# Patient Record
Sex: Female | Born: 1940
Health system: Southern US, Community
[De-identification: ages and names within clinical notes are randomized; demographics above are authoritative.]

## PROBLEM LIST (undated history)

## (undated) DIAGNOSIS — F32A Depression, unspecified: Secondary | ICD-10-CM

## (undated) DIAGNOSIS — G8929 Other chronic pain: Secondary | ICD-10-CM

## (undated) DIAGNOSIS — J449 Chronic obstructive pulmonary disease, unspecified: Secondary | ICD-10-CM

## (undated) DIAGNOSIS — I2510 Atherosclerotic heart disease of native coronary artery without angina pectoris: Secondary | ICD-10-CM

## (undated) DIAGNOSIS — M1990 Unspecified osteoarthritis, unspecified site: Secondary | ICD-10-CM

## (undated) DIAGNOSIS — Z973 Presence of spectacles and contact lenses: Secondary | ICD-10-CM

## (undated) DIAGNOSIS — Z974 Presence of external hearing-aid: Secondary | ICD-10-CM

## (undated) DIAGNOSIS — K219 Gastro-esophageal reflux disease without esophagitis: Secondary | ICD-10-CM

## (undated) DIAGNOSIS — U071 COVID-19: Secondary | ICD-10-CM

## (undated) DIAGNOSIS — C8590 Non-Hodgkin lymphoma, unspecified, unspecified site: Secondary | ICD-10-CM

## (undated) DIAGNOSIS — I4891 Unspecified atrial fibrillation: Secondary | ICD-10-CM

## (undated) DIAGNOSIS — Z972 Presence of dental prosthetic device (complete) (partial): Secondary | ICD-10-CM

## (undated) DIAGNOSIS — R591 Generalized enlarged lymph nodes: Secondary | ICD-10-CM

## (undated) DIAGNOSIS — I219 Acute myocardial infarction, unspecified: Secondary | ICD-10-CM

## (undated) DIAGNOSIS — I10 Essential (primary) hypertension: Secondary | ICD-10-CM

## (undated) DIAGNOSIS — E7800 Pure hypercholesterolemia, unspecified: Secondary | ICD-10-CM

## (undated) DIAGNOSIS — H409 Unspecified glaucoma: Secondary | ICD-10-CM

## (undated) DIAGNOSIS — M549 Dorsalgia, unspecified: Secondary | ICD-10-CM

## (undated) DIAGNOSIS — D649 Anemia, unspecified: Secondary | ICD-10-CM

## (undated) DIAGNOSIS — F419 Anxiety disorder, unspecified: Secondary | ICD-10-CM

## (undated) DIAGNOSIS — R7303 Prediabetes: Secondary | ICD-10-CM

## (undated) HISTORY — PX: CORONARY STENT PLACEMENT: SHX1402

## (undated) HISTORY — DX: COVID-19: U07.1

## (undated) HISTORY — PX: CATARACT EXTRACTION W/ INTRAOCULAR LENS IMPLANT: SHX1309

## (undated) HISTORY — PX: CARDIAC CATHETERIZATION: SHX172

## (undated) HISTORY — PX: COLONOSCOPY W/ BIOPSIES AND POLYPECTOMY: SHX1376

## (undated) HISTORY — PX: CHOLECYSTECTOMY: SHX55

## (undated) HISTORY — PX: SPINE SURGERY: SHX786

## (undated) NOTE — *Deleted (*Deleted)
HEMATOLOGY/ONCOLOGY CLINIC NOTE  Date of Service: 01/01/2019   Patient Care Team: Loren Racer, MD as PCP - General Physical medicine and rehabilitation: Merri Ray DO  CHIEF COMPLAINTS/PURPOSE OF CONSULTATION:  F/u for follicular lymphoma  INTERVAL HISTORY: Samantha [name] is here for management and evaluation of her follicular lymphoma. The patient's last visit with [place] was on 07/24/2019. The pt reports that she is doing well overall.  The pt reports ***  Of note since the patient's last visit, pt has had *** completed on *** with results revealing ***.  Lab results today (01/22/20) of CBC w/diff and CMP is as follows: all values are WNL except for ***. 01/25/2020 LDH at ***  On review of systems, pt reports *** and denies ***and any other symptoms.   A&P: -Discussed pt labwork today, 01/22/20; *** -***  MEDICAL HISTORY:  Past Medical History:  Diagnosis Date  . A-fib (HCC)   . Arthritis   . Chronic back pain   . COPD (chronic obstructive pulmonary disease) (HCC)   . Coronary artery disease   . Glaucoma   . Hearing aid worn   . High cholesterol   . Hypertension   . Lymphadenopathy 01/25/2016  . Myocardial infarction (HCC)    2000  . Non Hodgkin's lymphoma (HCC)   . Wears dentures    " Top plate"  . Wears glasses   #1 bilateral cataracts #2 COPD  #3 active smoker half a pack per day for about 60 years #4 degenerative arthritis - has had chronic back pain issues with multiple epidural steroid injections from 2014 [5 times] #5 glaucoma #6 degenerative arthritis #7 history of TIA #8 dyslipidemia #9 hypertension #10 coronary artery disease with inferior wall MI in the past, status post PCI to RCA in 2001 #11 hypothyroidism #12 lumbar radiculopathy/neuropathy. #13 paroxysmal atrial fibrillation on anticoagulation with Eliquis. #14 type 2 diabetes #16 obesity  SURGICAL HISTORY:  #1 cardiac catheterization #2 cholecystectomy #3 colonoscopy #4  wisdom teeth extraction.  SOCIAL HISTORY: Currently single. Notes that she lost her female partner 9 months ago. Active smoker half pack per day since her teens  FAMILY HISTORY: Patient reports no history of cancers or blood disorders in the family that she is aware of.  ALLERGIES:  is allergic to hydrocodone and oxycodone.  MEDICATIONS:  Current Outpatient Medications  Medication Sig Dispense Refill  . ALPRAZolam (XANAX) 0.25 MG tablet Take 0.25 mg by mouth daily as needed for anxiety.     Marland Kitchen amiodarone (PACERONE) 200 MG tablet Take 100 mg by mouth every morning.    Marland Kitchen amLODipine (NORVASC) 5 MG tablet Take 2.5 mg by mouth daily.    Marland Kitchen atorvastatin (LIPITOR) 40 MG tablet Take 40 mg by mouth every morning.     Marland Kitchen ELIQUIS 5 MG TABS tablet Take 5 mg by mouth 2 (two) times daily.    . fluticasone (FLONASE) 50 MCG/ACT nasal spray Place 2 sprays into both nostrils daily. 16 g 3  . gabapentin (NEURONTIN) 300 MG capsule Take 300 mg by mouth 2 (two) times daily as needed (neuropathy).     . metoprolol succinate (TOPROL-XL) 50 MG 24 hr tablet Take 50 mg by mouth daily.    . nitroGLYCERIN (NITROSTAT) 0.4 MG SL tablet Place 0.4 mg under the tongue every 5 (five) minutes x 3 doses as needed for chest pain.     . pantoprazole (PROTONIX) 40 MG tablet TAKE 1 TABLET BY MOUTH EVERY DAY 90 tablet 0  .  sertraline (ZOLOFT) 50 MG tablet TAKE 1 TABLET BY MOUTH EVERYDAY AT BEDTIME 90 tablet 3  . timolol (TIMOPTIC) 0.5 % ophthalmic solution Place 1 drop into both eyes 2 (two) times daily. Instill 1 drop in each eye in the morning and one drop in each eye at night    . traMADol (ULTRAM) 50 MG tablet Take 1-2 tablets (50-100 mg total) by mouth every 6 (six) hours as needed. 30 tablet 1   No current facility-administered medications for this visit.    REVIEW OF SYSTEMS:   A 10+ POINT REVIEW OF SYSTEMS WAS OBTAINED including neurology, dermatology, psychiatry, cardiac, respiratory, lymph, extremities, GI, GU,  Musculoskeletal, constitutional, breasts, reproductive, HEENT.  All pertinent positives are noted in the HPI.  All others are negative.   PHYSICAL EXAMINATION:  ECOG FS:2 - Symptomatic, <50% confined to bed  There were no vitals filed for this visit. Wt Readings from Last 3 Encounters:  07/24/19 216 lb 9.6 oz (98.2 kg)  01/01/19 211 lb 9.6 oz (96 kg)  09/01/18 213 lb 8 oz (96.8 kg)   There is no height or weight on file to calculate BMI.    *** GENERAL:alert, in no acute distress and comfortable SKIN: no acute rashes, no significant lesions EYES: conjunctiva are pink and non-injected, sclera anicteric OROPHARYNX: MMM, no exudates, no oropharyngeal erythema or ulceration NECK: supple, no JVD LYMPH:  no palpable lymphadenopathy in the cervical, axillary or inguinal regions LUNGS: clear to auscultation b/l with normal respiratory effort HEART: regular rate & rhythm ABDOMEN:  normoactive bowel sounds , non tender, not distended. No palpable hepatosplenomegaly.  Extremity: no pedal edema PSYCH: alert & oriented x 3 with fluent speech NEURO: no focal motor/sensory deficits  LABORATORY DATA:  I have reviewed the data as listed  . CBC Latest Ref Rng & Units 07/24/2019 01/01/2019 09/01/2018  WBC 4.0 - 10.5 K/uL 7.6 7.1 8.1  Hemoglobin 12.0 - 15.0 g/dL 45.4 09.8 11.9  Hematocrit 36 - 46 % 41.5 40.8 41.3  Platelets 150 - 400 K/uL 225 217 205   . CBC    Component Value Date/Time   WBC 7.6 07/24/2019 0933   RBC 4.32 07/24/2019 0933   HGB 13.0 07/24/2019 0933   HGB 13.9 02/18/2018 1116   HGB 12.7 03/04/2017 1349   HCT 41.5 07/24/2019 0933   HCT 39.8 03/04/2017 1349   PLT 225 07/24/2019 0933   PLT 196 02/18/2018 1116   PLT 180 03/04/2017 1349   MCV 96.1 07/24/2019 0933   MCV 99.0 03/04/2017 1349   MCH 30.1 07/24/2019 0933   MCHC 31.3 07/24/2019 0933   RDW 13.7 07/24/2019 0933   RDW 12.9 03/04/2017 1349   LYMPHSABS 1.6 07/24/2019 0933   LYMPHSABS 1.9 03/04/2017 1349   MONOABS  0.7 07/24/2019 0933   MONOABS 0.6 03/04/2017 1349   EOSABS 0.3 07/24/2019 0933   EOSABS 0.2 03/04/2017 1349   BASOSABS 0.1 07/24/2019 0933   BASOSABS 0.0 03/04/2017 1349    . CMP Latest Ref Rng & Units 07/24/2019 01/01/2019 09/01/2018  Glucose 70 - 99 mg/dL 147(W) 295(A) 88  BUN 8 - 23 mg/dL 9 10 9   Creatinine 0.44 - 1.00 mg/dL 2.13 0.86 5.78  Sodium 135 - 145 mmol/L 144 144 142  Potassium 3.5 - 5.1 mmol/L 4.3 3.9 4.5  Chloride 98 - 111 mmol/L 104 104 106  CO2 22 - 32 mmol/L 28 29 27   Calcium 8.9 - 10.3 mg/dL 8.9 9.1 4.6(N)  Total Protein 6.5 - 8.1 g/dL 6.7  6.7 6.6  Total Bilirubin 0.3 - 1.2 mg/dL 0.5 0.4 0.4  Alkaline Phos 38 - 126 U/L 69 80 73  AST 15 - 41 U/L 20 19 19   ALT 0 - 44 U/L 12 11 18    . Lab Results  Component Value Date   LDH 172 07/24/2019    .          RADIOGRAPHIC STUDIES: I have personally reviewed the radiological images as listed and agreed with the findings in the report. No results found.  PET/CT 12/22/2015  IMPRESSION: 1. Hypermetabolic confluent lower retroperitoneal / bilateral common iliac, right external iliac and right inguinal lymphadenopathy, consistent with lymphoma. 2. Solitary focus of osseous hypermetabolism in the left upper sacrum consistent with osseous involvement by lymphoma. 3. No metabolically active lymphoma in the neck or chest. Spleen is normal in size and metabolism. 4. Additional findings include aortic atherosclerosis, three-vessel coronary atherosclerosis and mild sigmoid diverticulosis.   Electronically Signed   By: Delbert Phenix M.D.   On: 12/22/2015 17:21   ASSESSMENT & PLAN:   48 y.o. Caucasian female with multiple chronic medical comorbidities as noted above with   #1 Stage IVB Low grade Follicular Lymphoma with extensive retroperitoneal lymphadenopathy and right inguinal lymphadenopathy as well as osseous involvement of the sacrum. LDH level within normal limits.  S/p 5 cycles of BR (Bendamustine  70mg /m2)  PET CT scan after 5 cycles of Bendamustine and Rituxan done on 09/06/2016 shows no residual metabolic active disease and no evidence of disease progression.  CT C/A/P on 05/20/17 reveals no radiographic evidence of disease progression.   02/19/18 CT C/A/P revealed Stable mild retroperitoneal adenopathy, compatible with treated lymphoma. No new or progressive adenopathy. No new sites of disease. Normal size spleen. 2. Three-vessel coronary atherosclerosis. 3. Aortic Atherosclerosis.  #2 Rt inguinal LN biopsy site infection and seroma -now resolved  #3 New persistent hoarseness/change in voice - being followed by ENT in [place]   PLAN: *** -No lab or clinical evidence of follicular lymphoma progression at this time. *** -F/u with PCP for heart burns/ acid suppressants- consider Gastroenterologist for endoscopy      FOLLOW UP: ***  The total time spent in the appt was *** minutes and more than 50% was on counseling and direct patient cares.  All of the patient's questions were answered with apparent satisfaction. The patient knows to call the clinic with any problems, questions or concerns.   Wyvonnia Lora MD MS AAHIVMS [facility] Hematology/Oncology Physician [facility]  (Office):       [phone] (Work cell):  [phone] (Fax):           [phone]  I, Carollee Herter, am acting as a scribe for Dr. Wyvonnia Lora.   {Add Production assistant, radio Statement}

---

## 2000-01-21 ENCOUNTER — Inpatient Hospital Stay (HOSPITAL_COMMUNITY): Admission: EM | Admit: 2000-01-21 | Discharge: 2000-01-24 | Payer: Self-pay | Admitting: Emergency Medicine

## 2000-01-21 ENCOUNTER — Encounter: Payer: Self-pay | Admitting: Cardiology

## 2000-01-21 ENCOUNTER — Encounter: Payer: Self-pay | Admitting: Cardiovascular Disease

## 2000-06-17 ENCOUNTER — Ambulatory Visit (HOSPITAL_COMMUNITY): Admission: RE | Admit: 2000-06-17 | Discharge: 2000-06-17 | Payer: Self-pay | Admitting: Cardiology

## 2000-06-17 ENCOUNTER — Encounter: Payer: Self-pay | Admitting: Cardiology

## 2000-06-20 ENCOUNTER — Ambulatory Visit (HOSPITAL_COMMUNITY): Admission: RE | Admit: 2000-06-20 | Discharge: 2000-06-20 | Payer: Self-pay | Admitting: Cardiology

## 2000-06-20 ENCOUNTER — Encounter: Payer: Self-pay | Admitting: Cardiovascular Disease

## 2000-11-25 ENCOUNTER — Encounter: Payer: Self-pay | Admitting: Family

## 2000-11-25 LAB — HM COLONOSCOPY

## 2001-01-08 ENCOUNTER — Ambulatory Visit (HOSPITAL_COMMUNITY): Admission: RE | Admit: 2001-01-08 | Discharge: 2001-01-08 | Payer: Self-pay | Admitting: Cardiology

## 2001-01-08 ENCOUNTER — Encounter: Payer: Self-pay | Admitting: Cardiology

## 2001-01-14 ENCOUNTER — Ambulatory Visit (HOSPITAL_COMMUNITY): Admission: RE | Admit: 2001-01-14 | Discharge: 2001-01-14 | Payer: Self-pay | Admitting: Cardiology

## 2001-01-14 ENCOUNTER — Encounter: Payer: Self-pay | Admitting: Cardiovascular Disease

## 2001-01-14 ENCOUNTER — Encounter: Payer: Self-pay | Admitting: Cardiology

## 2002-02-02 ENCOUNTER — Encounter: Payer: Self-pay | Admitting: Cardiology

## 2002-02-02 ENCOUNTER — Ambulatory Visit (HOSPITAL_COMMUNITY): Admission: RE | Admit: 2002-02-02 | Discharge: 2002-02-02 | Payer: Self-pay | Admitting: Cardiology

## 2002-06-17 ENCOUNTER — Ambulatory Visit (HOSPITAL_COMMUNITY): Admission: RE | Admit: 2002-06-17 | Discharge: 2002-06-17 | Payer: Self-pay | Admitting: Cardiology

## 2002-06-17 ENCOUNTER — Encounter: Payer: Self-pay | Admitting: Cardiology

## 2002-07-08 ENCOUNTER — Emergency Department (HOSPITAL_COMMUNITY): Admission: EM | Admit: 2002-07-08 | Discharge: 2002-07-08 | Payer: Self-pay | Admitting: Emergency Medicine

## 2004-10-12 ENCOUNTER — Observation Stay: Payer: Self-pay | Admitting: Internal Medicine

## 2004-10-12 ENCOUNTER — Other Ambulatory Visit: Payer: Self-pay

## 2004-11-29 ENCOUNTER — Ambulatory Visit: Payer: Self-pay | Admitting: Physician Assistant

## 2004-12-13 ENCOUNTER — Ambulatory Visit: Payer: Self-pay | Admitting: Internal Medicine

## 2004-12-21 ENCOUNTER — Ambulatory Visit: Payer: Self-pay | Admitting: Anesthesiology

## 2005-01-18 ENCOUNTER — Ambulatory Visit: Payer: Self-pay | Admitting: Anesthesiology

## 2005-02-15 ENCOUNTER — Ambulatory Visit: Payer: Self-pay | Admitting: Anesthesiology

## 2005-03-26 ENCOUNTER — Encounter: Payer: Self-pay | Admitting: Cardiovascular Disease

## 2005-03-26 ENCOUNTER — Ambulatory Visit (HOSPITAL_COMMUNITY): Admission: RE | Admit: 2005-03-26 | Discharge: 2005-03-26 | Payer: Self-pay | Admitting: Cardiology

## 2005-03-28 ENCOUNTER — Ambulatory Visit: Payer: Self-pay | Admitting: Anesthesiology

## 2005-05-15 ENCOUNTER — Ambulatory Visit: Payer: Self-pay | Admitting: Anesthesiology

## 2005-05-22 ENCOUNTER — Ambulatory Visit: Payer: Self-pay | Admitting: Anesthesiology

## 2005-06-20 ENCOUNTER — Ambulatory Visit: Payer: Self-pay | Admitting: Anesthesiology

## 2005-08-21 ENCOUNTER — Ambulatory Visit: Payer: Self-pay | Admitting: Anesthesiology

## 2005-11-07 ENCOUNTER — Ambulatory Visit: Payer: Self-pay | Admitting: Anesthesiology

## 2005-11-20 ENCOUNTER — Ambulatory Visit: Payer: Self-pay | Admitting: Anesthesiology

## 2005-12-11 ENCOUNTER — Ambulatory Visit: Payer: Self-pay | Admitting: Anesthesiology

## 2005-12-14 ENCOUNTER — Ambulatory Visit: Payer: Self-pay | Admitting: Internal Medicine

## 2006-04-18 ENCOUNTER — Ambulatory Visit: Payer: Self-pay | Admitting: Anesthesiology

## 2006-05-15 ENCOUNTER — Ambulatory Visit: Payer: Self-pay | Admitting: Anesthesiology

## 2006-10-01 ENCOUNTER — Ambulatory Visit: Payer: Self-pay | Admitting: Anesthesiology

## 2006-12-10 ENCOUNTER — Ambulatory Visit: Payer: Self-pay | Admitting: Anesthesiology

## 2006-12-17 ENCOUNTER — Ambulatory Visit: Payer: Self-pay | Admitting: Internal Medicine

## 2007-02-10 ENCOUNTER — Ambulatory Visit: Payer: Self-pay | Admitting: Anesthesiology

## 2007-05-06 ENCOUNTER — Ambulatory Visit: Payer: Self-pay | Admitting: Anesthesiology

## 2007-09-01 ENCOUNTER — Ambulatory Visit: Payer: Self-pay | Admitting: Anesthesiology

## 2007-11-28 ENCOUNTER — Ambulatory Visit: Payer: Self-pay | Admitting: Anesthesiology

## 2007-12-18 ENCOUNTER — Ambulatory Visit: Payer: Self-pay | Admitting: Internal Medicine

## 2008-01-22 ENCOUNTER — Ambulatory Visit: Payer: Self-pay | Admitting: Anesthesiology

## 2008-02-18 ENCOUNTER — Ambulatory Visit: Payer: Self-pay | Admitting: Anesthesiology

## 2008-03-29 ENCOUNTER — Ambulatory Visit: Payer: Self-pay | Admitting: Anesthesiology

## 2008-04-05 ENCOUNTER — Ambulatory Visit: Payer: Self-pay | Admitting: Pain Medicine

## 2008-04-28 ENCOUNTER — Ambulatory Visit: Payer: Self-pay | Admitting: Pain Medicine

## 2008-06-01 ENCOUNTER — Ambulatory Visit: Payer: Self-pay | Admitting: Physician Assistant

## 2008-07-08 ENCOUNTER — Ambulatory Visit: Payer: Self-pay | Admitting: Anesthesiology

## 2008-07-27 ENCOUNTER — Inpatient Hospital Stay (HOSPITAL_BASED_OUTPATIENT_CLINIC_OR_DEPARTMENT_OTHER): Admission: RE | Admit: 2008-07-27 | Discharge: 2008-07-27 | Payer: Self-pay | Admitting: Cardiology

## 2008-08-30 ENCOUNTER — Ambulatory Visit: Payer: Self-pay | Admitting: Anesthesiology

## 2008-11-03 ENCOUNTER — Ambulatory Visit: Payer: Self-pay | Admitting: Anesthesiology

## 2008-12-30 ENCOUNTER — Ambulatory Visit: Payer: Self-pay | Admitting: Anesthesiology

## 2009-01-24 ENCOUNTER — Ambulatory Visit: Payer: Self-pay | Admitting: Internal Medicine

## 2009-04-06 ENCOUNTER — Ambulatory Visit: Payer: Self-pay | Admitting: Anesthesiology

## 2009-06-17 ENCOUNTER — Ambulatory Visit: Payer: Self-pay | Admitting: Anesthesiology

## 2009-08-02 ENCOUNTER — Ambulatory Visit: Payer: Self-pay | Admitting: Unknown Physician Specialty

## 2010-01-25 ENCOUNTER — Ambulatory Visit: Payer: Self-pay | Admitting: Internal Medicine

## 2010-08-01 NOTE — Cardiovascular Report (Signed)
[name], RED                 ACCOUNT NO.:  [id number]   MEDICAL RECORD NO.:  [id number]          PATIENT TYPE:  OIB   LOCATION:  1965                         FACILITY:  [facility]   PHYSICIAN:  Mohan N. Sharyn Lull, M.D. DATE OF BIRTH:  10-12-1940   DATE OF PROCEDURE:  07/27/2008  DATE OF DISCHARGE:  07/27/2008                            CARDIAC CATHETERIZATION   PROCEDURE:  Left cardiac catheterization with selective left and right  coronary angiography, left ventriculography via right groin using  Judkins technique.   INDICATIONS FOR THE PROCEDURE:  [name] is a 70 year old white female  with past medical history significant for coronary artery disease,  history of inferoposterior wall MI, status post PTCA and stenting to RCA  in 2001, hypertension, hypercholesteremia, history of recurrent AFib,  history of tobacco abuse, morbid obesity, degenerative joint disease,  history of lumbar radiculopathy, complaints of retrosternal chest  heaviness off and on associated with shortness of breath and feeling  weak and tired and fatigued.  Denies any nausea, vomiting, or  diaphoresis.  Denies palpitation, lightheadedness, or syncope.  Denies  PND, orthopnea, or leg swelling.  Denies rest or nocturnal angina.  Denies chest pain at present.   PAST MEDICAL HISTORY:  As above.   PAST SURGICAL HISTORY:  None.   SOCIAL HISTORY:  She is single, retired, lives in [place].  Smoked 1  pack per day in the past.  No history of alcohol abuse.   ALLERGIES:  No known drug allergies.   MEDICATIONS:  At home, she is on:  1. Enteric-coated aspirin 81 mg p.o. daily.  2. Amiodarone 200 mg p.o. daily.  3. Toprol-XL 50 mg p.o. daily.  4. Lipitor 40 mg p.o. daily.  5. Nitrostat 0.4 mg sublingual p.r.n.   PHYSICAL EXAMINATION:  GENERAL:  She is alert, awake, and oriented x3,  in no acute distress.  VITAL SIGNS:  Blood pressure is 140/70, pulse was 56 and regular.  HEENT:  Conjunctivae pink.  NECK:   Supple.  No JVD.  No bruit.  LUNGS:  Clear to auscultation without rhonchi or rales.  CARDIOVASCULAR:  S1 and S2 were normal.  There were soft systolic and  diastolic murmurs.  There was no S3 gallop.  ABDOMEN:  Soft.  Bowel sounds are present.  Nontender.  EXTREMITIES:  There is no clubbing, cyanosis, or edema.   IMPRESSION:  1. New-onset angina, rule out progression of coronary artery disease.  2. Coronary artery disease.  3. History of inferoposterior wall myocardial infarction in the past      status post percutaneous coronary intervention to right coronary      artery.  4. Hypertension.  5. Hypercholesteremia.  6. Morbid obesity.  7. History of tobacco abuse.  8. History of recurrent atrial fibrillation in the past.   Discussed with the patient regarding noninvasive stress testing versus  left cath, its risks and benefits, i.e., death, MI, stroke, need for  emergency coronary artery bypass graft, local vascular complications,  etc., and consented for the catheterization.   PROCEDURE:  After obtaining informed consent, the patient was brought to  the  cath lab and was placed on fluoroscopy table.  The right groin was  prepped and draped in the usual fashion.  A 2% Xylocaine was used for  local anesthesia in the right groin.  With the help of thin wall needle,  a 4-French arterial sheath was placed.  The sheath was aspirated and  flushed.  Next, 4-French left Judkins catheter was advanced over the  wire under fluoroscopic guidance up to the ascending aorta.  Wire was  pulled out, the catheter was aspirated, and connected to the manifold.  Catheter was further advanced and engaged into left coronary ostium.  Multiple views of the left system were taken.  Next, catheter was  disengaged and was pulled out over the wire and was replaced with 4-  [place] 3-D right diagnostic catheter, which was advanced over the wire  under fluoroscopic guidance to the ascending aorta.  Wire was  pulled  out, the catheter was aspirated, and connected to the manifold.  Catheter was further advanced and engaged into right coronary ostium.  Multiple views of the right system were obtained.  Next, the catheter  was disengaged and was pulled out over the wire, and was replaced with 4-  French pigtail catheter, which was advanced over the wire under  fluoroscopic guidance up to the ascending aorta.  Wire was pulled out,  the catheter was aspirated and connected to the manifold.  Catheter was  further advanced across the aortic valve into the LV.  LV pressures were  recorded, and the LV gram was performed in 30-degree RAO position.  Post  angiographic pressures were recorded from LV and then pullback pressures  were recorded from the aorta.  There was no gradient across the aortic  valve.  Next, a pigtail catheter was pulled out over the wire.  Sheaths  were aspirated and flushed.   FINDINGS:  LV showed mild LVH, good LV systolic function, EF of 55-60%.  Left main was patent.  LAD has 10-15% ostial stenosis, which is more  apparent in LAO cranial view and 15-20% mid stenosis, distally the  vessel is diffusely diseased.  Diagonal 1 and 2 were very small,  diagonal III was small which was patent.  Left circumflex is patent.  OM-  1 and OM-2 were very very small, OM-3 was very small, which was patent,  OM-4 was small which was patent.  RCA has 15-20% mid in-stent  restenosis.  PDA is very very small.  PLV branches are small, which are  patent.  The patient tolerated the procedure well.  There were no  complications.  The patient was transferred to recovery room in stable  condition.      Eduardo Osier. Sharyn Lull, M.D.  Electronically Signed     MNH/MEDQ  D:  07/27/2008  T:  07/28/2008  Job:  [id number]

## 2010-08-04 NOTE — Cardiovascular Report (Signed)
. [facility]  Patient:    Samantha [name], Samantha [name] Visit Number: [id number] MRN: [id number]          Service Type: CAT Location: [facility] 2867 [id number] Attending Physician:  Robynn Pane Dictated by:   Eduardo Osier Sharyn Lull, M.D. Proc. Date: 01/14/01 Admit Date:  01/14/2001 Discharge Date: 01/14/2001                          Cardiac Catheterization  PROCEDURES: 1. Left cardiac catheterization with selective right and left    coronary angiography. 2. Left ventriculography via right groin using Judkins technique.  INDICATIONS FOR PROCEDURE: The patient is a 70 year old white female, with a past medical history significant for atherosclerotic heart disease, status post inferior wall MI in November of 2001, status post emergency PTCA and stenting to RCA, tobacco abuse, morbid obesity, arthritis, history of lumbar radiculopathy, complains of retrosternal chest pressure with minimal exertion, relieved with rest and sublingual nitroglycerin. Denies any nausea, vomiting, diaphoresis. Denies PND, orthopnea, or leg swelling. Denies palpitations, lightheadedness, or syncope. Denies rest or nocturnal angina.  Also complains of vague abdominal pain associated with food and was noted to have gallstones. The patient underwent Persantine Cardiolite on October 23. ECG post Persantine infusion showed a 1 mm ST depression in inferolateral leads, which was downsloping, which reverted to its baseline in the recovery phase. Cardiolite scan showed no evidence of ischemia with EF of 68%. Due to typical recurrent anginal pain and positive Persantine stress test by ECG criteria, the patient was advised for left catheterization, possible PCI.  PAST MEDICAL HISTORY: As above.  PAST SURGICAL HISTORY: None.  SOCIAL HISTORY: She is single, retired, lives in [place]. Smoked one pack-per-day.  No history of alcohol abuse. Recently restarted smoking.  MEDICATIONS: Toprol-XL 25 mg p.o. q.d.,  Altace 2.5 mg p.o. q.d., Enteric-coated aspirin 325 mg p.o. q.d., Zoloft 25 mg p.o. q.d., temazepam 15 mg p.o. h.s., Celebrex 200 mg p.o. q.d., Nitrostat sublingual p.r.n.  ALLERGIES: No known drug allergies.  FAMILY HISTORY: Negative for coronary artery disease.  PHYSICAL EXAMINATION:  VITAL SIGNS:  On examination, she is awake, alert and oriented x3 in no acute distress.  Blood pressure is 120/70, pulse was 70, regular.  HEENT:  Conjunctivae were pink.  NECK:  Supple, no JVD, no bruits.  LUNGS:  Lungs are clear to auscultation without rhonchi or rales.  CARDIAC:  Cardiovascular examination: S1 and S2 are normal. There was no S3 or gallop.  ABDOMEN:  Soft, bowel sounds are present, nontender.  EXTREMITIES:  There is no clubbing, cyanosis or edema.  IMPRESSION: 1. Accelerated anginal positive Persantine stress test by ECG    criteria. 2. Coronary artery disease.    a. Status post inferior posterior wall myocardial infarction.    b. Status post percutaneous transluminal coronary angioplasty and stenting       to right coronary artery in November 2001. 3. Hypertension. 4. Tobacco abuse. 5. Depression. 6. Osteoarthritis. 7. Morbid obesity. 8. Cholelithiasis.  INDICATIONS:  I discussed with patient regarding various options of treatment, i.e., medical versus invasive, its risks and benefits, i.e., death, MI, stroke, need for emergency CABG, risk of restenosis, local vascular complications, etc., and consented for the procedure.  DESCRIPTION OF PROCEDURE: After obtaining the informed consent, the patient was brought to the catheterization lab and was placed on the fluoroscopy table. The right groin was prepped and draped in the usual fashion. Xylocaine 2% was used for local anesthesia in  the right groin. With the help of a thin-walled needle, a 6 French arterial sheath was placed. The sheath was aspirated and flushed. Next, a 6 French left Judkins catheter was  advanced over the wire under fluoroscopic guidance up the ascending aorta.  The wire was pulled out. The catheter was aspirated and connected to the manifold. The catheter was further advanced and engaged into the left coronary ostium. Multiple views of the left system were taken. Next, the catheter was disengaged and was pulled out over the wire and was replaced with a 6 French right Judkins catheter which was advanced over the wire under fluoroscopic guidance up the ascending aorta. The wire was pulled out, the catheter was aspirated and connected to the manifold. The catheter was further advanced and engaged into the right coronary ostium. Multiple views of the right system were taken. Next, the catheter was disengaged and was pulled out over the wire and was replaced with a 6 French pigtail catheter which was advanced over the wire under fluoroscopic guidance up to the ascending aorta. The wire was pulled out, the catheter was aspirated and connected to the manifold.  The catheter was further advanced across the aortic valve into the LV. LV pressures were recorded.  Next, LV-gram was done in 30 degree RAO position.  Post angiographic pressures were recorded from LV and then pullback pressures were recorded from the aorta. There was no gradient across the aortic valve. Next, the pigtail catheter was pulled out over the wire. The sheaths were aspirated and flushed. Arteriotomy was closed with Perclose at the end of the procedure without complications. The patient tolerated the procedure well.  FINDINGS:  LEFT VENTRICULOGRAPHY: 1. Left ventricular showed good systolic function. 2. Ejection fraction of 60-65%.  SELECTIVE ARTERIOGRAPHY: 1. The left main was patent. 2. The LAD was patent. Diagonal #1 and diagonal #2 were small which were    patent. 3. The left circumflex was patent. OM-1 and OM-2 were patent. 4. The RCA has 10-15% in-stent restenosis in the midportion. Ostial RCA  has    also 10-15% stenosis.   The patient tolerated the procedure well. There were no complications. The patient was transferred to the recovery room in stable condition. Dictated by:   Eduardo Osier Sharyn Lull, M.D. Attending Physician:  Robynn Pane DD:  01/14/01 TD:  01/15/01 Job: [id number]

## 2010-08-04 NOTE — Cardiovascular Report (Signed)
[place]. [facility]  Patient:    Samantha [name], Samantha [name]                          MRN: [id number] Proc. Date: 06/20/00 Adm. Date:  [id number] Attending:  Robynn Pane CC:         [facility]   Cardiac Catheterization  PROCEDURE:  Left cardiac catheterization with selective left and right coronary angiographies and venography via right groin using Judkins technique.  INDICATION FOR PROCEDURE:  Ms. Grauberger is a 70 year old white female with past medical history significant for coronary artery disease, status post inferior wall MI, status post primary PTCA and stenting to mid-RCA in November of 2001, tobacco abuse, morbid obesity, arthritis, history of lumbar radiculopathy, complaining of exertional dyspnea and feeling weak and tried with minimal exertion.  Denies chest pain, nausea, vomiting, diaphoresis.  Denies palpitations, light-headedness or syncope.  Patient underwent Persantine Cardiolite on June 17, 2000, had minor ST depression in inferolateral leads, and Cardiolite scan showed reversible perfusion defect of inferior wall involving mid-to-basilar portion suggestive of ischemia with ejection fraction of 65%.  Due to new onset of exertional dyspnea, probably an anginal equivalent, and positive Persantine Cardiolite, patient was advised for left cath and possible PCI.  PROCEDURE:  After obtaining the informed consent, patient was brought to the cath lab and was placed on the fluoroscopy table.  Right groin was prepped and draped in the usual fashion.  Xylocaine 2% was used for local anesthesia in the right groin.  With the help of a thin-walled needle, 6-French arterial sheath was placed.  The sheath was aspirated and flushed.  Next, 6-French left Judkins catheter was advanced over the wire under fluoroscopic guidance up to the ascending aorta.  Wire was pulled out.  The catheter was aspirated and connected to the manifold.  Catheter was further  advanced and engaged into the left coronary ostium.  Multiple views of the left system were taken.  Next, the catheter was disengaged and was pulled out over the wire and was replaced with a 6-French right Judkins catheter, which was advanced over the wire under fluoroscopic guidance and positioned in the aorta.  Wire was pulled out.  The catheter was aspirated and connected to the manifold.  Catheter was further advanced and engaged into right coronary ostium.  Multiple views of the right system were taken.  Next, the catheter was disengaged and was pulled out over the wire and was replaced with a 6-French pigtail catheter, which was advanced over the wire under fluoroscopic guidance up to the ascending aorta.  Wire was pulled out.  The catheter was aspirated and connected to the manifold. Catheter was further advanced across the aortic valve into the LV.  LV pressures were recorded.  Next, left ventriculography was done in 30 degree RAO position.  Post-angiographic pressures were recorded from LV and then pullback pressures were recorded from the aorta.  There was no gradient across the aortic valve.  Next, the pigtail catheter was pulled out.  The sheaths were aspirated and flushed.  FINDINGS:  The patient has good LV systolic function.  Ejection fraction is 50-55%.  Left main was patent.  LAD was patent proximally and in mid-portion and then diffusely tapers down distally.  Left circumflex was patent.  OM-1 was patent.  RCA has 30-40% mid in-stent restenosis with TIMI grade 3 distal flow.  Patient tolerated procedure well.  There were no complications. Patient was  transferred to recovery room in stable condition.  Patient will be discharged home this afternoon if hemodynamically and volume stable. DD:  06/20/00 TD:  06/20/00 Job: [id number]

## 2010-08-04 NOTE — Cardiovascular Report (Signed)
[place]. [facility]  Patient:    Samantha [name], Samantha [name]                          MRN: [id number] Proc. Date: 01/21/00 Adm. Date:  [id number] Disc. Date: [id number] Attending:  Robynn Pane CC:         Cardiac Catheterization Laboratory   Cardiac Catheterization  PROCEDURE: 1. Emergency left cardiac catheterization with selective left and right    coronary angiography, left ventriculography via right groin. 2. Insertion of temporary transvenous pacemaker via right femoral approach. 3. Successful percutaneous transluminal coronary angioplasty to mid    right coronary artery using a 3.0 x 15 mm long CrossSail balloon. 4. Successful deployment of a 3.0 x 18 mm long Bx Velocity stent in mid    right coronary artery.  INDICATION FOR PROCEDURE:  [name] is a 70 year old white female with a past medical history significant for question of a lumbar radiculopathy, arthritis, tobacco abuse, morbid obesity.  She came to the ER via EMS complaining of retrosternal chest pressure radiating to the left arm and left arm numbness associated with diaphoresis, nausea which started while watching TV.  Her friend called EMS.  ECGs done ______ showed a acute inferior posterior wall injury pattern.  The patient received four baby aspirin by EMS, dropped her blood pressure in the 80s and heart rate in 30s requiring atropine and ______ challenge.  The patient complained of mild chest discomfort and left upper arm pain.  Denies prior history of angina.  Denies PND, orthopnea, leg swelling.  Denies palpitations, lightheadedness, or syncope.  PAST MEDICAL HISTORY:  As above.  PAST SURGICAL HISTORY:  None.  MEDICATIONS:  Ultram 50 mg p.o. b.i.d.  She is on prednisone and temazepam.  SOCIAL HISTORY:  She is single, retired, lives in [place].  Smokes one pack-per-day.  No history of alcohol abuse.  ECG done in the ER shows sinus bradycardia with first-degree A-V block,  ST elevation in lead II, III, aVF, ST depression in V1, V6 and ST depression in leads I and aVF, suggestive of acute inferior posterior wall injury pattern. Discussed with patient and her friend regarding emergency catheterization, possible PTCA and stenting, its risks and benefits, i.e., death, MI, stroke stroke, need for emergency CABG, local vascular complications, renal failure, risks of re-stenosis etc., and she consented for the procedure.  DESCRIPTION OF PROCEDURE:  After obtaining the informed consent, the patient was brought to the catheterization lab and was placed on the fluoroscopy table.  The right groin was prepped and draped in the usual fashion. Xylocaine 2% was used for local anesthesia in the right groin.  With the help of a thin-walled needle a 7 French arterial sheath and 6 French venous sheaths were placed.  Both of the sheaths were aspirated and flushed.  Next, a 6 French left Judkins catheter was advanced over the wire under fluoroscopic guidance up to the ascending aorta.  The wire was pulled out.  The catheter was aspirated and connected to the manifold.  The catheter was further advanced and engaged into the left coronary ostium.  Multiple views of the left system were taken.  Next, the catheter was disengaged and was pulled out over the wire and was replaced with 6 French right Judkins catheter which was advanced over the wire under fluoroscopic guidance up to the ascending aorta. The wire was pulled out.  The catheter was aspirated and connected to the manifold.  The catheter was further advanced and engaged into the right coronary ostium.  Multiple views of the right system were taken.  Next, the catheter was disengaged and was pulled out over the wire and was replaced with a 6 French pigtail catheter which was advanced over the wire under fluoroscopic guidance up to the ascending aorta.  The wire was pulled out. The catheter was aspirated and connected the  manifold.  The catheter was further advanced across the aortic valve into the LV.  LV pressures were recorded.  Next, left ventriculography was done in 30 degree RAO position. Post angiographic pressures were recorded from LV and pullback pressures were recorded from the aorta.  There was no gradient across the aortic valve. Next, the pigtail catheter was pulled out, sheaths were aspirated and flushed.   FINDINGS:  LEFT VENTRICULOGRAM:  The patient has inferobasal wall hypokinesia.  EF of 50-55%.  The left main was patent.  The LAD was patent.  Diagonal #1 and 2 was small which were patent.  Left circumflex was patent.  OM-1 and OM-2 were patent.  Dara Lords has 99% mid stenosis with haziness and with TIMI grade 3 distal flow.  Next, via right femoral venous approach ______ a transvenous pacemaker was inserted without any complications.  INTERVENTIONAL PROCEDURE:  Successful PTCA to mid RCA was done using a 3.0 x 15 mm long CrossSail balloon for predilatation and then 3.0 x 18 mm long Bx Velocity stent was deployed at 10 atmospheres which was fully expanded going up to 15 atmospheres.  The lesion was dilated from 99% to 0% ______ with excellent TIMI grade 3 distal flow without evidence of dissection of distal embolization.  The patient received weight-based heparin, ReoPro and 300 mg of Plavix during the procedure.  The patient also received 5 mg x 2 of IV Lopressor after the procedure.  A transvenous pacemaker was removed after the procedure.  The patient tolerated the procedure well.  There were no complications.  The patient was transferred to CCU in stable condition. DD:  01/24/00 TD:  01/25/00 Job: [id number]

## 2010-08-04 NOTE — Discharge Summary (Signed)
[place]. [facility]  Patient:    Samantha [name], Samantha [name]                          MRN: [id number] Adm. Date:  [id number] Disc. Date: [id number] Attending:  Rinaldo Cloud N                           Discharge Summary  ADMISSION DIAGNOSES: 1. Acute anteroposterior wall myocardial infarction. 2. Tobacco abuse. 3. Morbid obesity. 4. Arthritis. 5. Questionable history of lumbar radiculopathy.  FINAL DIAGNOSES: 1. Status post inferior wall myocardial infarction. 2. Status post emergency percutaneous transluminal coronary angioplasty and    stenting to right coronary artery. 3. Tobacco abuse. 4. Morbid obesity. 5. Arthritis. 6. Questionable lumbar radiculopathy.  DISCHARGE HOME MEDICATIONS: 1. Toprol XL 50 mg half of a tablet daily. 2. Altace 2.5 mg one capsule daily. 3. Enteric-coated aspirin 325 mg one tablet daily. 4. Plavix 75 mg one tablet daily with food for one month. 5. Protonix 40 mg one tablet daily for one month. 6. Nitrostat 0.4 mg sublingual, use as directed.  ACTIVITY:  Avoid heavy lifting, pushing, or pulling.  DIET:  Low salt, low cholesterol.  SPECIAL INSTRUCTIONS:  Post angioplasty and stent instructions have been given.  FOLLOW-UP:  Follow up with me in one week.  The patient will be scheduled for cardiac rehabilitation phase 2 as an outpatient.  BRIEF HISTORY:  Samantha [name] is a 70 year old white female with a past medical history significant for arthritis, questionable lumbar radiculopathy, tobacco abuse, and morbid obesity.  She came to the ER via EMS, complaining of retrosternal chest pressure radiating to the left arm and left arm numbness associated with diaphoresis and nausea while watching TV.  She called EMS.  An EKG done in the field showed an acute inferior wall injury pattern.  The patient received four baby aspirin and morphine sulfate in the field.  She dropped her blood pressure to the 80s and heart rate to the 30s,  requiring atropine and fluid challenge.  The patient complains of mild chest discomfort and left upper arm pain.  The patient denies a history of angina in the past. Denies PND, orthopnea, and leg swelling.  Denies palpitations, lightheadedness, or syncope.  No history of cough, fever, or chills.  PAST MEDICAL HISTORY:  As above.  PAST SURGICAL HISTORY:  None.  MEDICATIONS:  She was on Ultram 50 mg p.o. b.i.d., prednisone, and temazepam.  SOCIAL HISTORY:  She is single and retired.  [place]  She smokes one pack per day.  No history of alcohol abuse.  FAMILY HISTORY:  Negative for coronary artery disease.  ALLERGIES:  No known drug allergies.  REVIEW OF SYSTEMS:  Noncontributory.  PHYSICAL EXAMINATION:  She was alert, awake, and oriented x 2 and in no acute distress.  VITAL SIGNS:  The blood pressure after fluid challenge was 104/54 and the pulse was 52.  Sinus bradycardia on the monitor.  HEENT:  The conjunctivae were pink.  NECK:  Supple.  No JVD.  No bruit.  LUNGS:  Clear to auscultation without rhonchi or rales.  CARDIOVASCULAR:  S1 and S2 were normal.  There was a soft S4 gallop at the apex.  There was no S3 or murmurs.  ABDOMEN:  Soft.  Bowel sounds were present.  Obese and nontender.  EXTREMITIES:  There was no clubbing, cyanosis, or edema.  LABORATORY  DATA:  The EKG showed sinus bradycardia with first degree AV block, ST elevation in lead 2, 3, and aVF, ST depression in V1-V6, and ST depression in 1 and aVL, suggestive of acute inferoposterior wall injury pattern.  The chest x-ray showed minimal left basilar atelectasis.  The cholesterol was 159, triglycerides 134, HDL 31, and LDL 101.  Her first CK was 59 with an MB of 0.4 and troponin 0.01.  The second CPK was 396, MB 57.8, and relative index 14.6.  The third CPK was 134, MB 6.0, and relative index 4.5.  The second troponin was 8.76 and the third troponin was 3.40.   Her sodium was 143, potassium 3.6, chloride 104, bicarbonate 28, glucose 136, BUN 13, and creatinine 0.9.  Repeat electrolytes on January 23, 2000, showed sodium 141, potassium 3.9, chloride 107, bicarbonate 28, glucose 88, BUN 11, and creatinine 0.6.  The hemoglobin was 11.9, hematocrit 35.7, and white count 18.1.  On January 23, 2000, the hemoglobin remained stable at 11.8, hematocrit 34.6, and white count 18.6 with no shift to the left.  BRIEF HOSPITAL COURSE:  The patient was taken to the catheterization lab and had emergency left catheterization, PTCA, and stenting to RCA as per catheterization report.  The patient tolerated the procedure well.  There were no complications.  The patient was transferred to the CCU in stable condition. The patient did not have any episodes of chest pain during the hospital stay. Her sheaths were pulled out on the same day.  The patient has been ambulating in the hallway without any problems.  Phase 1 cardiac rehabilitation was called.  The patient tolerated ambulation without any problems.  The patients groin is stable.  There is no evidence of hematoma or bruit.  Her renal function is stable.  DISPOSITION:  The patient is eager to go home and will be discharged home today on the above medications and will be followed up in my office in one week.  We will schedule her for phase 2 cardiac rehabilitation as an outpatient. DD:  01/24/00 TD:  01/25/00 Job: [id number]

## 2010-11-27 ENCOUNTER — Ambulatory Visit: Payer: Self-pay | Admitting: Otolaryngology

## 2011-01-22 ENCOUNTER — Ambulatory Visit: Payer: Self-pay | Admitting: Unknown Physician Specialty

## 2011-01-24 LAB — PATHOLOGY REPORT

## 2011-01-29 ENCOUNTER — Ambulatory Visit: Payer: Self-pay | Admitting: Internal Medicine

## 2011-04-03 ENCOUNTER — Other Ambulatory Visit: Payer: Self-pay | Admitting: Cardiology

## 2012-01-30 ENCOUNTER — Ambulatory Visit: Payer: Self-pay

## 2013-01-30 ENCOUNTER — Ambulatory Visit: Payer: Self-pay

## 2013-07-20 ENCOUNTER — Other Ambulatory Visit (HOSPITAL_COMMUNITY): Payer: Self-pay | Admitting: Cardiology

## 2013-07-20 DIAGNOSIS — R079 Chest pain, unspecified: Secondary | ICD-10-CM

## 2013-08-05 ENCOUNTER — Encounter (HOSPITAL_COMMUNITY)
Admission: RE | Admit: 2013-08-05 | Discharge: 2013-08-05 | Disposition: A | Discharge status: Home / Self Care | Payer: Medicare Other | Source: Ambulatory Visit | Attending: Cardiology | Admitting: Cardiology

## 2013-08-05 ENCOUNTER — Encounter: Payer: Self-pay | Admitting: Cardiovascular Disease

## 2013-08-05 ENCOUNTER — Ambulatory Visit (HOSPITAL_COMMUNITY)
Admission: RE | Admit: 2013-08-05 | Discharge: 2013-08-05 | Disposition: A | Discharge status: Home / Self Care | Payer: Medicare Other | Source: Ambulatory Visit | Attending: Cardiology | Admitting: Cardiology

## 2013-08-05 ENCOUNTER — Other Ambulatory Visit: Payer: Self-pay

## 2013-08-05 DIAGNOSIS — I2510 Atherosclerotic heart disease of native coronary artery without angina pectoris: Secondary | ICD-10-CM | POA: Insufficient documentation

## 2013-08-05 DIAGNOSIS — R079 Chest pain, unspecified: Secondary | ICD-10-CM

## 2013-08-05 DIAGNOSIS — Z9861 Coronary angioplasty status: Secondary | ICD-10-CM | POA: Insufficient documentation

## 2013-08-05 MED ORDER — REGADENOSON 0.4 MG/5ML IV SOLN
0.4000 mg | Freq: Once | INTRAVENOUS | Status: AC
  Administered 2013-08-05: 0.4 mg via INTRAVENOUS

## 2013-08-05 MED ORDER — TECHNETIUM TC 99M SESTAMIBI GENERIC - CARDIOLITE
30.0000 | Freq: Once | INTRAVENOUS | Status: AC | PRN
  Administered 2013-08-05: 30 via INTRAVENOUS

## 2013-08-05 MED ORDER — TECHNETIUM TC 99M SESTAMIBI GENERIC - CARDIOLITE
10.0000 | Freq: Once | INTRAVENOUS | Status: AC | PRN
  Administered 2013-08-05: 10 via INTRAVENOUS

## 2013-08-05 MED ORDER — REGADENOSON 0.4 MG/5ML IV SOLN
INTRAVENOUS | Status: AC
  Administered 2013-08-05: 0.4 mg via INTRAVENOUS
  Filled 2013-08-05: qty 5

## 2015-05-31 DIAGNOSIS — M5136 Other intervertebral disc degeneration, lumbar region: Secondary | ICD-10-CM | POA: Insufficient documentation

## 2015-09-19 ENCOUNTER — Emergency Department (HOSPITAL_COMMUNITY): Payer: Medicare Other

## 2015-09-19 ENCOUNTER — Emergency Department (HOSPITAL_COMMUNITY)
Admission: EM | Admit: 2015-09-19 | Discharge: 2015-09-19 | Disposition: A | Discharge status: Home / Self Care | Payer: Medicare Other | Attending: Emergency Medicine | Admitting: Emergency Medicine

## 2015-09-19 ENCOUNTER — Encounter (HOSPITAL_COMMUNITY): Payer: Self-pay

## 2015-09-19 DIAGNOSIS — I2510 Atherosclerotic heart disease of native coronary artery without angina pectoris: Secondary | ICD-10-CM | POA: Insufficient documentation

## 2015-09-19 DIAGNOSIS — Z79899 Other long term (current) drug therapy: Secondary | ICD-10-CM | POA: Insufficient documentation

## 2015-09-19 DIAGNOSIS — Z7901 Long term (current) use of anticoagulants: Secondary | ICD-10-CM | POA: Diagnosis not present

## 2015-09-19 DIAGNOSIS — R0781 Pleurodynia: Secondary | ICD-10-CM | POA: Insufficient documentation

## 2015-09-19 DIAGNOSIS — I10 Essential (primary) hypertension: Secondary | ICD-10-CM | POA: Diagnosis not present

## 2015-09-19 DIAGNOSIS — R1012 Left upper quadrant pain: Secondary | ICD-10-CM | POA: Diagnosis present

## 2015-09-19 HISTORY — DX: Pure hypercholesterolemia, unspecified: E78.00

## 2015-09-19 HISTORY — DX: Atherosclerotic heart disease of native coronary artery without angina pectoris: I25.10

## 2015-09-19 HISTORY — DX: Essential (primary) hypertension: I10

## 2015-09-19 LAB — CBC
HCT: 42.5 % (ref 36.0–46.0)
HEMOGLOBIN: 13.5 g/dL (ref 12.0–15.0)
MCH: 31.1 pg (ref 26.0–34.0)
MCHC: 31.8 g/dL (ref 30.0–36.0)
MCV: 97.9 fL (ref 78.0–100.0)
PLATELETS: 149 10*3/uL — AB (ref 150–400)
RBC: 4.34 MIL/uL (ref 3.87–5.11)
RDW: 13.3 % (ref 11.5–15.5)
WBC: 6.9 10*3/uL (ref 4.0–10.5)

## 2015-09-19 LAB — COMPREHENSIVE METABOLIC PANEL
ALT: 20 U/L (ref 14–54)
ANION GAP: 8 (ref 5–15)
AST: 28 U/L (ref 15–41)
Albumin: 3.5 g/dL (ref 3.5–5.0)
Alkaline Phosphatase: 52 U/L (ref 38–126)
BUN: 8 mg/dL (ref 6–20)
CHLORIDE: 106 mmol/L (ref 101–111)
CO2: 25 mmol/L (ref 22–32)
CREATININE: 0.91 mg/dL (ref 0.44–1.00)
Calcium: 8.7 mg/dL — ABNORMAL LOW (ref 8.9–10.3)
Glucose, Bld: 170 mg/dL — ABNORMAL HIGH (ref 65–99)
Potassium: 3.7 mmol/L (ref 3.5–5.1)
SODIUM: 139 mmol/L (ref 135–145)
Total Bilirubin: 0.5 mg/dL (ref 0.3–1.2)
Total Protein: 6.2 g/dL — ABNORMAL LOW (ref 6.5–8.1)

## 2015-09-19 LAB — LIPASE, BLOOD: LIPASE: 32 U/L (ref 11–51)

## 2015-09-19 MED ORDER — ALBUTEROL SULFATE HFA 108 (90 BASE) MCG/ACT IN AERS
2.0000 | INHALATION_SPRAY | Freq: Once | RESPIRATORY_TRACT | Status: AC
  Administered 2015-09-19: 2 via RESPIRATORY_TRACT
  Filled 2015-09-19: qty 6.7

## 2015-09-19 MED ORDER — IBUPROFEN 800 MG PO TABS: 800.0000 mg | ORAL_TABLET | Freq: Three times a day (TID) | ORAL | Status: DC

## 2015-09-19 MED ORDER — ALBUTEROL SULFATE HFA 108 (90 BASE) MCG/ACT IN AERS: 2.0000 | INHALATION_SPRAY | RESPIRATORY_TRACT | Status: DC | PRN

## 2015-09-19 MED ORDER — DEXAMETHASONE 10 MG/ML FOR PEDIATRIC ORAL USE
16.0000 mg | Freq: Once | INTRAMUSCULAR | Status: AC
  Administered 2015-09-19: 16 mg via ORAL
  Filled 2015-09-19: qty 1.6

## 2015-09-19 NOTE — ED Notes (Signed)
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 No nausea, no vomiting, no diarrhea

## 2015-09-19 NOTE — ED Provider Notes (Signed)
CSN: [id number]     Arrival date & time 09/19/15  1346 History   First MD Initiated Contact with Patient 09/19/15 1529     Chief Complaint  Patient presents with  . LUQ pain      (Consider location/radiation/quality/duration/timing/severity/associated sxs/prior Treatment) HPI Comments: 75 year old female with history of coronary artery disease hypertension and hyperlipidemia. Likely also undiagnosed COPD is here at the request of her primary doctor for evaluation of left upper quadrant pain patient states that this started 3 weeks ago ago she was lifting something heavy and since then she's had a discomfort in her left lower rib cage. She states is not really super painful and is just uncomfortable and odd sensation. It's worse and she takes deep breath and worse with palpation. She specifically states she does not have any chest pain, nausea, vomiting, diaphoresis or dyspnea.   Past Medical History  Diagnosis Date  . High cholesterol   . Hypertension   . Coronary artery disease    History reviewed. No pertinent past surgical history. No family history on file. Social History  Substance Use Topics  . Smoking status: Never Smoker   . Smokeless tobacco: None  . Alcohol Use: None   OB History    No data available     Review of Systems  Respiratory: Positive for cough and wheezing.   Cardiovascular: Positive for chest pain (left lower costal margin uncomfortable, not pain, just feels her rib moving).  All other systems reviewed and are negative.     Allergies  Hydrocodone  Home Medications   Prior to Admission medications   Medication Sig Start Date End Date Taking? Authorizing Provider  ALPRAZolam (XANAX) 0.25 MG tablet Take 0.25 mg by mouth daily as needed.   Yes Historical Provider, MD  amiodarone (PACERONE) 200 MG tablet Take 100 mg by mouth every morning. 08/24/15  Yes Historical Provider, MD  atorvastatin (LIPITOR) 40 MG tablet Take 40 mg by mouth every evening. 08/24/15   Yes Historical Provider, MD  ELIQUIS 5 MG TABS tablet Take 5 mg by mouth 2 (two) times daily. 08/24/15  Yes Historical Provider, MD  gabapentin (NEURONTIN) 300 MG capsule Take 300 mg by mouth 2 (two) times daily. 08/24/15  Yes Historical Provider, MD  losartan (COZAAR) 50 MG tablet Take 50 mg by mouth every morning. 04/28/14  Yes Historical Provider, MD  Melatonin 5 MG TABS Take 5 mg by mouth at bedtime.   Yes Historical Provider, MD  meloxicam (MOBIC) 7.5 MG tablet Take 7.5 mg by mouth at bedtime. 01/20/14  Yes Historical Provider, MD  metoprolol succinate (TOPROL-XL) 50 MG 24 hr tablet Take 50 mg by mouth daily. 08/24/15  Yes Historical Provider, MD  nitroGLYCERIN (NITROSTAT) 0.4 MG SL tablet Place 0.4 mg under the tongue every 5 (five) minutes x 3 doses as needed.   Yes Historical Provider, MD  albuterol (PROVENTIL HFA;VENTOLIN HFA) 108 (90 Base) MCG/ACT inhaler Inhale 2 puffs into the lungs every 4 (four) hours as needed for wheezing or shortness of breath. 09/19/15   Merrily Pew, MD  ibuprofen (ADVIL,MOTRIN) 800 MG tablet Take 1 tablet (800 mg total) by mouth 3 (three) times daily. 09/19/15   Corene Cornea Tashima Scarpulla, MD   BP 139/59 mmHg  Pulse 60  Temp(Src) 98.3 F (36.8 C)  Resp 20  Ht 5\' 10"  (1.778 m)  Wt 212 lb (96.163 kg)  BMI 30.42 kg/m2  SpO2 93% Physical Exam  Constitutional: She appears well-developed and well-nourished.  HENT:  Head: Normocephalic and  atraumatic.  Neck: Normal range of motion.  Cardiovascular: Normal rate and regular rhythm.   Pulmonary/Chest: Effort normal. No stridor. Tachypnea noted. No respiratory distress. She has wheezes. She has no rales. She exhibits tenderness (left lower costal margin with palpable movement of ribs. ).  Slight hypoxia  Abdominal: She exhibits no distension.  Neurological: She is alert.  Nursing note and vitals reviewed.   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 Lungs clear. No pleural effusion or pneumothorax. Mild degenerative disc disease changes mid thoracic spine. IMPRESSION: No acute abnormalities. Electronically Signed   By: Lavonia Dana M.D.   On: 09/19/2015 14:54   Ct Chest Wo Contrast  09/19/2015  CLINICAL DATA:  Left upper quadrant pain for 3 weeks. Developed pain after lifting heavy object at work. Evaluate for left costochondral dislocation. EXAM: CT CHEST WITHOUT CONTRAST TECHNIQUE: Multidetector CT imaging of the chest was performed following the standard protocol without IV contrast. COMPARISON:  Chest x-ray 09/19/2015. FINDINGS: Cardiovascular: Aortic and coronary artery atherosclerotic calcifications. No evidence of aortic aneurysm. Heart is normal size. Mediastinum/Nodes: No mediastinal, hilar, or axillary adenopathy. Lungs/Pleura: Scarring posteriorly in the lung bases. No confluent airspace opacities or pleural effusions. No suspicious pulmonary nodules. Upper Abdomen: Prior cholecystectomy. No acute findings in the upper abdomen. Musculoskeletal: No evidence of costochondral dislocation. No evidence of rib fracture. Degenerative disc disease in the mid thoracic spine. IMPRESSION: No acute cardiopulmonary disease. Electronically Signed   By: Rolm Baptise M.D.   On: 09/19/2015 16:29   I have personally reviewed and evaluated these images and lab  results as part of my medical decision-making.   EKG Interpretation None      MDM   Final diagnoses:  Rib pain on left side   Suspect costochondral dislocation v rib fracture. Will ct to further evaluate and ensure no PTX or fracture.  Also with bronchitis, will tx w/ decadron and albuterol.   CT ok. Symptoms improved. Plan for dc with rest and NSAIDs. PCP follow up.  New Prescriptions: Discharge Medication List as of 09/19/2015  4:50 PM    START taking these medications   Details  albuterol (PROVENTIL HFA;VENTOLIN HFA) 108 (90 Base) MCG/ACT inhaler Inhale 2 puffs into the lungs every 4 (four) hours as needed for wheezing or shortness of breath., Starting 09/19/2015, Until Discontinued, Print    ibuprofen (ADVIL,MOTRIN) 800 MG tablet Take 1 tablet (800 mg total) by mouth 3 (three) times daily., Starting 09/19/2015, Until Discontinued, Print         I have personally and contemperaneously reviewed labs and imaging and used in my decision making as above.   A medical screening exam was performed and I feel the patient has had an appropriate workup for their chief complaint at this time  and likelihood of emergent condition existing is low and thus workup can continue on an outpatient basis.. Their vital signs are stable. They have been counseled on decision, discharge, follow up and which symptoms necessitate immediate return to the emergency department.  They verbally stated understanding and agreement with plan and discharged in stable condition.      Merrily Pew, MD 09/20/15 [phone]

## 2015-10-21 ENCOUNTER — Other Ambulatory Visit: Payer: Self-pay | Admitting: Physical Medicine and Rehabilitation

## 2015-10-21 DIAGNOSIS — M5136 Other intervertebral disc degeneration, lumbar region: Secondary | ICD-10-CM

## 2015-10-21 DIAGNOSIS — M5416 Radiculopathy, lumbar region: Secondary | ICD-10-CM

## 2015-10-21 DIAGNOSIS — M48062 Spinal stenosis, lumbar region with neurogenic claudication: Secondary | ICD-10-CM

## 2015-10-31 ENCOUNTER — Ambulatory Visit
Admission: RE | Admit: 2015-10-31 | Discharge: 2015-10-31 | Disposition: A | Discharge status: Home / Self Care | Payer: Medicare Other | Source: Ambulatory Visit | Attending: Physical Medicine and Rehabilitation | Admitting: Physical Medicine and Rehabilitation

## 2015-10-31 DIAGNOSIS — M4806 Spinal stenosis, lumbar region: Secondary | ICD-10-CM | POA: Diagnosis not present

## 2015-10-31 DIAGNOSIS — R590 Localized enlarged lymph nodes: Secondary | ICD-10-CM | POA: Insufficient documentation

## 2015-10-31 DIAGNOSIS — M5416 Radiculopathy, lumbar region: Secondary | ICD-10-CM | POA: Diagnosis not present

## 2015-10-31 DIAGNOSIS — M5127 Other intervertebral disc displacement, lumbosacral region: Secondary | ICD-10-CM | POA: Insufficient documentation

## 2015-10-31 DIAGNOSIS — M5126 Other intervertebral disc displacement, lumbar region: Secondary | ICD-10-CM | POA: Insufficient documentation

## 2015-10-31 DIAGNOSIS — M4696 Unspecified inflammatory spondylopathy, lumbar region: Secondary | ICD-10-CM | POA: Insufficient documentation

## 2015-10-31 DIAGNOSIS — M48062 Spinal stenosis, lumbar region with neurogenic claudication: Secondary | ICD-10-CM

## 2015-10-31 DIAGNOSIS — M5136 Other intervertebral disc degeneration, lumbar region: Secondary | ICD-10-CM | POA: Insufficient documentation

## 2015-11-08 ENCOUNTER — Other Ambulatory Visit: Payer: Self-pay | Admitting: Physical Medicine and Rehabilitation

## 2015-11-08 DIAGNOSIS — R590 Localized enlarged lymph nodes: Secondary | ICD-10-CM

## 2015-11-17 ENCOUNTER — Ambulatory Visit
Admission: RE | Admit: 2015-11-17 | Discharge: 2015-11-17 | Disposition: A | Discharge status: Home / Self Care | Payer: Medicare Other | Source: Ambulatory Visit | Attending: Physical Medicine and Rehabilitation | Admitting: Physical Medicine and Rehabilitation

## 2015-11-17 DIAGNOSIS — I700 Atherosclerosis of aorta: Secondary | ICD-10-CM | POA: Diagnosis not present

## 2015-11-17 DIAGNOSIS — C8590 Non-Hodgkin lymphoma, unspecified, unspecified site: Secondary | ICD-10-CM | POA: Diagnosis present

## 2015-11-17 DIAGNOSIS — K5730 Diverticulosis of large intestine without perforation or abscess without bleeding: Secondary | ICD-10-CM | POA: Diagnosis not present

## 2015-11-17 DIAGNOSIS — Z9049 Acquired absence of other specified parts of digestive tract: Secondary | ICD-10-CM | POA: Diagnosis not present

## 2015-11-17 DIAGNOSIS — R590 Localized enlarged lymph nodes: Secondary | ICD-10-CM | POA: Insufficient documentation

## 2015-11-17 LAB — POCT I-STAT CREATININE: Creatinine, Ser: 0.9 mg/dL (ref 0.44–1.00)

## 2015-11-17 MED ORDER — IOPAMIDOL (ISOVUE-300) INJECTION 61%
100.0000 mL | Freq: Once | INTRAVENOUS | Status: AC | PRN
  Administered 2015-11-17: 100 mL via INTRAVENOUS

## 2015-11-23 ENCOUNTER — Other Ambulatory Visit: Payer: Self-pay | Admitting: Cardiology

## 2015-11-23 DIAGNOSIS — R591 Generalized enlarged lymph nodes: Secondary | ICD-10-CM

## 2015-11-23 DIAGNOSIS — R599 Enlarged lymph nodes, unspecified: Secondary | ICD-10-CM

## 2015-11-29 ENCOUNTER — Other Ambulatory Visit: Payer: Self-pay | Admitting: Radiology

## 2015-11-29 ENCOUNTER — Other Ambulatory Visit: Payer: Self-pay | Admitting: General Surgery

## 2015-11-30 ENCOUNTER — Ambulatory Visit (HOSPITAL_COMMUNITY)
Admission: RE | Admit: 2015-11-30 | Discharge: 2015-11-30 | Disposition: A | Discharge status: Home / Self Care | Payer: Medicare Other | Source: Ambulatory Visit | Attending: Cardiology | Admitting: Cardiology

## 2015-11-30 ENCOUNTER — Encounter (HOSPITAL_COMMUNITY): Payer: Self-pay

## 2015-11-30 DIAGNOSIS — R599 Enlarged lymph nodes, unspecified: Secondary | ICD-10-CM

## 2015-11-30 DIAGNOSIS — R591 Generalized enlarged lymph nodes: Secondary | ICD-10-CM | POA: Insufficient documentation

## 2015-11-30 LAB — CBC
HEMATOCRIT: 43.1 % (ref 36.0–46.0)
Hemoglobin: 13.8 g/dL (ref 12.0–15.0)
MCH: 31.4 pg (ref 26.0–34.0)
MCHC: 32 g/dL (ref 30.0–36.0)
MCV: 98.2 fL (ref 78.0–100.0)
Platelets: 172 10*3/uL (ref 150–400)
RBC: 4.39 MIL/uL (ref 3.87–5.11)
RDW: 13.5 % (ref 11.5–15.5)
WBC: 9.4 10*3/uL (ref 4.0–10.5)

## 2015-11-30 LAB — APTT: APTT: 24 s (ref 24–36)

## 2015-11-30 LAB — PROTIME-INR
INR: 0.99
Prothrombin Time: 13.1 seconds (ref 11.4–15.2)

## 2015-11-30 MED ORDER — SODIUM CHLORIDE 0.9 % IV SOLN
INTRAVENOUS | Status: DC
  Administered 2015-11-30: 12:00:00 via INTRAVENOUS

## 2015-11-30 MED ORDER — LIDOCAINE HCL 1 % IJ SOLN
INTRAMUSCULAR | Status: AC
  Filled 2015-11-30: qty 20

## 2015-11-30 NOTE — H&P (Signed)
Chief Complaint: right inguinal lymphadenopathy  Referring Physician:Dr. Charolette Forward  Supervising Physician: Aletta Edouard  Patient Status:  Out-pt  HPI: Samantha [name] is an 75 y.o. female who underwent and MRI of her back in mid-August secondary to low back pain and aching in her legs.  She was incidentally found to have RTP and right iliac chain lymphadenopathy.  A CT of the abd/pel was then recommended.  This confirmed the LAD seen on MRI.  A request has been made for biopsy and the patient presents today for this procedure.  She does take eliquis.  She took her last dose on Sunday Morning.  Past Medical History:  Past Medical History:  Diagnosis Date  . Coronary artery disease   . High cholesterol   . Hypertension     Past Surgical History: History reviewed. No pertinent surgical history.  Family History: History reviewed. No pertinent family history.  Social History:  reports that she has never smoked. She does not have any smokeless tobacco history on file. Her alcohol and drug histories are not on file.  Allergies:  Allergies  Allergen Reactions  . Hydrocodone Rash    Itching, rash    Medications: Medications reviewed in Epic  Please HPI for pertinent positives, otherwise complete 10 system ROS negative, except she has had about a 20# weight loss that has been unintentional.  Mallampati Score: MD Evaluation Airway: WNL Heart: WNL Abdomen: WNL Chest/ Lungs: WNL ASA  Classification: 2 Mallampati/Airway Score: Two  Physical Exam: BP (!) 172/56   Pulse 60   Temp 97.6 F (36.4 C) (Oral)   Resp 18   Ht 5\' 10"  (1.778 m)   Wt 213 lb (96.6 kg)   SpO2 98%   BMI 30.56 kg/m  Body mass index is 30.56 kg/m. General: pleasant, WD, WN white female who is laying in bed in NAD HEENT: head is normocephalic, atraumatic.  Sclera are noninjected.  PERRL.  Ears and nose without any masses or lesions.  Mouth is pink and moist Heart: regular, rate, and rhythm.   Normal s1,s2. No obvious murmurs, gallops, or rubs noted.  Palpable radial and pedal pulses bilaterally Lungs: CTAB, no wheezes, rhonchi, or rales noted.  Respiratory effort nonlabored Abd: soft, NT, ND, +BS, no masses, hernias, or organomegaly MS: all 4 extremities are symmetrical with no cyanosis, clubbing, or edema. Psych: A&Ox3 with an appropriate affect.   Labs: Results for orders placed or performed during the hospital encounter of 11/30/15 (from the past 48 hour(s))  APTT upon arrival     Status: None   Collection Time: 11/30/15 11:35 AM  Result Value Ref Range   aPTT 24 24 - 36 seconds  CBC upon arrival     Status: None   Collection Time: 11/30/15 11:35 AM  Result Value Ref Range   WBC 9.4 4.0 - 10.5 K/uL   RBC 4.39 3.87 - 5.11 MIL/uL   Hemoglobin 13.8 12.0 - 15.0 g/dL   HCT 43.1 36.0 - 46.0 %   MCV 98.2 78.0 - 100.0 fL   MCH 31.4 26.0 - 34.0 pg   MCHC 32.0 30.0 - 36.0 g/dL   RDW 13.5 11.5 - 15.5 %   Platelets 172 150 - 400 K/uL  Protime-INR upon arrival     Status: None   Collection Time: 11/30/15 11:35 AM  Result Value Ref Range   Prothrombin Time 13.1 11.4 - 15.2 seconds   INR 0.99     Imaging: No results found.  Assessment/Plan  1. Right inguinal lymphadenopathy -we will proceed today with a biopsy of the most accessible LN which is in her right inguinal region.   -she has held her eliquis for a little over 48hrs. -labs and vitals have been reviewed -Risks and Benefits discussed with the patient including, but not limited to bleeding, infection, damage to adjacent structures or low yield requiring additional tests. All of the patient's questions were answered, patient is agreeable to proceed. Consent signed and in chart.  Thank you for this interesting consult.  I greatly enjoyed meeting MADY BREAUX and look forward to participating in their care.  A copy of this report was sent to the requesting provider on this date.  Electronically Signed: Henreitta Cea 11/30/2015, 12:43 PM   I spent a total of  30 Minutes   in face to face in clinical consultation, greater than 50% of which was counseling/coordinating care for right inguinal lymphadenopathy

## 2015-11-30 NOTE — Procedures (Signed)
Interventional Radiology Procedure Note  Procedure:  US guided core biopsy of right inguinal lymph node  Complications: None  Estimated Blood Loss: < 10 mL  Findings:  16 G core biopsy of right inguinal LN x 4.  Theda Payer T. Kathlene Cote, M.D Pager:  [phone]

## 2015-12-15 ENCOUNTER — Ambulatory Visit (HOSPITAL_BASED_OUTPATIENT_CLINIC_OR_DEPARTMENT_OTHER): Payer: Medicare Other

## 2015-12-15 ENCOUNTER — Telehealth: Payer: Self-pay | Admitting: Hematology

## 2015-12-15 ENCOUNTER — Encounter: Payer: Self-pay | Admitting: Hematology

## 2015-12-15 ENCOUNTER — Ambulatory Visit (HOSPITAL_BASED_OUTPATIENT_CLINIC_OR_DEPARTMENT_OTHER): Payer: Medicare Other | Admitting: Hematology

## 2015-12-15 VITALS — BP 139/51 | HR 58 | Temp 98.6°F | Resp 20 | Ht 70.0 in | Wt 214.2 lb

## 2015-12-15 DIAGNOSIS — I2510 Atherosclerotic heart disease of native coronary artery without angina pectoris: Secondary | ICD-10-CM

## 2015-12-15 DIAGNOSIS — R591 Generalized enlarged lymph nodes: Secondary | ICD-10-CM | POA: Diagnosis not present

## 2015-12-15 DIAGNOSIS — E119 Type 2 diabetes mellitus without complications: Secondary | ICD-10-CM

## 2015-12-15 DIAGNOSIS — E669 Obesity, unspecified: Secondary | ICD-10-CM

## 2015-12-15 DIAGNOSIS — I480 Paroxysmal atrial fibrillation: Secondary | ICD-10-CM

## 2015-12-15 DIAGNOSIS — J449 Chronic obstructive pulmonary disease, unspecified: Secondary | ICD-10-CM

## 2015-12-15 DIAGNOSIS — I252 Old myocardial infarction: Secondary | ICD-10-CM

## 2015-12-15 DIAGNOSIS — C8580 Other specified types of non-Hodgkin lymphoma, unspecified site: Secondary | ICD-10-CM

## 2015-12-15 DIAGNOSIS — I10 Essential (primary) hypertension: Secondary | ICD-10-CM | POA: Diagnosis not present

## 2015-12-15 DIAGNOSIS — R6883 Chills (without fever): Secondary | ICD-10-CM

## 2015-12-15 DIAGNOSIS — E039 Hypothyroidism, unspecified: Secondary | ICD-10-CM

## 2015-12-15 LAB — COMPREHENSIVE METABOLIC PANEL
ALBUMIN: 3.6 g/dL (ref 3.5–5.0)
ALK PHOS: 66 U/L (ref 40–150)
ALT: 22 U/L (ref 0–55)
ANION GAP: 9 meq/L (ref 3–11)
AST: 21 U/L (ref 5–34)
BILIRUBIN TOTAL: 0.49 mg/dL (ref 0.20–1.20)
BUN: 12.7 mg/dL (ref 7.0–26.0)
CO2: 27 mEq/L (ref 22–29)
Calcium: 9.3 mg/dL (ref 8.4–10.4)
Chloride: 106 mEq/L (ref 98–109)
Creatinine: 0.8 mg/dL (ref 0.6–1.1)
EGFR: 74 mL/min/{1.73_m2} — AB (ref >90)
Glucose: 87 mg/dl (ref 70–140)
Potassium: 4.6 mEq/L (ref 3.5–5.1)
Sodium: 142 mEq/L (ref 136–145)
TOTAL PROTEIN: 7.1 g/dL (ref 6.4–8.3)

## 2015-12-15 LAB — CBC & DIFF AND RETIC
BASO%: 0.4 % (ref 0.0–2.0)
Basophils Absolute: 0 10*3/uL (ref 0.0–0.1)
EOS%: 2.7 % (ref 0.0–7.0)
Eosinophils Absolute: 0.3 10*3/uL (ref 0.0–0.5)
HCT: 41.6 % (ref 34.8–46.6)
HEMOGLOBIN: 13.5 g/dL (ref 11.6–15.9)
IMMATURE RETIC FRACT: 5.1 % (ref 1.60–10.00)
LYMPH%: 37.3 % (ref 14.0–49.7)
MCH: 31.4 pg (ref 25.1–34.0)
MCHC: 32.5 g/dL (ref 31.5–36.0)
MCV: 96.7 fL (ref 79.5–101.0)
MONO#: 0.8 10*3/uL (ref 0.1–0.9)
MONO%: 7.9 % (ref 0.0–14.0)
NEUT#: 4.9 10*3/uL (ref 1.5–6.5)
NEUT%: 51.7 % (ref 38.4–76.8)
PLATELETS: 188 10*3/uL (ref 145–400)
RBC: 4.3 10*6/uL (ref 3.70–5.45)
RDW: 13.7 % (ref 11.2–14.5)
Retic %: 1.46 % (ref 0.70–2.10)
Retic Ct Abs: 62.78 10*3/uL (ref 33.70–90.70)
WBC: 9.5 10*3/uL (ref 3.9–10.3)
lymph#: 3.6 10*3/uL — ABNORMAL HIGH (ref 0.9–3.3)

## 2015-12-15 LAB — LACTATE DEHYDROGENASE: LDH: 212 U/L (ref 125–245)

## 2015-12-15 NOTE — Telephone Encounter (Signed)
Avs report and appointment schedule given to patient per 12/15/15 los.  ° °

## 2015-12-15 NOTE — Progress Notes (Signed)
HEMATOLOGY/ONCOLOGY CONSULTATION NOTE  Date of Service: 12/15/2015  Patient Care Team: Charolette Forward, MD as PCP - General (Cardiology) Physical medicine and rehabilitation: Sharlet Salina DO  CHIEF COMPLAINTS/PURPOSE OF CONSULTATION:  Newly diagnosed lymphoma  HISTORY OF PRESENTING ILLNESS:   Samantha [name] is a wonderful 75 y.o. female who has been referred to [place] by Dr .Charolette Forward, MD for evaluation and management of newly diagnosed lymphoma.   Patient has multiple medical comorbidities including history of hypertension, diabetes, dyslipidemia, coronary disease status post PCI in 2001, paroxysmal atrial fibrillation on anticoagulation and chronic low back pain due to degenerative degenerative disc disease with lumbar radiculopathy.  Patient notes increasing lower back pain for which she had an MRI of the lumbar spine without contrast on 10/31/2015 which showed retroperitoneal lymphadenopathy in addition to her degenerative disease. A CT of the abdomen and pelvis was done with contrast on 11/17/2015 and showed retroperitoneal lymphadenopathy with multiple nodes directly adjacent to the aorta and inferior vena cava. The adenopathy extends along the right common iliac chain as well as noted a right inguinal lymph node 12.5 mm in short axis.  Patient previously had a CT of the chest without contrast on 09/19/2015 to evaluate left lower chest wall pain which showed no evidence of lymphadenopathy or other acute cardiopulmonary disease.  Patient notes some chills at night and night sweats. Hasn't lost about 20 pounds of weight in the last 6 months down from 234 pounds to about 215 pounds. No overt fevers. Some increase in fatigue. Has been drinking ensure.  Patient subsequently had an ultrasound-guided biopsy of a right inguinal lymph node that shows atypical lymphoid proliferation and clonal B-cell population with kappa light chain excess. Overall picture is suggestive of follicular  lymphoma but was not definitive. The pathologist recommended excisional lymph node biopsy for definitive diagnosis.  Is accompanied by her friend and another family member for this clinic visit.  He had several questions which were answered in details.   MEDICAL HISTORY:  Past Medical History:  Diagnosis Date  . Coronary artery disease   . High cholesterol   . Hypertension   #1 bilateral cataracts #2 COPD  #3 active smoker half a pack per day for about 60 years #4 degenerative arthritis - has had chronic back pain issues with multiple epidural steroid injections from 2014 [5 times] #5 glaucoma #6 degenerative arthritis #7 history of TIA #8 dyslipidemia #9 hypertension #10 coronary artery disease with inferior wall MI in the past, status post PCI to RCA in 2001 #11 hypothyroidism #12 lumbar radiculopathy/neuropathy. #13 paroxysmal atrial fibrillation on anticoagulation with Eliquis. #14 type 2 diabetes #16 obesity  SURGICAL HISTORY:  #1 cardiac catheterization #2 cholecystectomy #3 colonoscopy #4 wisdom teeth extraction.  SOCIAL HISTORY: Currently single. Notes that she lost her female partner 9 months ago. Active smoker half pack per day since her teens  FAMILY HISTORY: Patient reports no history of cancers or blood disorders in the family that she is aware of.  ALLERGIES:  is allergic to hydrocodone.  MEDICATIONS:  Current Outpatient Prescriptions  Medication Sig Dispense Refill  . ALPRAZolam (XANAX) 0.25 MG tablet Take 0.25 mg by mouth daily as needed for anxiety.     Marland Kitchen amiodarone (PACERONE) 200 MG tablet Take 100 mg by mouth every morning.    Marland Kitchen atorvastatin (LIPITOR) 40 MG tablet Take 40 mg by mouth every morning.     Marland Kitchen ELIQUIS 5 MG TABS tablet Take 5 mg by mouth 2 (two) times  daily.    . gabapentin (NEURONTIN) 300 MG capsule Take 300 mg by mouth 2 (two) times daily.    Marland Kitchen ibuprofen (ADVIL,MOTRIN) 800 MG tablet Take 1 tablet (800 mg total) by mouth 3 (three) times  daily. (Patient taking differently: Take 800 mg by mouth daily as needed for moderate pain. ) 21 tablet 0  . losartan (COZAAR) 50 MG tablet Take 50 mg by mouth every morning.    . Melatonin 5 MG TABS Take 5 mg by mouth at bedtime.    . meloxicam (MOBIC) 7.5 MG tablet Take 7.5 mg by mouth at bedtime.    . metoprolol succinate (TOPROL-XL) 50 MG 24 hr tablet Take 50 mg by mouth daily.    . nitroGLYCERIN (NITROSTAT) 0.4 MG SL tablet Place 0.4 mg under the tongue every 5 (five) minutes x 3 doses as needed for chest pain.      No current facility-administered medications for this visit.     REVIEW OF SYSTEMS:    10 Point review of Systems was done is negative except as noted above.  PHYSICAL EXAMINATION: ECOG PERFORMANCE STATUS: 1 - Symptomatic but completely ambulatory  . Vitals:   12/15/15 1057  BP: (!) 139/51  Pulse: (!) 58  Resp: 20  Temp: 98.6 F (37 C)   Filed Weights   12/15/15 1057  Weight: 214 lb 3.2 oz (97.2 kg)   .Body mass index is 30.73 kg/m.  GENERAL:alert, NAD but anxious due to her new diagnosis. SKIN: skin color, texture, turgor are normal, no rashes or significant lesions EYES: normal, conjunctiva are pink and non-injected, sclera clear OROPHARYNX:no exudate, no erythema and lips, buccal mucosa, and tongue normal  NECK: supple, no JVD, thyroid normal size, non-tender, without nodularity LYMPH:  no palpable lymphadenopathy in the cervical, axillary. Noted to have bilateral palpable inguinal lymph nodes R>L LUNGS: clear to auscultation with normal respiratory effort HEART: regular rate & rhythm,  no murmurs and trace lower extremity edema ABDOMEN: abdomen soft, non-tender, normoactive bowel sounds, no palpable hepatosplenomegaly  Musculoskeletal: no cyanosis of digits and no clubbing  PSYCH: alert & oriented x 3 with fluent speech NEURO: no focal motor/sensory deficits  LABORATORY DATA:  I have reviewed the data as listed  . CBC Latest Ref Rng & Units  12/15/2015 11/30/2015 09/19/2015  WBC 3.9 - 10.3 10e3/uL 9.5 9.4 6.9  Hemoglobin 11.6 - 15.9 g/dL 13.5 13.8 13.5  Hematocrit 34.8 - 46.6 % 41.6 43.1 42.5  Platelets 145 - 400 10e3/uL 188 172 149(L)    . CMP Latest Ref Rng & Units 12/15/2015 11/17/2015 09/19/2015  Glucose 70 - 140 mg/dl 87 - 170(H)  BUN 7.0 - 26.0 mg/dL 12.7 - 8  Creatinine 0.6 - 1.1 mg/dL 0.8 0.90 0.91  Sodium 136 - 145 mEq/L 142 - 139  Potassium 3.5 - 5.1 mEq/L 4.6 - 3.7  Chloride 101 - 111 mmol/L - - 106  CO2 22 - 29 mEq/L 27 - 25  Calcium 8.4 - 10.4 mg/dL 9.3 - 8.7(L)  Total Protein 6.4 - 8.3 g/dL 7.1 - 6.2(L)  Total Bilirubin 0.20 - 1.20 mg/dL 0.49 - 0.5  Alkaline Phos 40 - 150 U/L 66 - 52  AST 5 - 34 U/L 21 - 28  ALT 0 - 55 U/L 22 - 20   . Lab Results  Component Value Date   LDH 212 12/15/2015          RADIOGRAPHIC STUDIES: I have personally reviewed the radiological images as listed and agreed with  the findings in the report. Ct Abdomen Pelvis W Contrast  Result Date: 11/17/2015 CLINICAL DATA:  Lumbar spine MRI demonstrated retroperitoneal lymphadenopathy. This is for further evaluation. Patient is asymptomatic. EXAM: CT ABDOMEN AND PELVIS WITH CONTRAST TECHNIQUE: Multidetector CT imaging of the abdomen and pelvis was performed using the standard protocol following bolus administration of intravenous contrast. CONTRAST:  141mL ISOVUE-300 IOPAMIDOL (ISOVUE-300) INJECTION 61% COMPARISON:  Lumbar MRI, 10/31/2015 FINDINGS: Lung bases: Minor subsegmental atelectasis and/or reticular scarring. Otherwise clear. Heart normal in size. Hepatobiliary: Normal liver. Gallbladder surgically absent. No bile duct dilation. Spleen, pancreas, adrenal glands:  Normal. Kidneys, ureters, bladder:  Normal. Uterus and adnexa:  Unremarkable. Lymph nodes: As noted on the recent MRI, there is retroperitoneal adenopathy, with multiple nodes directly adjacent to the aorta and inferior vena cava. Reference measurement of a left periaortic  node at the level of the inferior margin of the left kidney is 17 mm in short axis. Adenopathy extends along the right common iliac chain there is a right inguinal lymph node measures 12.5 mm in short axis. No other adenopathy. Ascites:  None. Vascular: Atherosclerotic calcifications noted along the aorta and iliac arteries. No aneurysm. Gastrointestinal: Stomach and small bowel are unremarkable. There are several diverticula of the sigmoid colon. No diverticulitis. Colon otherwise unremarkable. Normal appendix visualized. Musculoskeletal: Degenerative changes of the lumbar spine most evident at L4-L5. Mild arthropathic changes of the hips. No osteoblastic or osteolytic lesions. IMPRESSION: 1. Retroperitoneal adenopathy of unclear etiology. This may reflect lymphoma. Tissue sampling is warranted. 2. No acute findings within the chest, abdomen or pelvis. 3. Status post cholecystectomy. Several sigmoid colon diverticula without diverticulitis. 4. Aortic atherosclerosis. Electronically Signed   By: Lajean Manes M.D.   On: 11/17/2015 13:57   US Biopsy  Result Date: 11/30/2015 CLINICAL DATA:  Retroperitoneal lymphadenopathy in the abdomen and pelvis. CT also demonstrates an abnormal appearing lymph node in the medial right inguinal region. The patient presents for core biopsy of the right inguinal node. EXAM: ULTRASOUND GUIDED CORE BIOPSY OF RIGHT INGUINAL LYMPH NODE MEDICATIONS: None PROCEDURE: The procedure, risks, benefits, and alternatives were explained to the patient. Questions regarding the procedure were encouraged and answered. The patient understands and consents to the procedure. A time out was performed prior to initiating the procedure. The right groin was prepped with chlorhexidine in a sterile fashion, and a sterile drape was applied covering the operative field. A sterile gown and sterile gloves were used for the procedure. Local anesthesia was provided with 1% Lidocaine. Ultrasound was used to  localize an enlarged right inguinal lymph node. Under direct ultrasound guidance, 4 separate 16 gauge core biopsy samples were obtained. The samples were submitted in saline. COMPLICATIONS: None. FINDINGS: Elongated lymph node in the medial right inguinal region corresponds to the CT abnormality and measures approximately 2.8 cm in greatest length. This lymph node demonstrates abnormal morphology by ultrasound. Solid tissue was obtained. IMPRESSION: Ultrasound-guided core biopsy performed of an enlarged right inguinal lymph node. Electronically Signed   By: Aletta Edouard M.D.   On: 11/30/2015 14:03   MRI Lspine  10/31/2015 :   IMPRESSION: 1. At L4-5 there is a moderate broad-based disc bulge. Mild bilateral facet arthropathy with ligamentum flavum infolding. Severe spinal stenosis. Severe bilateral foraminal stenosis. 2. At L3-4 there is a broad-based disc bulge eccentric towards the right. Mild bilateral facet arthropathy. Bilateral lateral recess stenosis and mild spinal stenosis. Mild right foraminal stenosis. 3. At L2-3 there is a mild broad-based disc bulge flattening the  ventral thecal sac. Bilateral lateral recess stenosis. Bilateral mild facet arthropathy. Mild spinal stenosis. 4. At L5-S1 there is a left paracentral disc protrusion abutting the left intraspinal S1 nerve root. 5. Retroperitoneal lymphadenopathy of uncertain etiology. Differential considerations include an inflammatory or infectious etiology versus neoplastic etiology such as lymphoma. Recommend further evaluation with a CT of the abdomen/pelvis with intravenous contrast.   Electronically Signed   By: Kathreen Devoid   On: 10/31/2015 14:54   ASSESSMENT & PLAN:   75 year old Caucasian female with multiple chronic medical comorbidities as noted above with   #11 Newly noted extensive retroperitoneal lymphadenopathy and right inguinal lymphadenopathy. Biopsy is suggestive of follicular lymphoma though not  definitive as per the pathologist. LDH level within normal limits. CBC shows normal blood counts. Patient has some mild Type B symptoms with chills but no fevers and about a 20 pound unintentional weight loss in the last 6 months. PLan -I spent a significant period of time with the patient and her friend and relatives going over all the imaging and pathology findings and the likely diagnosis. -Had a series of questions and we talked about what a diagnosis of follicular lymphoma might mean once confirmed. -We discussed that the needle biopsy was quite suggestive of lymphoma that we would recommend getting a surgical excisional biopsy of the right inguinal lymph node to confirm the diagnosis. -She was given a referral to general surgery for an excisional biopsy of a right inguinal lymph node. -She would need to hold her request for 2 days prior to her excisional lymph node biopsy. -We will also set her up for a PET/CT scan to stage her lymphoma. -Final treatment decision would be based on the definitive diagnosis, staging and the patient's FILIPI score and her personal treatment preference.  #2 COPD  #3 active smoker half a pack per day for about 60 years #4 degenerative arthritis - has had chronic back pain issues with multiple epidural steroid injections from 2014 [5 times] #5 glaucoma #6 degenerative arthritis #7 history of TIA #8 dyslipidemia #9 hypertension #10 coronary artery disease with inferior wall MI in the past, status post PCI to RCA in 2001 #11 hypothyroidism #12 lumbar radiculopathy/neuropathy. #13 paroxysmal atrial fibrillation on anticoagulation with Eliquis. #14 type 2 diabetes #16 obesity Plan -Continue follow-up with primary care physician for continued management of all other medical comorbidities.   Return to clinic with Dr. Irene Limbo in 2-3 weeks with PET/CT scan and after excisional lymph node biopsy to determine specific treatment options.  All of the patients , her  friends and family members questions were answered to their apparent satisfaction. The patient knows to call the clinic with any problems, questions or concerns.  I spent 60 minutes counseling the patient face to face. The total time spent in the appointment was 70 minutes and more than 50% was on counseling and direct patient cares.    Sullivan Lone MD [place] AAHIVMS [facility] Hematology/Oncology Physician [facility]  (Office):       [phone] (Work cell):  [phone] (Fax):           [phone]  12/15/2015 11:47 AM

## 2015-12-22 ENCOUNTER — Encounter (HOSPITAL_COMMUNITY)
Admission: RE | Admit: 2015-12-22 | Discharge: 2015-12-22 | Disposition: A | Discharge status: Home / Self Care | Payer: Medicare Other | Source: Ambulatory Visit | Attending: Hematology | Admitting: Hematology

## 2015-12-22 DIAGNOSIS — C8580 Other specified types of non-Hodgkin lymphoma, unspecified site: Secondary | ICD-10-CM | POA: Insufficient documentation

## 2015-12-22 LAB — GLUCOSE, CAPILLARY: Glucose-Capillary: 149 mg/dL — ABNORMAL HIGH (ref 65–99)

## 2015-12-22 MED ORDER — FLUDEOXYGLUCOSE F - 18 (FDG) INJECTION: 10.7000 | Freq: Once | INTRAVENOUS | Status: DC | PRN

## 2015-12-26 ENCOUNTER — Telehealth: Payer: Self-pay | Admitting: *Deleted

## 2015-12-26 NOTE — Telephone Encounter (Signed)
"  I need to know the results of the PET scan I had last Thursday.  I'm going out of town this weekend."  advised provider will go over results on 01-05-2016 scheduled F/U.

## 2016-01-05 ENCOUNTER — Encounter: Payer: Self-pay | Admitting: Hematology

## 2016-01-05 ENCOUNTER — Ambulatory Visit (HOSPITAL_BASED_OUTPATIENT_CLINIC_OR_DEPARTMENT_OTHER): Payer: Medicare Other | Admitting: Hematology

## 2016-01-05 ENCOUNTER — Telehealth: Payer: Self-pay | Admitting: Hematology

## 2016-01-05 VITALS — BP 152/63 | HR 59 | Temp 98.1°F | Resp 18 | Ht 70.0 in | Wt 216.9 lb

## 2016-01-05 DIAGNOSIS — J449 Chronic obstructive pulmonary disease, unspecified: Secondary | ICD-10-CM | POA: Diagnosis not present

## 2016-01-05 DIAGNOSIS — C8598 Non-Hodgkin lymphoma, unspecified, lymph nodes of multiple sites: Secondary | ICD-10-CM

## 2016-01-05 DIAGNOSIS — E119 Type 2 diabetes mellitus without complications: Secondary | ICD-10-CM

## 2016-01-05 DIAGNOSIS — C8298 Follicular lymphoma, unspecified, lymph nodes of multiple sites: Secondary | ICD-10-CM

## 2016-01-05 DIAGNOSIS — E039 Hypothyroidism, unspecified: Secondary | ICD-10-CM

## 2016-01-05 DIAGNOSIS — Z7901 Long term (current) use of anticoagulants: Secondary | ICD-10-CM

## 2016-01-05 DIAGNOSIS — I10 Essential (primary) hypertension: Secondary | ICD-10-CM

## 2016-01-05 DIAGNOSIS — I2510 Atherosclerotic heart disease of native coronary artery without angina pectoris: Secondary | ICD-10-CM

## 2016-01-05 DIAGNOSIS — I480 Paroxysmal atrial fibrillation: Secondary | ICD-10-CM

## 2016-01-05 DIAGNOSIS — E669 Obesity, unspecified: Secondary | ICD-10-CM

## 2016-01-05 NOTE — Telephone Encounter (Signed)
S/W Santiago Glad at [facility] regarding an appointment. Patient is a new patient for their facilty and was scheduled as a Consultation per Santiago Glad. Appointment scheduled for 01/16/16 at 10:15a.m with Dr. Fanny Skates. Patient instructed to arrive at 9:45a.m. Patient was given address and appointment information.  [address]. Tel [phone]. Follow up appointment with Dr. Irene Limbo scheduled for 01/30/16. AVS report and appointment schedule given to patient, per 01/05/16 los.

## 2016-01-16 ENCOUNTER — Other Ambulatory Visit: Payer: Self-pay | Admitting: General Surgery

## 2016-01-23 ENCOUNTER — Encounter (HOSPITAL_COMMUNITY): Payer: Self-pay

## 2016-01-23 ENCOUNTER — Encounter (HOSPITAL_COMMUNITY)
Admission: RE | Admit: 2016-01-23 | Discharge: 2016-01-23 | Disposition: A | Discharge status: Home / Self Care | Payer: Medicare Other | Source: Ambulatory Visit | Attending: General Surgery | Admitting: General Surgery

## 2016-01-23 DIAGNOSIS — Z7901 Long term (current) use of anticoagulants: Secondary | ICD-10-CM | POA: Diagnosis not present

## 2016-01-23 DIAGNOSIS — E785 Hyperlipidemia, unspecified: Secondary | ICD-10-CM | POA: Diagnosis not present

## 2016-01-23 DIAGNOSIS — G8929 Other chronic pain: Secondary | ICD-10-CM | POA: Diagnosis not present

## 2016-01-23 DIAGNOSIS — I10 Essential (primary) hypertension: Secondary | ICD-10-CM | POA: Diagnosis not present

## 2016-01-23 DIAGNOSIS — Z9842 Cataract extraction status, left eye: Secondary | ICD-10-CM | POA: Diagnosis not present

## 2016-01-23 DIAGNOSIS — I480 Paroxysmal atrial fibrillation: Secondary | ICD-10-CM | POA: Diagnosis not present

## 2016-01-23 DIAGNOSIS — J449 Chronic obstructive pulmonary disease, unspecified: Secondary | ICD-10-CM | POA: Diagnosis not present

## 2016-01-23 DIAGNOSIS — M549 Dorsalgia, unspecified: Secondary | ICD-10-CM | POA: Diagnosis not present

## 2016-01-23 DIAGNOSIS — Z8249 Family history of ischemic heart disease and other diseases of the circulatory system: Secondary | ICD-10-CM | POA: Diagnosis not present

## 2016-01-23 DIAGNOSIS — Z79899 Other long term (current) drug therapy: Secondary | ICD-10-CM | POA: Diagnosis not present

## 2016-01-23 DIAGNOSIS — Z9841 Cataract extraction status, right eye: Secondary | ICD-10-CM | POA: Diagnosis not present

## 2016-01-23 DIAGNOSIS — Z823 Family history of stroke: Secondary | ICD-10-CM | POA: Diagnosis not present

## 2016-01-23 DIAGNOSIS — Z955 Presence of coronary angioplasty implant and graft: Secondary | ICD-10-CM | POA: Diagnosis not present

## 2016-01-23 DIAGNOSIS — Z885 Allergy status to narcotic agent status: Secondary | ICD-10-CM | POA: Diagnosis not present

## 2016-01-23 DIAGNOSIS — C8590 Non-Hodgkin lymphoma, unspecified, unspecified site: Secondary | ICD-10-CM | POA: Diagnosis present

## 2016-01-23 DIAGNOSIS — Z8261 Family history of arthritis: Secondary | ICD-10-CM | POA: Diagnosis not present

## 2016-01-23 DIAGNOSIS — C8295 Follicular lymphoma, unspecified, lymph nodes of inguinal region and lower limb: Secondary | ICD-10-CM | POA: Diagnosis not present

## 2016-01-23 DIAGNOSIS — I252 Old myocardial infarction: Secondary | ICD-10-CM | POA: Diagnosis not present

## 2016-01-23 HISTORY — DX: Presence of dental prosthetic device (complete) (partial): Z97.2

## 2016-01-23 HISTORY — DX: Acute myocardial infarction, unspecified: I21.9

## 2016-01-23 HISTORY — DX: Unspecified glaucoma: H40.9

## 2016-01-23 HISTORY — DX: Other chronic pain: G89.29

## 2016-01-23 HISTORY — DX: Presence of spectacles and contact lenses: Z97.3

## 2016-01-23 HISTORY — DX: Non-Hodgkin lymphoma, unspecified, unspecified site: C85.90

## 2016-01-23 HISTORY — DX: Presence of external hearing-aid: Z97.4

## 2016-01-23 HISTORY — DX: Chronic obstructive pulmonary disease, unspecified: J44.9

## 2016-01-23 HISTORY — DX: Other chronic pain: M54.9

## 2016-01-23 HISTORY — DX: Unspecified atrial fibrillation: I48.91

## 2016-01-23 HISTORY — DX: Unspecified osteoarthritis, unspecified site: M19.90

## 2016-01-23 LAB — CBC WITH DIFFERENTIAL/PLATELET
BASOS PCT: 1 %
Basophils Absolute: 0.1 10*3/uL (ref 0.0–0.1)
Eosinophils Absolute: 0.2 10*3/uL (ref 0.0–0.7)
Eosinophils Relative: 3 %
HEMATOCRIT: 41.6 % (ref 36.0–46.0)
HEMOGLOBIN: 13.2 g/dL (ref 12.0–15.0)
LYMPHS ABS: 3.5 10*3/uL (ref 0.7–4.0)
LYMPHS PCT: 36 %
MCH: 31 pg (ref 26.0–34.0)
MCHC: 31.7 g/dL (ref 30.0–36.0)
MCV: 97.7 fL (ref 78.0–100.0)
MONO ABS: 0.8 10*3/uL (ref 0.1–1.0)
MONOS PCT: 8 %
NEUTROS ABS: 5.2 10*3/uL (ref 1.7–7.7)
NEUTROS PCT: 52 %
Platelets: 175 10*3/uL (ref 150–400)
RBC: 4.26 MIL/uL (ref 3.87–5.11)
RDW: 13.6 % (ref 11.5–15.5)
WBC: 9.8 10*3/uL (ref 4.0–10.5)

## 2016-01-23 LAB — PROTIME-INR
INR: 1.07
PROTHROMBIN TIME: 13.9 s (ref 11.4–15.2)

## 2016-01-23 LAB — COMPREHENSIVE METABOLIC PANEL
ALBUMIN: 3.8 g/dL (ref 3.5–5.0)
ALK PHOS: 51 U/L (ref 38–126)
ALT: 17 U/L (ref 14–54)
ANION GAP: 8 (ref 5–15)
AST: 22 U/L (ref 15–41)
BILIRUBIN TOTAL: 0.5 mg/dL (ref 0.3–1.2)
BUN: 11 mg/dL (ref 6–20)
CALCIUM: 9 mg/dL (ref 8.9–10.3)
CO2: 28 mmol/L (ref 22–32)
Chloride: 107 mmol/L (ref 101–111)
Creatinine, Ser: 0.9 mg/dL (ref 0.44–1.00)
GFR calc Af Amer: >60 mL/min (ref >60)
GLUCOSE: 89 mg/dL (ref 65–99)
POTASSIUM: 4.2 mmol/L (ref 3.5–5.1)
Sodium: 143 mmol/L (ref 135–145)
TOTAL PROTEIN: 6.2 g/dL — AB (ref 6.5–8.1)

## 2016-01-23 LAB — APTT: aPTT: 32 seconds (ref 24–36)

## 2016-01-23 NOTE — Progress Notes (Signed)
Pt stated that her last dose of Eliquis was Saturday night as instructed.

## 2016-01-23 NOTE — Progress Notes (Signed)
Pt denies SOB and chest pain but is under the care of Dr. Terrence Dupont, Cardiology. Pt denies having any recent labs. Pt denies having an EKG within the last year. Pt chart forwarded to anesthesia to review cardiac history ( CAD with stent).

## 2016-01-23 NOTE — Pre-Procedure Instructions (Signed)
    Samantha [name]  01/23/2016      [id number] - [place] [place] - [address] [place] [address] Phone: [phone] Fax: [phone]    Your procedure is scheduled on Wednesday, January 25, 2016  Report to [facility] Admitting at 1:15 P.M.  Call this number if you have problems the morning of surgery:  [phone]   Remember:  Do not eat food or drink liquids after midnight Tuesday, Nov. 7, 2017  Take these medicines the morning of surgery with A SIP OF WATER : amiodarone (PACERONE), atorvastatin (LIPITOR), gabapentin (NEURONTIN), metoprolol succinate (TOPROL-XL), if needed: ALPRAZolam [name]) for anxiety, ARTIFICIAL TEARS for dry eye, Nitroglycerin for chest pain  Stop taking Aspirin, vitamins, fish oil and herbal medications ( Melatonin). Do not take any NSAIDs ie: Ibuprofen, Advil, Naproxen, BC and Goody Powder or any medication containing Aspirin such as meloxicam (MOBIC)  Do not wear jewelry, make-up or nail polish.  Do not wear lotions, powders, or perfumes, or deoderant.  Do not shave 48 hours prior to surgery.    Do not bring valuables to the hospital.  [facility] is not responsible for any belongings or valuables.  Contacts, dentures or bridgework may not be worn into surgery.  Leave your suitcase in the car.  After surgery it may be brought to your room.  For patients admitted to the hospital, discharge time will be determined by your treatment team.  Patients discharged the day of surgery will not be allowed to drive home.   Special instructions: Shower the night before surgery and the morning of surgery with CHG.  Please read over the following fact sheets that you were given. Pain Booklet, Coughing and Deep Breathing and Surgical Site Infection Prevention

## 2016-01-24 NOTE — H&P (Signed)
Samantha [name]. Samantha [name] Location: [facility] Surgery Patient #: [id number] DOB: 1941-02-01 Single / Language: Cleophus Molt / Race: White Female       History of Present Illness  The patient is a 75 year old female who presents with lymphadenopathy. This is a pleasant 75 year old Caucasian female from [place]. Here with her son to discuss right inguinal lymph node biopsy. She is referred by Dr. Irene Limbo at the cancer center and he is evaluating her for possible lymphoma. Dr. Reita Chard 1 he is her cardiologist.  She says she lost her partner one year ago. She had a 20 pound weight loss over the last 6 months. She says her appetite was decreased with this has stopped and she is not losing any more weight. She's had night sweats for 20 years. She has significant degenerative disc disease her back with some radiculopathy. Recent lumbar MRI showed retroperitoneal adenopathy. She had a CT scan of the abdomen and pelvis which showed retroperitoneal lymphadenopathy with multiple nodes adjacent to the aorta and inferior vena cava extending down the common iliacs, right common iliac chain. Also noted was a right inguinal lymph node 12.5 cm in short axis. CT scan of her chest in July showed no adenopathy. PET scan shows hypermetabolic lower retroperitoneal, bilateral common iliac, right external iliac, and right inguinal adenopathy. Solitary focus of osseous hypermetabolism left upper sacrum consistent with lymphoma. No activity in neck or chest. Spleen normal. Ultrasound-guided biopsy by radiology is suspicious but not diagnostic for lymphoma. Non-Hodgkin's lymphoma is suspected. She is referred for excision of the right inguinal lymph node. I have reviewed all of her imaging studies and she seems to have a solitary node in the right inguinal area that we should be able to localize with ultrasound in the operating room. I checked with the breast center and they do not do radioactive seed  localization.  Colitides include myocardial infarction, coronary stent placement, paroxysmal atrial fibrillation. He takes eloquent. Hyperlipidemia. Hypertension. COPD. Chronic low back pain from DDD. Next signs social history reveals she lost her partner 1 year ago. She does smoke.  She is willing to have the biopsy. She will be scheduled for excision deep right inguinal lymph node with ultrasound guidance. She wants me to do this at home because for heart disease. We will try to expedite this. I discussed the indications, details, techniques, and numerous risk of the surgery. I discussed the risk of bleeding, infection, nerve damage with chronic pain or numbness, injury to adjacent vascular structures, seroma or hematoma formation. She understands these issues well. This time all of her questions were answered. She agrees with this plan.   Other Problems  Arthritis Back Pain Chronic Obstructive Lung Disease  Past Surgical History  Cataract Surgery Bilateral. Sentinel Lymph Node Biopsy  Diagnostic Studies History  Colonoscopy 1-5 years ago Mammogram 1-3 years ago Pap Smear 1-5 years ago  Allergies  Hydrocodone-Acetaminophen *ANALGESICS - OPIOID* Oxycodone-Acetaminophen *ANALGESICS - OPIOID*  Medication History  Xanax (0.25MG  Tablet, Oral) Active. Amiodarone HCl (200MG  Tablet, Oral) Active. Atorvastatin Calcium (40MG  Tablet, Oral) Active. Eliquis (5MG  Tablet, Oral) Active. Gabapentin (300MG  Capsule, Oral) Active. Advil (200MG  Capsule, 800 mg Oral) Active. Losartan Potassium (50MG  Tablet, Oral) Active. Melatonin (5MG  Capsule, Oral) Active. Metoprolol Tartrate (50MG  Tablet, Oral) Active. Nitrostat (0.4MG  Tab Sublingual, Sublingual) Active. Medications Reconciled  Social History  Alcohol use Moderate alcohol use. Caffeine use Carbonated beverages, Coffee, Tea. Illicit drug use Recently quit drug use. Tobacco use Current every day  smoker.  Family History  Arthritis  Mother. Cerebrovascular Accident Mother. Heart Disease Father.  Pregnancy / Birth History  Age at menarche 29 years. Age of menopause <46 Gravida 0 Para 0    Review of Systems  General Present- Appetite Loss, Chills, Night Sweats and Weight Loss. Not Present- Fatigue, Fever and Weight Gain. Skin Not Present- Change in Wart/Mole, Dryness, Hives, Jaundice, New Lesions, Non-Healing Wounds, Rash and Ulcer. HEENT Present- Ringing in the Ears, Seasonal Allergies, Sinus Pain and Wears glasses/contact lenses. Not Present- Earache, Hearing Loss, Hoarseness, Nose Bleed, Oral Ulcers, Sore Throat, Visual Disturbances and Yellow Eyes. Respiratory Present- Snoring and Wheezing. Not Present- Bloody sputum, Chronic Cough and Difficulty Breathing. Cardiovascular Not Present- Chest Pain, Difficulty Breathing Lying Down, Leg Cramps, Palpitations, Rapid Heart Rate, Shortness of Breath and Swelling of Extremities. Gastrointestinal Present- Constipation, Excessive gas and Gets full quickly at meals. Not Present- Abdominal Pain, Bloating, Bloody Stool, Change in Bowel Habits, Chronic diarrhea, Difficulty Swallowing, Hemorrhoids, Indigestion, Nausea, Rectal Pain and Vomiting. Female Genitourinary Not Present- Frequency, Nocturia, Painful Urination, Pelvic Pain and Urgency. Musculoskeletal Present- Back Pain, Joint Pain, Joint Stiffness, Muscle Pain and Muscle Weakness. Not Present- Swelling of Extremities. Neurological Present- Numbness and Weakness. Not Present- Decreased Memory, Fainting, Headaches, Seizures, Tingling, Tremor and Trouble walking. Psychiatric Not Present- Anxiety, Bipolar, Change in Sleep Pattern, Depression, Fearful and Frequent crying. Endocrine Present- Hot flashes. Not Present- Cold Intolerance, Excessive Hunger, Hair Changes, Heat Intolerance and New Diabetes. Hematology Present- Blood Thinners and Easy Bruising. Not Present- Excessive bleeding,  Gland problems, HIV and Persistent Infections.  Vitals Weight: 215 lb Height: 70in Body Surface Area: 2.15 m Body Mass Index: 30.85 kg/m  Pulse: 77 (Regular)  BP: 136/88 (Sitting, Left Arm, Standard)     Physical Exam  General Mental Status-Alert. General Appearance-Consistent with stated age. Hydration-Well hydrated. Voice-Normal. Note: Odor of tobacco.   Head and Neck Head-normocephalic, atraumatic with no lesions or palpable masses. Trachea-midline. Thyroid Gland Characteristics - normal size and consistency.  Eye Eyeball - Bilateral-Extraocular movements intact. Sclera/Conjunctiva - Bilateral-No scleral icterus.  Chest and Lung Exam Chest and lung exam reveals -quiet, even and easy respiratory effort with no use of accessory muscles and on auscultation, normal breath sounds, no adventitious sounds and normal vocal resonance. Inspection Chest Wall - Normal. Back - normal.  Cardiovascular Cardiovascular examination reveals -normal heart sounds, regular rate and rhythm with no murmurs and normal pedal pulses bilaterally.  Abdomen Inspection Inspection of the abdomen reveals - No Hernias. Skin - Scar - no surgical scars. Palpation/Percussion Palpation and Percussion of the abdomen reveal - Soft, Non Tender, No Rebound tenderness, No Rigidity (guarding) and No hepatosplenomegaly. Auscultation Auscultation of the abdomen reveals - Bowel sounds normal.  Neurologic Neurologic evaluation reveals -alert and oriented x 3 with no impairment of recent or remote memory. Mental Status-Normal.  Musculoskeletal Normal Exam - Left-Upper Extremity Strength Normal and Lower Extremity Strength Normal. Normal Exam - Right-Upper Extremity Strength Normal and Lower Extremity Strength Normal.  Lymphatic Note: I did not feel any abnormal adenopathy in the neck or in the axilla. In the right groin there may be a small palpable lymph node. Not  as discrete. No skin change or tenderness. Also might palpate a small node in the left inguinal area. Neither of these are dramatic.     Assessment & Plan NON-HODGKIN'S LYMPHOMA IN ADULT (C85.90)  Your recent evaluation which included CT scans, PET scans, and a right inguinal lymph node biopsy shows diffuse adenopathy, or enlargement of the lymph nodes. Dr. Irene Limbo is suspicious  that you have non-Hodgkin's lymphoma The biopsy also suggest you may have lymphoma, but is not diagnostic, and Dr. Irene Limbo cannot make decisions yet  you will be scheduled for right inguinal lymph node biopsy with ultrasound guidance at [facility] in the near future. We will try to expedite this I discussed the indications, techniques, and numerous risk of the surgery with you and your son  We will ask Dr. Terrence Dupont for clearance You will need to stop her Eliquis 3 days preop  Please read the printed information about lymphoma that I gave you  BMI 30.0-30.9,ADULT (Z68.30) TOBACCO USER (Z72.0) HISTORY OF MYOCARDIAL INFARCTION (I25.2) Impression: History coronary stent placement COPD, MODERATE (J44.9) PAROXYSMAL ATRIAL FIBRILLATION (I48.0) Impression: Takes Eliquis DEGENERATIVE DISC DISEASE, LUMBAR (M51.36) Impression: Some radiculopathy. Recent MRI. WEIGHT LOSS, UNINTENTIONAL (R63.4) Impression: 20 pounds over 6 months    Hendrix Console M. Dalbert Batman, M.D., [facility] General and Minimally invasive Surgery Breast and Colorectal Surgery Office:   [phone] Pager:   [phone]

## 2016-01-24 NOTE — Progress Notes (Signed)
Anesthesia chart review: Patient is a 75 year old female scheduled for excision deep right inguinal lymph node biopsy with ultrasound on 01/25/2016 by Dr. Dalbert Batman.  History includes smoking, hypertension, MI/CAD s/p RCA strent '01, atrial fibrillation/PAF, hypercholesterolemia, non-Hodgkin's lymphoma (newly diagnosed), glaucoma, chronic back pain, COPD, cholecystectomy. Wears hearing and has upper dentures.  - Cardiologist is Dr. Terrence Dupont, last visit 12/21/15. No new testing and medication changes recommended.  - Oncologist is Dr. Sullivan Lone.  Meds include Eliquis (last dose 01/21/16), Xanax, amiodarone, Lipitor, Lumigan ophthalmic, Neurontin, losartan, melatonin, Toprol-XL, nitroglycerin.   01/23/16 EKG: SB at 57 bpm.  05/12/15 Echo (Dr. Terrence Dupont): Summary: Left ventricular systolic function is normal with normal ejection fraction of 55-60%. The left ventricle shows normal contractility in size. Mild aortic regurgitation. Trace tricuspid regurgitation. No intracardiac shunting. No intracardiac masses or thrombi seen. No pericardial effusion seen.  08/05/13 Nuclear stress test: IMPRESSION: 1. Small relatively matched area of attenuation involving the inferior wall of the left ventricle without associated regional wall motion abnormality. No definite scintigraphic evidence of prior infarction or pharmacologically induced ischemia. 2. Normal wall motion. Ejection fraction - 54%. (Previous ejection fraction - 68%).  07/27/08 Cardiac cath: FINDINGS:   LV showed mild LVH, good LV systolic function, EF of 0000000. Left main was patent.  LAD has 10-15% ostial stenosis, which is more apparent in LAO cranial view and 15-20% mid stenosis, distally the vessel is diffusely diseased.  Diagonal 1 and 2 were very small, diagonal III was small which was patent.  Left circumflex is patent.  OM-1 and OM-2 were very very small, OM-3 was very small, which was patent, OM-4 was small which was patent.  RCA has 15-20% mid  in-stent restenosis.  PDA is very very small.  PLV branches are small, which are patent.   Preoperative labs noted.   She has had recent cardiology evaluation. If no acute changes or new symptoms then I would anticipate that she can proceed as planned.  George Hugh [facility] Short Stay Center/Anesthesiology Phone [phone] 01/24/2016 1:08 PM

## 2016-01-25 ENCOUNTER — Ambulatory Visit (HOSPITAL_COMMUNITY): Payer: Medicare Other | Admitting: Vascular Surgery

## 2016-01-25 ENCOUNTER — Encounter (HOSPITAL_COMMUNITY)
Admission: RE | Disposition: A | Discharge status: Home / Self Care | Payer: Self-pay | Source: Ambulatory Visit | Attending: General Surgery

## 2016-01-25 ENCOUNTER — Encounter (HOSPITAL_COMMUNITY): Payer: Self-pay | Admitting: Surgery

## 2016-01-25 ENCOUNTER — Ambulatory Visit (HOSPITAL_COMMUNITY)
Admission: RE | Admit: 2016-01-25 | Discharge: 2016-01-25 | Disposition: A | Discharge status: Home / Self Care | Payer: Medicare Other | Source: Ambulatory Visit | Attending: General Surgery | Admitting: General Surgery

## 2016-01-25 ENCOUNTER — Ambulatory Visit (HOSPITAL_COMMUNITY): Payer: Medicare Other | Admitting: Anesthesiology

## 2016-01-25 DIAGNOSIS — Z823 Family history of stroke: Secondary | ICD-10-CM | POA: Insufficient documentation

## 2016-01-25 DIAGNOSIS — Z8249 Family history of ischemic heart disease and other diseases of the circulatory system: Secondary | ICD-10-CM | POA: Insufficient documentation

## 2016-01-25 DIAGNOSIS — C8295 Follicular lymphoma, unspecified, lymph nodes of inguinal region and lower limb: Secondary | ICD-10-CM | POA: Diagnosis not present

## 2016-01-25 DIAGNOSIS — J449 Chronic obstructive pulmonary disease, unspecified: Secondary | ICD-10-CM | POA: Insufficient documentation

## 2016-01-25 DIAGNOSIS — G8929 Other chronic pain: Secondary | ICD-10-CM | POA: Insufficient documentation

## 2016-01-25 DIAGNOSIS — I480 Paroxysmal atrial fibrillation: Secondary | ICD-10-CM | POA: Diagnosis not present

## 2016-01-25 DIAGNOSIS — R591 Generalized enlarged lymph nodes: Secondary | ICD-10-CM | POA: Diagnosis present

## 2016-01-25 DIAGNOSIS — I252 Old myocardial infarction: Secondary | ICD-10-CM | POA: Diagnosis not present

## 2016-01-25 DIAGNOSIS — Z7901 Long term (current) use of anticoagulants: Secondary | ICD-10-CM | POA: Insufficient documentation

## 2016-01-25 DIAGNOSIS — M549 Dorsalgia, unspecified: Secondary | ICD-10-CM | POA: Insufficient documentation

## 2016-01-25 DIAGNOSIS — E785 Hyperlipidemia, unspecified: Secondary | ICD-10-CM | POA: Insufficient documentation

## 2016-01-25 DIAGNOSIS — Z885 Allergy status to narcotic agent status: Secondary | ICD-10-CM | POA: Insufficient documentation

## 2016-01-25 DIAGNOSIS — Z9842 Cataract extraction status, left eye: Secondary | ICD-10-CM | POA: Insufficient documentation

## 2016-01-25 DIAGNOSIS — Z955 Presence of coronary angioplasty implant and graft: Secondary | ICD-10-CM | POA: Diagnosis not present

## 2016-01-25 DIAGNOSIS — Z79899 Other long term (current) drug therapy: Secondary | ICD-10-CM | POA: Insufficient documentation

## 2016-01-25 DIAGNOSIS — I10 Essential (primary) hypertension: Secondary | ICD-10-CM | POA: Insufficient documentation

## 2016-01-25 DIAGNOSIS — Z8261 Family history of arthritis: Secondary | ICD-10-CM | POA: Insufficient documentation

## 2016-01-25 DIAGNOSIS — Z9841 Cataract extraction status, right eye: Secondary | ICD-10-CM | POA: Insufficient documentation

## 2016-01-25 HISTORY — DX: Generalized enlarged lymph nodes: R59.1

## 2016-01-25 HISTORY — PX: INGUINAL LYMPH NODE BIOPSY: SHX5865

## 2016-01-25 SURGERY — BIOPSY, LYMPH NODE, INGUINAL, OPEN
Anesthesia: General | Site: Groin | Laterality: Right

## 2016-01-25 MED ORDER — CELECOXIB 200 MG PO CAPS
400.0000 mg | ORAL_CAPSULE | ORAL | Status: AC
  Administered 2016-01-25: 400 mg via ORAL

## 2016-01-25 MED ORDER — HYDROMORPHONE HCL 1 MG/ML IJ SOLN: 0.2500 mg | INTRAMUSCULAR | Status: DC | PRN

## 2016-01-25 MED ORDER — PROPOFOL 10 MG/ML IV BOLUS
INTRAVENOUS | Status: AC
  Filled 2016-01-25: qty 20

## 2016-01-25 MED ORDER — ONDANSETRON HCL 4 MG/2ML IJ SOLN
INTRAMUSCULAR | Status: DC | PRN
Start: 2016-01-25 — End: 2016-01-25
  Administered 2016-01-25: 4 mg via INTRAVENOUS

## 2016-01-25 MED ORDER — LACTATED RINGERS IV SOLN
INTRAVENOUS | Status: DC
  Administered 2016-01-25 (×2): via INTRAVENOUS

## 2016-01-25 MED ORDER — GLYCOPYRROLATE 0.2 MG/ML IJ SOLN
INTRAMUSCULAR | Status: DC | PRN
  Administered 2016-01-25: 0.2 mg via INTRAVENOUS

## 2016-01-25 MED ORDER — PROPOFOL 10 MG/ML IV BOLUS
INTRAVENOUS | Status: DC | PRN
  Administered 2016-01-25: 100 mg via INTRAVENOUS

## 2016-01-25 MED ORDER — CELECOXIB 200 MG PO CAPS
ORAL_CAPSULE | ORAL | Status: AC
  Administered 2016-01-25: 400 mg via ORAL
  Filled 2016-01-25: qty 2

## 2016-01-25 MED ORDER — TRAMADOL HCL 50 MG PO TABS: 50.0000 mg | ORAL_TABLET | Freq: Four times a day (QID) | ORAL | 1 refills | Status: DC | PRN

## 2016-01-25 MED ORDER — ACETAMINOPHEN 500 MG PO TABS
1000.0000 mg | ORAL_TABLET | ORAL | Status: AC
  Administered 2016-01-25: 1000 mg via ORAL
  Filled 2016-01-25: qty 2

## 2016-01-25 MED ORDER — BUPIVACAINE HCL (PF) 0.25 % IJ SOLN
INTRAMUSCULAR | Status: DC | PRN
  Administered 2016-01-25: 3 mL

## 2016-01-25 MED ORDER — LIDOCAINE HCL (CARDIAC) 20 MG/ML IV SOLN
INTRAVENOUS | Status: DC | PRN
  Administered 2016-01-25: 60 mg via INTRAVENOUS

## 2016-01-25 MED ORDER — EPHEDRINE 5 MG/ML INJ
INTRAVENOUS | Status: AC
  Filled 2016-01-25: qty 10

## 2016-01-25 MED ORDER — ONDANSETRON HCL 4 MG/2ML IJ SOLN
INTRAMUSCULAR | Status: AC
  Filled 2016-01-25: qty 2

## 2016-01-25 MED ORDER — FENTANYL CITRATE (PF) 100 MCG/2ML IJ SOLN
INTRAMUSCULAR | Status: AC
  Filled 2016-01-25: qty 2

## 2016-01-25 MED ORDER — PHENYLEPHRINE 40 MCG/ML (10ML) SYRINGE FOR IV PUSH (FOR BLOOD PRESSURE SUPPORT)
PREFILLED_SYRINGE | INTRAVENOUS | Status: AC
  Filled 2016-01-25: qty 10

## 2016-01-25 MED ORDER — PROMETHAZINE HCL 25 MG/ML IJ SOLN: 6.2500 mg | INTRAMUSCULAR | Status: DC | PRN

## 2016-01-25 MED ORDER — EPHEDRINE SULFATE 50 MG/ML IJ SOLN
INTRAMUSCULAR | Status: DC | PRN
  Administered 2016-01-25 (×2): 10 mg via INTRAVENOUS

## 2016-01-25 MED ORDER — CEFAZOLIN SODIUM-DEXTROSE 2-4 GM/100ML-% IV SOLN
2.0000 g | INTRAVENOUS | Status: AC
  Administered 2016-01-25: 2 g via INTRAVENOUS

## 2016-01-25 MED ORDER — CEFAZOLIN SODIUM-DEXTROSE 2-4 GM/100ML-% IV SOLN
INTRAVENOUS | Status: AC
  Filled 2016-01-25: qty 100

## 2016-01-25 MED ORDER — FENTANYL CITRATE (PF) 100 MCG/2ML IJ SOLN
INTRAMUSCULAR | Status: DC | PRN
  Administered 2016-01-25 (×2): 50 ug via INTRAVENOUS

## 2016-01-25 MED ORDER — 0.9 % SODIUM CHLORIDE (POUR BTL) OPTIME
TOPICAL | Status: DC | PRN
  Administered 2016-01-25: 1000 mL

## 2016-01-25 MED ORDER — CHLORHEXIDINE GLUCONATE CLOTH 2 % EX PADS: 6.0000 | MEDICATED_PAD | Freq: Once | CUTANEOUS | Status: DC

## 2016-01-25 MED ORDER — GABAPENTIN 300 MG PO CAPS
ORAL_CAPSULE | ORAL | Status: AC
Start: 2016-01-25 — End: 2016-01-25
  Administered 2016-01-25: 300 mg via ORAL
  Filled 2016-01-25: qty 1

## 2016-01-25 MED ORDER — GABAPENTIN 300 MG PO CAPS
300.0000 mg | ORAL_CAPSULE | ORAL | Status: AC
  Administered 2016-01-25: 300 mg via ORAL

## 2016-01-25 MED ORDER — GLYCOPYRROLATE 0.2 MG/ML IV SOSY
PREFILLED_SYRINGE | INTRAVENOUS | Status: AC
  Filled 2016-01-25: qty 3

## 2016-01-25 SURGICAL SUPPLY — 29 items
CHLORAPREP W/TINT 26ML (MISCELLANEOUS) IMPLANT
CONT SPEC 4OZ CLIKSEAL STRL BL (MISCELLANEOUS) ×3 IMPLANT
COVER SURGICAL LIGHT HANDLE (MISCELLANEOUS) ×3 IMPLANT
DECANTER SPIKE VIAL GLASS SM (MISCELLANEOUS) ×6 IMPLANT
DERMABOND ADVANCED (GAUZE/BANDAGES/DRESSINGS) ×2
DERMABOND ADVANCED .7 DNX12 (GAUZE/BANDAGES/DRESSINGS) ×1 IMPLANT
DRAPE LAPAROTOMY 100X72 PEDS (DRAPES) ×3 IMPLANT
ELECT REM PT RETURN 9FT ADLT (ELECTROSURGICAL) ×3
ELECTRODE REM PT RTRN 9FT ADLT (ELECTROSURGICAL) ×1 IMPLANT
GLOVE EUDERMIC 7 POWDERFREE (GLOVE) ×3 IMPLANT
GOWN STRL REUS W/ TWL LRG LVL3 (GOWN DISPOSABLE) ×2 IMPLANT
GOWN STRL REUS W/TWL LRG LVL3 (GOWN DISPOSABLE) ×4
KIT BASIN OR (CUSTOM PROCEDURE TRAY) ×3 IMPLANT
KIT ROOM TURNOVER OR (KITS) ×3 IMPLANT
NEEDLE HYPO 25GX1X1/2 BEV (NEEDLE) ×3 IMPLANT
NS IRRIG 1000ML POUR BTL (IV SOLUTION) ×3 IMPLANT
PACK SURGICAL SETUP 50X90 (CUSTOM PROCEDURE TRAY) ×3 IMPLANT
PAD ARMBOARD 7.5X6 YLW CONV (MISCELLANEOUS) ×3 IMPLANT
PENCIL BUTTON HOLSTER BLD 10FT (ELECTRODE) ×3 IMPLANT
SPONGE LAP 4X18 X RAY DECT (DISPOSABLE) ×6 IMPLANT
SUT MON AB 4-0 PC3 18 (SUTURE) ×6 IMPLANT
SUT SILK 3 0 SH 30 (SUTURE) ×3 IMPLANT
SUT VIC AB 3-0 SH 18 (SUTURE) ×3 IMPLANT
SYR CONTROL 10ML LL (SYRINGE) ×3 IMPLANT
TOWEL OR 17X24 6PK STRL BLUE (TOWEL DISPOSABLE) ×3 IMPLANT
TOWEL OR 17X26 10 PK STRL BLUE (TOWEL DISPOSABLE) ×3 IMPLANT
TUBE CONNECTING 12'X1/4 (SUCTIONS) ×1
TUBE CONNECTING 12X1/4 (SUCTIONS) ×2 IMPLANT
YANKAUER SUCT BULB TIP NO VENT (SUCTIONS) ×3 IMPLANT

## 2016-01-25 NOTE — Anesthesia Preprocedure Evaluation (Signed)
Anesthesia Evaluation  Patient identified by MRN, date of birth, ID band Patient awake    Reviewed: Allergy & Precautions, NPO status , Patient's Chart, lab work & pertinent test results  History of Anesthesia Complications Negative for: history of anesthetic complications  Airway Mallampati: II  TM Distance: >3 FB     Dental no notable dental hx. (+) Dental Advisory Given, Edentulous Upper   Pulmonary COPD, Current Smoker,    Pulmonary exam normal        Cardiovascular hypertension, + CAD, + Past MI and + Cardiac Stents  Normal cardiovascular exam     Neuro/Psych negative neurological ROS  negative psych ROS   GI/Hepatic negative GI ROS, Neg liver ROS,   Endo/Other  negative endocrine ROS  Renal/GU negative Renal ROS     Musculoskeletal   Abdominal   Peds  Hematology   Anesthesia Other Findings   Reproductive/Obstetrics                             Anesthesia Physical Anesthesia Plan  ASA: III  Anesthesia Plan: General   Post-op Pain Management:    Induction: Intravenous  Airway Management Planned: LMA  Additional Equipment:   Intra-op Plan:   Post-operative Plan: Extubation in OR  Informed Consent: I have reviewed the patients History and Physical, chart, labs and discussed the procedure including the risks, benefits and alternatives for the proposed anesthesia with the patient or authorized representative who has indicated his/her understanding and acceptance.   Dental advisory given  Plan Discussed with: CRNA and Anesthesiologist  Anesthesia Plan Comments:         Anesthesia Quick Evaluation

## 2016-01-25 NOTE — Interval H&P Note (Signed)
History and Physical Interval Note:  01/25/2016 2:36 PM  Samantha [name]  has presented today for surgery, with the diagnosis of lymphoma  The various methods of treatment have been discussed with the patient and family. After consideration of risks, benefits and other options for treatment, the patient has consented to  Procedure(s): EXCISION DEEP RIGHT INGUINAL LYMPH NODE BIOPSY WITH ULTRA SOUND (Right) as a surgical intervention .  The patient's history has been reviewed, patient examined, no change in status, stable for surgery.  I have reviewed the patient's chart and labs.  Questions were answered to the patient's satisfaction.     Adin Hector

## 2016-01-25 NOTE — Anesthesia Postprocedure Evaluation (Signed)
Anesthesia Post Note  Patient: MALIKAH REGULA  Procedure(s) Performed: Procedure(s) (LRB): EXCISION DEEP RIGHT INGUINAL LYMPH NODE BIOPSY WITH ULTRA SOUND (Right)  Patient location during evaluation: PACU Anesthesia Type: General Level of consciousness: awake and alert Pain management: pain level controlled Vital Signs Assessment: post-procedure vital signs reviewed and stable Respiratory status: spontaneous breathing, nonlabored ventilation and respiratory function stable Cardiovascular status: blood pressure returned to baseline and stable Postop Assessment: no signs of nausea or vomiting Anesthetic complications: no    Last Vitals:  Vitals:   01/25/16 1645 01/25/16 1707  BP: (!) 133/56   Pulse: 68 62  Resp:  15  Temp:  36.1 C    Last Pain:  Vitals:   01/25/16 1307  TempSrc: Oral                 Darelle Kings,W. EDMOND

## 2016-01-25 NOTE — Transfer of Care (Signed)
Immediate Anesthesia Transfer of Care Note  Patient: Samantha [name]  Procedure(s) Performed: Procedure(s): EXCISION DEEP RIGHT INGUINAL LYMPH NODE BIOPSY WITH ULTRA SOUND (Right)  Patient Location: PACU  Anesthesia Type:General  Level of Consciousness: awake and alert   Airway & Oxygen Therapy: Patient Spontanous Breathing and Patient connected to nasal cannula oxygen  Post-op Assessment: Report given to RN, Post -op Vital signs reviewed and stable and Patient moving all extremities X 4  Post vital signs: Reviewed and stable  Last Vitals:  Vitals:   01/25/16 1307 01/25/16 1643  BP: (!) 152/49   Pulse: (!) 55   Resp: 20   Temp: 36.7 C (P) 36.3 C    Last Pain:  Vitals:   01/25/16 1307  TempSrc: Oral      Patients Stated Pain Goal: 3 ([id number])  Complications: No apparent anesthesia complications

## 2016-01-25 NOTE — Anesthesia Procedure Notes (Signed)
Procedure Name: LMA Insertion Date/Time: 01/25/2016 4:06 PM Performed by: Rejeana Brock L Pre-anesthesia Checklist: Patient identified, Emergency Drugs available, Suction available and Patient being monitored Patient Re-evaluated:Patient Re-evaluated prior to inductionOxygen Delivery Method: Circle System Utilized Preoxygenation: Pre-oxygenation with 100% oxygen Intubation Type: IV induction Ventilation: Mask ventilation without difficulty LMA: LMA inserted LMA Size: 4.0 Number of attempts: 1 Placement Confirmation: positive ETCO2 and breath sounds checked- equal and bilateral Tube secured with: Tape Dental Injury: Teeth and Oropharynx as per pre-operative assessment

## 2016-01-25 NOTE — Op Note (Signed)
Patient Name:           Samantha [name]   Date of Surgery:        01/25/2016  Pre op Diagnosis:      Lymphadenopathy  Post op Diagnosis:    Same  Procedure:                 Excision right inguinal lymph nodes, deep  Surgeon:                     Edsel Petrin. Dalbert Batman, M.D., FACS  Assistant:                      OR staff   Indication for Assistant: N/A  Operative Indications:  . This is a pleasant 75 year old Caucasian female from [place]. Here with her son to discuss right inguinal lymph node biopsy. She is referred by Dr. Irene Limbo at the cancer center and he is evaluating her for possible lymphoma. Dr. Terrence Dupont is her cardiologist.     She had a 20 pound weight loss over the last 6 months. She says her appetite was decreased with this has stopped and she is not losing any more weight. She's had night sweats for 20 years. She has significant degenerative disc disease her back with some radiculopathy. Recent lumbar MRI showed retroperitoneal adenopathy. She had a CT scan of the abdomen and pelvis which showed retroperitoneal lymphadenopathy with multiple nodes adjacent to the aorta and inferior vena cava extending down the common iliacs, right common iliac chain. Also noted was a right inguinal lymph node 12.5 cm in short axis. CT scan of her chest in July showed no adenopathy. PET scan shows hypermetabolic lower retroperitoneal, bilateral common iliac, right external iliac, and right inguinal adenopathy. Solitary focus of osseous hypermetabolism left upper sacrum consistent with lymphoma. No activity in neck or chest. Spleen normal. Ultrasound-guided biopsy by radiology is suspicious but not diagnostic for lymphoma. Non-Hodgkin's lymphoma is suspected. She is referred for excision of the right inguinal lymph node. I have reviewed all of her imaging studies and she seems to have a solitary node in the right inguinal area that we should be able to localize with ultrasound in the operating  room. I checked with the breast center/radiology and they do not do radioactive seed localization in the inguinal location.     Comorbidities include myocardial infarction, coronary stent placement, paroxysmal atrial fibrillation. He takes eliquis. Hyperlipidemia. Hypertension. COPD. Chronic low back pain from DDD. Next signs social history reveals she lost her partner 1 year ago. .      She is willing to have the biopsy. She will be scheduled for excision deep right inguinal lymph node with ultrasound guidance. She wants me to do this at [facility] because for heart disease. We will try to expedite this. I discussed the indications, details, techniques, and numerous risk of the surgery. I discussed the risk of bleeding, infection, nerve damage with chronic pain or numbness, failure to make a diagnosis, injury to adjacent vascular structures, seroma or hematoma formation. She understands these issues well. This time all of her questions were answered. She agrees with this plan.  Operative Findings:       She did not have a single, discrete, pathologic, discolored lymph node which is typical for lymphoma.  Instead she had firm rubbery fatty tissue throughout the infra and inguinal area.  I basically took out all of the lymph node bearing area both medial and  lateral to the greater saphenous vein, all the way down to the deep fascia investing the femoral triangle.  I do not palpate any other abnormality nor did I see anything abnormal on ultrasound.  Procedure in Detail:          Following induction of general LMA anesthesia the lower abdomen and genitalia right groin and upper thigh were prepped and draped in a sterile fashion.  Intravenous antibiotics were given.  Surgical timeout was performed.  0.5% Marcaine was used as local infiltration anesthetic.     Using the ultrasound probe, I scanned the inguinal area both above and below the inguinal ligament and did not find an obvious target.  I made a  transverse incision just below the inguinal ligament extending it both medial and lateral to the palpable femoral artery pulse.  Dissection was carried down through subcutaneous tissue.  I explored thoroughly in this area and then dissected out the lymph node tissue and sent all of it together as a single specimen.  I can find no single discrete pathologic lymph node.  Hemostasis was excellent and achieved electrocautery.  The wound was irrigated with saline.  The deeper tissues were closed with interrupted 2-0 Vicryl sutures and the skin closed with a running subcuticular 4-0 Monocryl and Dermabond.  Patient tolerated the procedure well was taken to PACU in stable condition.  EBL 10-15 mL.  Counts correct.  Complications none.     Edsel Petrin. Dalbert Batman, M.D., FACS General and Minimally Invasive Surgery Breast and Colorectal Surgery  01/25/2016 4:41 PM

## 2016-01-25 NOTE — Discharge Instructions (Signed)
Ice pack on wound for 10 minutes at a time Do this 2 or 3 times an hour.  The clear plastic superglue should wear off in about 3 weeks  Call Dr. Dalbert Batman if there is severe pain or bleeding or swelling You may shower starting tomorrow No tub baths  Do not leave the house for 24 hours After that, take a walk around the block every day  We should be able to call the report to you Monday. Give [place] a call if there are any problems

## 2016-01-26 ENCOUNTER — Encounter (HOSPITAL_COMMUNITY): Payer: Self-pay | Admitting: General Surgery

## 2016-01-30 ENCOUNTER — Telehealth: Payer: Self-pay | Admitting: Hematology

## 2016-01-30 ENCOUNTER — Other Ambulatory Visit (HOSPITAL_BASED_OUTPATIENT_CLINIC_OR_DEPARTMENT_OTHER): Payer: Medicare Other

## 2016-01-30 ENCOUNTER — Telehealth: Payer: Self-pay | Admitting: *Deleted

## 2016-01-30 ENCOUNTER — Encounter: Payer: Self-pay | Admitting: Hematology

## 2016-01-30 ENCOUNTER — Ambulatory Visit (HOSPITAL_BASED_OUTPATIENT_CLINIC_OR_DEPARTMENT_OTHER): Payer: Medicare Other | Admitting: Hematology

## 2016-01-30 VITALS — BP 145/60 | HR 58 | Temp 98.3°F | Resp 18 | Ht 70.0 in | Wt 210.9 lb

## 2016-01-30 DIAGNOSIS — I2510 Atherosclerotic heart disease of native coronary artery without angina pectoris: Secondary | ICD-10-CM

## 2016-01-30 DIAGNOSIS — E440 Moderate protein-calorie malnutrition: Secondary | ICD-10-CM

## 2016-01-30 DIAGNOSIS — E119 Type 2 diabetes mellitus without complications: Secondary | ICD-10-CM

## 2016-01-30 DIAGNOSIS — F17200 Nicotine dependence, unspecified, uncomplicated: Secondary | ICD-10-CM

## 2016-01-30 DIAGNOSIS — C8580 Other specified types of non-Hodgkin lymphoma, unspecified site: Secondary | ICD-10-CM | POA: Diagnosis not present

## 2016-01-30 DIAGNOSIS — J449 Chronic obstructive pulmonary disease, unspecified: Secondary | ICD-10-CM

## 2016-01-30 DIAGNOSIS — C8298 Follicular lymphoma, unspecified, lymph nodes of multiple sites: Secondary | ICD-10-CM

## 2016-01-30 DIAGNOSIS — Z23 Encounter for immunization: Secondary | ICD-10-CM | POA: Diagnosis not present

## 2016-01-30 DIAGNOSIS — E669 Obesity, unspecified: Secondary | ICD-10-CM

## 2016-01-30 DIAGNOSIS — I252 Old myocardial infarction: Secondary | ICD-10-CM

## 2016-01-30 DIAGNOSIS — C8598 Non-Hodgkin lymphoma, unspecified, lymph nodes of multiple sites: Secondary | ICD-10-CM

## 2016-01-30 DIAGNOSIS — I10 Essential (primary) hypertension: Secondary | ICD-10-CM

## 2016-01-30 LAB — COMPREHENSIVE METABOLIC PANEL
ALBUMIN: 3.4 g/dL — AB (ref 3.5–5.0)
ALK PHOS: 67 U/L (ref 40–150)
ALT: 14 U/L (ref 0–55)
ANION GAP: 9 meq/L (ref 3–11)
AST: 19 U/L (ref 5–34)
BUN: 10.6 mg/dL (ref 7.0–26.0)
CALCIUM: 9.4 mg/dL (ref 8.4–10.4)
CO2: 27 mEq/L (ref 22–29)
Chloride: 107 mEq/L (ref 98–109)
Creatinine: 0.8 mg/dL (ref 0.6–1.1)
EGFR: 74 mL/min/{1.73_m2} — ABNORMAL LOW (ref >90)
Glucose: 118 mg/dl (ref 70–140)
POTASSIUM: 4.5 meq/L (ref 3.5–5.1)
Sodium: 143 mEq/L (ref 136–145)
Total Bilirubin: 0.42 mg/dL (ref 0.20–1.20)
Total Protein: 7 g/dL (ref 6.4–8.3)

## 2016-01-30 LAB — CBC & DIFF AND RETIC
BASO%: 0.4 % (ref 0.0–2.0)
BASOS ABS: 0 10*3/uL (ref 0.0–0.1)
EOS ABS: 0.2 10*3/uL (ref 0.0–0.5)
EOS%: 2.4 % (ref 0.0–7.0)
HEMATOCRIT: 41.6 % (ref 34.8–46.6)
HEMOGLOBIN: 13.3 g/dL (ref 11.6–15.9)
Immature Retic Fract: 3.1 % (ref 1.60–10.00)
LYMPH%: 27 % (ref 14.0–49.7)
MCH: 31.3 pg (ref 25.1–34.0)
MCHC: 32 g/dL (ref 31.5–36.0)
MCV: 97.9 fL (ref 79.5–101.0)
MONO#: 0.8 10*3/uL (ref 0.1–0.9)
MONO%: 7.4 % (ref 0.0–14.0)
NEUT#: 6.4 10*3/uL (ref 1.5–6.5)
NEUT%: 62.8 % (ref 38.4–76.8)
PLATELETS: 182 10*3/uL (ref 145–400)
RBC: 4.25 10*6/uL (ref 3.70–5.45)
RDW: 13.4 % (ref 11.2–14.5)
Retic %: 1.18 % (ref 0.70–2.10)
Retic Ct Abs: 50.15 10*3/uL (ref 33.70–90.70)
WBC: 10.2 10*3/uL (ref 3.9–10.3)
lymph#: 2.8 10*3/uL (ref 0.9–3.3)

## 2016-01-30 LAB — LACTATE DEHYDROGENASE: LDH: 200 U/L (ref 125–245)

## 2016-01-30 MED ORDER — DEXAMETHASONE 4 MG PO TABS: 8.0000 mg | ORAL_TABLET | Freq: Every day | ORAL | 1 refills | Status: DC

## 2016-01-30 MED ORDER — PNEUMOCOCCAL 13-VAL CONJ VACC IM SUSP
0.5000 mL | Freq: Once | INTRAMUSCULAR | Status: AC
  Administered 2016-01-30: 0.5 mL via INTRAMUSCULAR
  Filled 2016-01-30: qty 0.5

## 2016-01-30 MED ORDER — ACYCLOVIR 400 MG PO TABS: 400.0000 mg | ORAL_TABLET | Freq: Every day | ORAL | 3 refills | Status: DC

## 2016-01-30 MED ORDER — ONDANSETRON HCL 8 MG PO TABS: 8.0000 mg | ORAL_TABLET | Freq: Two times a day (BID) | ORAL | 1 refills | Status: DC | PRN

## 2016-01-30 NOTE — Progress Notes (Signed)
Inform patient of Pathology report,.  Tell her that the lymph node biopsy was successful.  As expected, it confirms a low-grade lymphoma.  She needs to make an appointment with Dr. Irene Limbo, her oncologist as soon as possible.  He has also been sent a copy of the pathology report.  I need to see her back for a wound check.   hmi

## 2016-01-30 NOTE — Telephone Encounter (Signed)
Per LOS I have scheduled appts and notified the [employer]herfuelr

## 2016-01-30 NOTE — Telephone Encounter (Signed)
Message sent to chemo scheduler to be added per 01/30/16 los. Chemo ed and Nutrition Counseling appointments, also scheduled. Appointments scheduled per 01/30/16 los. AVS report and appointment schedule given to patient per 01/30/16 los.

## 2016-02-01 ENCOUNTER — Other Ambulatory Visit: Payer: Medicare Other

## 2016-02-01 ENCOUNTER — Encounter: Payer: Self-pay | Admitting: *Deleted

## 2016-02-06 ENCOUNTER — Ambulatory Visit: Payer: Medicare Other | Admitting: Nutrition

## 2016-02-06 NOTE — Progress Notes (Signed)
75 year old female diagnosed with non-Hodgkin's lymphoma.  She is a patient of Dr. Irene Limbo.  Past medical history includes hypertension, diabetes, dyslipidemia, CAD, DDD, tobacco, and COPD  Medications include Xanax, Lipitor.  Labs include glucose 118 and albumin 3.4 on November 13.  Height: 5 feet 10 inches. Weight: 210.9 pounds November 13. Usual body weight: 234 pounds one year ago.  Patient weight 212 pounds in July 2017. BMI: 30.26.  Patient has lost approximately 10% of her usual body weight over the past 12 months. She reports her appetite comes and goes but lately it has been pretty good. She typically does not eat breakfast, eats peanut butter crackers at lunch and eats a sandwich for dinner. She has purchased and started drinking ensure.  Nutrition diagnosis:  Food and nutrition related knowledge deficit related to new diagnosis of non-Hodgkin's lymphoma as evidenced by no prior need for nutrition related information.  Intervention: Educated patient to increase meals and snacks.  5-6 times daily. Encouraged healthy diet with adequate protein. Recommended patient try ensure high-protein at breakfast and as a snack if she is unable to eat regular foods. Recommended patient strive for weight maintenance. Answered questions.  Provided fact sheets. Gave patient my contact information and used teach back method.  Monitoring, evaluation, goals: Patient will tolerate healthy plant-based diet with adequate protein for weight maintenance.  Next visit: Monday, December 4, during infusion.  **Disclaimer: This note was dictated with voice recognition software. Similar sounding words can inadvertently be transcribed and this note may contain transcription errors which may not have been corrected upon publication of note.**

## 2016-02-08 ENCOUNTER — Telehealth: Payer: Self-pay

## 2016-02-08 NOTE — Telephone Encounter (Signed)
Pt called asking why Dr Irene Limbo Rx acyclovir. I explained prophylactic antiviral reason.

## 2016-02-14 ENCOUNTER — Other Ambulatory Visit: Payer: Self-pay | Admitting: *Deleted

## 2016-02-17 ENCOUNTER — Other Ambulatory Visit: Payer: Self-pay | Admitting: *Deleted

## 2016-02-17 ENCOUNTER — Telehealth: Payer: Self-pay | Admitting: *Deleted

## 2016-02-17 ENCOUNTER — Telehealth: Payer: Self-pay | Admitting: Hematology

## 2016-02-17 DIAGNOSIS — C8290 Follicular lymphoma, unspecified, unspecified site: Secondary | ICD-10-CM

## 2016-02-17 NOTE — Telephone Encounter (Signed)
Per staff message the patient called and canceled their appt for Monday

## 2016-02-17 NOTE — Telephone Encounter (Signed)
Patient called to have all December appointments canceled. Per the patient she spoke with MD concerning appointments.

## 2016-02-20 ENCOUNTER — Ambulatory Visit: Payer: Medicare Other

## 2016-02-20 ENCOUNTER — Encounter: Payer: Medicare Other | Admitting: Nutrition

## 2016-02-21 ENCOUNTER — Ambulatory Visit: Payer: Medicare Other

## 2016-03-06 ENCOUNTER — Ambulatory Visit: Payer: Medicare Other | Admitting: Hematology

## 2016-03-06 ENCOUNTER — Other Ambulatory Visit: Payer: Medicare Other

## 2016-03-08 ENCOUNTER — Telehealth: Payer: Self-pay | Admitting: *Deleted

## 2016-03-08 NOTE — Telephone Encounter (Signed)
[place] called to ask about Rx for Acyclovir pt got from pharmacy last month.   Pt says she was not instructed on how or when to take it?  Pt has not started on treatment yet due to wound healing.  Pt scheduled to see Dr. Irene Limbo on 1/2 and start treatment on 1/4.   Informed Vaughan Basta ok for pt to wait to start Acyclovir until she sees Dr. Irene Limbo on 1/2.  Have pt bring bottle or write down question to remember to ask on visit.  Typically Acyclovir is given prophylactic while pt on treatment.  Vaughan Basta will instruct pt.Marland Kitchen

## 2016-03-20 ENCOUNTER — Ambulatory Visit (HOSPITAL_BASED_OUTPATIENT_CLINIC_OR_DEPARTMENT_OTHER): Payer: Medicare Other | Admitting: Hematology

## 2016-03-20 ENCOUNTER — Telehealth: Payer: Self-pay | Admitting: Hematology

## 2016-03-20 ENCOUNTER — Other Ambulatory Visit (HOSPITAL_BASED_OUTPATIENT_CLINIC_OR_DEPARTMENT_OTHER): Payer: Medicare Other

## 2016-03-20 ENCOUNTER — Encounter: Payer: Self-pay | Admitting: Hematology

## 2016-03-20 VITALS — BP 198/54 | HR 63 | Temp 98.5°F | Resp 18 | Wt 212.9 lb

## 2016-03-20 DIAGNOSIS — C8298 Follicular lymphoma, unspecified, lymph nodes of multiple sites: Secondary | ICD-10-CM

## 2016-03-20 DIAGNOSIS — C8290 Follicular lymphoma, unspecified, unspecified site: Secondary | ICD-10-CM

## 2016-03-20 LAB — COMPREHENSIVE METABOLIC PANEL
ALT: 18 U/L (ref 0–55)
ANION GAP: 8 meq/L (ref 3–11)
AST: 21 U/L (ref 5–34)
Albumin: 3.6 g/dL (ref 3.5–5.0)
Alkaline Phosphatase: 79 U/L (ref 40–150)
BILIRUBIN TOTAL: 0.27 mg/dL (ref 0.20–1.20)
BUN: 15.9 mg/dL (ref 7.0–26.0)
CHLORIDE: 105 meq/L (ref 98–109)
CO2: 25 meq/L (ref 22–29)
CREATININE: 1.1 mg/dL (ref 0.6–1.1)
Calcium: 9.1 mg/dL (ref 8.4–10.4)
EGFR: 51 mL/min/{1.73_m2} — ABNORMAL LOW (ref >90)
GLUCOSE: 89 mg/dL (ref 70–140)
Potassium: 5.1 mEq/L (ref 3.5–5.1)
Sodium: 138 mEq/L (ref 136–145)
TOTAL PROTEIN: 7.3 g/dL (ref 6.4–8.3)

## 2016-03-20 LAB — CBC & DIFF AND RETIC
BASO%: 0.6 % (ref 0.0–2.0)
BASOS ABS: 0.1 10*3/uL (ref 0.0–0.1)
EOS%: 6.4 % (ref 0.0–7.0)
Eosinophils Absolute: 0.7 10*3/uL — ABNORMAL HIGH (ref 0.0–0.5)
HCT: 39.6 % (ref 34.8–46.6)
HGB: 12.8 g/dL (ref 11.6–15.9)
IMMATURE RETIC FRACT: 5.9 % (ref 1.60–10.00)
LYMPH#: 3.7 10*3/uL — AB (ref 0.9–3.3)
LYMPH%: 32.5 % (ref 14.0–49.7)
MCH: 31.3 pg (ref 25.1–34.0)
MCHC: 32.3 g/dL (ref 31.5–36.0)
MCV: 96.8 fL (ref 79.5–101.0)
MONO#: 0.7 10*3/uL (ref 0.1–0.9)
MONO%: 5.9 % (ref 0.0–14.0)
NEUT#: 6.2 10*3/uL (ref 1.5–6.5)
NEUT%: 54.6 % (ref 38.4–76.8)
PLATELETS: 178 10*3/uL (ref 145–400)
RBC: 4.09 10*6/uL (ref 3.70–5.45)
RDW: 13.6 % (ref 11.2–14.5)
RETIC %: 1.47 % (ref 0.70–2.10)
RETIC CT ABS: 60.12 10*3/uL (ref 33.70–90.70)
WBC: 11.3 10*3/uL — ABNORMAL HIGH (ref 3.9–10.3)

## 2016-03-20 NOTE — Telephone Encounter (Signed)
Gave patient avs report and appointments for January. Per [facility] no infusion scheduled at this time.

## 2016-03-21 LAB — HEPATITIS B SURFACE ANTIGEN: HBsAg Screen: NEGATIVE

## 2016-03-21 NOTE — Progress Notes (Signed)
HEMATOLOGY/ONCOLOGY CLINIC NOTE  Date of Service: .03/20/2016  Patient Care Team: Charolette Forward, MD as PCP - General (Cardiology) Physical medicine and rehabilitation: Sharlet Salina DO  CHIEF COMPLAINTS/PURPOSE OF CONSULTATION:  Newly diagnosed lymphoma  HISTORY OF PRESENTING ILLNESS:   Samantha [name] is a wonderful 76 y.o. female who has been referred to [place] by Dr .Charolette Forward, MD for evaluation and management of newly diagnosed lymphoma.   Patient has multiple medical comorbidities including history of hypertension, diabetes, dyslipidemia, coronary disease status post PCI in 2001, paroxysmal atrial fibrillation on anticoagulation and chronic low back pain due to degenerative degenerative disc disease with lumbar radiculopathy.  Patient notes increasing lower back pain for which she had an MRI of the lumbar spine without contrast on 10/31/2015 which showed retroperitoneal lymphadenopathy in addition to her degenerative disease. A CT of the abdomen and pelvis was done with contrast on 11/17/2015 and showed retroperitoneal lymphadenopathy with multiple nodes directly adjacent to the aorta and inferior vena cava. The adenopathy extends along the right common iliac chain as well as noted a right inguinal lymph node 12.5 mm in short axis.  Patient previously had a CT of the chest without contrast on 09/19/2015 to evaluate left lower chest wall pain which showed no evidence of lymphadenopathy or other acute cardiopulmonary disease.  Patient notes some chills at night and night sweats. Hasn't lost about 20 pounds of weight in the last 6 months down from 234 pounds to about 215 pounds. No overt fevers. Some increase in fatigue. Has been drinking ensure.  Patient subsequently had an ultrasound-guided biopsy of a right inguinal lymph node that shows atypical lymphoid proliferation and clonal B-cell population with kappa light chain excess. Overall picture is suggestive of follicular lymphoma  but was not definitive. The pathologist recommended excisional lymph node biopsy for definitive diagnosis.  Is accompanied by her friend and another family member for this clinic visit.  He had several questions which were answered in details.  INTERVAL HISTORY  Patient is here with her friend for followup of her follicular lymphoma. Her chemotherapy which was scheduled for 02/20/2016 was held due to rt inguinal wound infection. She notes that wound infection is still clearing and she was switched to Bactrim and the wound need drainage. She continues to follow with Dr Dalbert Batman for this. No fevers , chills and the induration appears to be improving with minimal drainage at this time.  MEDICAL HISTORY:  Past Medical History:  Diagnosis Date  . A-fib ([place])   . Arthritis   . Chronic back pain   . COPD (chronic obstructive pulmonary disease) ([place])   . Coronary artery disease   . Glaucoma   . Hearing aid worn   . High cholesterol   . Hypertension   . Lymphadenopathy 01/25/2016  . Myocardial infarction    2000  . Non Hodgkin's lymphoma ([place])   . Wears dentures    " Top plate"  . Wears glasses   #1 bilateral cataracts #2 COPD  #3 active smoker half a pack per day for about 60 years #4 degenerative arthritis - has had chronic back pain issues with multiple epidural steroid injections from 2014 [5 times] #5 glaucoma #6 degenerative arthritis #7 history of TIA #8 dyslipidemia #9 hypertension #10 coronary artery disease with inferior wall MI in the past, status post PCI to RCA in 2001 #11 hypothyroidism #12 lumbar radiculopathy/neuropathy. #13 paroxysmal atrial fibrillation on anticoagulation with Eliquis. #14 type 2 diabetes #16 obesity  SURGICAL  HISTORY:  #1 cardiac catheterization #2 cholecystectomy #3 colonoscopy #4 wisdom teeth extraction.  SOCIAL HISTORY: Currently single. Notes that she lost her female partner 9 months ago. Active smoker half pack per day since her  teens  FAMILY HISTORY: Patient reports no history of cancers or blood disorders in the family that she is aware of.  ALLERGIES:  is allergic to hydrocodone and oxycodone.  MEDICATIONS:  Current Outpatient Prescriptions  Medication Sig Dispense Refill  . acyclovir (ZOVIRAX) 400 MG tablet Take 1 tablet (400 mg total) by mouth daily. 30 tablet 3  . ALPRAZolam (XANAX) 0.25 MG tablet Take 0.25 mg by mouth daily as needed for anxiety.     Marland Kitchen amiodarone (PACERONE) 200 MG tablet Take 100 mg by mouth every morning.    Marland Kitchen atorvastatin (LIPITOR) 40 MG tablet Take 40 mg by mouth every morning.     . Bimatoprost (LUMIGAN OP) Place 1 drop into both eyes at bedtime.    Marland Kitchen dexamethasone (DECADRON) 4 MG tablet Take 2 tablets (8 mg total) by mouth daily. Start the day after bendamustine chemotherapy for 2 days. Take with food. 30 tablet 1  . ELIQUIS 5 MG TABS tablet Take 5 mg by mouth 2 (two) times daily.    Marland Kitchen gabapentin (NEURONTIN) 300 MG capsule Take 300 mg by mouth 2 (two) times daily.    . Hypromellose (ARTIFICIAL TEARS OP) Place 1 drop into both eyes daily as needed. Dry eyes    . losartan (COZAAR) 50 MG tablet Take 50 mg by mouth every morning.    . Melatonin 5 MG TABS Take 5 mg by mouth at bedtime.    . meloxicam (MOBIC) 7.5 MG tablet Take 7.5 mg by mouth at bedtime.    . metoprolol succinate (TOPROL-XL) 50 MG 24 hr tablet Take 50 mg by mouth daily.    . nitroGLYCERIN (NITROSTAT) 0.4 MG SL tablet Place 0.4 mg under the tongue every 5 (five) minutes x 3 doses as needed for chest pain.     Marland Kitchen ondansetron (ZOFRAN) 8 MG tablet Take 1 tablet (8 mg total) by mouth 2 (two) times daily as needed for refractory nausea / vomiting. Start on day 2 after bendamustine chemo 30 tablet 1  . sulfamethoxazole-trimethoprim (BACTRIM DS,SEPTRA DS) 800-160 MG tablet     . traMADol (ULTRAM) 50 MG tablet Take 1-2 tablets (50-100 mg total) by mouth every 6 (six) hours as needed. 30 tablet 1   No current facility-administered  medications for this visit.     REVIEW OF SYSTEMS:    10 Point review of Systems was done is negative except as noted above.  PHYSICAL EXAMINATION: ECOG PERFORMANCE STATUS: 1 - Symptomatic but completely ambulatory  . Vitals:   03/20/16 1333  BP: (!) 198/54  Pulse: 63  Resp: 18  Temp: 98.5 F (36.9 C)   Filed Weights   03/20/16 1333  Weight: 212 lb 14.4 oz (96.6 kg)   .Body mass index is 30.55 kg/m.  GENERAL:alert, NAD but anxious due to her new diagnosis. SKIN: some indurated at the site of the rt inguinal LN biopsy with some serous drainage. EYES: normal, conjunctiva are pink and non-injected, sclera clear OROPHARYNX:no exudate, no erythema and lips, buccal mucosa, and tongue normal  NECK: supple, no JVD, thyroid normal size, non-tender, without nodularity LYMPH:  no palpable lymphadenopathy in the cervical, axillary. Noted to have bilateral palpable inguinal lymph nodes R>L LUNGS: clear to auscultation with normal respiratory effort HEART: regular rate & rhythm,  no murmurs  and trace lower extremity edema ABDOMEN: abdomen soft, non-tender, normoactive bowel sounds, no palpable hepatosplenomegaly  Musculoskeletal: no cyanosis of digits and no clubbing  PSYCH: alert & oriented x 3 with fluent speech NEURO: no focal motor/sensory deficits  LABORATORY DATA:  I have reviewed the data as listed  . CBC Latest Ref Rng & Units 03/20/2016 01/30/2016 01/23/2016  WBC 3.9 - 10.3 10e3/uL 11.3(H) 10.2 9.8  Hemoglobin 11.6 - 15.9 g/dL 12.8 13.3 13.2  Hematocrit 34.8 - 46.6 % 39.6 41.6 41.6  Platelets 145 - 400 10e3/uL 178 182 175    . CMP Latest Ref Rng & Units 03/20/2016 01/30/2016 01/23/2016  Glucose 70 - 140 mg/dl 89 118 89  BUN 7.0 - 26.0 mg/dL 15.9 10.6 11  Creatinine 0.6 - 1.1 mg/dL 1.1 0.8 0.90  Sodium 136 - 145 mEq/L 138 143 143  Potassium 3.5 - 5.1 mEq/L 5.1 4.5 4.2  Chloride 101 - 111 mmol/L - - 107  CO2 22 - 29 mEq/L 25 27 28   Calcium 8.4 - 10.4 mg/dL 9.1 9.4 9.0    Total Protein 6.4 - 8.3 g/dL 7.3 7.0 6.2(L)  Total Bilirubin 0.20 - 1.20 mg/dL 0.27 0.42 0.5  Alkaline Phos 40 - 150 U/L 79 67 51  AST 5 - 34 U/L 21 19 22   ALT 0 - 55 U/L 18 14 17    . Lab Results  Component Value Date   LDH 200 01/30/2016            RADIOGRAPHIC STUDIES: I have personally reviewed the radiological images as listed and agreed with the findings in the report. No results found. MRI Lspine  10/31/2015 :   IMPRESSION: 1. At L4-5 there is a moderate broad-based disc bulge. Mild bilateral facet arthropathy with ligamentum flavum infolding. Severe spinal stenosis. Severe bilateral foraminal stenosis. 2. At L3-4 there is a broad-based disc bulge eccentric towards the right. Mild bilateral facet arthropathy. Bilateral lateral recess stenosis and mild spinal stenosis. Mild right foraminal stenosis. 3. At L2-3 there is a mild broad-based disc bulge flattening the ventral thecal sac. Bilateral lateral recess stenosis. Bilateral mild facet arthropathy. Mild spinal stenosis. 4. At L5-S1 there is a left paracentral disc protrusion abutting the left intraspinal S1 nerve root. 5. Retroperitoneal lymphadenopathy of uncertain etiology. Differential considerations include an inflammatory or infectious etiology versus neoplastic etiology such as lymphoma. Recommend further evaluation with a CT of the abdomen/pelvis with intravenous contrast.   Electronically Signed   By: Kathreen Devoid   On: 10/31/2015 14:54  PET/CT 12/22/2015  IMPRESSION: 1. Hypermetabolic confluent lower retroperitoneal / bilateral common iliac, right external iliac and right inguinal lymphadenopathy, consistent with lymphoma. 2. Solitary focus of osseous hypermetabolism in the left upper sacrum consistent with osseous involvement by lymphoma. 3. No metabolically active lymphoma in the neck or chest. Spleen is normal in size and metabolism. 4. Additional findings include aortic  atherosclerosis, three-vessel coronary atherosclerosis and mild sigmoid diverticulosis.   Electronically Signed   By: Ilona Sorrel M.D.   On: 12/22/2015 17:21   ASSESSMENT & PLAN:   76 year old Caucasian female with multiple chronic medical comorbidities as noted above with   #1 Newly noted Stage IVB Low grade Follicular Lymphoma with extensive retroperitoneal lymphadenopathy and right inguinal lymphadenopathy as well as osseous involvement of the sacrum. LDH level within normal limits.  #2 Rt inguinal LN biopsy site infection -- still resolving. Antibiotics were changed to Bactrim a few weeks back. Being managed by Dr Dalbert Batman PLan - she has resolving  wound discharge. Some residual induration. -she will followup with Dr Dalbert Batman to continue mx of wound infection. -we discussed that since this is a low grade FL it is prudent to allow her wound infection time to completely heal before initiating chemotherapy. -if non healing is due to lymphatic obstruction due to RP LN involvement might need to start her on treatment sooner than later under abx coverage if needed. -counseled on smoking cessation since this an additional factor in poor wound healing. -optimize po intake and fluids. -RTC in 2-3 weeks with labs Cancel chemotherapy appointment for 03/22/2016  All of the patients , her friends and family members questions were answered to their apparent satisfaction. The patient knows to call the clinic with any problems, questions or concerns.  I spent 20 minutes counseling the patient face to face. The total time spent in the appointment was 20 minutes and more than 50% was on counseling and direct patient cares.    Sullivan Lone MD [place] AAHIVMS [facility] Hematology/Oncology Physician [facility]  (Office):       [phone] (Work cell):  [phone] (Fax):           [phone]

## 2016-03-21 NOTE — Progress Notes (Signed)
HEMATOLOGY/ONCOLOGY CLINIC NOTE  Date of Service: .01/30/2016  Patient Care Team: Charolette Forward, MD as PCP - General (Cardiology) Physical medicine and rehabilitation: Sharlet Salina DO  CHIEF COMPLAINTS/PURPOSE OF CONSULTATION:  Newly diagnosed lymphoma  HISTORY OF PRESENTING ILLNESS:   Samantha [name] is a wonderful 76 y.o. female who has been referred to [place] by Dr .Charolette Forward, MD for evaluation and management of newly diagnosed lymphoma.   Patient has multiple medical comorbidities including history of hypertension, diabetes, dyslipidemia, coronary disease status post PCI in 2001, paroxysmal atrial fibrillation on anticoagulation and chronic low back pain due to degenerative degenerative disc disease with lumbar radiculopathy.  Patient notes increasing lower back pain for which she had an MRI of the lumbar spine without contrast on 10/31/2015 which showed retroperitoneal lymphadenopathy in addition to her degenerative disease. A CT of the abdomen and pelvis was done with contrast on 11/17/2015 and showed retroperitoneal lymphadenopathy with multiple nodes directly adjacent to the aorta and inferior vena cava. The adenopathy extends along the right common iliac chain as well as noted a right inguinal lymph node 12.5 mm in short axis.  Patient previously had a CT of the chest without contrast on 09/19/2015 to evaluate left lower chest wall pain which showed no evidence of lymphadenopathy or other acute cardiopulmonary disease.  Patient notes some chills at night and night sweats. Hasn't lost about 20 pounds of weight in the last 6 months down from 234 pounds to about 215 pounds. No overt fevers. Some increase in fatigue. Has been drinking ensure.  Patient subsequently had an ultrasound-guided biopsy of a right inguinal lymph node that shows atypical lymphoid proliferation and clonal B-cell population with kappa light chain excess. Overall picture is suggestive of follicular lymphoma  but was not definitive. The pathologist recommended excisional lymph node biopsy for definitive diagnosis.  Is accompanied by her friend and another family member for this clinic visit.  He had several questions which were answered in details.  INTERVAL HISTORY  Patient is here with her friend to discuss the results of her rt inguinal excisional LN biopsy. Notes some discomfort and swelling at the site of the inguinal LN biopsy and was she was recommended to report this immediately to her surgery to r/o surgical site infection/seroma.  We discussed the LN biopsy results and treatment approaches. Some fatigue. No abdominal pain or other acute new focal symptoms.  MEDICAL HISTORY:  Past Medical History:  Diagnosis Date  . A-fib ([place])   . Arthritis   . Chronic back pain   . COPD (chronic obstructive pulmonary disease) ([place])   . Coronary artery disease   . Glaucoma   . Hearing aid worn   . High cholesterol   . Hypertension   . Lymphadenopathy 01/25/2016  . Myocardial infarction    2000  . Non Hodgkin's lymphoma ([place])   . Wears dentures    " Top plate"  . Wears glasses   #1 bilateral cataracts #2 COPD  #3 active smoker half a pack per day for about 60 years #4 degenerative arthritis - has had chronic back pain issues with multiple epidural steroid injections from 2014 [5 times] #5 glaucoma #6 degenerative arthritis #7 history of TIA #8 dyslipidemia #9 hypertension #10 coronary artery disease with inferior wall MI in the past, status post PCI to RCA in 2001 #11 hypothyroidism #12 lumbar radiculopathy/neuropathy. #13 paroxysmal atrial fibrillation on anticoagulation with Eliquis. #14 type 2 diabetes #16 obesity  SURGICAL HISTORY:  #1 cardiac  catheterization #2 cholecystectomy #3 colonoscopy #4 wisdom teeth extraction.  SOCIAL HISTORY: Currently single. Notes that she lost her female partner 9 months ago. Active smoker half pack per day since her teens  FAMILY  HISTORY: Patient reports no history of cancers or blood disorders in the family that she is aware of.  ALLERGIES:  is allergic to hydrocodone and oxycodone.  MEDICATIONS:  Current Outpatient Prescriptions  Medication Sig Dispense Refill  . acyclovir (ZOVIRAX) 400 MG tablet Take 1 tablet (400 mg total) by mouth daily. 30 tablet 3  . ALPRAZolam (XANAX) 0.25 MG tablet Take 0.25 mg by mouth daily as needed for anxiety.     Marland Kitchen amiodarone (PACERONE) 200 MG tablet Take 100 mg by mouth every morning.    Marland Kitchen atorvastatin (LIPITOR) 40 MG tablet Take 40 mg by mouth every morning.     . Bimatoprost (LUMIGAN OP) Place 1 drop into both eyes at bedtime.    Marland Kitchen dexamethasone (DECADRON) 4 MG tablet Take 2 tablets (8 mg total) by mouth daily. Start the day after bendamustine chemotherapy for 2 days. Take with food. 30 tablet 1  . ELIQUIS 5 MG TABS tablet Take 5 mg by mouth 2 (two) times daily.    Marland Kitchen gabapentin (NEURONTIN) 300 MG capsule Take 300 mg by mouth 2 (two) times daily.    . Hypromellose (ARTIFICIAL TEARS OP) Place 1 drop into both eyes daily as needed. Dry eyes    . losartan (COZAAR) 50 MG tablet Take 50 mg by mouth every morning.    . Melatonin 5 MG TABS Take 5 mg by mouth at bedtime.    . meloxicam (MOBIC) 7.5 MG tablet Take 7.5 mg by mouth at bedtime.    . metoprolol succinate (TOPROL-XL) 50 MG 24 hr tablet Take 50 mg by mouth daily.    . nitroGLYCERIN (NITROSTAT) 0.4 MG SL tablet Place 0.4 mg under the tongue every 5 (five) minutes x 3 doses as needed for chest pain.     Marland Kitchen ondansetron (ZOFRAN) 8 MG tablet Take 1 tablet (8 mg total) by mouth 2 (two) times daily as needed for refractory nausea / vomiting. Start on day 2 after bendamustine chemo 30 tablet 1  . sulfamethoxazole-trimethoprim (BACTRIM DS,SEPTRA DS) 800-160 MG tablet     . traMADol (ULTRAM) 50 MG tablet Take 1-2 tablets (50-100 mg total) by mouth every 6 (six) hours as needed. 30 tablet 1   No current facility-administered medications for  this visit.     REVIEW OF SYSTEMS:    10 Point review of Systems was done is negative except as noted above.  PHYSICAL EXAMINATION: ECOG PERFORMANCE STATUS: 1 - Symptomatic but completely ambulatory  . Vitals:   01/30/16 1031  BP: (!) 145/60  Pulse: (!) 58  Resp: 18  Temp: 98.3 F (36.8 C)   Filed Weights   01/30/16 1031  Weight: 210 lb 14.4 oz (95.7 kg)   .Body mass index is 30.26 kg/m.  GENERAL:alert, NAD but anxious due to her new diagnosis. SKIN: some indurated at the site of the rt inguinal LN biopsy with some serous drainage. EYES: normal, conjunctiva are pink and non-injected, sclera clear OROPHARYNX:no exudate, no erythema and lips, buccal mucosa, and tongue normal  NECK: supple, no JVD, thyroid normal size, non-tender, without nodularity LYMPH:  no palpable lymphadenopathy in the cervical, axillary. Noted to have bilateral palpable inguinal lymph nodes R>L LUNGS: clear to auscultation with normal respiratory effort HEART: regular rate & rhythm,  no murmurs and trace lower  extremity edema ABDOMEN: abdomen soft, non-tender, normoactive bowel sounds, no palpable hepatosplenomegaly  Musculoskeletal: no cyanosis of digits and no clubbing  PSYCH: alert & oriented x 3 with fluent speech NEURO: no focal motor/sensory deficits  LABORATORY DATA:  I have reviewed the data as listed  . CBC Latest Ref Rng & Units 03/20/2016 01/30/2016 01/23/2016  WBC 3.9 - 10.3 10e3/uL 11.3(H) 10.2 9.8  Hemoglobin 11.6 - 15.9 g/dL 12.8 13.3 13.2  Hematocrit 34.8 - 46.6 % 39.6 41.6 41.6  Platelets 145 - 400 10e3/uL 178 182 175    . CMP Latest Ref Rng & Units 03/20/2016 01/30/2016 01/23/2016  Glucose 70 - 140 mg/dl 89 118 89  BUN 7.0 - 26.0 mg/dL 15.9 10.6 11  Creatinine 0.6 - 1.1 mg/dL 1.1 0.8 0.90  Sodium 136 - 145 mEq/L 138 143 143  Potassium 3.5 - 5.1 mEq/L 5.1 4.5 4.2  Chloride 101 - 111 mmol/L - - 107  CO2 22 - 29 mEq/L 25 27 28   Calcium 8.4 - 10.4 mg/dL 9.1 9.4 9.0  Total  Protein 6.4 - 8.3 g/dL 7.3 7.0 6.2(L)  Total Bilirubin 0.20 - 1.20 mg/dL 0.27 0.42 0.5  Alkaline Phos 40 - 150 U/L 79 67 51  AST 5 - 34 U/L 21 19 22   ALT 0 - 55 U/L 18 14 17    . Lab Results  Component Value Date   LDH 200 01/30/2016            RADIOGRAPHIC STUDIES: I have personally reviewed the radiological images as listed and agreed with the findings in the report. No results found. MRI Lspine  10/31/2015 :   IMPRESSION: 1. At L4-5 there is a moderate broad-based disc bulge. Mild bilateral facet arthropathy with ligamentum flavum infolding. Severe spinal stenosis. Severe bilateral foraminal stenosis. 2. At L3-4 there is a broad-based disc bulge eccentric towards the right. Mild bilateral facet arthropathy. Bilateral lateral recess stenosis and mild spinal stenosis. Mild right foraminal stenosis. 3. At L2-3 there is a mild broad-based disc bulge flattening the ventral thecal sac. Bilateral lateral recess stenosis. Bilateral mild facet arthropathy. Mild spinal stenosis. 4. At L5-S1 there is a left paracentral disc protrusion abutting the left intraspinal S1 nerve root. 5. Retroperitoneal lymphadenopathy of uncertain etiology. Differential considerations include an inflammatory or infectious etiology versus neoplastic etiology such as lymphoma. Recommend further evaluation with a CT of the abdomen/pelvis with intravenous contrast.   Electronically Signed   By: Kathreen Devoid   On: 10/31/2015 14:54  PET/CT 12/22/2015  IMPRESSION: 1. Hypermetabolic confluent lower retroperitoneal / bilateral common iliac, right external iliac and right inguinal lymphadenopathy, consistent with lymphoma. 2. Solitary focus of osseous hypermetabolism in the left upper sacrum consistent with osseous involvement by lymphoma. 3. No metabolically active lymphoma in the neck or chest. Spleen is normal in size and metabolism. 4. Additional findings include aortic atherosclerosis,  three-vessel coronary atherosclerosis and mild sigmoid diverticulosis.   Electronically Signed   By: Ilona Sorrel M.D.   On: 12/22/2015 17:21   ASSESSMENT & PLAN:   76 year old Caucasian female with multiple chronic medical comorbidities as noted above with   #12 Newly noted Stage IVB Low grade Follicular Lymphoma with extensive retroperitoneal lymphadenopathy and right inguinal lymphadenopathy as well as osseous involvement of the sacrum. LDH level within normal limits. CBC shows normal blood counts. Patient has some mild Type B symptoms with chills but no fevers and about a 20 pound unintentional weight loss in the last 6 months. Has lost about 6  lbs since her last visit Wt Readings from Last 3 Encounters:     01/30/16 210 lb 14.4 oz (95.7 kg)  01/23/16 216 lb 1.6 oz (98 kg)   PLan -pathology results that confirm diagnosis of low grade follicular lymphoma were discussed in details with the patient and her family. -I discussed that the options were to wait and monitor vs treat the disease now. Given her areas of involvement in the retroperitoneum and around blood vessels and sacral involvement I recommended treatment consideration. Patient would like to start treatment  In early december which is quite reasonable. -her rt inguinal LN site is still healing and she was started on Augmentin by [facility] for wound infection. She will continue to f/u with [facility] to ensure clearance of this infection. --Prevnar vaccine today -Pneumococcal conjugate vaccine in 1 month. --Chemotherapy counseling for bendamustine Rituxan for follicular lymphoma -Referral to [facility] nutritional therapy -Chemotherapy (bendamustine/Rituxan) starting from 02/20/2016. -Return to care with Dr. Irene Limbo about 2 weeks after chemotherapy   #2 COPD  #3 active smoker half a pack per day for about 60 years #4 degenerative arthritis - has had chronic back pain issues with multiple epidural steroid injections from 2014 [5  times] #5 glaucoma #6 degenerative arthritis #7 history of TIA #8 dyslipidemia #9 hypertension #10 coronary artery disease with inferior wall MI in the past, status post PCI to RCA in 2001 #11 hypothyroidism #12 lumbar radiculopathy/neuropathy. #13 paroxysmal atrial fibrillation on anticoagulation with Eliquis. #14 type 2 diabetes #16 obesity Plan -counseled on smoking cessation. -Continue follow-up with primary care physician for continued management of all other medical comorbidities.  All of the patients , her friends and family members questions were answered to their apparent satisfaction. The patient knows to call the clinic with any problems, questions or concerns.  I spent 20 minutes counseling the patient face to face. The total time spent in the appointment was 25 minutes and more than 50% was on counseling and direct patient cares.    Sullivan Lone MD [place] AAHIVMS [facility] Hematology/Oncology Physician [facility]  (Office):       [phone] (Work cell):  [phone] (Fax):           [phone]

## 2016-03-21 NOTE — Progress Notes (Signed)
HEMATOLOGY/ONCOLOGY CLINIC NOTE  Date of Service: .01/05/2016  Patient Care Team: Samantha Forward, MD as PCP - General (Cardiology) Physical medicine and rehabilitation: Samantha Salina DO  CHIEF COMPLAINTS/PURPOSE OF CONSULTATION:  Newly diagnosed lymphoma  HISTORY OF PRESENTING ILLNESS:   Samantha [name] is a wonderful 76 y.o. female who has been referred to [place] by Dr .Samantha Forward, MD for evaluation and management of newly diagnosed lymphoma.   Patient has multiple medical comorbidities including history of hypertension, diabetes, dyslipidemia, coronary disease status post PCI in 2001, paroxysmal atrial fibrillation on anticoagulation and chronic low back pain due to degenerative degenerative disc disease with lumbar radiculopathy.  Patient notes increasing lower back pain for which she had an MRI of the lumbar spine without contrast on 10/31/2015 which showed retroperitoneal lymphadenopathy in addition to her degenerative disease. A CT of the abdomen and pelvis was done with contrast on 11/17/2015 and showed retroperitoneal lymphadenopathy with multiple nodes directly adjacent to the aorta and inferior vena cava. The adenopathy extends along the right common iliac chain as well as noted a right inguinal lymph node 12.5 mm in short axis.  Patient previously had a CT of the chest without contrast on 09/19/2015 to evaluate left lower chest wall pain which showed no evidence of lymphadenopathy or other acute cardiopulmonary disease.  Patient notes some chills at night and night sweats. Hasn't lost about 20 pounds of weight in the last 6 months down from 234 pounds to about 215 pounds. No overt fevers. Some increase in fatigue. Has been drinking ensure.  Patient subsequently had an ultrasound-guided biopsy of a right inguinal lymph node that shows atypical lymphoid proliferation and clonal B-cell population with kappa light chain excess. Overall picture is suggestive of follicular lymphoma  but was not definitive. The pathologist recommended excisional lymph node biopsy for definitive diagnosis.  Is accompanied by her friend and another family member for this clinic visit.  He had several questions which were answered in details.  INTERVAL HISTORY  Patient is here with her friend to discuss the results of her PET/CT . We discussed the PET/CT results in details. She notes that the excisional inguinal LN biopsy has been scheduled for 01/25/2016. Some fatigue. No abdominal pain or other acute new focal symptoms.  MEDICAL HISTORY:  Past Medical History:  Diagnosis Date  . A-fib ([place])   . Arthritis   . Chronic back pain   . COPD (chronic obstructive pulmonary disease) ([place])   . Coronary artery disease   . Glaucoma   . Hearing aid worn   . High cholesterol   . Hypertension   . Lymphadenopathy 01/25/2016  . Myocardial infarction    2000  . Non Hodgkin's lymphoma ([place])   . Wears dentures    " Top plate"  . Wears glasses   #1 bilateral cataracts #2 COPD  #3 active smoker half a pack per day for about 60 years #4 degenerative arthritis - has had chronic back pain issues with multiple epidural steroid injections from 2014 [5 times] #5 glaucoma #6 degenerative arthritis #7 history of TIA #8 dyslipidemia #9 hypertension #10 coronary artery disease with inferior wall MI in the past, status post PCI to RCA in 2001 #11 hypothyroidism #12 lumbar radiculopathy/neuropathy. #13 paroxysmal atrial fibrillation on anticoagulation with Eliquis. #14 type 2 diabetes #16 obesity  SURGICAL HISTORY:  #1 cardiac catheterization #2 cholecystectomy #3 colonoscopy #4 wisdom teeth extraction.  SOCIAL HISTORY: Currently single. Notes that she lost her female partner 9 months  ago. Active smoker half pack per day since her teens  FAMILY HISTORY: Patient reports no history of cancers or blood disorders in the family that she is aware of.  ALLERGIES:  is allergic to hydrocodone and  oxycodone.  MEDICATIONS:  Current Outpatient Prescriptions  Medication Sig Dispense Refill  . acyclovir (ZOVIRAX) 400 MG tablet Take 1 tablet (400 mg total) by mouth daily. 30 tablet 3  . ALPRAZolam (XANAX) 0.25 MG tablet Take 0.25 mg by mouth daily as needed for anxiety.     Marland Kitchen amiodarone (PACERONE) 200 MG tablet Take 100 mg by mouth every morning.    Marland Kitchen atorvastatin (LIPITOR) 40 MG tablet Take 40 mg by mouth every morning.     . Bimatoprost (LUMIGAN OP) Place 1 drop into both eyes at bedtime.    Marland Kitchen dexamethasone (DECADRON) 4 MG tablet Take 2 tablets (8 mg total) by mouth daily. Start the day after bendamustine chemotherapy for 2 days. Take with food. 30 tablet 1  . ELIQUIS 5 MG TABS tablet Take 5 mg by mouth 2 (two) times daily.    Marland Kitchen gabapentin (NEURONTIN) 300 MG capsule Take 300 mg by mouth 2 (two) times daily.    . Hypromellose (ARTIFICIAL TEARS OP) Place 1 drop into both eyes daily as needed. Dry eyes    . losartan (COZAAR) 50 MG tablet Take 50 mg by mouth every morning.    . Melatonin 5 MG TABS Take 5 mg by mouth at bedtime.    . meloxicam (MOBIC) 7.5 MG tablet Take 7.5 mg by mouth at bedtime.    . metoprolol succinate (TOPROL-XL) 50 MG 24 hr tablet Take 50 mg by mouth daily.    . nitroGLYCERIN (NITROSTAT) 0.4 MG SL tablet Place 0.4 mg under the tongue every 5 (five) minutes x 3 doses as needed for chest pain.     Marland Kitchen ondansetron (ZOFRAN) 8 MG tablet Take 1 tablet (8 mg total) by mouth 2 (two) times daily as needed for refractory nausea / vomiting. Start on day 2 after bendamustine chemo 30 tablet 1  . sulfamethoxazole-trimethoprim (BACTRIM DS,SEPTRA DS) 800-160 MG tablet     . traMADol (ULTRAM) 50 MG tablet Take 1-2 tablets (50-100 mg total) by mouth every 6 (six) hours as needed. 30 tablet 1   No current facility-administered medications for this visit.     REVIEW OF SYSTEMS:    10 Point review of Systems was done is negative except as noted above.  PHYSICAL EXAMINATION: ECOG  PERFORMANCE STATUS: 1 - Symptomatic but completely ambulatory  . Vitals:   01/05/16 0938  BP: (!) 152/63  Pulse: (!) 59  Resp: 18  Temp: 98.1 F (36.7 C)   Filed Weights   01/05/16 0938  Weight: 216 lb 14.4 oz (98.4 kg)   .Body mass index is 31.12 kg/m.  GENERAL:alert, NAD but anxious due to her new diagnosis. SKIN: skin color, texture, turgor are normal, no rashes or significant lesions EYES: normal, conjunctiva are pink and non-injected, sclera clear OROPHARYNX:no exudate, no erythema and lips, buccal mucosa, and tongue normal  NECK: supple, no JVD, thyroid normal size, non-tender, without nodularity LYMPH:  no palpable lymphadenopathy in the cervical, axillary. Noted to have bilateral palpable inguinal lymph nodes R>L LUNGS: clear to auscultation with normal respiratory effort HEART: regular rate & rhythm,  no murmurs and trace lower extremity edema ABDOMEN: abdomen soft, non-tender, normoactive bowel sounds, no palpable hepatosplenomegaly  Musculoskeletal: no cyanosis of digits and no clubbing  PSYCH: alert & oriented x  3 with fluent speech NEURO: no focal motor/sensory deficits  LABORATORY DATA:  I have reviewed the data as listed  . CBC Latest Ref Rng & Units 03/20/2016 01/30/2016 01/23/2016  WBC 3.9 - 10.3 10e3/uL 11.3(H) 10.2 9.8  Hemoglobin 11.6 - 15.9 g/dL 12.8 13.3 13.2  Hematocrit 34.8 - 46.6 % 39.6 41.6 41.6  Platelets 145 - 400 10e3/uL 178 182 175    . CMP Latest Ref Rng & Units 03/20/2016 01/30/2016 01/23/2016  Glucose 70 - 140 mg/dl 89 118 89  BUN 7.0 - 26.0 mg/dL 15.9 10.6 11  Creatinine 0.6 - 1.1 mg/dL 1.1 0.8 0.90  Sodium 136 - 145 mEq/L 138 143 143  Potassium 3.5 - 5.1 mEq/L 5.1 4.5 4.2  Chloride 101 - 111 mmol/L - - 107  CO2 22 - 29 mEq/L 25 27 28   Calcium 8.4 - 10.4 mg/dL 9.1 9.4 9.0  Total Protein 6.4 - 8.3 g/dL 7.3 7.0 6.2(L)  Total Bilirubin 0.20 - 1.20 mg/dL 0.27 0.42 0.5  Alkaline Phos 40 - 150 U/L 79 67 51  AST 5 - 34 U/L 21 19 22   ALT 0 -  55 U/L 18 14 17    . Lab Results  Component Value Date   LDH 200 01/30/2016          RADIOGRAPHIC STUDIES: I have personally reviewed the radiological images as listed and agreed with the findings in the report. No results found. MRI Lspine  10/31/2015 :   IMPRESSION: 1. At L4-5 there is a moderate broad-based disc bulge. Mild bilateral facet arthropathy with ligamentum flavum infolding. Severe spinal stenosis. Severe bilateral foraminal stenosis. 2. At L3-4 there is a broad-based disc bulge eccentric towards the right. Mild bilateral facet arthropathy. Bilateral lateral recess stenosis and mild spinal stenosis. Mild right foraminal stenosis. 3. At L2-3 there is a mild broad-based disc bulge flattening the ventral thecal sac. Bilateral lateral recess stenosis. Bilateral mild facet arthropathy. Mild spinal stenosis. 4. At L5-S1 there is a left paracentral disc protrusion abutting the left intraspinal S1 nerve root. 5. Retroperitoneal lymphadenopathy of uncertain etiology. Differential considerations include an inflammatory or infectious etiology versus neoplastic etiology such as lymphoma. Recommend further evaluation with a CT of the abdomen/pelvis with intravenous contrast.   Electronically Signed   By: Kathreen Devoid   On: 10/31/2015 14:54  PET/CT 12/22/2015  IMPRESSION: 1. Hypermetabolic confluent lower retroperitoneal / bilateral common iliac, right external iliac and right inguinal lymphadenopathy, consistent with lymphoma. 2. Solitary focus of osseous hypermetabolism in the left upper sacrum consistent with osseous involvement by lymphoma. 3. No metabolically active lymphoma in the neck or chest. Spleen is normal in size and metabolism. 4. Additional findings include aortic atherosclerosis, three-vessel coronary atherosclerosis and mild sigmoid diverticulosis.   Electronically Signed   By: Samantha Sorrel M.D.   On: 12/22/2015 17:21   ASSESSMENT &  PLAN:   76 year old Caucasian female with multiple chronic medical comorbidities as noted above with   #11 Newly noted extensive retroperitoneal lymphadenopathy and right inguinal lymphadenopathy.  Biopsy is suggestive of follicular lymphoma though not definitive as per the pathologist. LDH level within normal limits. CBC shows normal blood counts. Patient has some mild Type B symptoms with chills but no fevers and about a 20 pound unintentional weight loss in the last 6 months. PLan -PET/CT results discussed in details and imaging was reviewed. -Rt inguinal appears the easiest and safest to biopsy -she has been scheduled for her inguinal LN biopsy on 01/25/2016. -She would need to  hold her Eliquis for 2 days prior to her excisional lymph node biopsy. --Final treatment decision would be based on the definitive diagnosis, staging and the patient's FILIPI score and her personal treatment preference.  #2 COPD  #3 active smoker half a pack per day for about 60 years #4 degenerative arthritis - has had chronic back pain issues with multiple epidural steroid injections from 2014 [5 times] #5 glaucoma #6 degenerative arthritis #7 history of TIA #8 dyslipidemia #9 hypertension #10 coronary artery disease with inferior wall MI in the past, status post PCI to RCA in 2001 #11 hypothyroidism #12 lumbar radiculopathy/neuropathy. #13 paroxysmal atrial fibrillation on anticoagulation with Eliquis. #14 type 2 diabetes #16 obesity Plan -Continue follow-up with primary care physician for continued management of all other medical comorbidities.   urgent referral to [place] surgery for rt inguinal lymph node biopsy for likely lymphoma. -Return to care with Dr. Irene Limbo in 2-3 weeks. One week after LN biopsy.  All of the patients , her friends and family members questions were answered to their apparent satisfaction. The patient knows to call the clinic with any problems, questions or  concerns.  I spent 20 minutes counseling the patient face to face. The total time spent in the appointment was 25 minutes and more than 50% was on counseling and direct patient cares.    Sullivan Lone MD [place] AAHIVMS [facility] Hematology/Oncology Physician [facility]  (Office):       [phone] (Work cell):  [phone] (Fax):           [phone]

## 2016-03-22 ENCOUNTER — Ambulatory Visit: Payer: Medicare Other

## 2016-04-03 ENCOUNTER — Telehealth: Payer: Self-pay | Admitting: Hematology

## 2016-04-03 NOTE — Telephone Encounter (Signed)
Added tx per 1/8 sch msg. Lab/fu moved from 1/22 to 1/23 to accommodate 6hr inf. Spoke with patient re next appointment for 1/22.

## 2016-04-09 ENCOUNTER — Ambulatory Visit (HOSPITAL_BASED_OUTPATIENT_CLINIC_OR_DEPARTMENT_OTHER): Payer: Medicare Other

## 2016-04-09 ENCOUNTER — Telehealth: Payer: Self-pay | Admitting: Hematology

## 2016-04-09 ENCOUNTER — Encounter: Payer: Self-pay | Admitting: Hematology

## 2016-04-09 ENCOUNTER — Other Ambulatory Visit: Payer: Self-pay | Admitting: *Deleted

## 2016-04-09 ENCOUNTER — Ambulatory Visit (HOSPITAL_BASED_OUTPATIENT_CLINIC_OR_DEPARTMENT_OTHER): Payer: Medicare Other | Admitting: Hematology

## 2016-04-09 ENCOUNTER — Other Ambulatory Visit (HOSPITAL_BASED_OUTPATIENT_CLINIC_OR_DEPARTMENT_OTHER): Payer: Medicare Other

## 2016-04-09 VITALS — BP 112/42 | HR 59 | Temp 98.0°F | Resp 17

## 2016-04-09 VITALS — BP 150/54 | HR 59 | Resp 18 | Ht 70.0 in | Wt 216.6 lb

## 2016-04-09 DIAGNOSIS — C8298 Follicular lymphoma, unspecified, lymph nodes of multiple sites: Secondary | ICD-10-CM

## 2016-04-09 DIAGNOSIS — Z5112 Encounter for antineoplastic immunotherapy: Secondary | ICD-10-CM

## 2016-04-09 DIAGNOSIS — Z5111 Encounter for antineoplastic chemotherapy: Secondary | ICD-10-CM | POA: Diagnosis not present

## 2016-04-09 DIAGNOSIS — T8140XD Infection following a procedure, unspecified, subsequent encounter: Secondary | ICD-10-CM

## 2016-04-09 LAB — CBC WITH DIFFERENTIAL/PLATELET
BASO%: 0.3 % (ref 0.0–2.0)
Basophils Absolute: 0 10*3/uL (ref 0.0–0.1)
EOS%: 3.4 % (ref 0.0–7.0)
Eosinophils Absolute: 0.3 10*3/uL (ref 0.0–0.5)
HEMATOCRIT: 41.9 % (ref 34.8–46.6)
HGB: 13.4 g/dL (ref 11.6–15.9)
LYMPH#: 3.5 10*3/uL — AB (ref 0.9–3.3)
LYMPH%: 36.7 % (ref 14.0–49.7)
MCH: 31.2 pg (ref 25.1–34.0)
MCHC: 32 g/dL (ref 31.5–36.0)
MCV: 97.7 fL (ref 79.5–101.0)
MONO#: 0.9 10*3/uL (ref 0.1–0.9)
MONO%: 9.2 % (ref 0.0–14.0)
NEUT%: 50.4 % (ref 38.4–76.8)
NEUTROS ABS: 4.8 10*3/uL (ref 1.5–6.5)
PLATELETS: 177 10*3/uL (ref 145–400)
RBC: 4.29 10*6/uL (ref 3.70–5.45)
RDW: 14.3 % (ref 11.2–14.5)
WBC: 9.5 10*3/uL (ref 3.9–10.3)

## 2016-04-09 LAB — COMPREHENSIVE METABOLIC PANEL
ALT: 18 U/L (ref 0–55)
ANION GAP: 8 meq/L (ref 3–11)
AST: 20 U/L (ref 5–34)
Albumin: 3.6 g/dL (ref 3.5–5.0)
Alkaline Phosphatase: 62 U/L (ref 40–150)
BILIRUBIN TOTAL: 0.38 mg/dL (ref 0.20–1.20)
BUN: 13.1 mg/dL (ref 7.0–26.0)
CALCIUM: 9.2 mg/dL (ref 8.4–10.4)
CHLORIDE: 108 meq/L (ref 98–109)
CO2: 26 meq/L (ref 22–29)
CREATININE: 0.8 mg/dL (ref 0.6–1.1)
EGFR: 76 mL/min/{1.73_m2} — ABNORMAL LOW (ref >90)
Glucose: 76 mg/dl (ref 70–140)
Potassium: 4.2 mEq/L (ref 3.5–5.1)
Sodium: 142 mEq/L (ref 136–145)
TOTAL PROTEIN: 7.2 g/dL (ref 6.4–8.3)

## 2016-04-09 MED ORDER — DIPHENHYDRAMINE HCL 25 MG PO CAPS
50.0000 mg | ORAL_CAPSULE | Freq: Once | ORAL | Status: AC
  Administered 2016-04-09: 50 mg via ORAL

## 2016-04-09 MED ORDER — DEXAMETHASONE 4 MG PO TABS: 8.0000 mg | ORAL_TABLET | Freq: Every day | ORAL | 1 refills | Status: DC

## 2016-04-09 MED ORDER — DEXAMETHASONE SODIUM PHOSPHATE 10 MG/ML IJ SOLN
INTRAMUSCULAR | Status: AC
  Filled 2016-04-09: qty 1

## 2016-04-09 MED ORDER — ACETAMINOPHEN 325 MG PO TABS
ORAL_TABLET | ORAL | Status: AC
  Filled 2016-04-09: qty 2

## 2016-04-09 MED ORDER — PALONOSETRON HCL INJECTION 0.25 MG/5ML
0.2500 mg | Freq: Once | INTRAVENOUS | Status: AC
  Administered 2016-04-09: 0.25 mg via INTRAVENOUS

## 2016-04-09 MED ORDER — ACETAMINOPHEN 325 MG PO TABS
650.0000 mg | ORAL_TABLET | Freq: Once | ORAL | Status: AC
  Administered 2016-04-09: 650 mg via ORAL

## 2016-04-09 MED ORDER — PALONOSETRON HCL INJECTION 0.25 MG/5ML
INTRAVENOUS | Status: AC
  Filled 2016-04-09: qty 5

## 2016-04-09 MED ORDER — DIPHENHYDRAMINE HCL 25 MG PO CAPS
ORAL_CAPSULE | ORAL | Status: AC
  Filled 2016-04-09: qty 2

## 2016-04-09 MED ORDER — SODIUM CHLORIDE 0.9 % IV SOLN
Freq: Once | INTRAVENOUS | Status: AC
  Administered 2016-04-09: 10:00:00 via INTRAVENOUS

## 2016-04-09 MED ORDER — SODIUM CHLORIDE 0.9 % IV SOLN
375.0000 mg/m2 | Freq: Once | INTRAVENOUS | Status: AC
  Administered 2016-04-09: 800 mg via INTRAVENOUS
  Filled 2016-04-09: qty 50

## 2016-04-09 MED ORDER — SODIUM CHLORIDE 0.9 % IV SOLN
70.0000 mg/m2 | Freq: Once | INTRAVENOUS | Status: AC
  Administered 2016-04-09: 150 mg via INTRAVENOUS
  Filled 2016-04-09: qty 6

## 2016-04-09 MED ORDER — DEXAMETHASONE SODIUM PHOSPHATE 10 MG/ML IJ SOLN
10.0000 mg | Freq: Once | INTRAMUSCULAR | Status: AC
  Administered 2016-04-09: 10 mg via INTRAVENOUS

## 2016-04-09 MED ORDER — ONDANSETRON HCL 8 MG PO TABS: 8.0000 mg | ORAL_TABLET | Freq: Two times a day (BID) | ORAL | 1 refills | Status: DC | PRN

## 2016-04-09 NOTE — Telephone Encounter (Signed)
Appointments scheduled per 1/22 LOS. Patient given AVS report and calendars with future scheduled appointments.  °

## 2016-04-09 NOTE — Patient Instructions (Signed)
[facility] Discharge Instructions for Patients Receiving Chemotherapy  Today you received the following chemotherapy agents: Rituxan and Bendeka   To help prevent nausea and vomiting after your treatment, we encourage you to take your nausea medication as directed.   If you develop nausea and vomiting that is not controlled by your nausea medication, call the clinic.   BELOW ARE SYMPTOMS THAT SHOULD BE REPORTED IMMEDIATELY:  *FEVER GREATER THAN 100.5 F  *CHILLS WITH OR WITHOUT FEVER  NAUSEA AND VOMITING THAT IS NOT CONTROLLED WITH YOUR NAUSEA MEDICATION  *UNUSUAL SHORTNESS OF BREATH  *UNUSUAL BRUISING OR BLEEDING  TENDERNESS IN MOUTH AND THROAT WITH OR WITHOUT PRESENCE OF ULCERS  *URINARY PROBLEMS  *BOWEL PROBLEMS  UNUSUAL RASH Items with * indicate a potential emergency and should be followed up as soon as possible.  Feel free to call the clinic you have any questions or concerns. The clinic phone number is [phone].  Please show the [place] at check-in to the Emergency Department and triage nurse.   Rituximab injection What is this medicine? RITUXIMAB (ri TUX i mab) is a monoclonal antibody. It is used to treat non-Hodgkin lymphoma and chronic lymphocytic leukemia. It is also used to treat rheumatoid arthritis (RA). In RA, this medicine slows the inflammatory process and help reduce joint pain and swelling. This medicine is often used with other cancer or arthritis medications. This medicine may be used for other purposes; ask your health care provider or pharmacist if you have questions. COMMON BRAND NAME(S): Rituxan What should I tell my health care provider before I take this medicine? They need to know if you have any of these conditions: -heart disease -infection (especially a virus infection such as hepatitis B, chickenpox, cold sores, or herpes) -immune system problems -irregular heartbeat -kidney disease -lung or breathing  disease, like asthma -recently received or scheduled to receive a vaccine -an unusual or allergic reaction to rituximab, mouse proteins, other medicines, foods, dyes, or preservatives -pregnant or trying to get pregnant -breast-feeding How should I use this medicine? This medicine is for infusion into a vein. It is administered in a hospital or clinic by a specially trained health care professional. A special MedGuide will be given to you by the pharmacist with each prescription and refill. Be sure to read this information carefully each time. Talk to your pediatrician regarding the use of this medicine in children. This medicine is not approved for use in children. Overdosage: If you think you have taken too much of this medicine contact a poison control center or emergency room at once. NOTE: This medicine is only for you. Do not share this medicine with others. What if I miss a dose? It is important not to miss a dose. Call your doctor or health care professional if you are unable to keep an appointment. What may interact with this medicine? -cisplatin -medicines for blood pressure -some other medicines for arthritis -vaccines This list may not describe all possible interactions. Give your health care provider a list of all the medicines, herbs, non-prescription drugs, or dietary supplements you use. Also tell them if you smoke, drink alcohol, or use illegal drugs. Some items may interact with your medicine. What should I watch for while using this medicine? Report any side effects that you notice during your treatment right away, such as changes in your breathing, fever, chills, dizziness or lightheadedness. These effects are more common with the first dose. Visit your prescriber or health care professional for  checks on your progress. You will need to have regular blood work. Report any other side effects. The side effects of this medicine can continue after you finish your treatment.  Continue your course of treatment even though you feel ill unless your doctor tells you to stop. Call your doctor or health care professional for advice if you get a fever, chills or sore throat, or other symptoms of a cold or flu. Do not treat yourself. This drug decreases your body's ability to fight infections. Try to avoid being around people who are sick. This medicine may increase your risk to bruise or bleed. Call your doctor or health care professional if you notice any unusual bleeding. Be careful brushing and flossing your teeth or using a toothpick because you may get an infection or bleed more easily. If you have any dental work done, tell your dentist you are receiving this medicine. Avoid taking products that contain aspirin, acetaminophen, ibuprofen, naproxen, or ketoprofen unless instructed by your doctor. These medicines may hide a fever. Do not become pregnant while taking this medicine. Women should inform their doctor if they wish to become pregnant or think they might be pregnant. There is a potential for serious side effects to an unborn child. Talk to your health care professional or pharmacist for more information. Do not breast-feed an infant while taking this medicine. What side effects may I notice from receiving this medicine? Side effects that you should report to your doctor or health care professional as soon as possible: -allergic reactions like skin rash, itching or hives, swelling of the face, lips, or tongue -low blood counts - this medicine may decrease the number of white blood cells, red blood cells and platelets. You may be at increased risk for infections and bleeding. -signs of infection - fever or chills, cough, sore throat, pain or difficulty passing urine -signs of decreased platelets or bleeding - bruising, pinpoint red spots on the skin, black, tarry stools, blood in the urine -signs of decreased red blood cells - unusually weak or tired, fainting spells,  lightheadedness -breathing problems -confused, not responsive -chest pain -fast, irregular heartbeat -feeling faint or lightheaded, falls -mouth sores -redness, blistering, peeling or loosening of the skin, including inside the mouth -stomach pain -swelling of the ankles, feet, or hands -trouble passing urine or change in the amount of urine Side effects that usually do not require medical attention (report to your doctor or health care professional if they continue or are bothersome): -anxiety -headache -loss of appetite -muscle aches -nausea -night sweats This list may not describe all possible side effects. Call your doctor for medical advice about side effects. You may report side effects to FDA at 1-800-FDA-1088. Where should I keep my medicine? This drug is given in a hospital or clinic and will not be stored at home. NOTE: This sheet is a summary. It may not cover all possible information. If you have questions about this medicine, talk to your doctor, pharmacist, or health care provider.  2017 Elsevier/Gold Standard (2015-09-15 17:23:26)   Bendamustine Injection What is this medicine? BENDAMUSTINE (BEN da MUS teen) is a chemotherapy drug. It is used to treat chronic lymphocytic leukemia and non-Hodgkin lymphoma. COMMON BRAND NAME(S): BENDEKA, Treanda What should I tell my health care provider before I take this medicine? They need to know if you have any of these conditions: -infection (especially a virus infection such as chickenpox, cold sores, or herpes) -kidney disease -liver disease -an unusual or allergic reaction to  bendamustine, mannitol, other medicines, foods, dyes, or preservatives -pregnant or trying to get pregnant -breast-feeding How should I use this medicine? This medicine is for infusion into a vein. It is given by a health care professional in a hospital or clinic setting. Talk to your pediatrician regarding the use of this medicine in children.  Special care may be needed. What if I miss a dose? It is important not to miss your dose. Call your doctor or health care professional if you are unable to keep an appointment. What may interact with this medicine? Do not take this medicine with any of the following medications: -clozapine This medicine may also interact with the following medications: -atazanavir -cimetidine -ciprofloxacin -enoxacin -fluvoxamine -medicines for seizures like carbamazepine and phenobarbital -mexiletine -rifampin -tacrine -thiabendazole -zileuton What should I watch for while using this medicine? This drug may make you feel generally unwell. This is not uncommon, as chemotherapy can affect healthy cells as well as cancer cells. Report any side effects. Continue your course of treatment even though you feel ill unless your doctor tells you to stop. You may need blood work done while you are taking this medicine. Call your doctor or health care professional for advice if you get a fever, chills or sore throat, or other symptoms of a cold or flu. Do not treat yourself. This drug decreases your body's ability to fight infections. Try to avoid being around people who are sick. This medicine may increase your risk to bruise or bleed. Call your doctor or health care professional if you notice any unusual bleeding. Talk to your doctor about your risk of cancer. You may be more at risk for certain types of cancers if you take this medicine. Do not become pregnant while taking this medicine or for 3 months after stopping it. Women should inform their doctor if they wish to become pregnant or think they might be pregnant. Men should not father a child while taking this medicine and for 3 months after stopping it.There is a potential for serious side effects to an unborn child. Talk to your health care professional or pharmacist for more information. Do not breast-feed an infant while taking this medicine. This medicine  may interfere with the ability to have a child. You should talk with your doctor or health care professional if you are concerned about your fertility. What side effects may I notice from receiving this medicine? Side effects that you should report to your doctor or health care professional as soon as possible: -allergic reactions like skin rash, itching or hives, swelling of the face, lips, or tongue -low blood counts - this medicine may decrease the number of white blood cells, red blood cells and platelets. You may be at increased risk for infections and bleeding. -redness, blistering, peeling or loosening of the skin, including inside the mouth -signs of infection - fever or chills, cough, sore throat, pain or difficulty passing urine -signs of decreased platelets or bleeding - bruising, pinpoint red spots on the skin, black, tarry stools, blood in the urine -signs of decreased red blood cells - unusually weak or tired, fainting spells, lightheadedness -signs and symptoms of kidney injury like trouble passing urine or change in the amount of urine -signs and symptoms of liver injury like dark yellow or brown urine; general ill feeling or flu-like symptoms; light-colored stools; loss of appetite; nausea; right upper belly pain; unusually weak or tired; yellowing of the eyes or skin Side effects that usually do not require medical  attention (report to your doctor or health care professional if they continue or are bothersome): -constipation -decreased appetite -diarrhea -headache -mouth sores -nausea/vomiting -tiredness Where should I keep my medicine? This drug is given in a hospital or clinic and will not be stored at home.  2017 Elsevier/Gold Standard (2015-01-06 08:45:41)

## 2016-04-09 NOTE — Progress Notes (Signed)
HEMATOLOGY/ONCOLOGY CLINIC NOTE  Date of Service: .04/09/2016  Patient Care Team: Charolette Forward, MD as PCP - General (Cardiology) Physical medicine and rehabilitation: Sharlet Salina DO  CHIEF COMPLAINTS/PURPOSE OF CONSULTATION:  Newly diagnosed lymphoma  HISTORY OF PRESENTING ILLNESS:   Samantha [name] is a wonderful 76 y.o. female who has been referred to [place] by Dr .Charolette Forward, MD for evaluation and management of newly diagnosed lymphoma.   Patient has multiple medical comorbidities including history of hypertension, diabetes, dyslipidemia, coronary disease status post PCI in 2001, paroxysmal atrial fibrillation on anticoagulation and chronic low back pain due to degenerative degenerative disc disease with lumbar radiculopathy.  Patient notes increasing lower back pain for which she had an MRI of the lumbar spine without contrast on 10/31/2015 which showed retroperitoneal lymphadenopathy in addition to her degenerative disease. A CT of the abdomen and pelvis was done with contrast on 11/17/2015 and showed retroperitoneal lymphadenopathy with multiple nodes directly adjacent to the aorta and inferior vena cava. The adenopathy extends along the right common iliac chain as well as noted a right inguinal lymph node 12.5 mm in short axis.  Patient previously had a CT of the chest without contrast on 09/19/2015 to evaluate left lower chest wall pain which showed no evidence of lymphadenopathy or other acute cardiopulmonary disease.  Patient notes some chills at night and night sweats. Hasn't lost about 20 pounds of weight in the last 6 months down from 234 pounds to about 215 pounds. No overt fevers. Some increase in fatigue. Has been drinking ensure.  Patient subsequently had an ultrasound-guided biopsy of a right inguinal lymph node that shows atypical lymphoid proliferation and clonal B-cell population with kappa light chain excess. Overall picture is suggestive of follicular lymphoma  but was not definitive. The pathologist recommended excisional lymph node biopsy for definitive diagnosis.  Is accompanied by her friend and another family member for this clinic visit.  He had several questions which were answered in details.  INTERVAL HISTORY  Patient is here with her friend for followup of her follicular lymphoma. Her right groin postoperative infected seroma has now resolved and wound has closed with no redness induration or discharge . Patient is keen to get started with her treatment no other acute new symptoms. Minimal night sweats. Eating well.   MEDICAL HISTORY:  Past Medical History:  Diagnosis Date  . A-fib ([place])   . Arthritis   . Chronic back pain   . COPD (chronic obstructive pulmonary disease) ([place])   . Coronary artery disease   . Glaucoma   . Hearing aid worn   . High cholesterol   . Hypertension   . Lymphadenopathy 01/25/2016  . Myocardial infarction    2000  . Non Hodgkin's lymphoma ([place])   . Wears dentures    " Top plate"  . Wears glasses   #1 bilateral cataracts #2 COPD  #3 active smoker half a pack per day for about 60 years #4 degenerative arthritis - has had chronic back pain issues with multiple epidural steroid injections from 2014 [5 times] #5 glaucoma #6 degenerative arthritis #7 history of TIA #8 dyslipidemia #9 hypertension #10 coronary artery disease with inferior wall MI in the past, status post PCI to RCA in 2001 #11 hypothyroidism #12 lumbar radiculopathy/neuropathy. #13 paroxysmal atrial fibrillation on anticoagulation with Eliquis. #14 type 2 diabetes #16 obesity  SURGICAL HISTORY:  #1 cardiac catheterization #2 cholecystectomy #3 colonoscopy #4 wisdom teeth extraction.  SOCIAL HISTORY: Currently single. Notes that  she lost her female partner 9 months ago. Active smoker half pack per day since her teens  FAMILY HISTORY: Patient reports no history of cancers or blood disorders in the family that she is aware  of.  ALLERGIES:  is allergic to hydrocodone and oxycodone.  MEDICATIONS:  Current Outpatient Prescriptions  Medication Sig Dispense Refill  . acyclovir (ZOVIRAX) 400 MG tablet Take 1 tablet (400 mg total) by mouth daily. 30 tablet 3  . ALPRAZolam (XANAX) 0.25 MG tablet Take 0.25 mg by mouth daily as needed for anxiety.     Marland Kitchen amiodarone (PACERONE) 200 MG tablet Take 100 mg by mouth every morning.    Marland Kitchen atorvastatin (LIPITOR) 40 MG tablet Take 40 mg by mouth every morning.     . Bimatoprost (LUMIGAN OP) Place 1 drop into both eyes at bedtime.    Marland Kitchen dexamethasone (DECADRON) 4 MG tablet Take 2 tablets (8 mg total) by mouth daily. Start the day after bendamustine chemotherapy for 2 days. Take with food. 30 tablet 1  . ELIQUIS 5 MG TABS tablet Take 5 mg by mouth 2 (two) times daily.    Marland Kitchen gabapentin (NEURONTIN) 300 MG capsule Take 300 mg by mouth 2 (two) times daily.    . Hypromellose (ARTIFICIAL TEARS OP) Place 1 drop into both eyes daily as needed. Dry eyes    . losartan (COZAAR) 50 MG tablet Take 50 mg by mouth every morning.    . Melatonin 5 MG TABS Take 5 mg by mouth at bedtime.    . meloxicam (MOBIC) 7.5 MG tablet Take 7.5 mg by mouth at bedtime.    . metoprolol succinate (TOPROL-XL) 50 MG 24 hr tablet Take 50 mg by mouth daily.    . nitroGLYCERIN (NITROSTAT) 0.4 MG SL tablet Place 0.4 mg under the tongue every 5 (five) minutes x 3 doses as needed for chest pain.     Marland Kitchen ondansetron (ZOFRAN) 8 MG tablet Take 1 tablet (8 mg total) by mouth 2 (two) times daily as needed for refractory nausea / vomiting. Start on day 2 after bendamustine chemo 30 tablet 1  . sulfamethoxazole-trimethoprim (BACTRIM DS,SEPTRA DS) 800-160 MG tablet     . traMADol (ULTRAM) 50 MG tablet Take 1-2 tablets (50-100 mg total) by mouth every 6 (six) hours as needed. 30 tablet 1   No current facility-administered medications for this visit.     REVIEW OF SYSTEMS:    10 Point review of Systems was done is negative except as  noted above.  PHYSICAL EXAMINATION: ECOG PERFORMANCE STATUS: 1 - Symptomatic but completely ambulatory  . Vitals:   04/09/16 0830  BP: (!) 150/54  Pulse: (!) 59  Resp: 18   Filed Weights   04/09/16 0830  Weight: 216 lb 9.6 oz (98.2 kg)   .Body mass index is 31.08 kg/m.  GENERAL:alert, NAD but anxious due to her new diagnosis. SKIN: some indurated at the site of the rt inguinal LN biopsy with some serous drainage. EYES: normal, conjunctiva are pink and non-injected, sclera clear OROPHARYNX:no exudate, no erythema and lips, buccal mucosa, and tongue normal  NECK: supple, no JVD, thyroid normal size, non-tender, without nodularity LYMPH:  no palpable lymphadenopathy in the cervical, axillary. Noted to have bilateral palpable inguinal lymph nodes R>L LUNGS: clear to auscultation with normal respiratory effort HEART: regular rate & rhythm,  no murmurs and trace lower extremity edema ABDOMEN: abdomen soft, non-tender, normoactive bowel sounds, no palpable hepatosplenomegaly  Musculoskeletal: no cyanosis of digits and no clubbing  PSYCH: alert & oriented x 3 with fluent speech NEURO: no focal motor/sensory deficits  LABORATORY DATA:  I have reviewed the data as listed  . CBC Latest Ref Rng & Units 03/20/2016 01/30/2016 01/23/2016  WBC 3.9 - 10.3 10e3/uL 11.3(H) 10.2 9.8  Hemoglobin 11.6 - 15.9 g/dL 12.8 13.3 13.2  Hematocrit 34.8 - 46.6 % 39.6 41.6 41.6  Platelets 145 - 400 10e3/uL 178 182 175    . CMP Latest Ref Rng & Units 03/20/2016 01/30/2016 01/23/2016  Glucose 70 - 140 mg/dl 89 118 89  BUN 7.0 - 26.0 mg/dL 15.9 10.6 11  Creatinine 0.6 - 1.1 mg/dL 1.1 0.8 0.90  Sodium 136 - 145 mEq/L 138 143 143  Potassium 3.5 - 5.1 mEq/L 5.1 4.5 4.2  Chloride 101 - 111 mmol/L - - 107  CO2 22 - 29 mEq/L 25 27 28   Calcium 8.4 - 10.4 mg/dL 9.1 9.4 9.0  Total Protein 6.4 - 8.3 g/dL 7.3 7.0 6.2(L)  Total Bilirubin 0.20 - 1.20 mg/dL 0.27 0.42 0.5  Alkaline Phos 40 - 150 U/L 79 67 51  AST 5  - 34 U/L 21 19 22   ALT 0 - 55 U/L 18 14 17    . Lab Results  Component Value Date   LDH 200 01/30/2016            RADIOGRAPHIC STUDIES: I have personally reviewed the radiological images as listed and agreed with the findings in the report. No results found. MRI Lspine  10/31/2015 :   IMPRESSION: 1. At L4-5 there is a moderate broad-based disc bulge. Mild bilateral facet arthropathy with ligamentum flavum infolding. Severe spinal stenosis. Severe bilateral foraminal stenosis. 2. At L3-4 there is a broad-based disc bulge eccentric towards the right. Mild bilateral facet arthropathy. Bilateral lateral recess stenosis and mild spinal stenosis. Mild right foraminal stenosis. 3. At L2-3 there is a mild broad-based disc bulge flattening the ventral thecal sac. Bilateral lateral recess stenosis. Bilateral mild facet arthropathy. Mild spinal stenosis. 4. At L5-S1 there is a left paracentral disc protrusion abutting the left intraspinal S1 nerve root. 5. Retroperitoneal lymphadenopathy of uncertain etiology. Differential considerations include an inflammatory or infectious etiology versus neoplastic etiology such as lymphoma. Recommend further evaluation with a CT of the abdomen/pelvis with intravenous contrast.   Electronically Signed   By: Kathreen Devoid   On: 10/31/2015 14:54  PET/CT 12/22/2015  IMPRESSION: 1. Hypermetabolic confluent lower retroperitoneal / bilateral common iliac, right external iliac and right inguinal lymphadenopathy, consistent with lymphoma. 2. Solitary focus of osseous hypermetabolism in the left upper sacrum consistent with osseous involvement by lymphoma. 3. No metabolically active lymphoma in the neck or chest. Spleen is normal in size and metabolism. 4. Additional findings include aortic atherosclerosis, three-vessel coronary atherosclerosis and mild sigmoid diverticulosis.   Electronically Signed   By: Ilona Sorrel M.D.   On:  12/22/2015 17:21   ASSESSMENT & PLAN:   76 year old Caucasian female with multiple chronic medical comorbidities as noted above with   #1 Newly noted Stage IVB Low grade Follicular Lymphoma with extensive retroperitoneal lymphadenopathy and right inguinal lymphadenopathy as well as osseous involvement of the sacrum. LDH level within normal limits.  #2 Rt inguinal LN biopsy site infection -now resolved Plan - patient appropriate to start her first cycle of BR pending labs from today . - Has completed her antibiotics for a right inguinal infected seroma and this has resolved . -We discussed appropriate use of antinausea medications and other supportive medications . -counseled on  smoking cessation since this an additional factor in poor wound healing. -optimize po intake and fluids.  Return to care with Dr. Irene Limbo in 2 weeks for toxicity check with repeat labs Continue chemotherapy Bendamustine/Rituxan as per orders and labs with every chemotherapy q4weeks  All of the patients , her friends and family members questions were answered to their apparent satisfaction. The patient knows to call the clinic with any problems, questions or concerns.  I spent 20 minutes counseling the patient face to face. The total time spent in the appointment was 20 minutes and more than 50% was on counseling and direct patient cares.    Sullivan Lone MD [place] AAHIVMS [facility] Hematology/Oncology Physician [facility]  (Office):       [phone] (Work cell):  [phone] (Fax):           [phone]

## 2016-04-09 NOTE — Progress Notes (Signed)
Pt tolerated infusion well. Verbal and printed discharge instructions given to pt, pt verbalizes understanding. Pt and VS stable at discharge.

## 2016-04-10 ENCOUNTER — Telehealth: Payer: Self-pay | Admitting: *Deleted

## 2016-04-10 ENCOUNTER — Other Ambulatory Visit: Payer: Self-pay | Admitting: *Deleted

## 2016-04-10 ENCOUNTER — Ambulatory Visit (HOSPITAL_BASED_OUTPATIENT_CLINIC_OR_DEPARTMENT_OTHER): Payer: Medicare Other

## 2016-04-10 ENCOUNTER — Other Ambulatory Visit: Payer: Medicare Other

## 2016-04-10 ENCOUNTER — Ambulatory Visit: Payer: Medicare Other | Admitting: Hematology

## 2016-04-10 VITALS — BP 121/71 | HR 56 | Temp 98.7°F | Resp 18

## 2016-04-10 DIAGNOSIS — Z5111 Encounter for antineoplastic chemotherapy: Secondary | ICD-10-CM

## 2016-04-10 DIAGNOSIS — C8298 Follicular lymphoma, unspecified, lymph nodes of multiple sites: Secondary | ICD-10-CM

## 2016-04-10 MED ORDER — DEXAMETHASONE SODIUM PHOSPHATE 10 MG/ML IJ SOLN
INTRAMUSCULAR | Status: AC
  Filled 2016-04-10: qty 1

## 2016-04-10 MED ORDER — PROCHLORPERAZINE MALEATE 10 MG PO TABS: 10.0000 mg | ORAL_TABLET | Freq: Four times a day (QID) | ORAL | 0 refills | Status: DC | PRN

## 2016-04-10 MED ORDER — SODIUM CHLORIDE 0.9 % IV SOLN
Freq: Once | INTRAVENOUS | Status: AC
  Administered 2016-04-10: 14:00:00 via INTRAVENOUS

## 2016-04-10 MED ORDER — SODIUM CHLORIDE 0.9 % IV SOLN
70.0000 mg/m2 | Freq: Once | INTRAVENOUS | Status: AC
  Administered 2016-04-10: 150 mg via INTRAVENOUS
  Filled 2016-04-10: qty 6

## 2016-04-10 MED ORDER — DEXAMETHASONE SODIUM PHOSPHATE 10 MG/ML IJ SOLN
10.0000 mg | Freq: Once | INTRAMUSCULAR | Status: AC
  Administered 2016-04-10: 10 mg via INTRAVENOUS

## 2016-04-10 NOTE — Patient Instructions (Signed)
[place] Discharge Instructions for Patients Receiving Chemotherapy  Today you received the following chemotherapy agentsBENDAMUASTINE To help prevent nausea and vomiting after your treatment, we encourage you to take your nausea medication as prescribed.  If you develop nausea and vomiting that is not controlled by your nausea medication, call the clinic.   BELOW ARE SYMPTOMS THAT SHOULD BE REPORTED IMMEDIATELY:  *FEVER GREATER THAN 100.5 F  *CHILLS WITH OR WITHOUT FEVER  NAUSEA AND VOMITING THAT IS NOT CONTROLLED WITH YOUR NAUSEA MEDICATION  *UNUSUAL SHORTNESS OF BREATH  *UNUSUAL BRUISING OR BLEEDING  TENDERNESS IN MOUTH AND THROAT WITH OR WITHOUT PRESENCE OF ULCERS  *URINARY PROBLEMS  *BOWEL PROBLEMS  UNUSUAL RASH Items with * indicate a potential emergency and should be followed up as soon as possible.  Feel free to call the clinic you have any questions or concerns. The clinic phone number is [phone].  Please show the [place] at check-in to the Emergency Department and triage nurse.

## 2016-04-10 NOTE — Telephone Encounter (Signed)
Per 1/22 LOS and staff message I have scheduled appts and gave calendar

## 2016-04-23 ENCOUNTER — Encounter: Payer: Self-pay | Admitting: Hematology

## 2016-04-23 ENCOUNTER — Other Ambulatory Visit (HOSPITAL_BASED_OUTPATIENT_CLINIC_OR_DEPARTMENT_OTHER): Payer: Medicare Other

## 2016-04-23 ENCOUNTER — Ambulatory Visit (HOSPITAL_BASED_OUTPATIENT_CLINIC_OR_DEPARTMENT_OTHER): Payer: Medicare Other | Admitting: Hematology

## 2016-04-23 VITALS — BP 123/45 | HR 62 | Temp 97.8°F | Resp 16 | Ht 70.0 in | Wt 212.5 lb

## 2016-04-23 DIAGNOSIS — C8298 Follicular lymphoma, unspecified, lymph nodes of multiple sites: Secondary | ICD-10-CM | POA: Diagnosis not present

## 2016-04-23 LAB — CBC & DIFF AND RETIC
BASO%: 0.4 % (ref 0.0–2.0)
Basophils Absolute: 0 10*3/uL (ref 0.0–0.1)
EOS%: 3.5 % (ref 0.0–7.0)
Eosinophils Absolute: 0.3 10*3/uL (ref 0.0–0.5)
HCT: 40 % (ref 34.8–46.6)
HGB: 12.8 g/dL (ref 11.6–15.9)
IMMATURE RETIC FRACT: 3.2 % (ref 1.60–10.00)
LYMPH#: 1.1 10*3/uL (ref 0.9–3.3)
LYMPH%: 11.4 % — ABNORMAL LOW (ref 14.0–49.7)
MCH: 31.5 pg (ref 25.1–34.0)
MCHC: 32 g/dL (ref 31.5–36.0)
MCV: 98.5 fL (ref 79.5–101.0)
MONO#: 0.9 10*3/uL (ref 0.1–0.9)
MONO%: 9.5 % (ref 0.0–14.0)
NEUT#: 7.4 10*3/uL — ABNORMAL HIGH (ref 1.5–6.5)
NEUT%: 75.2 % (ref 38.4–76.8)
PLATELETS: 173 10*3/uL (ref 145–400)
RBC: 4.06 10*6/uL (ref 3.70–5.45)
RDW: 14.5 % (ref 11.2–14.5)
RETIC %: 1.04 % (ref 0.70–2.10)
RETIC CT ABS: 42.22 10*3/uL (ref 33.70–90.70)
WBC: 9.8 10*3/uL (ref 3.9–10.3)

## 2016-04-23 LAB — COMPREHENSIVE METABOLIC PANEL
ALT: 15 U/L (ref 0–55)
ANION GAP: 6 meq/L (ref 3–11)
AST: 14 U/L (ref 5–34)
Albumin: 3.3 g/dL — ABNORMAL LOW (ref 3.5–5.0)
Alkaline Phosphatase: 57 U/L (ref 40–150)
BUN: 13.8 mg/dL (ref 7.0–26.0)
CHLORIDE: 106 meq/L (ref 98–109)
CO2: 29 meq/L (ref 22–29)
CREATININE: 0.9 mg/dL (ref 0.6–1.1)
Calcium: 9.6 mg/dL (ref 8.4–10.4)
EGFR: 65 mL/min/{1.73_m2} — ABNORMAL LOW (ref >90)
Glucose: 101 mg/dl (ref 70–140)
POTASSIUM: 4.5 meq/L (ref 3.5–5.1)
Sodium: 142 mEq/L (ref 136–145)
Total Bilirubin: 0.36 mg/dL (ref 0.20–1.20)
Total Protein: 6.6 g/dL (ref 6.4–8.3)

## 2016-04-23 LAB — TECHNOLOGIST REVIEW

## 2016-05-02 ENCOUNTER — Telehealth: Payer: Self-pay | Admitting: *Deleted

## 2016-05-02 NOTE — Telephone Encounter (Signed)
Per 2/6 LOS and staff message I have scheduled appt. Notified the scheduler

## 2016-05-06 NOTE — Progress Notes (Signed)
HEMATOLOGY/ONCOLOGY CLINIC NOTE  Date of Service: .04/23/2016  Patient Care Team: Charolette Forward, MD as PCP - General (Cardiology) Physical medicine and rehabilitation: Sharlet Salina DO  CHIEF COMPLAINTS/PURPOSE OF CONSULTATION:  Newly diagnosed lymphoma  HISTORY OF PRESENTING ILLNESS:   Samantha [name] is a wonderful 76 y.o. female who has been referred to [place] by Dr .Charolette Forward, MD for evaluation and management of newly diagnosed lymphoma.   Patient has multiple medical comorbidities including history of hypertension, diabetes, dyslipidemia, coronary disease status post PCI in 2001, paroxysmal atrial fibrillation on anticoagulation and chronic low back pain due to degenerative degenerative disc disease with lumbar radiculopathy.  Patient notes increasing lower back pain for which she had an MRI of the lumbar spine without contrast on 10/31/2015 which showed retroperitoneal lymphadenopathy in addition to her degenerative disease. A CT of the abdomen and pelvis was done with contrast on 11/17/2015 and showed retroperitoneal lymphadenopathy with multiple nodes directly adjacent to the aorta and inferior vena cava. The adenopathy extends along the right common iliac chain as well as noted a right inguinal lymph node 12.5 mm in short axis.  Patient previously had a CT of the chest without contrast on 09/19/2015 to evaluate left lower chest wall pain which showed no evidence of lymphadenopathy or other acute cardiopulmonary disease.  Patient notes some chills at night and night sweats. Hasn't lost about 20 pounds of weight in the last 6 months down from 234 pounds to about 215 pounds. No overt fevers. Some increase in fatigue. Has been drinking ensure.  Patient subsequently had an ultrasound-guided biopsy of a right inguinal lymph node that shows atypical lymphoid proliferation and clonal B-cell population with kappa light chain excess. Overall picture is suggestive of follicular lymphoma  but was not definitive. The pathologist recommended excisional lymph node biopsy for definitive diagnosis.  Is accompanied by her friend and another family member for this clinic visit.  He had several questions which were answered in details.  INTERVAL HISTORY  Patient is here with her friend for a toxicity check after her first cycle of BR. Notes no uncontrolled nausea/vomting. Mild grade 1 fatigue. No fevers/chills. Night sweats improving. Rt inguinal seroma resolved. Still smoking and not keen to give this up.   MEDICAL HISTORY:  Past Medical History:  Diagnosis Date  . A-fib ([place])   . Arthritis   . Chronic back pain   . COPD (chronic obstructive pulmonary disease) ([place])   . Coronary artery disease   . Glaucoma   . Hearing aid worn   . High cholesterol   . Hypertension   . Lymphadenopathy 01/25/2016  . Myocardial infarction    2000  . Non Hodgkin's lymphoma ([place])   . Wears dentures    " Top plate"  . Wears glasses   #1 bilateral cataracts #2 COPD  #3 active smoker half a pack per day for about 60 years #4 degenerative arthritis - has had chronic back pain issues with multiple epidural steroid injections from 2014 [5 times] #5 glaucoma #6 degenerative arthritis #7 history of TIA #8 dyslipidemia #9 hypertension #10 coronary artery disease with inferior wall MI in the past, status post PCI to RCA in 2001 #11 hypothyroidism #12 lumbar radiculopathy/neuropathy. #13 paroxysmal atrial fibrillation on anticoagulation with Eliquis. #14 type 2 diabetes #16 obesity  SURGICAL HISTORY:  #1 cardiac catheterization #2 cholecystectomy #3 colonoscopy #4 wisdom teeth extraction.  SOCIAL HISTORY: Currently single. Notes that she lost her female partner 9 months ago. Active  smoker half pack per day since her teens  FAMILY HISTORY: Patient reports no history of cancers or blood disorders in the family that she is aware of.  ALLERGIES:  is allergic to hydrocodone and  oxycodone.  MEDICATIONS:  Current Outpatient Prescriptions  Medication Sig Dispense Refill  . acyclovir (ZOVIRAX) 400 MG tablet Take 1 tablet (400 mg total) by mouth daily. 30 tablet 3  . ALPRAZolam (XANAX) 0.25 MG tablet Take 0.25 mg by mouth daily as needed for anxiety.     Marland Kitchen amiodarone (PACERONE) 200 MG tablet Take 100 mg by mouth every morning.    Marland Kitchen atorvastatin (LIPITOR) 40 MG tablet Take 40 mg by mouth every morning.     . Bimatoprost (LUMIGAN OP) Place 1 drop into both eyes at bedtime.    Marland Kitchen dexamethasone (DECADRON) 4 MG tablet Take 2 tablets (8 mg total) by mouth daily. Start the day after bendamustine chemotherapy for 2 days. Take with food. 30 tablet 1  . ELIQUIS 5 MG TABS tablet Take 5 mg by mouth 2 (two) times daily.    Marland Kitchen gabapentin (NEURONTIN) 300 MG capsule Take 300 mg by mouth 2 (two) times daily.    . Hypromellose (ARTIFICIAL TEARS OP) Place 1 drop into both eyes daily as needed. Dry eyes    . losartan (COZAAR) 50 MG tablet Take 50 mg by mouth every morning.    . Melatonin 5 MG TABS Take 5 mg by mouth at bedtime.    . meloxicam (MOBIC) 7.5 MG tablet Take 7.5 mg by mouth at bedtime.    . metoprolol succinate (TOPROL-XL) 50 MG 24 hr tablet Take 50 mg by mouth daily.    . nitroGLYCERIN (NITROSTAT) 0.4 MG SL tablet Place 0.4 mg under the tongue every 5 (five) minutes x 3 doses as needed for chest pain.     Marland Kitchen ondansetron (ZOFRAN) 8 MG tablet Take 1 tablet (8 mg total) by mouth 2 (two) times daily as needed for refractory nausea / vomiting. Start on day 2 after bendamustine chemo 30 tablet 1  . prochlorperazine (COMPAZINE) 10 MG tablet Take 1 tablet (10 mg total) by mouth every 6 (six) hours as needed for nausea or vomiting. 30 tablet 0  . sulfamethoxazole-trimethoprim (BACTRIM DS,SEPTRA DS) 800-160 MG tablet     . traMADol (ULTRAM) 50 MG tablet Take 1-2 tablets (50-100 mg total) by mouth every 6 (six) hours as needed. 30 tablet 1   No current facility-administered medications for  this visit.     REVIEW OF SYSTEMS:    10 Point review of Systems was done is negative except as noted above.  PHYSICAL EXAMINATION: ECOG PERFORMANCE STATUS: 1 - Symptomatic but completely ambulatory  . Vitals:   04/23/16 0958  BP: (!) 123/45  Pulse: 62  Resp: 16  Temp: 97.8 F (36.6 C)   Filed Weights   04/23/16 0958  Weight: 212 lb 8 oz (96.4 kg)   .Body mass index is 30.49 kg/m.  GENERAL:alert, NAD but anxious due to her new diagnosis. SKIN: some indurated at the site of the rt inguinal LN biopsy with some serous drainage. EYES: normal, conjunctiva are pink and non-injected, sclera clear OROPHARYNX:no exudate, no erythema and lips, buccal mucosa, and tongue normal  NECK: supple, no JVD, thyroid normal size, non-tender, without nodularity LYMPH:  no palpable lymphadenopathy in the cervical, axillary. Noted to have bilateral palpable inguinal lymph nodes R>L LUNGS: clear to auscultation with normal respiratory effort HEART: regular rate & rhythm,  no murmurs and  trace lower extremity edema ABDOMEN: abdomen soft, non-tender, normoactive bowel sounds, no palpable hepatosplenomegaly  Musculoskeletal: no cyanosis of digits and no clubbing  PSYCH: alert & oriented x 3 with fluent speech NEURO: no focal motor/sensory deficits  LABORATORY DATA:  I have reviewed the data as listed  . CBC Latest Ref Rng & Units 04/23/2016 04/09/2016 03/20/2016  WBC 3.9 - 10.3 10e3/uL 9.8 9.5 11.3(H)  Hemoglobin 11.6 - 15.9 g/dL 12.8 13.4 12.8  Hematocrit 34.8 - 46.6 % 40.0 41.9 39.6  Platelets 145 - 400 10e3/uL 173 177 178   . CBC    Component Value Date/Time   WBC 9.8 04/23/2016 0916   WBC 9.8 01/23/2016 1023   RBC 4.06 04/23/2016 0916   RBC 4.26 01/23/2016 1023   HGB 12.8 04/23/2016 0916   HCT 40.0 04/23/2016 0916   PLT 173 04/23/2016 0916   MCV 98.5 04/23/2016 0916   MCH 31.5 04/23/2016 0916   MCH 31.0 01/23/2016 1023   MCHC 32.0 04/23/2016 0916   MCHC 31.7 01/23/2016 1023   RDW  14.5 04/23/2016 0916   LYMPHSABS 1.1 04/23/2016 0916   MONOABS 0.9 04/23/2016 0916   EOSABS 0.3 04/23/2016 0916   BASOSABS 0.0 04/23/2016 0916    . CMP Latest Ref Rng & Units 04/23/2016 04/09/2016 03/20/2016  Glucose 70 - 140 mg/dl 101 76 89  BUN 7.0 - 26.0 mg/dL 13.8 13.1 15.9  Creatinine 0.6 - 1.1 mg/dL 0.9 0.8 1.1  Sodium 136 - 145 mEq/L 142 142 138  Potassium 3.5 - 5.1 mEq/L 4.5 4.2 5.1  Chloride 101 - 111 mmol/L - - -  CO2 22 - 29 mEq/L 29 26 25   Calcium 8.4 - 10.4 mg/dL 9.6 9.2 9.1  Total Protein 6.4 - 8.3 g/dL 6.6 7.2 7.3  Total Bilirubin 0.20 - 1.20 mg/dL 0.36 0.38 0.27  Alkaline Phos 40 - 150 U/L 57 62 79  AST 5 - 34 U/L 14 20 21   ALT 0 - 55 U/L 15 18 18    . Lab Results  Component Value Date   LDH 200 01/30/2016            RADIOGRAPHIC STUDIES: I have personally reviewed the radiological images as listed and agreed with the findings in the report. No results found. MRI Lspine  10/31/2015 :   IMPRESSION: 1. At L4-5 there is a moderate broad-based disc bulge. Mild bilateral facet arthropathy with ligamentum flavum infolding. Severe spinal stenosis. Severe bilateral foraminal stenosis. 2. At L3-4 there is a broad-based disc bulge eccentric towards the right. Mild bilateral facet arthropathy. Bilateral lateral recess stenosis and mild spinal stenosis. Mild right foraminal stenosis. 3. At L2-3 there is a mild broad-based disc bulge flattening the ventral thecal sac. Bilateral lateral recess stenosis. Bilateral mild facet arthropathy. Mild spinal stenosis. 4. At L5-S1 there is a left paracentral disc protrusion abutting the left intraspinal S1 nerve root. 5. Retroperitoneal lymphadenopathy of uncertain etiology. Differential considerations include an inflammatory or infectious etiology versus neoplastic etiology such as lymphoma. Recommend further evaluation with a CT of the abdomen/pelvis with intravenous contrast.   Electronically Signed   By: Kathreen Devoid   On: 10/31/2015 14:54  PET/CT 12/22/2015  IMPRESSION: 1. Hypermetabolic confluent lower retroperitoneal / bilateral common iliac, right external iliac and right inguinal lymphadenopathy, consistent with lymphoma. 2. Solitary focus of osseous hypermetabolism in the left upper sacrum consistent with osseous involvement by lymphoma. 3. No metabolically active lymphoma in the neck or chest. Spleen is normal in size and metabolism.  4. Additional findings include aortic atherosclerosis, three-vessel coronary atherosclerosis and mild sigmoid diverticulosis.   Electronically Signed   By: Ilona Sorrel M.D.   On: 12/22/2015 17:21   ASSESSMENT & PLAN:   76 year old Caucasian female with multiple chronic medical comorbidities as noted above with   #1 Stage IVB Low grade Follicular Lymphoma with extensive retroperitoneal lymphadenopathy and right inguinal lymphadenopathy as well as osseous involvement of the sacrum. LDH level within normal limits.  #2 Rt inguinal LN biopsy site infection and seroma -now resolved Plan -patient received 1st cycle of BR. No prohibitive toxicities and labs stable. -continue current supportive medications -infection prevention strategies. -counseled on smoking cessation  -optimize po intake -continue BR as per schedule -RTC with Dr Irene Limbo on [place] with labs (05/07/2016)   All of the patients , her friends and family members questions were answered to their apparent satisfaction. The patient knows to call the clinic with any problems, questions or concerns.  I spent 20 minutes counseling the patient face to face. The total time spent in the appointment was 20 minutes and more than 50% was on counseling and direct patient cares.    Sullivan Lone MD [place] AAHIVMS [facility] Hematology/Oncology Physician [facility]  (Office):       [phone] (Work cell):  [phone] (Fax):           [phone]

## 2016-05-07 ENCOUNTER — Other Ambulatory Visit (HOSPITAL_BASED_OUTPATIENT_CLINIC_OR_DEPARTMENT_OTHER): Payer: Medicare Other

## 2016-05-07 ENCOUNTER — Ambulatory Visit (HOSPITAL_BASED_OUTPATIENT_CLINIC_OR_DEPARTMENT_OTHER): Payer: Medicare Other

## 2016-05-07 ENCOUNTER — Ambulatory Visit (HOSPITAL_BASED_OUTPATIENT_CLINIC_OR_DEPARTMENT_OTHER): Payer: Medicare Other | Admitting: Hematology

## 2016-05-07 ENCOUNTER — Ambulatory Visit: Payer: Medicare Other

## 2016-05-07 ENCOUNTER — Telehealth: Payer: Self-pay | Admitting: *Deleted

## 2016-05-07 ENCOUNTER — Telehealth: Payer: Self-pay | Admitting: Hematology

## 2016-05-07 ENCOUNTER — Encounter: Payer: Self-pay | Admitting: Hematology

## 2016-05-07 VITALS — BP 128/76 | HR 60 | Temp 97.5°F | Resp 16 | Wt 212.4 lb

## 2016-05-07 VITALS — BP 121/57 | HR 60 | Temp 98.2°F | Resp 16

## 2016-05-07 DIAGNOSIS — Z5112 Encounter for antineoplastic immunotherapy: Secondary | ICD-10-CM

## 2016-05-07 DIAGNOSIS — F17200 Nicotine dependence, unspecified, uncomplicated: Secondary | ICD-10-CM

## 2016-05-07 DIAGNOSIS — Z720 Tobacco use: Secondary | ICD-10-CM

## 2016-05-07 DIAGNOSIS — C8298 Follicular lymphoma, unspecified, lymph nodes of multiple sites: Secondary | ICD-10-CM

## 2016-05-07 DIAGNOSIS — Z5111 Encounter for antineoplastic chemotherapy: Secondary | ICD-10-CM | POA: Diagnosis not present

## 2016-05-07 LAB — CBC & DIFF AND RETIC
BASO%: 0.6 % (ref 0.0–2.0)
Basophils Absolute: 0 10*3/uL (ref 0.0–0.1)
EOS ABS: 0.4 10*3/uL (ref 0.0–0.5)
EOS%: 5.9 % (ref 0.0–7.0)
HCT: 41.7 % (ref 34.8–46.6)
HGB: 13.3 g/dL (ref 11.6–15.9)
IMMATURE RETIC FRACT: 3.1 % (ref 1.60–10.00)
LYMPH#: 0.9 10*3/uL (ref 0.9–3.3)
LYMPH%: 13.8 % — ABNORMAL LOW (ref 14.0–49.7)
MCH: 31.1 pg (ref 25.1–34.0)
MCHC: 31.9 g/dL (ref 31.5–36.0)
MCV: 97.4 fL (ref 79.5–101.0)
MONO#: 0.9 10*3/uL (ref 0.1–0.9)
MONO%: 12.9 % (ref 0.0–14.0)
NEUT%: 66.8 % (ref 38.4–76.8)
NEUTROS ABS: 4.5 10*3/uL (ref 1.5–6.5)
PLATELETS: 170 10*3/uL (ref 145–400)
RBC: 4.28 10*6/uL (ref 3.70–5.45)
RDW: 14.2 % (ref 11.2–14.5)
RETIC CT ABS: 59.06 10*3/uL (ref 33.70–90.70)
Retic %: 1.38 % (ref 0.70–2.10)
WBC: 6.8 10*3/uL (ref 3.9–10.3)

## 2016-05-07 LAB — COMPREHENSIVE METABOLIC PANEL
ALBUMIN: 3.7 g/dL (ref 3.5–5.0)
ALK PHOS: 68 U/L (ref 40–150)
ALT: 17 U/L (ref 0–55)
AST: 17 U/L (ref 5–34)
Anion Gap: 9 mEq/L (ref 3–11)
BUN: 11.5 mg/dL (ref 7.0–26.0)
CO2: 25 mEq/L (ref 22–29)
Calcium: 9.4 mg/dL (ref 8.4–10.4)
Chloride: 107 mEq/L (ref 98–109)
Creatinine: 0.8 mg/dL (ref 0.6–1.1)
EGFR: 78 mL/min/{1.73_m2} — AB (ref >90)
GLUCOSE: 103 mg/dL (ref 70–140)
Potassium: 4 mEq/L (ref 3.5–5.1)
SODIUM: 140 meq/L (ref 136–145)
Total Bilirubin: 0.42 mg/dL (ref 0.20–1.20)
Total Protein: 7.2 g/dL (ref 6.4–8.3)

## 2016-05-07 LAB — LACTATE DEHYDROGENASE: LDH: 224 U/L (ref 125–245)

## 2016-05-07 MED ORDER — ACETAMINOPHEN 325 MG PO TABS
ORAL_TABLET | ORAL | Status: AC
  Filled 2016-05-07: qty 2

## 2016-05-07 MED ORDER — DIPHENHYDRAMINE HCL 25 MG PO CAPS
50.0000 mg | ORAL_CAPSULE | Freq: Once | ORAL | Status: AC
  Administered 2016-05-07: 50 mg via ORAL

## 2016-05-07 MED ORDER — PALONOSETRON HCL INJECTION 0.25 MG/5ML
INTRAVENOUS | Status: AC
  Filled 2016-05-07: qty 5

## 2016-05-07 MED ORDER — SODIUM CHLORIDE 0.9 % IV SOLN
70.0000 mg/m2 | Freq: Once | INTRAVENOUS | Status: AC
  Administered 2016-05-07: 150 mg via INTRAVENOUS
  Filled 2016-05-07: qty 6

## 2016-05-07 MED ORDER — ACETAMINOPHEN 325 MG PO TABS
650.0000 mg | ORAL_TABLET | Freq: Once | ORAL | Status: AC
  Administered 2016-05-07: 650 mg via ORAL

## 2016-05-07 MED ORDER — SODIUM CHLORIDE 0.9 % IV SOLN
375.0000 mg/m2 | Freq: Once | INTRAVENOUS | Status: AC
  Administered 2016-05-07: 800 mg via INTRAVENOUS
  Filled 2016-05-07: qty 50

## 2016-05-07 MED ORDER — DEXAMETHASONE SODIUM PHOSPHATE 10 MG/ML IJ SOLN
INTRAMUSCULAR | Status: AC
  Filled 2016-05-07: qty 1

## 2016-05-07 MED ORDER — PALONOSETRON HCL INJECTION 0.25 MG/5ML
0.2500 mg | Freq: Once | INTRAVENOUS | Status: AC
  Administered 2016-05-07: 0.25 mg via INTRAVENOUS

## 2016-05-07 MED ORDER — SODIUM CHLORIDE 0.9 % IV SOLN
Freq: Once | INTRAVENOUS | Status: AC
  Administered 2016-05-07: 11:00:00 via INTRAVENOUS

## 2016-05-07 MED ORDER — DEXAMETHASONE SODIUM PHOSPHATE 10 MG/ML IJ SOLN
10.0000 mg | Freq: Once | INTRAMUSCULAR | Status: AC
  Administered 2016-05-07: 10 mg via INTRAVENOUS

## 2016-05-07 MED ORDER — DIPHENHYDRAMINE HCL 25 MG PO CAPS
ORAL_CAPSULE | ORAL | Status: AC
  Filled 2016-05-07: qty 2

## 2016-05-07 NOTE — Patient Instructions (Signed)
[place] Discharge Instructions for Patients Receiving Chemotherapy  Today you received the following chemotherapy agents: Rituxan and Bendeka   To help prevent nausea and vomiting after your treatment, we encourage you to take your nausea medication as directed.   If you develop nausea and vomiting that is not controlled by your nausea medication, call the clinic.   BELOW ARE SYMPTOMS THAT SHOULD BE REPORTED IMMEDIATELY:  *FEVER GREATER THAN 100.5 F  *CHILLS WITH OR WITHOUT FEVER  NAUSEA AND VOMITING THAT IS NOT CONTROLLED WITH YOUR NAUSEA MEDICATION  *UNUSUAL SHORTNESS OF BREATH  *UNUSUAL BRUISING OR BLEEDING  TENDERNESS IN MOUTH AND THROAT WITH OR WITHOUT PRESENCE OF ULCERS  *URINARY PROBLEMS  *BOWEL PROBLEMS  UNUSUAL RASH Items with * indicate a potential emergency and should be followed up as soon as possible.  Feel free to call the clinic you have any questions or concerns. The clinic phone number is [phone].  Please show the [place] at check-in to the Emergency Department and triage nurse.   Rituximab injection What is this medicine? RITUXIMAB (ri TUX i mab) is a monoclonal antibody. It is used to treat non-Hodgkin lymphoma and chronic lymphocytic leukemia. It is also used to treat rheumatoid arthritis (RA). In RA, this medicine slows the inflammatory process and help reduce joint pain and swelling. This medicine is often used with other cancer or arthritis medications. This medicine may be used for other purposes; ask your health care provider or pharmacist if you have questions. COMMON BRAND NAME(S): Rituxan What should I tell my health care provider before I take this medicine? They need to know if you have any of these conditions: -heart disease -infection (especially a virus infection such as hepatitis B, chickenpox, cold sores, or herpes) -immune system problems -irregular heartbeat -kidney disease -lung or breathing  disease, like asthma -recently received or scheduled to receive a vaccine -an unusual or allergic reaction to rituximab, mouse proteins, other medicines, foods, dyes, or preservatives -pregnant or trying to get pregnant -breast-feeding How should I use this medicine? This medicine is for infusion into a vein. It is administered in a hospital or clinic by a specially trained health care professional. A special MedGuide will be given to you by the pharmacist with each prescription and refill. Be sure to read this information carefully each time. Talk to your pediatrician regarding the use of this medicine in children. This medicine is not approved for use in children. Overdosage: If you think you have taken too much of this medicine contact a poison control center or emergency room at once. NOTE: This medicine is only for you. Do not share this medicine with others. What if I miss a dose? It is important not to miss a dose. Call your doctor or health care professional if you are unable to keep an appointment. What may interact with this medicine? -cisplatin -medicines for blood pressure -some other medicines for arthritis -vaccines This list may not describe all possible interactions. Give your health care provider a list of all the medicines, herbs, non-prescription drugs, or dietary supplements you use. Also tell them if you smoke, drink alcohol, or use illegal drugs. Some items may interact with your medicine. What should I watch for while using this medicine? Report any side effects that you notice during your treatment right away, such as changes in your breathing, fever, chills, dizziness or lightheadedness. These effects are more common with the first dose. Visit your prescriber or health care professional for  checks on your progress. You will need to have regular blood work. Report any other side effects. The side effects of this medicine can continue after you finish your treatment.  Continue your course of treatment even though you feel ill unless your doctor tells you to stop. Call your doctor or health care professional for advice if you get a fever, chills or sore throat, or other symptoms of a cold or flu. Do not treat yourself. This drug decreases your body's ability to fight infections. Try to avoid being around people who are sick. This medicine may increase your risk to bruise or bleed. Call your doctor or health care professional if you notice any unusual bleeding. Be careful brushing and flossing your teeth or using a toothpick because you may get an infection or bleed more easily. If you have any dental work done, tell your dentist you are receiving this medicine. Avoid taking products that contain aspirin, acetaminophen, ibuprofen, naproxen, or ketoprofen unless instructed by your doctor. These medicines may hide a fever. Do not become pregnant while taking this medicine. Women should inform their doctor if they wish to become pregnant or think they might be pregnant. There is a potential for serious side effects to an unborn child. Talk to your health care professional or pharmacist for more information. Do not breast-feed an infant while taking this medicine. What side effects may I notice from receiving this medicine? Side effects that you should report to your doctor or health care professional as soon as possible: -allergic reactions like skin rash, itching or hives, swelling of the face, lips, or tongue -low blood counts - this medicine may decrease the number of white blood cells, red blood cells and platelets. You may be at increased risk for infections and bleeding. -signs of infection - fever or chills, cough, sore throat, pain or difficulty passing urine -signs of decreased platelets or bleeding - bruising, pinpoint red spots on the skin, black, tarry stools, blood in the urine -signs of decreased red blood cells - unusually weak or tired, fainting spells,  lightheadedness -breathing problems -confused, not responsive -chest pain -fast, irregular heartbeat -feeling faint or lightheaded, falls -mouth sores -redness, blistering, peeling or loosening of the skin, including inside the mouth -stomach pain -swelling of the ankles, feet, or hands -trouble passing urine or change in the amount of urine Side effects that usually do not require medical attention (report to your doctor or health care professional if they continue or are bothersome): -anxiety -headache -loss of appetite -muscle aches -nausea -night sweats This list may not describe all possible side effects. Call your doctor for medical advice about side effects. You may report side effects to FDA at 1-800-FDA-1088. Where should I keep my medicine? This drug is given in a hospital or clinic and will not be stored at home. NOTE: This sheet is a summary. It may not cover all possible information. If you have questions about this medicine, talk to your doctor, pharmacist, or health care provider.  2017 Elsevier/Gold Standard (2015-09-15 17:23:26)   Bendamustine Injection What is this medicine? BENDAMUSTINE (BEN da MUS teen) is a chemotherapy drug. It is used to treat chronic lymphocytic leukemia and non-Hodgkin lymphoma. COMMON BRAND NAME(S): BENDEKA, Treanda What should I tell my health care provider before I take this medicine? They need to know if you have any of these conditions: -infection (especially a virus infection such as chickenpox, cold sores, or herpes) -kidney disease -liver disease -an unusual or allergic reaction to  bendamustine, mannitol, other medicines, foods, dyes, or preservatives -pregnant or trying to get pregnant -breast-feeding How should I use this medicine? This medicine is for infusion into a vein. It is given by a health care professional in a hospital or clinic setting. Talk to your pediatrician regarding the use of this medicine in children.  Special care may be needed. What if I miss a dose? It is important not to miss your dose. Call your doctor or health care professional if you are unable to keep an appointment. What may interact with this medicine? Do not take this medicine with any of the following medications: -clozapine This medicine may also interact with the following medications: -atazanavir -cimetidine -ciprofloxacin -enoxacin -fluvoxamine -medicines for seizures like carbamazepine and phenobarbital -mexiletine -rifampin -tacrine -thiabendazole -zileuton What should I watch for while using this medicine? This drug may make you feel generally unwell. This is not uncommon, as chemotherapy can affect healthy cells as well as cancer cells. Report any side effects. Continue your course of treatment even though you feel ill unless your doctor tells you to stop. You may need blood work done while you are taking this medicine. Call your doctor or health care professional for advice if you get a fever, chills or sore throat, or other symptoms of a cold or flu. Do not treat yourself. This drug decreases your body's ability to fight infections. Try to avoid being around people who are sick. This medicine may increase your risk to bruise or bleed. Call your doctor or health care professional if you notice any unusual bleeding. Talk to your doctor about your risk of cancer. You may be more at risk for certain types of cancers if you take this medicine. Do not become pregnant while taking this medicine or for 3 months after stopping it. Women should inform their doctor if they wish to become pregnant or think they might be pregnant. Men should not father a child while taking this medicine and for 3 months after stopping it.There is a potential for serious side effects to an unborn child. Talk to your health care professional or pharmacist for more information. Do not breast-feed an infant while taking this medicine. This medicine  may interfere with the ability to have a child. You should talk with your doctor or health care professional if you are concerned about your fertility. What side effects may I notice from receiving this medicine? Side effects that you should report to your doctor or health care professional as soon as possible: -allergic reactions like skin rash, itching or hives, swelling of the face, lips, or tongue -low blood counts - this medicine may decrease the number of white blood cells, red blood cells and platelets. You may be at increased risk for infections and bleeding. -redness, blistering, peeling or loosening of the skin, including inside the mouth -signs of infection - fever or chills, cough, sore throat, pain or difficulty passing urine -signs of decreased platelets or bleeding - bruising, pinpoint red spots on the skin, black, tarry stools, blood in the urine -signs of decreased red blood cells - unusually weak or tired, fainting spells, lightheadedness -signs and symptoms of kidney injury like trouble passing urine or change in the amount of urine -signs and symptoms of liver injury like dark yellow or brown urine; general ill feeling or flu-like symptoms; light-colored stools; loss of appetite; nausea; right upper belly pain; unusually weak or tired; yellowing of the eyes or skin Side effects that usually do not require medical  attention (report to your doctor or health care professional if they continue or are bothersome): -constipation -decreased appetite -diarrhea -headache -mouth sores -nausea/vomiting -tiredness Where should I keep my medicine? This drug is given in a hospital or clinic and will not be stored at home.  2017 Elsevier/Gold Standard (2015-01-06 08:45:41)

## 2016-05-07 NOTE — Telephone Encounter (Signed)
Appointments scheduled per 2/19 LOS. Follow up appointment to be scheduled on 3/15 per Irene Limbo (verbal).Patient given AVS report and calendars with future scheduled appointments.

## 2016-05-07 NOTE — Telephone Encounter (Signed)
Per 2/19 LOS and staff message I have scheduled appts. Notified the scheduler 

## 2016-05-07 NOTE — Progress Notes (Signed)
HEMATOLOGY/ONCOLOGY CLINIC NOTE  Date of Service: .05/07/2016  Patient Care Team: Charolette Forward, MD as PCP - General (Cardiology) Physical medicine and rehabilitation: Sharlet Salina DO  CHIEF COMPLAINTS/PURPOSE OF CONSULTATION:  Newly diagnosed lymphoma  HISTORY OF PRESENTING ILLNESS:   Samantha [name] is a wonderful 76 y.o. female who has been referred to [place] by Dr .Charolette Forward, MD for evaluation and management of newly diagnosed lymphoma.   Patient has multiple medical comorbidities including history of hypertension, diabetes, dyslipidemia, coronary disease status post PCI in 2001, paroxysmal atrial fibrillation on anticoagulation and chronic low back pain due to degenerative degenerative disc disease with lumbar radiculopathy.  Patient notes increasing lower back pain for which she had an MRI of the lumbar spine without contrast on 10/31/2015 which showed retroperitoneal lymphadenopathy in addition to her degenerative disease. A CT of the abdomen and pelvis was done with contrast on 11/17/2015 and showed retroperitoneal lymphadenopathy with multiple nodes directly adjacent to the aorta and inferior vena cava. The adenopathy extends along the right common iliac chain as well as noted a right inguinal lymph node 12.5 mm in short axis.  Patient previously had a CT of the chest without contrast on 09/19/2015 to evaluate left lower chest wall pain which showed no evidence of lymphadenopathy or other acute cardiopulmonary disease.  Patient notes some chills at night and night sweats. Hasn't lost about 20 pounds of weight in the last 6 months down from 234 pounds to about 215 pounds. No overt fevers. Some increase in fatigue. Has been drinking ensure.  Patient subsequently had an ultrasound-guided biopsy of a right inguinal lymph node that shows atypical lymphoid proliferation and clonal B-cell population with kappa light chain excess. Overall picture is suggestive of follicular lymphoma  but was not definitive. The pathologist recommended excisional lymph node biopsy for definitive diagnosis.  Is accompanied by her friend and another family member for this clinic visit.  He had several questions which were answered in details.  INTERVAL HISTORY  Patient is here for f/u prior to her cycle 2 of BR. No overt new toxicities. Notes some anxiety controlled. Some arthralgias. No fevers/chills. Rt inguinal seroma/cellulitis -completely resolved. Still smoking heavily despite recommendations. Weight is stable.   MEDICAL HISTORY:  Past Medical History:  Diagnosis Date  . A-fib ([place])   . Arthritis   . Chronic back pain   . COPD (chronic obstructive pulmonary disease) ([place])   . Coronary artery disease   . Glaucoma   . Hearing aid worn   . High cholesterol   . Hypertension   . Lymphadenopathy 01/25/2016  . Myocardial infarction    2000  . Non Hodgkin's lymphoma ([place])   . Wears dentures    " Top plate"  . Wears glasses   #1 bilateral cataracts #2 COPD  #3 active smoker half a pack per day for about 60 years #4 degenerative arthritis - has had chronic back pain issues with multiple epidural steroid injections from 2014 [5 times] #5 glaucoma #6 degenerative arthritis #7 history of TIA #8 dyslipidemia #9 hypertension #10 coronary artery disease with inferior wall MI in the past, status post PCI to RCA in 2001 #11 hypothyroidism #12 lumbar radiculopathy/neuropathy. #13 paroxysmal atrial fibrillation on anticoagulation with Eliquis. #14 type 2 diabetes #16 obesity  SURGICAL HISTORY:  #1 cardiac catheterization #2 cholecystectomy #3 colonoscopy #4 wisdom teeth extraction.  SOCIAL HISTORY: Currently single. Notes that she lost her female partner 9 months ago. Active smoker half pack per day  since her teens  FAMILY HISTORY: Patient reports no history of cancers or blood disorders in the family that she is aware of.  ALLERGIES:  is allergic to hydrocodone and  oxycodone.  MEDICATIONS:  Current Outpatient Prescriptions  Medication Sig Dispense Refill  . acyclovir (ZOVIRAX) 400 MG tablet Take 1 tablet (400 mg total) by mouth daily. 30 tablet 3  . ALPRAZolam (XANAX) 0.25 MG tablet Take 0.25 mg by mouth daily as needed for anxiety.     Marland Kitchen amiodarone (PACERONE) 200 MG tablet Take 100 mg by mouth every morning.    Marland Kitchen atorvastatin (LIPITOR) 40 MG tablet Take 40 mg by mouth every morning.     . Bimatoprost (LUMIGAN OP) Place 1 drop into both eyes at bedtime.    Marland Kitchen dexamethasone (DECADRON) 4 MG tablet Take 2 tablets (8 mg total) by mouth daily. Start the day after bendamustine chemotherapy for 2 days. Take with food. 30 tablet 1  . ELIQUIS 5 MG TABS tablet Take 5 mg by mouth 2 (two) times daily.    Marland Kitchen gabapentin (NEURONTIN) 300 MG capsule Take 300 mg by mouth 2 (two) times daily.    . Hypromellose (ARTIFICIAL TEARS OP) Place 1 drop into both eyes daily as needed. Dry eyes    . losartan (COZAAR) 50 MG tablet Take 50 mg by mouth every morning.    . Melatonin 5 MG TABS Take 5 mg by mouth at bedtime.    . meloxicam (MOBIC) 7.5 MG tablet Take 7.5 mg by mouth at bedtime.    . metoprolol succinate (TOPROL-XL) 50 MG 24 hr tablet Take 50 mg by mouth daily.    . nitroGLYCERIN (NITROSTAT) 0.4 MG SL tablet Place 0.4 mg under the tongue every 5 (five) minutes x 3 doses as needed for chest pain.     Marland Kitchen ondansetron (ZOFRAN) 8 MG tablet Take 1 tablet (8 mg total) by mouth 2 (two) times daily as needed for refractory nausea / vomiting. Start on day 2 after bendamustine chemo 30 tablet 1  . prochlorperazine (COMPAZINE) 10 MG tablet Take 1 tablet (10 mg total) by mouth every 6 (six) hours as needed for nausea or vomiting. 30 tablet 0  . sulfamethoxazole-trimethoprim (BACTRIM DS,SEPTRA DS) 800-160 MG tablet     . traMADol (ULTRAM) 50 MG tablet Take 1-2 tablets (50-100 mg total) by mouth every 6 (six) hours as needed. 30 tablet 1   No current facility-administered medications for  this visit.     REVIEW OF SYSTEMS:    10 Point review of Systems was done is negative except as noted above.  PHYSICAL EXAMINATION: ECOG PERFORMANCE STATUS: 1 - Symptomatic but completely ambulatory  . Vitals:   05/07/16 0926  BP: 128/76  Pulse: 60  Resp: 16  Temp: 97.5 F (36.4 C)   Filed Weights   05/07/16 0926  Weight: 212 lb 6.4 oz (96.3 kg)   .Body mass index is 30.48 kg/m.  GENERAL:alert, NAD but anxious due to her new diagnosis. SKIN: some indurated at the site of the rt inguinal LN biopsy with some serous drainage. EYES: normal, conjunctiva are pink and non-injected, sclera clear OROPHARYNX:no exudate, no erythema and lips, buccal mucosa, and tongue normal  NECK: supple, no JVD, thyroid normal size, non-tender, without nodularity LYMPH:  no palpable lymphadenopathy in the cervical, axillary. Noted to have bilateral palpable inguinal lymph nodes R>L LUNGS: clear to auscultation with normal respiratory effort HEART: regular rate & rhythm,  no murmurs and trace lower extremity edema ABDOMEN: abdomen  soft, non-tender, normoactive bowel sounds, no palpable hepatosplenomegaly  Musculoskeletal: no cyanosis of digits and no clubbing  PSYCH: alert & oriented x 3 with fluent speech NEURO: no focal motor/sensory deficits  LABORATORY DATA:  I have reviewed the data as listed  . CBC Latest Ref Rng & Units 05/07/2016 04/23/2016 04/09/2016  WBC 3.9 - 10.3 10e3/uL 6.8 9.8 9.5  Hemoglobin 11.6 - 15.9 g/dL 13.3 12.8 13.4  Hematocrit 34.8 - 46.6 % 41.7 40.0 41.9  Platelets 145 - 400 10e3/uL 170 173 177   . CBC    Component Value Date/Time   WBC 6.8 05/07/2016 0911   WBC 9.8 01/23/2016 1023   RBC 4.28 05/07/2016 0911   RBC 4.26 01/23/2016 1023   HGB 13.3 05/07/2016 0911   HCT 41.7 05/07/2016 0911   PLT 170 05/07/2016 0911   MCV 97.4 05/07/2016 0911   MCH 31.1 05/07/2016 0911   MCH 31.0 01/23/2016 1023   MCHC 31.9 05/07/2016 0911   MCHC 31.7 01/23/2016 1023   RDW 14.2  05/07/2016 0911   LYMPHSABS 0.9 05/07/2016 0911   MONOABS 0.9 05/07/2016 0911   EOSABS 0.4 05/07/2016 0911   BASOSABS 0.0 05/07/2016 0911    . CMP Latest Ref Rng & Units 05/07/2016 04/23/2016 04/09/2016  Glucose 70 - 140 mg/dl 103 101 76  BUN 7.0 - 26.0 mg/dL 11.5 13.8 13.1  Creatinine 0.6 - 1.1 mg/dL 0.8 0.9 0.8  Sodium 136 - 145 mEq/L 140 142 142  Potassium 3.5 - 5.1 mEq/L 4.0 4.5 4.2  Chloride 101 - 111 mmol/L - - -  CO2 22 - 29 mEq/L 25 29 26   Calcium 8.4 - 10.4 mg/dL 9.4 9.6 9.2  Total Protein 6.4 - 8.3 g/dL 7.2 6.6 7.2  Total Bilirubin 0.20 - 1.20 mg/dL 0.42 0.36 0.38  Alkaline Phos 40 - 150 U/L 68 57 62  AST 5 - 34 U/L 17 14 20   ALT 0 - 55 U/L 17 15 18    . Lab Results  Component Value Date   LDH 200 01/30/2016            RADIOGRAPHIC STUDIES: I have personally reviewed the radiological images as listed and agreed with the findings in the report. No results found. MRI Lspine  10/31/2015 :   IMPRESSION: 1. At L4-5 there is a moderate broad-based disc bulge. Mild bilateral facet arthropathy with ligamentum flavum infolding. Severe spinal stenosis. Severe bilateral foraminal stenosis. 2. At L3-4 there is a broad-based disc bulge eccentric towards the right. Mild bilateral facet arthropathy. Bilateral lateral recess stenosis and mild spinal stenosis. Mild right foraminal stenosis. 3. At L2-3 there is a mild broad-based disc bulge flattening the ventral thecal sac. Bilateral lateral recess stenosis. Bilateral mild facet arthropathy. Mild spinal stenosis. 4. At L5-S1 there is a left paracentral disc protrusion abutting the left intraspinal S1 nerve root. 5. Retroperitoneal lymphadenopathy of uncertain etiology. Differential considerations include an inflammatory or infectious etiology versus neoplastic etiology such as lymphoma. Recommend further evaluation with a CT of the abdomen/pelvis with intravenous contrast.   Electronically Signed   By: Kathreen Devoid   On: 10/31/2015 14:54  PET/CT 12/22/2015  IMPRESSION: 1. Hypermetabolic confluent lower retroperitoneal / bilateral common iliac, right external iliac and right inguinal lymphadenopathy, consistent with lymphoma. 2. Solitary focus of osseous hypermetabolism in the left upper sacrum consistent with osseous involvement by lymphoma. 3. No metabolically active lymphoma in the neck or chest. Spleen is normal in size and metabolism. 4. Additional findings include aortic atherosclerosis,  three-vessel coronary atherosclerosis and mild sigmoid diverticulosis.   Electronically Signed   By: Ilona Sorrel M.D.   On: 12/22/2015 17:21   ASSESSMENT & PLAN:   76 year old Caucasian female with multiple chronic medical comorbidities as noted above with   #1 Stage IVB Low grade Follicular Lymphoma with extensive retroperitoneal lymphadenopathy and right inguinal lymphadenopathy as well as osseous involvement of the sacrum. LDH level within normal limits.  S/p 1st cycle of BR (bendamustine 70mg /m2) #2 Rt inguinal LN biopsy site infection and seroma -now resolved Plan - No prohibitive toxicities and labs stable. -will proceed with C2 of BR (will hold off on dose escalation of Bendamustine based on patietns co-morbidities and overall PS) -continue current supportive medications -infection prevention strategies. -counseled on smoking cessation -patient notes that "nothing will get me to quit smoking" -optimize po intake -continue BR as per schedule -RTC with Dr Irene Limbo on C3D1 with labs in 4 weeks  All of the patients , her friends and family members questions were answered to their apparent satisfaction. The patient knows to call the clinic with any problems, questions or concerns.  I spent 20 minutes counseling the patient face to face. The total time spent in the appointment was 20 minutes and more than 50% was on counseling and direct patient cares.    Sullivan Lone MD [place] AAHIVMS [facility]  [facility] Hematology/Oncology Physician [facility]  (Office):       [phone] (Work cell):  [phone] (Fax):           [phone]

## 2016-05-08 ENCOUNTER — Ambulatory Visit (HOSPITAL_BASED_OUTPATIENT_CLINIC_OR_DEPARTMENT_OTHER): Payer: Medicare Other

## 2016-05-08 VITALS — BP 117/40 | HR 61 | Temp 98.6°F | Resp 16

## 2016-05-08 DIAGNOSIS — Z5111 Encounter for antineoplastic chemotherapy: Secondary | ICD-10-CM | POA: Diagnosis not present

## 2016-05-08 DIAGNOSIS — C8298 Follicular lymphoma, unspecified, lymph nodes of multiple sites: Secondary | ICD-10-CM

## 2016-05-08 MED ORDER — SODIUM CHLORIDE 0.9 % IV SOLN
Freq: Once | INTRAVENOUS | Status: AC
  Administered 2016-05-08: 10:00:00 via INTRAVENOUS

## 2016-05-08 MED ORDER — SODIUM CHLORIDE 0.9 % IV SOLN
70.0000 mg/m2 | Freq: Once | INTRAVENOUS | Status: AC
  Administered 2016-05-08: 150 mg via INTRAVENOUS
  Filled 2016-05-08: qty 20

## 2016-05-08 MED ORDER — DEXAMETHASONE SODIUM PHOSPHATE 10 MG/ML IJ SOLN
INTRAMUSCULAR | Status: AC
  Filled 2016-05-08: qty 1

## 2016-05-08 MED ORDER — DEXAMETHASONE SODIUM PHOSPHATE 10 MG/ML IJ SOLN
10.0000 mg | Freq: Once | INTRAMUSCULAR | Status: AC
  Administered 2016-05-08: 10 mg via INTRAVENOUS

## 2016-05-08 NOTE — Patient Instructions (Addendum)
[facility] Discharge Instructions for Patients Receiving Chemotherapy  Today you received the following chemotherapy agents:  Treanda.  To help prevent nausea and vomiting after your treatment, we encourage you to take your nausea medication as directed.   If you develop nausea and vomiting that is not controlled by your nausea medication, call the clinic.   BELOW ARE SYMPTOMS THAT SHOULD BE REPORTED IMMEDIATELY:  *FEVER GREATER THAN 100.5 F  *CHILLS WITH OR WITHOUT FEVER  NAUSEA AND VOMITING THAT IS NOT CONTROLLED WITH YOUR NAUSEA MEDICATION  *UNUSUAL SHORTNESS OF BREATH  *UNUSUAL BRUISING OR BLEEDING  TENDERNESS IN MOUTH AND THROAT WITH OR WITHOUT PRESENCE OF ULCERS  *URINARY PROBLEMS  *BOWEL PROBLEMS  UNUSUAL RASH Items with * indicate a potential emergency and should be followed up as soon as possible.  Feel free to call the clinic you have any questions or concerns. The clinic phone number is [phone].  Please show the CHEMO ALERT CARD at check-in to the Emergency Department and triage nurse.   

## 2016-05-31 ENCOUNTER — Encounter: Payer: Self-pay | Admitting: Hematology

## 2016-05-31 ENCOUNTER — Ambulatory Visit (HOSPITAL_BASED_OUTPATIENT_CLINIC_OR_DEPARTMENT_OTHER): Payer: Medicare Other | Admitting: Hematology

## 2016-05-31 DIAGNOSIS — C8298 Follicular lymphoma, unspecified, lymph nodes of multiple sites: Secondary | ICD-10-CM | POA: Diagnosis not present

## 2016-05-31 DIAGNOSIS — Z720 Tobacco use: Secondary | ICD-10-CM | POA: Diagnosis not present

## 2016-05-31 DIAGNOSIS — J0100 Acute maxillary sinusitis, unspecified: Secondary | ICD-10-CM

## 2016-05-31 DIAGNOSIS — N939 Abnormal uterine and vaginal bleeding, unspecified: Secondary | ICD-10-CM

## 2016-05-31 MED ORDER — DOXYCYCLINE HYCLATE 100 MG PO TABS: 100.0000 mg | ORAL_TABLET | Freq: Two times a day (BID) | ORAL | 0 refills | Status: DC

## 2016-06-03 NOTE — Progress Notes (Signed)
HEMATOLOGY/ONCOLOGY CLINIC NOTE  Date of Service: .05/31/2016  Patient Care Team: Charolette Forward, MD as PCP - General (Cardiology) Physical medicine and rehabilitation: Sharlet Salina DO  CHIEF COMPLAINTS/PURPOSE OF CONSULTATION:  Follow-up for follicular lymphoma  INTERVAL HISTORY  Patient is here for f/u prior to her cycle 3 of BR. No overt new toxicities. Notes that she had some vaginal bleeding after her previous cycle which resolved rapidly. Notes no perineal ulcers. Recommended GYN evaluation which she currently refuses since her symptoms are resolved. Patient notes that she has had a persistent cold for a couple weeks. She called her primary care physician Dr. Terrence Dupont and was given Augmentin which she has completed for 7 days. Her symptoms suggest primarily postnasal drip. No fevers no chills no colored discharge no sore throat. She was given a prescription for doxycycline to hold onto in case her symptoms worsened after chemotherapy. No change in breathing. She continues to smoke significantly and is not interested in cutting down. She clearly understands that her smoking puts her at significant risk for respiratory infections.  MEDICAL HISTORY:  Past Medical History:  Diagnosis Date  . A-fib ([place])   . Arthritis   . Chronic back pain   . COPD (chronic obstructive pulmonary disease) ([place])   . Coronary artery disease   . Glaucoma   . Hearing aid worn   . High cholesterol   . Hypertension   . Lymphadenopathy 01/25/2016  . Myocardial infarction    2000  . Non Hodgkin's lymphoma ([place])   . Wears dentures    " Top plate"  . Wears glasses   #1 bilateral cataracts #2 COPD  #3 active smoker half a pack per day for about 60 years #4 degenerative arthritis - has had chronic back pain issues with multiple epidural steroid injections from 2014 [5 times] #5 glaucoma #6 degenerative arthritis #7 history of TIA #8 dyslipidemia #9 hypertension #10 coronary artery disease  with inferior wall MI in the past, status post PCI to RCA in 2001 #11 hypothyroidism #12 lumbar radiculopathy/neuropathy. #13 paroxysmal atrial fibrillation on anticoagulation with Eliquis. #14 type 2 diabetes #16 obesity  SURGICAL HISTORY:  #1 cardiac catheterization #2 cholecystectomy #3 colonoscopy #4 wisdom teeth extraction.  SOCIAL HISTORY: Currently single. Notes that she lost her female partner 9 months ago. Active smoker half pack per day since her teens  FAMILY HISTORY: Patient reports no history of cancers or blood disorders in the family that she is aware of.  ALLERGIES:  is allergic to hydrocodone and oxycodone.  MEDICATIONS:  Current Outpatient Prescriptions  Medication Sig Dispense Refill  . acyclovir (ZOVIRAX) 400 MG tablet Take 1 tablet (400 mg total) by mouth daily. 30 tablet 3  . ALPRAZolam (XANAX) 0.25 MG tablet Take 0.25 mg by mouth daily as needed for anxiety.     Marland Kitchen amiodarone (PACERONE) 200 MG tablet Take 100 mg by mouth every morning.    Marland Kitchen atorvastatin (LIPITOR) 40 MG tablet Take 40 mg by mouth every morning.     . Bimatoprost (LUMIGAN OP) Place 1 drop into both eyes at bedtime.    Marland Kitchen dexamethasone (DECADRON) 4 MG tablet Take 2 tablets (8 mg total) by mouth daily. Start the day after bendamustine chemotherapy for 2 days. Take with food. 30 tablet 1  . ELIQUIS 5 MG TABS tablet Take 5 mg by mouth 2 (two) times daily.    Marland Kitchen gabapentin (NEURONTIN) 300 MG capsule Take 300 mg by mouth 2 (two) times daily.    Marland Kitchen  Hypromellose (ARTIFICIAL TEARS OP) Place 1 drop into both eyes daily as needed. Dry eyes    . losartan (COZAAR) 50 MG tablet Take 50 mg by mouth every morning.    . Melatonin 5 MG TABS Take 5 mg by mouth at bedtime.    . meloxicam (MOBIC) 7.5 MG tablet Take 7.5 mg by mouth at bedtime.    . metoprolol succinate (TOPROL-XL) 50 MG 24 hr tablet Take 50 mg by mouth daily.    . nitroGLYCERIN (NITROSTAT) 0.4 MG SL tablet Place 0.4 mg under the tongue every 5  (five) minutes x 3 doses as needed for chest pain.     Marland Kitchen ondansetron (ZOFRAN) 8 MG tablet Take 1 tablet (8 mg total) by mouth 2 (two) times daily as needed for refractory nausea / vomiting. Start on day 2 after bendamustine chemo 30 tablet 1  . prochlorperazine (COMPAZINE) 10 MG tablet Take 1 tablet (10 mg total) by mouth every 6 (six) hours as needed for nausea or vomiting. 30 tablet 0  . sulfamethoxazole-trimethoprim (BACTRIM DS,SEPTRA DS) 800-160 MG tablet     . traMADol (ULTRAM) 50 MG tablet Take 1-2 tablets (50-100 mg total) by mouth every 6 (six) hours as needed. 30 tablet 1  . doxycycline (VIBRA-TABS) 100 MG tablet Take 1 tablet (100 mg total) by mouth 2 (two) times daily. 14 tablet 0   No current facility-administered medications for this visit.     REVIEW OF SYSTEMS:    10 Point review of Systems was done is negative except as noted above.  PHYSICAL EXAMINATION: ECOG PERFORMANCE STATUS: 1 - Symptomatic but completely ambulatory Vital signs recorded in the EMR, reviewed.  GENERAL:alert, NAD but anxious due to her new diagnosis. SKIN: some indurated at the site of the rt inguinal LN biopsy with some serous drainage. EYES: normal, conjunctiva are pink and non-injected, sclera clear OROPHARYNX:no exudate, no erythema and lips, buccal mucosa, and tongue normal  NECK: supple, no JVD, thyroid normal size, non-tender, without nodularity LYMPH:  no palpable lymphadenopathy in the cervical, axillary or inguinal area LUNGS: clear to auscultation with normal respiratory effort HEART: regular rate & rhythm,  no murmurs and trace lower extremity edema ABDOMEN: abdomen soft, non-tender, normoactive bowel sounds, no palpable hepatosplenomegaly  Musculoskeletal: no cyanosis of digits and no clubbing  PSYCH: alert & oriented x 3 with fluent speech NEURO: no focal motor/sensory deficits  LABORATORY DATA:  I have reviewed the data as listed  . CBC Latest Ref Rng & Units 05/07/2016 04/23/2016  04/09/2016  WBC 3.9 - 10.3 10e3/uL 6.8 9.8 9.5  Hemoglobin 11.6 - 15.9 g/dL 13.3 12.8 13.4  Hematocrit 34.8 - 46.6 % 41.7 40.0 41.9  Platelets 145 - 400 10e3/uL 170 173 177   . CBC    Component Value Date/Time   WBC 6.8 05/07/2016 0911   WBC 9.8 01/23/2016 1023   RBC 4.28 05/07/2016 0911   RBC 4.26 01/23/2016 1023   HGB 13.3 05/07/2016 0911   HCT 41.7 05/07/2016 0911   PLT 170 05/07/2016 0911   MCV 97.4 05/07/2016 0911   MCH 31.1 05/07/2016 0911   MCH 31.0 01/23/2016 1023   MCHC 31.9 05/07/2016 0911   MCHC 31.7 01/23/2016 1023   RDW 14.2 05/07/2016 0911   LYMPHSABS 0.9 05/07/2016 0911   MONOABS 0.9 05/07/2016 0911   EOSABS 0.4 05/07/2016 0911   BASOSABS 0.0 05/07/2016 0911    . CMP Latest Ref Rng & Units 05/07/2016 04/23/2016 04/09/2016  Glucose 70 - 140 mg/dl 103  101 76  BUN 7.0 - 26.0 mg/dL 11.5 13.8 13.1  Creatinine 0.6 - 1.1 mg/dL 0.8 0.9 0.8  Sodium 136 - 145 mEq/L 140 142 142  Potassium 3.5 - 5.1 mEq/L 4.0 4.5 4.2  Chloride 101 - 111 mmol/L - - -  CO2 22 - 29 mEq/L 25 29 26   Calcium 8.4 - 10.4 mg/dL 9.4 9.6 9.2  Total Protein 6.4 - 8.3 g/dL 7.2 6.6 7.2  Total Bilirubin 0.20 - 1.20 mg/dL 0.42 0.36 0.38  Alkaline Phos 40 - 150 U/L 68 57 62  AST 5 - 34 U/L 17 14 20   ALT 0 - 55 U/L 17 15 18    . Lab Results  Component Value Date   LDH 224 05/07/2016            RADIOGRAPHIC STUDIES: I have personally reviewed the radiological images as listed and agreed with the findings in the report. No results found.  PET/CT 12/22/2015  IMPRESSION: 1. Hypermetabolic confluent lower retroperitoneal / bilateral common iliac, right external iliac and right inguinal lymphadenopathy, consistent with lymphoma. 2. Solitary focus of osseous hypermetabolism in the left upper sacrum consistent with osseous involvement by lymphoma. 3. No metabolically active lymphoma in the neck or chest. Spleen is normal in size and metabolism. 4. Additional findings include aortic  atherosclerosis, three-vessel coronary atherosclerosis and mild sigmoid diverticulosis.   Electronically Signed   By: Ilona Sorrel M.D.   On: 12/22/2015 17:21   ASSESSMENT & PLAN:   76 year old Caucasian female with multiple chronic medical comorbidities as noted above with   #1 Stage IVB Low grade Follicular Lymphoma with extensive retroperitoneal lymphadenopathy and right inguinal lymphadenopathy as well as osseous involvement of the sacrum. LDH level within normal limits.  S/p 2 cycles of BR (bendamustine 70mg /m2) #2 Rt inguinal LN biopsy site infection and seroma -now resolved Plan - No prohibitive toxicities and labs stable. -will proceed with C3 of BR (will hold off on dose escalation of Bendamustine based on patietns co-morbidities and overall PS) -continue current supportive medications -infection prevention strategies. -counseled on smoking cessation -patient notes that "nothing will get me to quit smoking" -optimize po intake -continue BR as per schedule -We shall plan to repeat a PET/CT scan after her third cycle of chemotherapy prior to the fourth cycle to evaluate response to treatment.  #3 upper respiratory tract infection resolving after treatment with Augmentin as per primary care physician . Currently no febrile symptoms and symptoms are mostly resolved . Plan  -Given a prescription for doxycycline so the patient might have this available if her symptoms worsened after chemotherapy .  #4 postmenopausal vaginal bleeding . Patient reports it occurred only one or 2 times and has resolved . -She declines offer for GYN referral . -Will need more specific evaluation and intervention if any abnormalities noted on PET/CT scan .  PET/CT in 3 weeks RTC with Dr Irene Limbo in 4 weeks with labs with C4D1 of chemotherapy.  All of the patients , her friends and family members questions were answered to their apparent satisfaction. The patient knows to call the clinic with any  problems, questions or concerns.  I spent 20 minutes counseling the patient face to face. The total time spent in the appointment was 25 minutes and more than 50% was on counseling and direct patient cares.    Sullivan Lone MD [place] AAHIVMS [facility] Hematology/Oncology Physician [facility]  (Office):       [phone] (Work cell):  [phone] (Fax):  [phone] 

## 2016-06-04 ENCOUNTER — Ambulatory Visit (HOSPITAL_BASED_OUTPATIENT_CLINIC_OR_DEPARTMENT_OTHER): Payer: Medicare Other

## 2016-06-04 ENCOUNTER — Other Ambulatory Visit (HOSPITAL_BASED_OUTPATIENT_CLINIC_OR_DEPARTMENT_OTHER): Payer: Medicare Other

## 2016-06-04 ENCOUNTER — Other Ambulatory Visit: Payer: Self-pay | Admitting: Internal Medicine

## 2016-06-04 VITALS — BP 143/50 | HR 65 | Temp 97.0°F | Resp 18

## 2016-06-04 DIAGNOSIS — Z5112 Encounter for antineoplastic immunotherapy: Secondary | ICD-10-CM | POA: Diagnosis not present

## 2016-06-04 DIAGNOSIS — Z5111 Encounter for antineoplastic chemotherapy: Secondary | ICD-10-CM | POA: Diagnosis not present

## 2016-06-04 DIAGNOSIS — C8298 Follicular lymphoma, unspecified, lymph nodes of multiple sites: Secondary | ICD-10-CM

## 2016-06-04 LAB — CBC & DIFF AND RETIC
BASO%: 0.9 % (ref 0.0–2.0)
Basophils Absolute: 0.1 10*3/uL (ref 0.0–0.1)
EOS ABS: 0.3 10*3/uL (ref 0.0–0.5)
EOS%: 5.4 % (ref 0.0–7.0)
HEMATOCRIT: 38.5 % (ref 34.8–46.6)
HEMOGLOBIN: 12.6 g/dL (ref 11.6–15.9)
IMMATURE RETIC FRACT: 3.4 % (ref 1.60–10.00)
LYMPH#: 1.5 10*3/uL (ref 0.9–3.3)
LYMPH%: 26.7 % (ref 14.0–49.7)
MCH: 31.5 pg (ref 25.1–34.0)
MCHC: 32.7 g/dL (ref 31.5–36.0)
MCV: 96.3 fL (ref 79.5–101.0)
MONO#: 0.6 10*3/uL (ref 0.1–0.9)
MONO%: 11 % (ref 0.0–14.0)
NEUT#: 3.2 10*3/uL (ref 1.5–6.5)
NEUT%: 56 % (ref 38.4–76.8)
Platelets: 177 10*3/uL (ref 145–400)
RBC: 4 10*6/uL (ref 3.70–5.45)
RDW: 13.6 % (ref 11.2–14.5)
RETIC %: 1.58 % (ref 0.70–2.10)
Retic Ct Abs: 63.2 10*3/uL (ref 33.70–90.70)
WBC: 5.7 10*3/uL (ref 3.9–10.3)

## 2016-06-04 LAB — COMPREHENSIVE METABOLIC PANEL
ALBUMIN: 3.3 g/dL — AB (ref 3.5–5.0)
ALK PHOS: 65 U/L (ref 40–150)
ALT: 15 U/L (ref 0–55)
AST: 18 U/L (ref 5–34)
Anion Gap: 8 mEq/L (ref 3–11)
BILIRUBIN TOTAL: 0.45 mg/dL (ref 0.20–1.20)
BUN: 10.9 mg/dL (ref 7.0–26.0)
CALCIUM: 9 mg/dL (ref 8.4–10.4)
CO2: 27 mEq/L (ref 22–29)
CREATININE: 0.8 mg/dL (ref 0.6–1.1)
Chloride: 107 mEq/L (ref 98–109)
EGFR: 74 mL/min/{1.73_m2} — ABNORMAL LOW (ref >90)
GLUCOSE: 101 mg/dL (ref 70–140)
Potassium: 3.9 mEq/L (ref 3.5–5.1)
SODIUM: 142 meq/L (ref 136–145)
TOTAL PROTEIN: 6.6 g/dL (ref 6.4–8.3)

## 2016-06-04 LAB — LACTATE DEHYDROGENASE: LDH: 248 U/L — AB (ref 125–245)

## 2016-06-04 MED ORDER — SODIUM CHLORIDE 0.9 % IV SOLN
Freq: Once | INTRAVENOUS | Status: AC
  Administered 2016-06-04: 10:00:00 via INTRAVENOUS

## 2016-06-04 MED ORDER — PALONOSETRON HCL INJECTION 0.25 MG/5ML
INTRAVENOUS | Status: AC
  Filled 2016-06-04: qty 5

## 2016-06-04 MED ORDER — SODIUM CHLORIDE 0.9 % IV SOLN
375.0000 mg/m2 | Freq: Once | INTRAVENOUS | Status: AC
  Administered 2016-06-04: 800 mg via INTRAVENOUS
  Filled 2016-06-04: qty 50

## 2016-06-04 MED ORDER — DIPHENHYDRAMINE HCL 25 MG PO CAPS
ORAL_CAPSULE | ORAL | Status: AC
  Filled 2016-06-04: qty 2

## 2016-06-04 MED ORDER — DIPHENHYDRAMINE HCL 25 MG PO CAPS
50.0000 mg | ORAL_CAPSULE | Freq: Once | ORAL | Status: AC
  Administered 2016-06-04: 50 mg via ORAL

## 2016-06-04 MED ORDER — PALONOSETRON HCL INJECTION 0.25 MG/5ML
0.2500 mg | Freq: Once | INTRAVENOUS | Status: AC
  Administered 2016-06-04: 0.25 mg via INTRAVENOUS

## 2016-06-04 MED ORDER — ACETAMINOPHEN 325 MG PO TABS
650.0000 mg | ORAL_TABLET | Freq: Once | ORAL | Status: AC
  Administered 2016-06-04: 650 mg via ORAL

## 2016-06-04 MED ORDER — DEXAMETHASONE SODIUM PHOSPHATE 10 MG/ML IJ SOLN
10.0000 mg | Freq: Once | INTRAMUSCULAR | Status: AC
  Administered 2016-06-04: 10 mg via INTRAVENOUS

## 2016-06-04 MED ORDER — HEPARIN SOD (PORK) LOCK FLUSH 100 UNIT/ML IV SOLN
500.0000 [IU] | Freq: Once | INTRAVENOUS | Status: DC | PRN
  Filled 2016-06-04: qty 5

## 2016-06-04 MED ORDER — DEXAMETHASONE SODIUM PHOSPHATE 10 MG/ML IJ SOLN
INTRAMUSCULAR | Status: AC
  Filled 2016-06-04: qty 1

## 2016-06-04 MED ORDER — SODIUM CHLORIDE 0.9 % IV SOLN
70.0000 mg/m2 | Freq: Once | INTRAVENOUS | Status: AC
  Administered 2016-06-04: 150 mg via INTRAVENOUS
  Filled 2016-06-04: qty 20

## 2016-06-04 MED ORDER — SODIUM CHLORIDE 0.9% FLUSH
10.0000 mL | INTRAVENOUS | Status: DC | PRN
  Filled 2016-06-04: qty 10

## 2016-06-04 MED ORDER — ACETAMINOPHEN 325 MG PO TABS
ORAL_TABLET | ORAL | Status: AC
  Filled 2016-06-04: qty 2

## 2016-06-04 NOTE — Patient Instructions (Signed)
[facility] Discharge Instructions for Patients Receiving Chemotherapy  Today you received the following chemotherapy agents: Rituxan and Bendeka  To help prevent nausea and vomiting after your treatment, we encourage you to take your nausea medication as directed.    If you develop nausea and vomiting that is not controlled by your nausea medication, call the clinic.   BELOW ARE SYMPTOMS THAT SHOULD BE REPORTED IMMEDIATELY:  *FEVER GREATER THAN 100.5 F  *CHILLS WITH OR WITHOUT FEVER  NAUSEA AND VOMITING THAT IS NOT CONTROLLED WITH YOUR NAUSEA MEDICATION  *UNUSUAL SHORTNESS OF BREATH  *UNUSUAL BRUISING OR BLEEDING  TENDERNESS IN MOUTH AND THROAT WITH OR WITHOUT PRESENCE OF ULCERS  *URINARY PROBLEMS  *BOWEL PROBLEMS  UNUSUAL RASH Items with * indicate a potential emergency and should be followed up as soon as possible.  Feel free to call the clinic you have any questions or concerns. The clinic phone number is [phone].  Please show the CHEMO ALERT CARD at check-in to the Emergency Department and triage nurse.   

## 2016-06-05 ENCOUNTER — Ambulatory Visit (HOSPITAL_BASED_OUTPATIENT_CLINIC_OR_DEPARTMENT_OTHER): Payer: Medicare Other

## 2016-06-05 DIAGNOSIS — Z5111 Encounter for antineoplastic chemotherapy: Secondary | ICD-10-CM | POA: Diagnosis not present

## 2016-06-05 DIAGNOSIS — C8298 Follicular lymphoma, unspecified, lymph nodes of multiple sites: Secondary | ICD-10-CM | POA: Diagnosis not present

## 2016-06-05 MED ORDER — DEXAMETHASONE SODIUM PHOSPHATE 10 MG/ML IJ SOLN
10.0000 mg | Freq: Once | INTRAMUSCULAR | Status: AC
  Administered 2016-06-05: 10 mg via INTRAVENOUS

## 2016-06-05 MED ORDER — BENDAMUSTINE HCL (LYOPHILIZED PWD) CHEMO INJECTION 100MG
70.0000 mg/m2 | Freq: Once | INTRAVENOUS | Status: AC
  Administered 2016-06-05: 150 mg via INTRAVENOUS
  Filled 2016-06-05: qty 20

## 2016-06-05 MED ORDER — DEXAMETHASONE SODIUM PHOSPHATE 10 MG/ML IJ SOLN
INTRAMUSCULAR | Status: AC
  Filled 2016-06-05: qty 1

## 2016-06-05 MED ORDER — SODIUM CHLORIDE 0.9 % IV SOLN
Freq: Once | INTRAVENOUS | Status: AC
  Administered 2016-06-05: 10:00:00 via INTRAVENOUS

## 2016-06-05 NOTE — Patient Instructions (Signed)
[place] Discharge Instructions for Patients Receiving Chemotherapy  Today you received the following chemotherapy agents Bendeka/Treanda   To help prevent nausea and vomiting after your treatment, we encourage you to take your nausea medication as directed.    If you develop nausea and vomiting that is not controlled by your nausea medication, call the clinic.   BELOW ARE SYMPTOMS THAT SHOULD BE REPORTED IMMEDIATELY:  *FEVER GREATER THAN 100.5 F  *CHILLS WITH OR WITHOUT FEVER  NAUSEA AND VOMITING THAT IS NOT CONTROLLED WITH YOUR NAUSEA MEDICATION  *UNUSUAL SHORTNESS OF BREATH  *UNUSUAL BRUISING OR BLEEDING  TENDERNESS IN MOUTH AND THROAT WITH OR WITHOUT PRESENCE OF ULCERS  *URINARY PROBLEMS  *BOWEL PROBLEMS  UNUSUAL RASH Items with * indicate a potential emergency and should be followed up as soon as possible.  Feel free to call the clinic you have any questions or concerns. The clinic phone number is [phone].  Please show the [facility] at check-in to the Emergency Department and triage nurse.

## 2016-06-25 ENCOUNTER — Other Ambulatory Visit: Payer: Self-pay | Admitting: *Deleted

## 2016-06-25 DIAGNOSIS — C8298 Follicular lymphoma, unspecified, lymph nodes of multiple sites: Secondary | ICD-10-CM

## 2016-06-25 MED ORDER — ACYCLOVIR 400 MG PO TABS: 400.0000 mg | ORAL_TABLET | Freq: Every day | ORAL | 3 refills | Status: DC

## 2016-06-28 ENCOUNTER — Encounter (HOSPITAL_COMMUNITY)
Admission: RE | Admit: 2016-06-28 | Discharge: 2016-06-28 | Disposition: A | Discharge status: Home / Self Care | Payer: Medicare Other | Source: Ambulatory Visit | Attending: Hematology | Admitting: Hematology

## 2016-06-28 DIAGNOSIS — C8298 Follicular lymphoma, unspecified, lymph nodes of multiple sites: Secondary | ICD-10-CM | POA: Diagnosis present

## 2016-06-28 LAB — GLUCOSE, CAPILLARY: Glucose-Capillary: 102 mg/dL — ABNORMAL HIGH (ref 65–99)

## 2016-06-28 MED ORDER — FLUDEOXYGLUCOSE F - 18 (FDG) INJECTION: 10.5000 | Freq: Once | INTRAVENOUS | Status: DC | PRN

## 2016-06-29 ENCOUNTER — Telehealth: Payer: Self-pay | Admitting: *Deleted

## 2016-06-29 NOTE — Telephone Encounter (Signed)
Per Dr. Irene Limbo, called pt to inform that PET scan showed favorable results with near complete resolution of lymphoma.  No answer, LVM for pt to return call, call back number provided.

## 2016-06-29 NOTE — Telephone Encounter (Signed)
Called patient and informed her of information below. Patient verbalized understanding.

## 2016-07-02 ENCOUNTER — Telehealth: Payer: Self-pay | Admitting: Hematology

## 2016-07-02 ENCOUNTER — Ambulatory Visit (HOSPITAL_BASED_OUTPATIENT_CLINIC_OR_DEPARTMENT_OTHER): Payer: Medicare Other | Admitting: Hematology

## 2016-07-02 ENCOUNTER — Other Ambulatory Visit (HOSPITAL_BASED_OUTPATIENT_CLINIC_OR_DEPARTMENT_OTHER): Payer: Medicare Other

## 2016-07-02 ENCOUNTER — Encounter: Payer: Self-pay | Admitting: Hematology

## 2016-07-02 ENCOUNTER — Ambulatory Visit (HOSPITAL_BASED_OUTPATIENT_CLINIC_OR_DEPARTMENT_OTHER): Payer: Medicare Other

## 2016-07-02 VITALS — BP 169/54 | HR 58 | Temp 96.8°F | Resp 18 | Wt 214.3 lb

## 2016-07-02 VITALS — BP 155/55 | HR 57 | Temp 98.0°F | Resp 16

## 2016-07-02 DIAGNOSIS — Z5112 Encounter for antineoplastic immunotherapy: Secondary | ICD-10-CM | POA: Diagnosis not present

## 2016-07-02 DIAGNOSIS — Z5111 Encounter for antineoplastic chemotherapy: Secondary | ICD-10-CM

## 2016-07-02 DIAGNOSIS — C8298 Follicular lymphoma, unspecified, lymph nodes of multiple sites: Secondary | ICD-10-CM

## 2016-07-02 LAB — CBC & DIFF AND RETIC
BASO%: 0.4 % (ref 0.0–2.0)
Basophils Absolute: 0 10*3/uL (ref 0.0–0.1)
EOS%: 2.4 % (ref 0.0–7.0)
Eosinophils Absolute: 0.2 10*3/uL (ref 0.0–0.5)
HEMATOCRIT: 37.4 % (ref 34.8–46.6)
HEMOGLOBIN: 12.5 g/dL (ref 11.6–15.9)
IMMATURE RETIC FRACT: 5.9 % (ref 1.60–10.00)
LYMPH#: 1.8 10*3/uL (ref 0.9–3.3)
LYMPH%: 28.9 % (ref 14.0–49.7)
MCH: 31.8 pg (ref 25.1–34.0)
MCHC: 33.6 g/dL (ref 31.5–36.0)
MCV: 94.8 fL (ref 79.5–101.0)
MONO#: 0.7 10*3/uL (ref 0.1–0.9)
MONO%: 11.2 % (ref 0.0–14.0)
NEUT#: 3.6 10*3/uL (ref 1.5–6.5)
NEUT%: 57.1 % (ref 38.4–76.8)
Platelets: 149 10*3/uL (ref 145–400)
RBC: 3.94 10*6/uL (ref 3.70–5.45)
RDW: 14 % (ref 11.2–14.5)
RETIC %: 1.76 % (ref 0.70–2.10)
RETIC CT ABS: 69.34 10*3/uL (ref 33.70–90.70)
WBC: 6.2 10*3/uL (ref 3.9–10.3)

## 2016-07-02 LAB — COMPREHENSIVE METABOLIC PANEL
ALBUMIN: 3.5 g/dL (ref 3.5–5.0)
ALK PHOS: 56 U/L (ref 40–150)
ALT: 12 U/L (ref 0–55)
AST: 20 U/L (ref 5–34)
Anion Gap: 10 mEq/L (ref 3–11)
BUN: 6.7 mg/dL — ABNORMAL LOW (ref 7.0–26.0)
CO2: 27 meq/L (ref 22–29)
Calcium: 9.2 mg/dL (ref 8.4–10.4)
Chloride: 105 mEq/L (ref 98–109)
Creatinine: 0.8 mg/dL (ref 0.6–1.1)
EGFR: 77 mL/min/{1.73_m2} — AB (ref >90)
GLUCOSE: 102 mg/dL (ref 70–140)
POTASSIUM: 3.7 meq/L (ref 3.5–5.1)
SODIUM: 142 meq/L (ref 136–145)
Total Bilirubin: 0.53 mg/dL (ref 0.20–1.20)
Total Protein: 6.7 g/dL (ref 6.4–8.3)

## 2016-07-02 MED ORDER — HEPARIN SOD (PORK) LOCK FLUSH 100 UNIT/ML IV SOLN
500.0000 [IU] | Freq: Once | INTRAVENOUS | Status: DC | PRN
  Filled 2016-07-02: qty 5

## 2016-07-02 MED ORDER — DEXAMETHASONE SODIUM PHOSPHATE 10 MG/ML IJ SOLN
INTRAMUSCULAR | Status: AC
  Filled 2016-07-02: qty 1

## 2016-07-02 MED ORDER — DEXAMETHASONE SODIUM PHOSPHATE 10 MG/ML IJ SOLN
10.0000 mg | Freq: Once | INTRAMUSCULAR | Status: AC
  Administered 2016-07-02: 10 mg via INTRAVENOUS

## 2016-07-02 MED ORDER — PALONOSETRON HCL INJECTION 0.25 MG/5ML
0.2500 mg | Freq: Once | INTRAVENOUS | Status: AC
  Administered 2016-07-02: 0.25 mg via INTRAVENOUS

## 2016-07-02 MED ORDER — ACETAMINOPHEN 325 MG PO TABS
ORAL_TABLET | ORAL | Status: AC
  Filled 2016-07-02: qty 2

## 2016-07-02 MED ORDER — SODIUM CHLORIDE 0.9 % IV SOLN
375.0000 mg/m2 | Freq: Once | INTRAVENOUS | Status: AC
  Administered 2016-07-02: 800 mg via INTRAVENOUS
  Filled 2016-07-02: qty 50

## 2016-07-02 MED ORDER — PALONOSETRON HCL INJECTION 0.25 MG/5ML
INTRAVENOUS | Status: AC
  Filled 2016-07-02: qty 5

## 2016-07-02 MED ORDER — DIPHENHYDRAMINE HCL 25 MG PO CAPS
ORAL_CAPSULE | ORAL | Status: AC
  Filled 2016-07-02: qty 2

## 2016-07-02 MED ORDER — SODIUM CHLORIDE 0.9 % IV SOLN
70.0000 mg/m2 | Freq: Once | INTRAVENOUS | Status: AC
  Administered 2016-07-02: 150 mg via INTRAVENOUS
  Filled 2016-07-02: qty 20

## 2016-07-02 MED ORDER — DIPHENHYDRAMINE HCL 25 MG PO CAPS
50.0000 mg | ORAL_CAPSULE | Freq: Once | ORAL | Status: AC
  Administered 2016-07-02: 50 mg via ORAL

## 2016-07-02 MED ORDER — ACETAMINOPHEN 325 MG PO TABS
650.0000 mg | ORAL_TABLET | Freq: Once | ORAL | Status: AC
  Administered 2016-07-02: 650 mg via ORAL

## 2016-07-02 MED ORDER — SODIUM CHLORIDE 0.9% FLUSH
10.0000 mL | INTRAVENOUS | Status: DC | PRN
  Filled 2016-07-02: qty 10

## 2016-07-02 MED ORDER — SODIUM CHLORIDE 0.9 % IV SOLN
Freq: Once | INTRAVENOUS | Status: AC
  Administered 2016-07-02: 11:00:00 via INTRAVENOUS

## 2016-07-02 NOTE — Progress Notes (Signed)
HEMATOLOGY/ONCOLOGY CLINIC NOTE  Date of Service: .07/02/2016  Patient Care Team: Charolette Forward, MD as PCP - General (Cardiology) Physical medicine and rehabilitation: Sharlet Salina DO  CHIEF COMPLAINTS/PURPOSE OF CONSULTATION:  Follow-up for follicular lymphoma  INTERVAL HISTORY  Patient is here for f/u prior to her cycle 4 of BR. No overt new toxicities. PET/CT scan done after 3 cycles of BR shows near complete metabolic response. Labs look stable today and she is okay with proceeding with her next cycle of treatment. She continues to smoke significantly and is not interested in cutting down. She clearly understands that her smoking puts her at significant risk for respiratory infections. No fevers or chills no night sweats no abdominal pain. We discussed completing a total of 5 cycles of treatment at least 2 cycles after CR. We also discussed the role for maintenance Rituxan and she is open to the possibility but wants to think about it.  MEDICAL HISTORY:  Past Medical History:  Diagnosis Date  . A-fib ([place])   . Arthritis   . Chronic back pain   . COPD (chronic obstructive pulmonary disease) ([place])   . Coronary artery disease   . Glaucoma   . Hearing aid worn   . High cholesterol   . Hypertension   . Lymphadenopathy 01/25/2016  . Myocardial infarction ([place])    2000  . Non Hodgkin's lymphoma ([place])   . Wears dentures    " Top plate"  . Wears glasses   #1 bilateral cataracts #2 COPD  #3 active smoker half a pack per day for about 60 years #4 degenerative arthritis - has had chronic back pain issues with multiple epidural steroid injections from 2014 [5 times] #5 glaucoma #6 degenerative arthritis #7 history of TIA #8 dyslipidemia #9 hypertension #10 coronary artery disease with inferior wall MI in the past, status post PCI to RCA in 2001 #11 hypothyroidism #12 lumbar radiculopathy/neuropathy. #13 paroxysmal atrial fibrillation on anticoagulation with  Eliquis. #14 type 2 diabetes #16 obesity  SURGICAL HISTORY:  #1 cardiac catheterization #2 cholecystectomy #3 colonoscopy #4 wisdom teeth extraction.  SOCIAL HISTORY: Currently single. Notes that she lost her female partner 9 months ago. Active smoker half pack per day since her teens  FAMILY HISTORY: Patient reports no history of cancers or blood disorders in the family that she is aware of.  ALLERGIES:  is allergic to hydrocodone and oxycodone.  MEDICATIONS:  Current Outpatient Prescriptions  Medication Sig Dispense Refill  . acyclovir (ZOVIRAX) 400 MG tablet Take 1 tablet (400 mg total) by mouth daily. 30 tablet 3  . ALPRAZolam (XANAX) 0.25 MG tablet Take 0.25 mg by mouth daily as needed for anxiety.     Marland Kitchen amiodarone (PACERONE) 200 MG tablet Take 100 mg by mouth every morning.    Marland Kitchen atorvastatin (LIPITOR) 40 MG tablet Take 40 mg by mouth every morning.     . Bimatoprost (LUMIGAN OP) Place 1 drop into both eyes at bedtime.    Marland Kitchen dexamethasone (DECADRON) 4 MG tablet Take 2 tablets (8 mg total) by mouth daily. Start the day after bendamustine chemotherapy for 2 days. Take with food. 30 tablet 1  . doxycycline (VIBRA-TABS) 100 MG tablet Take 1 tablet (100 mg total) by mouth 2 (two) times daily. 14 tablet 0  . ELIQUIS 5 MG TABS tablet Take 5 mg by mouth 2 (two) times daily.    Marland Kitchen gabapentin (NEURONTIN) 300 MG capsule Take 300 mg by mouth 2 (two) times daily.    Marland Kitchen  Hypromellose (ARTIFICIAL TEARS OP) Place 1 drop into both eyes daily as needed. Dry eyes    . losartan (COZAAR) 50 MG tablet Take 50 mg by mouth every morning.    . Melatonin 5 MG TABS Take 5 mg by mouth at bedtime.    . meloxicam (MOBIC) 7.5 MG tablet Take 7.5 mg by mouth at bedtime.    . metoprolol succinate (TOPROL-XL) 50 MG 24 hr tablet Take 50 mg by mouth daily.    . nitroGLYCERIN (NITROSTAT) 0.4 MG SL tablet Place 0.4 mg under the tongue every 5 (five) minutes x 3 doses as needed for chest pain.     Marland Kitchen ondansetron  (ZOFRAN) 8 MG tablet Take 1 tablet (8 mg total) by mouth 2 (two) times daily as needed for refractory nausea / vomiting. Start on day 2 after bendamustine chemo 30 tablet 1  . prochlorperazine (COMPAZINE) 10 MG tablet Take 1 tablet (10 mg total) by mouth every 6 (six) hours as needed for nausea or vomiting. 30 tablet 0  . sulfamethoxazole-trimethoprim (BACTRIM DS,SEPTRA DS) 800-160 MG tablet     . traMADol (ULTRAM) 50 MG tablet Take 1-2 tablets (50-100 mg total) by mouth every 6 (six) hours as needed. 30 tablet 1   No current facility-administered medications for this visit.    Facility-Administered Medications Ordered in Other Visits  Medication Dose Route Frequency Provider Last Rate Last Dose  . acetaminophen (TYLENOL) tablet 650 mg  650 mg Oral Once Brunetta Genera, MD      . bendamustine [facility]) 150 mg in sodium chloride 0.9 % 50 mL (2.6786 mg/mL) chemo infusion  70 mg/m2 (Treatment Plan Recorded) Intravenous Once Brunetta Genera, MD      . dexamethasone (DECADRON) injection 10 mg  10 mg Intravenous Once Brunetta Genera, MD      . diphenhydrAMINE (BENADRYL) capsule 50 mg  50 mg Oral Once Brunetta Genera, MD      . fludeoxyglucose F - 18 (FDG) injection 19.5 millicurie  09.3 millicurie Intravenous Once PRN Rolm Baptise, MD      . heparin lock flush 100 unit/mL  500 Units Intracatheter Once PRN Brunetta Genera, MD      . palonosetron (ALOXI) injection 0.25 mg  0.25 mg Intravenous Once Brunetta Genera, MD      . riTUXimab (RITUXAN) 800 mg in sodium chloride 0.9 % 250 mL (2.4242 mg/mL) chemo infusion  375 mg/m2 (Treatment Plan Recorded) Intravenous Once Brunetta Genera, MD      . sodium chloride flush (NS) 0.9 % injection 10 mL  10 mL Intracatheter PRN [place], MD        REVIEW OF SYSTEMS:    10 Point review of Systems was done is negative except as noted above.  PHYSICAL EXAMINATION: ECOG PERFORMANCE STATUS: 1 - Symptomatic but completely  ambulatory Vital signs recorded in the EMR, reviewed.  GENERAL:alert, NAD but anxious due to her new diagnosis. SKIN: some indurated at the site of the rt inguinal LN biopsy with some serous drainage. EYES: normal, conjunctiva are pink and non-injected, sclera clear OROPHARYNX:no exudate, no erythema and lips, buccal mucosa, and tongue normal  NECK: supple, no JVD, thyroid normal size, non-tender, without nodularity LYMPH:  no palpable lymphadenopathy in the cervical, axillary or inguinal area LUNGS: clear to auscultation with normal respiratory effort HEART: regular rate & rhythm,  no murmurs and trace lower extremity edema ABDOMEN: abdomen soft, non-tender, normoactive bowel sounds, no palpable hepatosplenomegaly  Musculoskeletal: no cyanosis  of digits and no clubbing  PSYCH: alert & oriented x 3 with fluent speech NEURO: no focal motor/sensory deficits  LABORATORY DATA:  I have reviewed the data as listed  . CBC Latest Ref Rng & Units 07/02/2016 06/04/2016 05/07/2016  WBC 3.9 - 10.3 10e3/uL 6.2 5.7 6.8  Hemoglobin 11.6 - 15.9 g/dL 12.5 12.6 13.3  Hematocrit 34.8 - 46.6 % 37.4 38.5 41.7  Platelets 145 - 400 10e3/uL 149 177 170   . CBC    Component Value Date/Time   WBC 6.2 07/02/2016 0926   WBC 9.8 01/23/2016 1023   RBC 3.94 07/02/2016 0926   RBC 4.26 01/23/2016 1023   HGB 12.5 07/02/2016 0926   HCT 37.4 07/02/2016 0926   PLT 149 07/02/2016 0926   MCV 94.8 07/02/2016 0926   MCH 31.8 07/02/2016 0926   MCH 31.0 01/23/2016 1023   MCHC 33.6 07/02/2016 0926   MCHC 31.7 01/23/2016 1023   RDW 14.0 07/02/2016 0926   LYMPHSABS 1.8 07/02/2016 0926   MONOABS 0.7 07/02/2016 0926   EOSABS 0.2 07/02/2016 0926   BASOSABS 0.0 07/02/2016 0926    . CMP Latest Ref Rng & Units 07/02/2016 06/04/2016 05/07/2016  Glucose 70 - 140 mg/dl 102 101 103  BUN 7.0 - 26.0 mg/dL 6.7(L) 10.9 11.5  Creatinine 0.6 - 1.1 mg/dL 0.8 0.8 0.8  Sodium 136 - 145 mEq/L 142 142 140  Potassium 3.5 - 5.1 mEq/L  3.7 3.9 4.0  Chloride 101 - 111 mmol/L - - -  CO2 22 - 29 mEq/L 27 27 25   Calcium 8.4 - 10.4 mg/dL 9.2 9.0 9.4  Total Protein 6.4 - 8.3 g/dL 6.7 6.6 7.2  Total Bilirubin 0.20 - 1.20 mg/dL 0.53 0.45 0.42  Alkaline Phos 40 - 150 U/L 56 65 68  AST 5 - 34 U/L 20 18 17   ALT 0 - 55 U/L 12 15 17    .          RADIOGRAPHIC STUDIES: I have personally reviewed the radiological images as listed and agreed with the findings in the report. Nm Pet Image Restag (ps) Skull Base To Thigh  Result Date: 06/28/2016 CLINICAL DATA:  Subsequent treatment strategy for non-Hodgkin's stage IV follicular lymphoma. Interval chemotherapy. EXAM: NUCLEAR MEDICINE PET SKULL BASE TO THIGH TECHNIQUE: 10.5 mCi F-18 FDG was injected intravenously. Full-ring PET imaging was performed from the skull base to thigh after the radiotracer. CT data was obtained and used for attenuation correction and anatomic localization. FASTING BLOOD GLUCOSE:  Value: 102 mg/dl COMPARISON:  12/22/2015 PET-CT. FINDINGS: NECK No hypermetabolic lymph nodes in the neck. CHEST Left anterior descending, left circumflex and right coronary atherosclerosis. Atherosclerotic nonaneurysmal thoracic aorta. No pneumothorax. No pleural effusions. No enlarged or hypermetabolic axillary, mediastinal or hilar lymph nodes. No acute consolidative airspace disease, lung masses or significant pulmonary nodules. ABDOMEN/PELVIS Near complete metabolic response in the retroperitoneal and pelvic adenopathy. Representative nodes as follows: Previously described lower aortocaval 1.6 cm node is decreased to 0.9 cm (series 4/ image 144) and demonstrates no residual hypermetabolism. Previously described left lower para- aortic 1.8 cm node with max SUV 7.6 is decreased to 1.5 cm with max SUV 3.3 (series 4/image 137), equivalent to the blood pool activity. Previously described 2.6 cm right common iliac node with max SUV 11.2 is decreased to 2.0 cm with max SUV 3.4 (series 4/image  154), essentially equivalent to the blood pool activity. Previously described mildly enlarged right external iliac and right inguinal lymph nodes are below 1 cm in  short axis and demonstrate no residual hypermetabolism. No newly enlarged or newly hypermetabolic abdominopelvic nodes. No abnormal hypermetabolic activity within the liver, pancreas, adrenal glands, or spleen. Spleen is normal in size and metabolism. Cholecystectomy. Atherosclerotic nonaneurysmal abdominal aorta. Mild sigmoid diverticulosis. Mildly thick walled new 3.3 x 1.7 cm fluid collection in the right inguinal soft tissues (series 4/image 203) without hypermetabolism, most compatible with post biopsy change/seroma. SKELETON No residual hypermetabolism in the left sacrum. No focal hypermetabolic activity to suggest skeletal metastasis. IMPRESSION: 1. Near complete metabolic response. Low level metabolism remains in the retroperitoneal and right common iliac adenopathy, essentially equivalent to the blood pool activity and significantly decreased in the interval. 2. New non-hypermetabolic fluid collection in the right inguinal soft tissues, most compatible with post biopsy change/seroma. 3. Chronic findings include aortic atherosclerosis, three-vessel coronary atherosclerosis and mild sigmoid diverticulosis. Electronically Signed   By: Ilona Sorrel M.D.   On: 06/28/2016 15:56    PET/CT 12/22/2015  IMPRESSION: 1. Hypermetabolic confluent lower retroperitoneal / bilateral common iliac, right external iliac and right inguinal lymphadenopathy, consistent with lymphoma. 2. Solitary focus of osseous hypermetabolism in the left upper sacrum consistent with osseous involvement by lymphoma. 3. No metabolically active lymphoma in the neck or chest. Spleen is normal in size and metabolism. 4. Additional findings include aortic atherosclerosis, three-vessel coronary atherosclerosis and mild sigmoid diverticulosis.   Electronically Signed   By:  Ilona Sorrel M.D.   On: 12/22/2015 17:21   ASSESSMENT & PLAN:   76 year old Caucasian female with multiple chronic medical comorbidities as noted above with   #1 Stage IVB Low grade Follicular Lymphoma with extensive retroperitoneal lymphadenopathy and right inguinal lymphadenopathy as well as osseous involvement of the sacrum. LDH level within normal limits.  S/p 3 cycles of BR (bendamustine 70mg /m2)  PET CT scan after 3 cycles of bendamustine and Rituxan done on 06/28/2016 shows Near complete metabolic response. Low level metabolism remains in the retroperitoneal and right common iliac adenopathy, essentially equivalent to the blood pool activity and significantly decreased in the interval. #2 Rt inguinal LN biopsy site infection and seroma -now resolved Plan - No prohibitive toxicities and labs stable. -will proceed with C4 of BR (will hold off on dose escalation of Bendamustine based on patietns co-morbidities and overall PS) -We discussed and patient agrees to complete a total of 5 cycles of BR - I.e 2 cycles after complete metabolic response. -We discussed the role of maintenance Rituxan after completion of her BR and she is inclined to proceed but wants to think about it before a final decision --continue current supportive medications -infection prevention strategies. -counseled on smoking cessation -patient still notes that "nothing will get me to quit smoking" -optimize po intake -continue BR as per schedule -We shall plan to repeat a PET/CT scan after completion of her treatment for post-treatment baseline.  #3 upper respiratory tract infection resolving after treatment with Augmentin as per primary care physician . Currently no febrile symptoms and symptoms are mostly resolved . -resolved.  #4 postmenopausal vaginal bleeding . Patient reports it occurred only one or 2 times and has resolved . -She declines offer for GYN referral . -f/u with PCP  Will complete 5 cycles of  BR RTC with Dr Irene Limbo in 4 weeks with labs with C5D1 of chemotherapy.  All of the patients , her friends and family members questions were answered to their apparent satisfaction. The patient knows to call the clinic with any problems, questions or concerns.  I spent 20  minutes counseling the patient face to face. The total time spent in the appointment was 25 minutes and more than 50% was on counseling and direct patient cares.    Sullivan Lone MD [place] AAHIVMS [facility] Hematology/Oncology Physician [facility]  (Office):       [phone] (Work cell):  [phone] (Fax):           [phone]

## 2016-07-02 NOTE — Telephone Encounter (Signed)
Appointments scheduled per 07/02/16 los. Patient was given a copy of the AVS report and appointment schedule, per 07/02/16 los.

## 2016-07-02 NOTE — Patient Instructions (Signed)
[facility] Discharge Instructions for Patients Receiving Chemotherapy  Today you received the following chemotherapy agents rituxan/bendeka  To help prevent nausea and vomiting after your treatment, we encourage you to take your nausea medication as directed  If you develop nausea and vomiting that is not controlled by your nausea medication, call the clinic.   BELOW ARE SYMPTOMS THAT SHOULD BE REPORTED IMMEDIATELY:  *FEVER GREATER THAN 100.5 F  *CHILLS WITH OR WITHOUT FEVER  NAUSEA AND VOMITING THAT IS NOT CONTROLLED WITH YOUR NAUSEA MEDICATION  *UNUSUAL SHORTNESS OF BREATH  *UNUSUAL BRUISING OR BLEEDING  TENDERNESS IN MOUTH AND THROAT WITH OR WITHOUT PRESENCE OF ULCERS  *URINARY PROBLEMS  *BOWEL PROBLEMS  UNUSUAL RASH Items with * indicate a potential emergency and should be followed up as soon as possible.  Feel free to call the clinic you have any questions or concerns. The clinic phone number is [phone].  

## 2016-07-03 ENCOUNTER — Ambulatory Visit (HOSPITAL_BASED_OUTPATIENT_CLINIC_OR_DEPARTMENT_OTHER): Payer: Medicare Other

## 2016-07-03 VITALS — BP 148/56 | HR 58 | Temp 98.0°F | Resp 18

## 2016-07-03 DIAGNOSIS — C8298 Follicular lymphoma, unspecified, lymph nodes of multiple sites: Secondary | ICD-10-CM | POA: Diagnosis not present

## 2016-07-03 DIAGNOSIS — Z5111 Encounter for antineoplastic chemotherapy: Secondary | ICD-10-CM

## 2016-07-03 MED ORDER — SODIUM CHLORIDE 0.9% FLUSH
10.0000 mL | INTRAVENOUS | Status: DC | PRN
  Filled 2016-07-03: qty 10

## 2016-07-03 MED ORDER — DEXAMETHASONE SODIUM PHOSPHATE 10 MG/ML IJ SOLN
INTRAMUSCULAR | Status: AC
  Filled 2016-07-03: qty 1

## 2016-07-03 MED ORDER — SODIUM CHLORIDE 0.9 % IV SOLN
70.0000 mg/m2 | Freq: Once | INTRAVENOUS | Status: AC
  Administered 2016-07-03: 150 mg via INTRAVENOUS
  Filled 2016-07-03: qty 20

## 2016-07-03 MED ORDER — DEXAMETHASONE SODIUM PHOSPHATE 10 MG/ML IJ SOLN
10.0000 mg | Freq: Once | INTRAMUSCULAR | Status: AC
  Administered 2016-07-03: 10 mg via INTRAVENOUS

## 2016-07-03 MED ORDER — SODIUM CHLORIDE 0.9 % IV SOLN
Freq: Once | INTRAVENOUS | Status: AC
  Administered 2016-07-03: 14:00:00 via INTRAVENOUS

## 2016-07-03 MED ORDER — HEPARIN SOD (PORK) LOCK FLUSH 100 UNIT/ML IV SOLN
500.0000 [IU] | Freq: Once | INTRAVENOUS | Status: DC | PRN
  Filled 2016-07-03: qty 5

## 2016-07-03 NOTE — Patient Instructions (Signed)
[facility] Discharge Instructions for Patients Receiving Chemotherapy   Today you received the following chemotherapy agents Treanda   To help prevent nausea and vomiting after your treatment, we encourage you to take your nausea medication as directed.    If you develop nausea and vomiting that is not controlled by your nausea medication, call the clinic.   BELOW ARE SYMPTOMS THAT SHOULD BE REPORTED IMMEDIATELY:  *FEVER GREATER THAN 100.5 F  *CHILLS WITH OR WITHOUT FEVER  NAUSEA AND VOMITING THAT IS NOT CONTROLLED WITH YOUR NAUSEA MEDICATION  *UNUSUAL SHORTNESS OF BREATH  *UNUSUAL BRUISING OR BLEEDING  TENDERNESS IN MOUTH AND THROAT WITH OR WITHOUT PRESENCE OF ULCERS  *URINARY PROBLEMS  *BOWEL PROBLEMS  UNUSUAL RASH Items with * indicate a potential emergency and should be followed up as soon as possible.  Feel free to call the clinic you have any questions or concerns. The clinic phone number is [phone].  Please show the [facility] at check-in to the Emergency Department and triage nurse.

## 2016-07-30 ENCOUNTER — Telehealth: Payer: Self-pay | Admitting: Hematology

## 2016-07-30 ENCOUNTER — Other Ambulatory Visit (HOSPITAL_BASED_OUTPATIENT_CLINIC_OR_DEPARTMENT_OTHER): Payer: Medicare Other

## 2016-07-30 ENCOUNTER — Ambulatory Visit (HOSPITAL_BASED_OUTPATIENT_CLINIC_OR_DEPARTMENT_OTHER): Payer: Medicare Other | Admitting: Hematology

## 2016-07-30 ENCOUNTER — Encounter: Payer: Self-pay | Admitting: Hematology

## 2016-07-30 ENCOUNTER — Ambulatory Visit (HOSPITAL_BASED_OUTPATIENT_CLINIC_OR_DEPARTMENT_OTHER): Payer: Medicare Other

## 2016-07-30 VITALS — BP 170/68 | HR 66 | Temp 99.0°F | Resp 18 | Ht 70.0 in | Wt 207.3 lb

## 2016-07-30 VITALS — BP 148/46 | HR 61 | Temp 98.4°F | Resp 16

## 2016-07-30 DIAGNOSIS — C8298 Follicular lymphoma, unspecified, lymph nodes of multiple sites: Secondary | ICD-10-CM

## 2016-07-30 DIAGNOSIS — Z5111 Encounter for antineoplastic chemotherapy: Secondary | ICD-10-CM

## 2016-07-30 DIAGNOSIS — E876 Hypokalemia: Secondary | ICD-10-CM

## 2016-07-30 DIAGNOSIS — Z5112 Encounter for antineoplastic immunotherapy: Secondary | ICD-10-CM

## 2016-07-30 DIAGNOSIS — Z720 Tobacco use: Secondary | ICD-10-CM

## 2016-07-30 LAB — CBC & DIFF AND RETIC
BASO%: 0.5 % (ref 0.0–2.0)
Basophils Absolute: 0 10*3/uL (ref 0.0–0.1)
EOS%: 4.8 % (ref 0.0–7.0)
Eosinophils Absolute: 0.2 10*3/uL (ref 0.0–0.5)
HCT: 35.1 % (ref 34.8–46.6)
HGB: 11.6 g/dL (ref 11.6–15.9)
Immature Retic Fract: 5.8 % (ref 1.60–10.00)
LYMPH%: 27.1 % (ref 14.0–49.7)
MCH: 32 pg (ref 25.1–34.0)
MCHC: 33 g/dL (ref 31.5–36.0)
MCV: 97 fL (ref 79.5–101.0)
MONO#: 0.5 10*3/uL (ref 0.1–0.9)
MONO%: 14.4 % — ABNORMAL HIGH (ref 0.0–14.0)
NEUT%: 53.2 % (ref 38.4–76.8)
NEUTROS ABS: 2 10*3/uL (ref 1.5–6.5)
Platelets: 144 10*3/uL — ABNORMAL LOW (ref 145–400)
RBC: 3.62 10*6/uL — ABNORMAL LOW (ref 3.70–5.45)
RDW: 13.5 % (ref 11.2–14.5)
Retic %: 1.63 % (ref 0.70–2.10)
Retic Ct Abs: 59.01 10*3/uL (ref 33.70–90.70)
WBC: 3.8 10*3/uL — AB (ref 3.9–10.3)
lymph#: 1 10*3/uL (ref 0.9–3.3)

## 2016-07-30 LAB — COMPREHENSIVE METABOLIC PANEL
ALT: 14 U/L (ref 0–55)
AST: 24 U/L (ref 5–34)
Albumin: 3.5 g/dL (ref 3.5–5.0)
Alkaline Phosphatase: 62 U/L (ref 40–150)
Anion Gap: 8 mEq/L (ref 3–11)
BUN: 7.5 mg/dL (ref 7.0–26.0)
CHLORIDE: 105 meq/L (ref 98–109)
CO2: 29 mEq/L (ref 22–29)
CREATININE: 0.7 mg/dL (ref 0.6–1.1)
Calcium: 8.9 mg/dL (ref 8.4–10.4)
EGFR: 81 mL/min/{1.73_m2} — AB (ref >90)
Glucose: 102 mg/dl (ref 70–140)
POTASSIUM: 3.2 meq/L — AB (ref 3.5–5.1)
SODIUM: 143 meq/L (ref 136–145)
Total Bilirubin: 0.49 mg/dL (ref 0.20–1.20)
Total Protein: 6.6 g/dL (ref 6.4–8.3)

## 2016-07-30 LAB — LACTATE DEHYDROGENASE: LDH: 359 U/L — AB (ref 125–245)

## 2016-07-30 MED ORDER — ACETAMINOPHEN 325 MG PO TABS
650.0000 mg | ORAL_TABLET | Freq: Once | ORAL | Status: AC
Start: 2016-07-30 — End: 2016-07-30
  Administered 2016-07-30: 650 mg via ORAL

## 2016-07-30 MED ORDER — SODIUM CHLORIDE 0.9 % IV SOLN
Freq: Once | INTRAVENOUS | Status: AC
  Administered 2016-07-30: 12:00:00 via INTRAVENOUS

## 2016-07-30 MED ORDER — POTASSIUM CHLORIDE CRYS ER 20 MEQ PO TBCR
40.0000 meq | EXTENDED_RELEASE_TABLET | Freq: Once | ORAL | Status: AC
  Administered 2016-07-30: 40 meq via ORAL
  Filled 2016-07-30: qty 2

## 2016-07-30 MED ORDER — DIPHENHYDRAMINE HCL 25 MG PO CAPS
ORAL_CAPSULE | ORAL | Status: AC
  Filled 2016-07-30: qty 2

## 2016-07-30 MED ORDER — POTASSIUM CHLORIDE CRYS ER 20 MEQ PO TBCR: 20.0000 meq | EXTENDED_RELEASE_TABLET | Freq: Every day | ORAL | 0 refills | Status: DC

## 2016-07-30 MED ORDER — ACETAMINOPHEN 325 MG PO TABS
ORAL_TABLET | ORAL | Status: AC
  Filled 2016-07-30: qty 2

## 2016-07-30 MED ORDER — DIPHENHYDRAMINE HCL 25 MG PO CAPS
50.0000 mg | ORAL_CAPSULE | Freq: Once | ORAL | Status: AC
  Administered 2016-07-30: 50 mg via ORAL

## 2016-07-30 MED ORDER — SODIUM CHLORIDE 0.9 % IV SOLN
70.0000 mg/m2 | Freq: Once | INTRAVENOUS | Status: AC
  Administered 2016-07-30: 150 mg via INTRAVENOUS
  Filled 2016-07-30: qty 6

## 2016-07-30 MED ORDER — PALONOSETRON HCL INJECTION 0.25 MG/5ML
INTRAVENOUS | Status: AC
  Filled 2016-07-30: qty 5

## 2016-07-30 MED ORDER — SODIUM CHLORIDE 0.9 % IV SOLN
375.0000 mg/m2 | Freq: Once | INTRAVENOUS | Status: AC
  Administered 2016-07-30: 800 mg via INTRAVENOUS
  Filled 2016-07-30: qty 50

## 2016-07-30 MED ORDER — PALONOSETRON HCL INJECTION 0.25 MG/5ML
0.2500 mg | Freq: Once | INTRAVENOUS | Status: AC
  Administered 2016-07-30: 0.25 mg via INTRAVENOUS

## 2016-07-30 MED ORDER — DEXAMETHASONE SODIUM PHOSPHATE 10 MG/ML IJ SOLN
INTRAMUSCULAR | Status: AC
  Filled 2016-07-30: qty 1

## 2016-07-30 MED ORDER — DEXAMETHASONE SODIUM PHOSPHATE 10 MG/ML IJ SOLN
10.0000 mg | Freq: Once | INTRAMUSCULAR | Status: AC
  Administered 2016-07-30: 10 mg via INTRAVENOUS

## 2016-07-30 NOTE — Patient Instructions (Signed)
[place] Discharge Instructions for Patients Receiving Chemotherapy  Today you received the following chemotherapy agents Rituxan and Bendamustine.  To help prevent nausea and vomiting after your treatment, we encourage you to take your nausea medication as directed.  If you develop nausea and vomiting that is not controlled by your nausea medication, call the clinic.   BELOW ARE SYMPTOMS THAT SHOULD BE REPORTED IMMEDIATELY:  *FEVER GREATER THAN 100.5 F  *CHILLS WITH OR WITHOUT FEVER  NAUSEA AND VOMITING THAT IS NOT CONTROLLED WITH YOUR NAUSEA MEDICATION  *UNUSUAL SHORTNESS OF BREATH  *UNUSUAL BRUISING OR BLEEDING  TENDERNESS IN MOUTH AND THROAT WITH OR WITHOUT PRESENCE OF ULCERS  *URINARY PROBLEMS  *BOWEL PROBLEMS  UNUSUAL RASH Items with * indicate a potential emergency and should be followed up as soon as possible.  Feel free to call the clinic you have any questions or concerns. The clinic phone number is [phone].

## 2016-07-30 NOTE — Progress Notes (Signed)
HEMATOLOGY/ONCOLOGY CLINIC NOTE  Date of Service: .07/30/2016  Patient Care Team: Charolette Forward, MD as PCP - General (Cardiology) Physical medicine and rehabilitation: Sharlet Salina DO  CHIEF COMPLAINTS/PURPOSE OF CONSULTATION:  Follow-up for follicular lymphoma  INTERVAL HISTORY  Patient is here for f/u prior to her cycle 5 of BR. No overt new toxicities. PET/CT scan done after 3 cycles of BR shows near complete metabolic response. Labs look stable today and she is okay with proceeding with her next cycle of treatment. She continues to smoke significantly and notes that she is trying to cut down. No fevers or chills no night sweats no abdominal pain.  MEDICAL HISTORY:  Past Medical History:  Diagnosis Date  . A-fib ([place])   . Arthritis   . Chronic back pain   . COPD (chronic obstructive pulmonary disease) ([place])   . Coronary artery disease   . Glaucoma   . Hearing aid worn   . High cholesterol   . Hypertension   . Lymphadenopathy 01/25/2016  . Myocardial infarction ([place])    2000  . Non Hodgkin's lymphoma ([place])   . Wears dentures    " Top plate"  . Wears glasses   #1 bilateral cataracts #2 COPD  #3 active smoker half a pack per day for about 60 years #4 degenerative arthritis - has had chronic back pain issues with multiple epidural steroid injections from 2014 [5 times] #5 glaucoma #6 degenerative arthritis #7 history of TIA #8 dyslipidemia #9 hypertension #10 coronary artery disease with inferior wall MI in the past, status post PCI to RCA in 2001 #11 hypothyroidism #12 lumbar radiculopathy/neuropathy. #13 paroxysmal atrial fibrillation on anticoagulation with Eliquis. #14 type 2 diabetes #16 obesity  SURGICAL HISTORY:  #1 cardiac catheterization #2 cholecystectomy #3 colonoscopy #4 wisdom teeth extraction.  SOCIAL HISTORY: Currently single. Notes that she lost her female partner 9 months ago. Active smoker half pack per day since her  teens  FAMILY HISTORY: Patient reports no history of cancers or blood disorders in the family that she is aware of.  ALLERGIES:  is allergic to hydrocodone and oxycodone.  MEDICATIONS:  Current Outpatient Prescriptions  Medication Sig Dispense Refill  . acyclovir (ZOVIRAX) 400 MG tablet Take 1 tablet (400 mg total) by mouth daily. 30 tablet 3  . ALPRAZolam (XANAX) 0.25 MG tablet Take 0.25 mg by mouth daily as needed for anxiety.     Marland Kitchen amiodarone (PACERONE) 200 MG tablet Take 100 mg by mouth every morning.    Marland Kitchen atorvastatin (LIPITOR) 40 MG tablet Take 40 mg by mouth every morning.     . Bimatoprost (LUMIGAN OP) Place 1 drop into both eyes at bedtime.    Marland Kitchen dexamethasone (DECADRON) 4 MG tablet Take 2 tablets (8 mg total) by mouth daily. Start the day after bendamustine chemotherapy for 2 days. Take with food. 30 tablet 1  . doxycycline (VIBRA-TABS) 100 MG tablet Take 1 tablet (100 mg total) by mouth 2 (two) times daily. 14 tablet 0  . ELIQUIS 5 MG TABS tablet Take 5 mg by mouth 2 (two) times daily.    Marland Kitchen gabapentin (NEURONTIN) 300 MG capsule Take 300 mg by mouth 2 (two) times daily.    . Hypromellose (ARTIFICIAL TEARS OP) Place 1 drop into both eyes daily as needed. Dry eyes    . losartan (COZAAR) 50 MG tablet Take 50 mg by mouth every morning.    . Melatonin 5 MG TABS Take 5 mg by mouth at bedtime.    Marland Kitchen  meloxicam (MOBIC) 7.5 MG tablet Take 7.5 mg by mouth at bedtime.    . metoprolol succinate (TOPROL-XL) 50 MG 24 hr tablet Take 50 mg by mouth daily.    . nitroGLYCERIN (NITROSTAT) 0.4 MG SL tablet Place 0.4 mg under the tongue every 5 (five) minutes x 3 doses as needed for chest pain.     Marland Kitchen ondansetron (ZOFRAN) 8 MG tablet Take 1 tablet (8 mg total) by mouth 2 (two) times daily as needed for refractory nausea / vomiting. Start on day 2 after bendamustine chemo 30 tablet 1  . prochlorperazine (COMPAZINE) 10 MG tablet Take 1 tablet (10 mg total) by mouth every 6 (six) hours as needed for nausea  or vomiting. 30 tablet 0  . sulfamethoxazole-trimethoprim (BACTRIM DS,SEPTRA DS) 800-160 MG tablet     . traMADol (ULTRAM) 50 MG tablet Take 1-2 tablets (50-100 mg total) by mouth every 6 (six) hours as needed. 30 tablet 1   No current facility-administered medications for this visit.     REVIEW OF SYSTEMS:    10 Point review of Systems was done is negative except as noted above.  PHYSICAL EXAMINATION: ECOG PERFORMANCE STATUS: 1 - Symptomatic but completely ambulatory Vital signs recorded in the EMR, reviewed.  GENERAL:alert, NAD but anxious due to her new diagnosis. SKIN: some indurated at the site of the rt inguinal LN biopsy with some serous drainage. EYES: normal, conjunctiva are pink and non-injected, sclera clear OROPHARYNX:no exudate, no erythema and lips, buccal mucosa, and tongue normal  NECK: supple, no JVD, thyroid normal size, non-tender, without nodularity LYMPH:  no palpable lymphadenopathy in the cervical, axillary or inguinal area LUNGS: clear to auscultation with normal respiratory effort HEART: regular rate & rhythm,  no murmurs and trace lower extremity edema ABDOMEN: abdomen soft, non-tender, normoactive bowel sounds, no palpable hepatosplenomegaly  Musculoskeletal: no cyanosis of digits and no clubbing  PSYCH: alert & oriented x 3 with fluent speech NEURO: no focal motor/sensory deficits  LABORATORY DATA:  I have reviewed the data as listed  . CBC Latest Ref Rng & Units 07/02/2016 06/04/2016 05/07/2016  WBC 3.9 - 10.3 10e3/uL 6.2 5.7 6.8  Hemoglobin 11.6 - 15.9 g/dL 12.5 12.6 13.3  Hematocrit 34.8 - 46.6 % 37.4 38.5 41.7  Platelets 145 - 400 10e3/uL 149 177 170   . CBC    Component Value Date/Time   WBC 6.2 07/02/2016 0926   WBC 9.8 01/23/2016 1023   RBC 3.94 07/02/2016 0926   RBC 4.26 01/23/2016 1023   HGB 12.5 07/02/2016 0926   HCT 37.4 07/02/2016 0926   PLT 149 07/02/2016 0926   MCV 94.8 07/02/2016 0926   MCH 31.8 07/02/2016 0926   MCH 31.0  01/23/2016 1023   MCHC 33.6 07/02/2016 0926   MCHC 31.7 01/23/2016 1023   RDW 14.0 07/02/2016 0926   LYMPHSABS 1.8 07/02/2016 0926   MONOABS 0.7 07/02/2016 0926   EOSABS 0.2 07/02/2016 0926   BASOSABS 0.0 07/02/2016 0926    . CMP Latest Ref Rng & Units 07/02/2016 06/04/2016 05/07/2016  Glucose 70 - 140 mg/dl 102 101 103  BUN 7.0 - 26.0 mg/dL 6.7(L) 10.9 11.5  Creatinine 0.6 - 1.1 mg/dL 0.8 0.8 0.8  Sodium 136 - 145 mEq/L 142 142 140  Potassium 3.5 - 5.1 mEq/L 3.7 3.9 4.0  Chloride 101 - 111 mmol/L - - -  CO2 22 - 29 mEq/L 27 27 25   Calcium 8.4 - 10.4 mg/dL 9.2 9.0 9.4  Total Protein 6.4 - 8.3 g/dL  6.7 6.6 7.2  Total Bilirubin 0.20 - 1.20 mg/dL 0.53 0.45 0.42  Alkaline Phos 40 - 150 U/L 56 65 68  AST 5 - 34 U/L 20 18 17   ALT 0 - 55 U/L 12 15 17    .          RADIOGRAPHIC STUDIES: I have personally reviewed the radiological images as listed and agreed with the findings in the report. No results found.  PET/CT 12/22/2015  IMPRESSION: 1. Hypermetabolic confluent lower retroperitoneal / bilateral common iliac, right external iliac and right inguinal lymphadenopathy, consistent with lymphoma. 2. Solitary focus of osseous hypermetabolism in the left upper sacrum consistent with osseous involvement by lymphoma. 3. No metabolically active lymphoma in the neck or chest. Spleen is normal in size and metabolism. 4. Additional findings include aortic atherosclerosis, three-vessel coronary atherosclerosis and mild sigmoid diverticulosis.   Electronically Signed   By: Ilona Sorrel M.D.   On: 12/22/2015 17:21   ASSESSMENT & PLAN:   76 year old Caucasian female with multiple chronic medical comorbidities as noted above with   #1 Stage IVB Low grade Follicular Lymphoma with extensive retroperitoneal lymphadenopathy and right inguinal lymphadenopathy as well as osseous involvement of the sacrum. LDH level within normal limits.  S/p 4 cycles of BR (Bendamustine  70mg /m2)  PET CT scan after 3 cycles of bendamustine and Rituxan done on 06/28/2016 shows Near complete metabolic response. Low level metabolism remains in the retroperitoneal and right common iliac adenopathy, essentially equivalent to the blood pool activity and significantly decreased in the interval. #2 Rt inguinal LN biopsy site infection and seroma -now resolved Plan - No prohibitive toxicities and labs stable. -will proceed with C5 of BR (will hold off on dose escalation of Bendamustine based on patietns co-morbidities and overall PS) -continue current supportive medications -infection prevention strategies. -counseled on smoking cessation -patient still somewhat resistant to cut down but sounds like she is a little more open to the idea. -Repeat PET/CT scan in 5 weeks . -Return to clinic with Dr. Irene Limbo in 6 weeks with repeat labs . -We shall discuss the pros and cons of maintenance Rituxan upon follow-up   PET/CT in 5 weeks RTC with Dr Irene Limbo in 6 weeks with labs  All of the patients , her friends and family members questions were answered to their apparent satisfaction. The patient knows to call the clinic with any problems, questions or concerns.  I spent 20 minutes counseling the patient face to face. The total time spent in the appointment was 25 minutes and more than 50% was on counseling and direct patient cares.    Sullivan Lone MD [place] AAHIVMS [facility] Hematology/Oncology Physician [facility] [place]  (Office):       [phone] (Work cell):  [phone] (Fax):           [phone]

## 2016-07-30 NOTE — Patient Instructions (Signed)
Thank you for choosing [facility] to provide your oncology and hematology care.  To afford each patient quality time with our providers, please arrive 30 minutes before your scheduled appointment time.  If you arrive late for your appointment, you may be asked to reschedule.  We strive to give you quality time with our providers, and arriving late affects you and other patients whose appointments are after yours.  If you are a no show for multiple scheduled visits, you may be dismissed from the clinic at the providers discretion.   Again, thank you for choosing [facility], our hope is that these requests will decrease the amount of time that you wait before being seen by our physicians.  ______________________________________________________________________ Should you have questions after your visit to the [facility], please contact our office at [phone] between the hours of 8:30 and 4:30 p.m.    Voicemails left after 4:30p.m will not be returned until the following business day.   For prescription refill requests, please have your pharmacy contact us directly.  Please also try to allow 48 hours for prescription requests.   Please contact the scheduling department for questions regarding scheduling.  For scheduling of procedures such as PET scans, CT scans, MRI, Ultrasound, etc please contact central scheduling at [phone].   Resources For Cancer Patients and Caregivers:  American Cancer Society:  800-227-2345  Can help patients locate various types of support and financial assistance Cancer Care: 1-800-813-HOPE (4673) Provides financial assistance, online support groups, medication/co-pay assistance.   [place] DSS:  336-641-3447 Where to apply for food stamps, Medicaid, and utility assistance Medicare Rights Center: 800-333-4114 Helps people with Medicare understand their rights and benefits, navigate the Medicare system, and secure the  quality healthcare they deserve SCAT: 336-333-6589 [place] [employer]'s shared-ride transportation service for eligible riders who have a disability that prevents them from riding the fixed route bus.   For additional information on assistance programs please contact our social worker:   Grier Hock/Abigail Elmore:  [phone] 

## 2016-07-30 NOTE — Telephone Encounter (Signed)
Gave patient AVS and calender per 5/12 los . [facility] to contact patient with PET scan schedule.

## 2016-07-31 ENCOUNTER — Ambulatory Visit (HOSPITAL_BASED_OUTPATIENT_CLINIC_OR_DEPARTMENT_OTHER): Payer: Medicare Other

## 2016-07-31 VITALS — BP 152/56 | HR 63 | Temp 98.4°F | Resp 18

## 2016-07-31 DIAGNOSIS — Z5111 Encounter for antineoplastic chemotherapy: Secondary | ICD-10-CM | POA: Diagnosis not present

## 2016-07-31 DIAGNOSIS — C8298 Follicular lymphoma, unspecified, lymph nodes of multiple sites: Secondary | ICD-10-CM | POA: Diagnosis not present

## 2016-07-31 MED ORDER — BENDAMUSTINE HCL CHEMO INJECTION 100 MG/4ML
70.0000 mg/m2 | Freq: Once | INTRAVENOUS | Status: AC
  Administered 2016-07-31: 150 mg via INTRAVENOUS
  Filled 2016-07-31: qty 6

## 2016-07-31 MED ORDER — DEXAMETHASONE SODIUM PHOSPHATE 10 MG/ML IJ SOLN
INTRAMUSCULAR | Status: AC
  Filled 2016-07-31: qty 1

## 2016-07-31 MED ORDER — SODIUM CHLORIDE 0.9 % IV SOLN
Freq: Once | INTRAVENOUS | Status: AC
  Administered 2016-07-31: 14:00:00 via INTRAVENOUS

## 2016-07-31 MED ORDER — DEXAMETHASONE SODIUM PHOSPHATE 10 MG/ML IJ SOLN
10.0000 mg | Freq: Once | INTRAMUSCULAR | Status: AC
  Administered 2016-07-31: 10 mg via INTRAVENOUS

## 2016-07-31 NOTE — Patient Instructions (Signed)
[facility] Discharge Instructions for Patients Receiving Chemotherapy  Today you received the following chemotherapy agents Bendeka.   To help prevent nausea and vomiting after your treatment, we encourage you to take your nausea medication as directed.    If you develop nausea and vomiting that is not controlled by your nausea medication, call the clinic.   BELOW ARE SYMPTOMS THAT SHOULD BE REPORTED IMMEDIATELY:  *FEVER GREATER THAN 100.5 F  *CHILLS WITH OR WITHOUT FEVER  NAUSEA AND VOMITING THAT IS NOT CONTROLLED WITH YOUR NAUSEA MEDICATION  *UNUSUAL SHORTNESS OF BREATH  *UNUSUAL BRUISING OR BLEEDING  TENDERNESS IN MOUTH AND THROAT WITH OR WITHOUT PRESENCE OF ULCERS  *URINARY PROBLEMS  *BOWEL PROBLEMS  UNUSUAL RASH Items with * indicate a potential emergency and should be followed up as soon as possible.  Feel free to call the clinic you have any questions or concerns. The clinic phone number is [phone].  Please show the CHEMO ALERT CARD at check-in to the Emergency Department and triage nurse.   

## 2016-09-06 ENCOUNTER — Encounter (HOSPITAL_COMMUNITY)
Admission: RE | Admit: 2016-09-06 | Discharge: 2016-09-06 | Disposition: A | Discharge status: Home / Self Care | Payer: Medicare Other | Source: Ambulatory Visit | Attending: Hematology | Admitting: Hematology

## 2016-09-06 DIAGNOSIS — C8298 Follicular lymphoma, unspecified, lymph nodes of multiple sites: Secondary | ICD-10-CM | POA: Diagnosis not present

## 2016-09-06 LAB — GLUCOSE, CAPILLARY: Glucose-Capillary: 115 mg/dL — ABNORMAL HIGH (ref 65–99)

## 2016-09-06 MED ORDER — SODIUM FLUORIDE F-18
Freq: Once | INTRAVENOUS | Status: AC
  Administered 2016-09-06: 0.1 via INTRAVENOUS

## 2016-09-06 MED ORDER — FLUDEOXYGLUCOSE F - 18 (FDG) INJECTION
10.4000 | Freq: Once | INTRAVENOUS | Status: AC | PRN
  Administered 2016-09-06: 10.4 via INTRAVENOUS

## 2016-09-10 ENCOUNTER — Ambulatory Visit (HOSPITAL_BASED_OUTPATIENT_CLINIC_OR_DEPARTMENT_OTHER): Payer: Medicare Other | Admitting: Hematology

## 2016-09-10 ENCOUNTER — Encounter: Payer: Self-pay | Admitting: Hematology

## 2016-09-10 ENCOUNTER — Other Ambulatory Visit (HOSPITAL_BASED_OUTPATIENT_CLINIC_OR_DEPARTMENT_OTHER): Payer: Medicare Other

## 2016-09-10 ENCOUNTER — Telehealth: Payer: Self-pay | Admitting: Hematology

## 2016-09-10 VITALS — BP 155/54 | HR 58 | Temp 98.6°F | Resp 20 | Ht 70.0 in | Wt 205.0 lb

## 2016-09-10 DIAGNOSIS — C8298 Follicular lymphoma, unspecified, lymph nodes of multiple sites: Secondary | ICD-10-CM

## 2016-09-10 DIAGNOSIS — Z8572 Personal history of non-Hodgkin lymphomas: Secondary | ICD-10-CM

## 2016-09-10 LAB — COMPREHENSIVE METABOLIC PANEL
ALT: 12 U/L (ref 0–55)
ANION GAP: 8 meq/L (ref 3–11)
AST: 18 U/L (ref 5–34)
Albumin: 3.5 g/dL (ref 3.5–5.0)
Alkaline Phosphatase: 58 U/L (ref 40–150)
BUN: 12.3 mg/dL (ref 7.0–26.0)
CALCIUM: 9.2 mg/dL (ref 8.4–10.4)
CO2: 27 mEq/L (ref 22–29)
Chloride: 105 mEq/L (ref 98–109)
Creatinine: 0.8 mg/dL (ref 0.6–1.1)
EGFR: 71 mL/min/{1.73_m2} — ABNORMAL LOW (ref >90)
Glucose: 104 mg/dl (ref 70–140)
POTASSIUM: 4.3 meq/L (ref 3.5–5.1)
Sodium: 141 mEq/L (ref 136–145)
Total Bilirubin: 0.45 mg/dL (ref 0.20–1.20)
Total Protein: 6.5 g/dL (ref 6.4–8.3)

## 2016-09-10 LAB — CBC & DIFF AND RETIC
BASO%: 0.5 % (ref 0.0–2.0)
Basophils Absolute: 0 10*3/uL (ref 0.0–0.1)
EOS%: 3.6 % (ref 0.0–7.0)
Eosinophils Absolute: 0.2 10*3/uL (ref 0.0–0.5)
HEMATOCRIT: 38.2 % (ref 34.8–46.6)
HGB: 12.6 g/dL (ref 11.6–15.9)
Immature Retic Fract: 4.5 % (ref 1.60–10.00)
LYMPH#: 1.8 10*3/uL (ref 0.9–3.3)
LYMPH%: 29.2 % (ref 14.0–49.7)
MCH: 32 pg (ref 25.1–34.0)
MCHC: 33 g/dL (ref 31.5–36.0)
MCV: 97 fL (ref 79.5–101.0)
MONO#: 0.7 10*3/uL (ref 0.1–0.9)
MONO%: 11.1 % (ref 0.0–14.0)
NEUT#: 3.4 10*3/uL (ref 1.5–6.5)
NEUT%: 55.6 % (ref 38.4–76.8)
PLATELETS: 140 10*3/uL — AB (ref 145–400)
RBC: 3.94 10*6/uL (ref 3.70–5.45)
RDW: 12.9 % (ref 11.2–14.5)
RETIC %: 0.9 % (ref 0.70–2.10)
Retic Ct Abs: 35.46 10*3/uL (ref 33.70–90.70)
WBC: 6.1 10*3/uL (ref 3.9–10.3)
nRBC: 0 % (ref 0–0)

## 2016-09-10 LAB — LACTATE DEHYDROGENASE: LDH: 203 U/L (ref 125–245)

## 2016-09-10 NOTE — Patient Instructions (Signed)
Thank you for choosing [facility] to provide your oncology and hematology care.  To afford each patient quality time with our providers, please arrive 30 minutes before your scheduled appointment time.  If you arrive late for your appointment, you may be asked to reschedule.  We strive to give you quality time with our providers, and arriving late affects you and other patients whose appointments are after yours.  If you are a no show for multiple scheduled visits, you may be dismissed from the clinic at the providers discretion.   Again, thank you for choosing [facility], our hope is that these requests will decrease the amount of time that you wait before being seen by our physicians.  ______________________________________________________________________ Should you have questions after your visit to the [facility], please contact our office at [phone] between the hours of 8:30 and 4:30 p.m.    Voicemails left after 4:30p.m will not be returned until the following business day.   For prescription refill requests, please have your pharmacy contact us directly.  Please also try to allow 48 hours for prescription requests.   Please contact the scheduling department for questions regarding scheduling.  For scheduling of procedures such as PET scans, CT scans, MRI, Ultrasound, etc please contact central scheduling at [phone].   Resources For Cancer Patients and Caregivers:  American Cancer Society:  800-227-2345  Can help patients locate various types of support and financial assistance Cancer Care: 1-800-813-HOPE (4673) Provides financial assistance, online support groups, medication/co-pay assistance.   [place] DSS:  336-641-3447 Where to apply for food stamps, Medicaid, and utility assistance Medicare Rights Center: 800-333-4114 Helps people with Medicare understand their rights and benefits, navigate the Medicare system, and secure the  quality healthcare they deserve SCAT: 336-333-6589 [place] [employer]'s shared-ride transportation service for eligible riders who have a disability that prevents them from riding the fixed route bus.   For additional information on assistance programs please contact our social worker:   Grier Hock/Abigail Elmore:  [phone] 

## 2016-09-10 NOTE — Telephone Encounter (Signed)
Scheduled appt pe r6/25 los - gave patient AVS and calender per los. Lab and f.u in 3 months.

## 2016-09-17 NOTE — Progress Notes (Signed)
HEMATOLOGY/ONCOLOGY CLINIC NOTE  Date of Service: .09/10/2016  Patient Care Team: Charolette Forward, MD as PCP - General (Cardiology) Physical medicine and rehabilitation: Sharlet Salina DO  CHIEF COMPLAINTS/PURPOSE OF CONSULTATION:  Follow-up for follicular lymphoma  INTERVAL HISTORY  Patient is here for f/u after her planned  5 cycles of BR. No overt new toxicities. PET/CT scan done after 5 cycles of BR on 09/06/2016 shows no evidence of residual metasbolically active disease or new disease. We discussed that she would be at higher risk of FL relapse and discussed pros vs cons of maintenance Rituxan. Given her continued smoking, goals of care and concerns with recurrent respiratory infections the patient made a decision to not pursue maintenance Rituxan and did not wish to do the 6th cycle of BR. Labs look stable today . She continues to smoke significantly and notes that she is trying to cut down. No fevers or chills no night sweats no abdominal pain.  MEDICAL HISTORY:  Past Medical History:  Diagnosis Date  . A-fib ([place])   . Arthritis   . Chronic back pain   . COPD (chronic obstructive pulmonary disease) ([place])   . Coronary artery disease   . Glaucoma   . Hearing aid worn   . High cholesterol   . Hypertension   . Lymphadenopathy 01/25/2016  . Myocardial infarction ([place])    2000  . Non Hodgkin's lymphoma ([place])   . Wears dentures    " Top plate"  . Wears glasses   #1 bilateral cataracts #2 COPD  #3 active smoker half a pack per day for about 60 years #4 degenerative arthritis - has had chronic back pain issues with multiple epidural steroid injections from 2014 [5 times] #5 glaucoma #6 degenerative arthritis #7 history of TIA #8 dyslipidemia #9 hypertension #10 coronary artery disease with inferior wall MI in the past, status post PCI to RCA in 2001 #11 hypothyroidism #12 lumbar radiculopathy/neuropathy. #13 paroxysmal atrial fibrillation on anticoagulation  with Eliquis. #14 type 2 diabetes #16 obesity  SURGICAL HISTORY:  #1 cardiac catheterization #2 cholecystectomy #3 colonoscopy #4 wisdom teeth extraction.  SOCIAL HISTORY: Currently single. Notes that she lost her female partner 9 months ago. Active smoker half pack per day since her teens  FAMILY HISTORY: Patient reports no history of cancers or blood disorders in the family that she is aware of.  ALLERGIES:  is allergic to hydrocodone and oxycodone.  MEDICATIONS:  Current Outpatient Prescriptions  Medication Sig Dispense Refill  . ALPRAZolam (XANAX) 0.25 MG tablet Take 0.25 mg by mouth daily as needed for anxiety.     Marland Kitchen amiodarone (PACERONE) 200 MG tablet Take 100 mg by mouth every morning.    Marland Kitchen atorvastatin (LIPITOR) 40 MG tablet Take 40 mg by mouth every morning.     . Bimatoprost (LUMIGAN OP) Place 1 drop into both eyes at bedtime.    Marland Kitchen dexamethasone (DECADRON) 4 MG tablet Take 2 tablets (8 mg total) by mouth daily. Start the day after bendamustine chemotherapy for 2 days. Take with food. 30 tablet 1  . doxycycline (VIBRA-TABS) 100 MG tablet Take 1 tablet (100 mg total) by mouth 2 (two) times daily. 14 tablet 0  . ELIQUIS 5 MG TABS tablet Take 5 mg by mouth 2 (two) times daily.    Marland Kitchen gabapentin (NEURONTIN) 300 MG capsule Take 300 mg by mouth 2 (two) times daily.    . Hypromellose (ARTIFICIAL TEARS OP) Place 1 drop into both eyes daily as needed. Dry  eyes    . losartan (COZAAR) 50 MG tablet Take 50 mg by mouth every morning.    . Melatonin 5 MG TABS Take 5 mg by mouth at bedtime.    . meloxicam (MOBIC) 7.5 MG tablet Take 7.5 mg by mouth at bedtime.    . metoprolol succinate (TOPROL-XL) 50 MG 24 hr tablet Take 50 mg by mouth daily.    . nitroGLYCERIN (NITROSTAT) 0.4 MG SL tablet Place 0.4 mg under the tongue every 5 (five) minutes x 3 doses as needed for chest pain.     Marland Kitchen ondansetron (ZOFRAN) 8 MG tablet Take 1 tablet (8 mg total) by mouth 2 (two) times daily as needed for  refractory nausea / vomiting. Start on day 2 after bendamustine chemo 30 tablet 1  . potassium chloride SA (K-DUR,KLOR-CON) 20 MEQ tablet Take 1 tablet (20 mEq total) by mouth daily. 14 tablet 0  . prochlorperazine (COMPAZINE) 10 MG tablet Take 1 tablet (10 mg total) by mouth every 6 (six) hours as needed for nausea or vomiting. 30 tablet 0  . traMADol (ULTRAM) 50 MG tablet Take 1-2 tablets (50-100 mg total) by mouth every 6 (six) hours as needed. 30 tablet 1   No current facility-administered medications for this visit.     REVIEW OF SYSTEMS:    10 Point review of Systems was done is negative except as noted above.  PHYSICAL EXAMINATION: ECOG PERFORMANCE STATUS: 1 - Symptomatic but completely ambulatory Vital signs recorded in the EMR, reviewed.  GENERAL:alert, NAD but anxious due to her new diagnosis. SKIN: some indurated at the site of the rt inguinal LN biopsy with some serous drainage. EYES: normal, conjunctiva are pink and non-injected, sclera clear OROPHARYNX:no exudate, no erythema and lips, buccal mucosa, and tongue normal  NECK: supple, no JVD, thyroid normal size, non-tender, without nodularity LYMPH:  no palpable lymphadenopathy in the cervical, axillary or inguinal area LUNGS: clear to auscultation with normal respiratory effort HEART: regular rate & rhythm,  no murmurs and trace lower extremity edema ABDOMEN: abdomen soft, non-tender, normoactive bowel sounds, no palpable hepatosplenomegaly  Musculoskeletal: no cyanosis of digits and no clubbing  PSYCH: alert & oriented x 3 with fluent speech NEURO: no focal motor/sensory deficits  LABORATORY DATA:  I have reviewed the data as listed  . CBC Latest Ref Rng & Units 09/10/2016 07/30/2016 07/02/2016  WBC 3.9 - 10.3 10e3/uL 6.1 3.8(L) 6.2  Hemoglobin 11.6 - 15.9 g/dL 12.6 11.6 12.5  Hematocrit 34.8 - 46.6 % 38.2 35.1 37.4  Platelets 145 - 400 10e3/uL 140(L) 144(L) 149   . CBC    Component Value Date/Time   WBC 6.1  09/10/2016 0923   WBC 9.8 01/23/2016 1023   RBC 3.94 09/10/2016 0923   RBC 4.26 01/23/2016 1023   HGB 12.6 09/10/2016 0923   HCT 38.2 09/10/2016 0923   PLT 140 (L) 09/10/2016 0923   MCV 97.0 09/10/2016 0923   MCH 32.0 09/10/2016 0923   MCH 31.0 01/23/2016 1023   MCHC 33.0 09/10/2016 0923   MCHC 31.7 01/23/2016 1023   RDW 12.9 09/10/2016 0923   LYMPHSABS 1.8 09/10/2016 0923   MONOABS 0.7 09/10/2016 0923   EOSABS 0.2 09/10/2016 0923   BASOSABS 0.0 09/10/2016 0923    . CMP Latest Ref Rng & Units 09/10/2016 07/30/2016 07/02/2016  Glucose 70 - 140 mg/dl 104 102 102  BUN 7.0 - 26.0 mg/dL 12.3 7.5 6.7(L)  Creatinine 0.6 - 1.1 mg/dL 0.8 0.7 0.8  Sodium 136 - 145 mEq/L  141 143 142  Potassium 3.5 - 5.1 mEq/L 4.3 3.2(L) 3.7  Chloride 101 - 111 mmol/L - - -  CO2 22 - 29 mEq/L 27 29 27   Calcium 8.4 - 10.4 mg/dL 9.2 8.9 9.2  Total Protein 6.4 - 8.3 g/dL 6.5 6.6 6.7  Total Bilirubin 0.20 - 1.20 mg/dL 0.45 0.49 0.53  Alkaline Phos 40 - 150 U/L 58 62 56  AST 5 - 34 U/L 18 24 20   ALT 0 - 55 U/L 12 14 12    .          RADIOGRAPHIC STUDIES: I have personally reviewed the radiological images as listed and agreed with the findings in the report. Nm Pet Image Restag (ps) Skull Base To Thigh  Result Date: 09/06/2016 CLINICAL DATA:  Subsequent treatment strategy for lymphoma. EXAM: NUCLEAR MEDICINE PET SKULL BASE TO THIGH TECHNIQUE: 10.4 mCi F-18 FDG was injected intravenously. Full-ring PET imaging was performed from the skull base to thigh after the radiotracer. CT data was obtained and used for attenuation correction and anatomic localization. FASTING BLOOD GLUCOSE:  Value: 115 mg/dl COMPARISON:  06/28/2016 FINDINGS: NECK No hypermetabolic lymph nodes in the neck. CHEST The heart size is normal. Aortic atherosclerosis noted. Calcification in the LAD and RCA coronary arteries noted. No hypermetabolic mediastinal or hilar lymph nodes identified. No hypermetabolic axillary or supraclavicular  lymph nodes. There is a ground-glass nodule within the right lower lobe which appears new from previous exam measuring 1.2 cm. No significant FDG uptake associated with this nodule which has an SUV max of 1.1. ABDOMEN/PELVIS The retroperitoneal soft tissue is again identified measuring 4.8 x 3.0 cm and has an SUV max equal to 3.36. On the previous exam this measured 5.3 x 3.8 cm and had an SUV max equal to 3.27. No new areas of hypermetabolism identified to suggest progression of disease. Non hypermetabolic flu collection within the right inguinal region is again noted measuring 3.3 cm, image 197 of series 4. This is unchanged from previous exam. SKELETON No focal hypermetabolic activity to suggest skeletal metastasis. IMPRESSION: 1. Stable appearance of retroperitoneal soft tissue with low-level hypermetabolism when compared with 06/28/2016. No new areas of abnormal hypermetabolism identified. Imaging findings are favored to represent treated tumor. No new sites of disease identified. 2. No change in fluid collection within the right inguinal region measuring 3.3 cm. No significant FDG uptake associated with this structure. 3. There is a sub solid nodule within the right lower lobe measuring 1.2 cm. Indeterminate. Initial follow-up with CT at 6-12 months is recommended to confirm persistence. If persistent, repeat CT is recommended every 2 years until 5 years of stability has been established. This recommendation follows the consensus statement: Guidelines for Management of Incidental Pulmonary Nodules Detected on CT Images: From the Fleischner Society 2017; Radiology 2017; 284:228-243. Electronically Signed   By: Kerby Moors M.D.   On: 09/06/2016 16:53    PET/CT 12/22/2015  IMPRESSION: 1. Hypermetabolic confluent lower retroperitoneal / bilateral common iliac, right external iliac and right inguinal lymphadenopathy, consistent with lymphoma. 2. Solitary focus of osseous hypermetabolism in the left  upper sacrum consistent with osseous involvement by lymphoma. 3. No metabolically active lymphoma in the neck or chest. Spleen is normal in size and metabolism. 4. Additional findings include aortic atherosclerosis, three-vessel coronary atherosclerosis and mild sigmoid diverticulosis.   Electronically Signed   By: Ilona Sorrel M.D.   On: 12/22/2015 17:21   ASSESSMENT & PLAN:   76 year old Caucasian female with multiple chronic medical comorbidities  as noted above with   #1 Stage IVB Low grade Follicular Lymphoma with extensive retroperitoneal lymphadenopathy and right inguinal lymphadenopathy as well as osseous involvement of the sacrum. LDH level within normal limits.  S/p 5 cycles of BR (Bendamustine 70mg /m2)  PET CT scan after 5 cycles of Bendamustine and Rituxan done on 09/06/2016 shows no residual metabolic active disease and no evidence of disease progression.  #2 Rt inguinal LN biopsy site infection and seroma -now resolved Plan -discussed the PET/CT results and reviewed images with the patient. - patient has no clinical or radiographic evidence of NHL/FL porgression at this time. -discussed pro's vs con's of maintenance Rituxan - patient declined this option. -infection prevention strategies. -counseled on smoking cessation -patient still somewhat resistant to cut down but sounds like she is a little more open to the idea. -educated on concerning progression of lymphoma progression to monitor for.  RTC with Dr Irene Limbo in 3 months with labs  All of the patients , her friends and family members questions were answered to their apparent satisfaction. The patient knows to call the clinic with any problems, questions or concerns.  I spent 20 minutes counseling the patient face to face. The total time spent in the appointment was 25 minutes and more than 50% was on counseling and direct patient cares.    Sullivan Lone MD [place] AAHIVMS [facility] Hematology/Oncology Physician [facility]  (Office):       [phone] (Work cell):  [phone] (Fax):           [phone]

## 2016-09-22 ENCOUNTER — Other Ambulatory Visit: Payer: Self-pay | Admitting: Hematology

## 2016-09-22 DIAGNOSIS — C8298 Follicular lymphoma, unspecified, lymph nodes of multiple sites: Secondary | ICD-10-CM

## 2016-09-25 ENCOUNTER — Telehealth: Payer: Self-pay

## 2016-09-25 ENCOUNTER — Other Ambulatory Visit: Payer: Self-pay

## 2016-09-25 NOTE — Telephone Encounter (Signed)
Left message with [facility] on [address] that Dr. Irene Limbo has discontinued the order for acyclovir 400mg  tablet.

## 2016-12-06 NOTE — Progress Notes (Signed)
HEMATOLOGY/ONCOLOGY CLINIC NOTE  Date of Service: .12/11/2016  Patient Care Team: Charolette Forward, MD as PCP - General (Cardiology) Physical medicine and rehabilitation: Sharlet Salina DO  CHIEF COMPLAINTS/PURPOSE OF CONSULTATION:  Follow-up for follicular lymphoma  INTERVAL HISTORY  Patient is here for f/u of her follicular lymphoma. She has not received her flu shot and has been experiencing cough occasionally with greenish colored phlegm which is on and off. She has been taking mucinex and cough syrup to help.  Labs look stable today . She continues to smoke significantly and notes that she is trying to cut down. I discussed smoking puts her at higher risk of bronchitis No fevers or chills or no abdominal pain. She reports to having night sweats for years.  MEDICAL HISTORY:  Past Medical History:  Diagnosis Date  . A-fib ([place])   . Arthritis   . Chronic back pain   . COPD (chronic obstructive pulmonary disease) ([place])   . Coronary artery disease   . Glaucoma   . Hearing aid worn   . High cholesterol   . Hypertension   . Lymphadenopathy 01/25/2016  . Myocardial infarction ([place])    2000  . Non Hodgkin's lymphoma ([place])   . Wears dentures    " Top plate"  . Wears glasses   #1 bilateral cataracts #2 COPD  #3 active smoker half a pack per day for about 60 years #4 degenerative arthritis - has had chronic back pain issues with multiple epidural steroid injections from 2014 [5 times] #5 glaucoma #6 degenerative arthritis #7 history of TIA #8 dyslipidemia #9 hypertension #10 coronary artery disease with inferior wall MI in the past, status post PCI to RCA in 2001 #11 hypothyroidism #12 lumbar radiculopathy/neuropathy. #13 paroxysmal atrial fibrillation on anticoagulation with Eliquis. #14 type 2 diabetes #16 obesity  SURGICAL HISTORY:  #1 cardiac catheterization #2 cholecystectomy #3 colonoscopy #4 wisdom teeth extraction.  SOCIAL HISTORY: Currently  single. Notes that she lost her female partner 9 months ago. Active smoker half pack per day since her teens  FAMILY HISTORY: Patient reports no history of cancers or blood disorders in the family that she is aware of.  ALLERGIES:  is allergic to hydrocodone and oxycodone.  MEDICATIONS:  Current Outpatient Prescriptions  Medication Sig Dispense Refill  . ALPRAZolam (XANAX) 0.25 MG tablet Take 0.25 mg by mouth daily as needed for anxiety.     Marland Kitchen amiodarone (PACERONE) 200 MG tablet Take 100 mg by mouth every morning.    Marland Kitchen atorvastatin (LIPITOR) 40 MG tablet Take 40 mg by mouth every morning.     . Bimatoprost (LUMIGAN OP) Place 1 drop into both eyes at bedtime.    Marland Kitchen dexamethasone (DECADRON) 4 MG tablet Take 2 tablets (8 mg total) by mouth daily. Start the day after bendamustine chemotherapy for 2 days. Take with food. 30 tablet 1  . doxycycline (VIBRA-TABS) 100 MG tablet Take 1 tablet (100 mg total) by mouth 2 (two) times daily. 14 tablet 0  . ELIQUIS 5 MG TABS tablet Take 5 mg by mouth 2 (two) times daily.    Marland Kitchen gabapentin (NEURONTIN) 300 MG capsule Take 300 mg by mouth 2 (two) times daily.    . Hypromellose (ARTIFICIAL TEARS OP) Place 1 drop into both eyes daily as needed. Dry eyes    . losartan (COZAAR) 50 MG tablet Take 50 mg by mouth every morning.    . Melatonin 5 MG TABS Take 5 mg by mouth at bedtime.    Marland Kitchen  meloxicam (MOBIC) 7.5 MG tablet Take 7.5 mg by mouth at bedtime.    . metoprolol succinate (TOPROL-XL) 50 MG 24 hr tablet Take 50 mg by mouth daily.    . nitroGLYCERIN (NITROSTAT) 0.4 MG SL tablet Place 0.4 mg under the tongue every 5 (five) minutes x 3 doses as needed for chest pain.     Marland Kitchen ondansetron (ZOFRAN) 8 MG tablet Take 1 tablet (8 mg total) by mouth 2 (two) times daily as needed for refractory nausea / vomiting. Start on day 2 after bendamustine chemo 30 tablet 1  . potassium chloride SA (K-DUR,KLOR-CON) 20 MEQ tablet Take 1 tablet (20 mEq total) by mouth daily. 14 tablet 0    . prochlorperazine (COMPAZINE) 10 MG tablet Take 1 tablet (10 mg total) by mouth every 6 (six) hours as needed for nausea or vomiting. 30 tablet 0  . traMADol (ULTRAM) 50 MG tablet Take 1-2 tablets (50-100 mg total) by mouth every 6 (six) hours as needed. 30 tablet 1   No current facility-administered medications for this visit.     REVIEW OF SYSTEMS:    10 Point review of Systems was done is negative except as noted above.  PHYSICAL EXAMINATION:  ECOG PERFORMANCE STATUS: 1 - Symptomatic but completely ambulatory Vital signs recorded in the EMR, reviewed.  GENERAL:alert, NAD but anxious due to her new diagnosis. SKIN: some indurated at the site of the rt inguinal LN biopsy with some serous drainage. EYES: normal, conjunctiva are pink and non-injected, sclera clear OROPHARYNX:no exudate, no erythema and lips, buccal mucosa, and tongue normal  NECK: supple, no JVD, thyroid normal size, non-tender, without nodularity LYMPH:  no palpable lymphadenopathy in the cervical, axillary or inguinal area LUNGS: clear to auscultation with normal respiratory effort HEART: regular rate & rhythm,  no murmurs and trace lower extremity edema ABDOMEN: abdomen soft, non-tender, normoactive bowel sounds, no palpable hepatosplenomegaly  Musculoskeletal: no cyanosis of digits and no clubbing  PSYCH: alert & oriented x 3 with fluent speech NEURO: no focal motor/sensory deficits  LABORATORY DATA:  I have reviewed the data as listed  . CBC Latest Ref Rng & Units 12/11/2016 09/10/2016 07/30/2016  WBC 3.9 - 10.3 10e3/uL 5.7 6.1 3.8(L)  Hemoglobin 11.6 - 15.9 g/dL 13.2 12.6 11.6  Hematocrit 34.8 - 46.6 % 40.5 38.2 35.1  Platelets 145 - 400 10e3/uL 160 140(L) 144(L)   . CBC    Component Value Date/Time   WBC 5.7 12/11/2016 1001   WBC 9.8 01/23/2016 1023   RBC 4.12 12/11/2016 1001   RBC 4.26 01/23/2016 1023   HGB 13.2 12/11/2016 1001   HCT 40.5 12/11/2016 1001   PLT 160 12/11/2016 1001   MCV 98.3  12/11/2016 1001   MCH 32.0 12/11/2016 1001   MCH 31.0 01/23/2016 1023   MCHC 32.6 12/11/2016 1001   MCHC 31.7 01/23/2016 1023   RDW 13.1 12/11/2016 1001   LYMPHSABS 1.6 12/11/2016 1001   MONOABS 0.6 12/11/2016 1001   EOSABS 0.2 12/11/2016 1001   BASOSABS 0.0 12/11/2016 1001    . CMP Latest Ref Rng & Units 12/11/2016 09/10/2016 07/30/2016  Glucose 70 - 140 mg/dl 104 104 102  BUN 7.0 - 26.0 mg/dL 9.3 12.3 7.5  Creatinine 0.6 - 1.1 mg/dL 0.8 0.8 0.7  Sodium 136 - 145 mEq/L 143 141 143  Potassium 3.5 - 5.1 mEq/L 4.0 4.3 3.2(L)  Chloride 101 - 111 mmol/L - - -  CO2 22 - 29 mEq/L 29 27 29   Calcium 8.4 - 10.4  mg/dL 9.2 9.2 8.9  Total Protein 6.4 - 8.3 g/dL 6.6 6.5 6.6  Total Bilirubin 0.20 - 1.20 mg/dL 0.43 0.45 0.49  Alkaline Phos 40 - 150 U/L 66 58 62  AST 5 - 34 U/L 17 18 24   ALT 0 - 55 U/L 11 12 14    . Lab Results  Component Value Date   LDH 198 12/11/2016    .          RADIOGRAPHIC STUDIES: I have personally reviewed the radiological images as listed and agreed with the findings in the report. No results found.  PET/CT 12/22/2015  IMPRESSION: 1. Hypermetabolic confluent lower retroperitoneal / bilateral common iliac, right external iliac and right inguinal lymphadenopathy, consistent with lymphoma. 2. Solitary focus of osseous hypermetabolism in the left upper sacrum consistent with osseous involvement by lymphoma. 3. No metabolically active lymphoma in the neck or chest. Spleen is normal in size and metabolism. 4. Additional findings include aortic atherosclerosis, three-vessel coronary atherosclerosis and mild sigmoid diverticulosis.   Electronically Signed   By: Ilona Sorrel M.D.   On: 12/22/2015 17:21   ASSESSMENT & PLAN:   76 year old Caucasian female with multiple chronic medical comorbidities as noted above with   #1 Stage IVB Low grade Follicular Lymphoma with extensive retroperitoneal lymphadenopathy and right inguinal lymphadenopathy as  well as osseous involvement of the sacrum. LDH level within normal limits.  S/p 5 cycles of BR (Bendamustine 70mg /m2)  PET CT scan after 5 cycles of Bendamustine and Rituxan done on 09/06/2016 shows no residual metabolic active disease and no evidence of disease progression.  #2 Rt inguinal LN biopsy site infection and seroma -now resolved Plan - patient has no clinical or lab evidence of NHL/FL progression at this time. -previously discussed pro's vs con's of maintenance Rituxan - patient declined this option. -infection prevention strategies. -counseled on smoking cessation -patient still somewhat resistant to cut down but sounds like she is a little more open to the idea. -educated on concerning symptoms of lymphoma progression to monitor for.  #3 Acute bronchitis -DOxycycline x 7 days -smoking cessation.   RTC with Dr Irene Limbo in 3 months with labs    All of the patients , her friends and family members questions were answered to their apparent satisfaction. The patient knows to call the clinic with any problems, questions or concerns.  I spent 20 minutes counseling the patient face to face. The total time spent in the appointment was 25 minutes and more than 50% was on counseling and direct patient cares.   This document serves as a record of services personally performed by Sullivan Lone, MD. It was created on her behalf by Brandt Loosen, a trained medical scribe. The creation of this record is based on the scribe's personal observations and the provider's statements to them. This document has been checked and approved by the attending provider.  Sullivan Lone MD [place] AAHIVMS [facility] Hematology/Oncology Physician [facility]  (Office):       [phone] (Work cell):  [phone] (Fax):           [phone]

## 2016-12-11 ENCOUNTER — Ambulatory Visit (HOSPITAL_BASED_OUTPATIENT_CLINIC_OR_DEPARTMENT_OTHER): Payer: Medicare Other | Admitting: Hematology

## 2016-12-11 ENCOUNTER — Telehealth: Payer: Self-pay | Admitting: Hematology

## 2016-12-11 ENCOUNTER — Other Ambulatory Visit (HOSPITAL_BASED_OUTPATIENT_CLINIC_OR_DEPARTMENT_OTHER): Payer: Medicare Other

## 2016-12-11 VITALS — BP 174/70 | HR 64 | Temp 98.8°F | Resp 20 | Ht 70.0 in | Wt 205.5 lb

## 2016-12-11 DIAGNOSIS — C8298 Follicular lymphoma, unspecified, lymph nodes of multiple sites: Secondary | ICD-10-CM

## 2016-12-11 DIAGNOSIS — F17200 Nicotine dependence, unspecified, uncomplicated: Secondary | ICD-10-CM | POA: Diagnosis not present

## 2016-12-11 DIAGNOSIS — J40 Bronchitis, not specified as acute or chronic: Secondary | ICD-10-CM

## 2016-12-11 DIAGNOSIS — Z8572 Personal history of non-Hodgkin lymphomas: Secondary | ICD-10-CM

## 2016-12-11 LAB — COMPREHENSIVE METABOLIC PANEL
ALT: 11 U/L (ref 0–55)
ANION GAP: 8 meq/L (ref 3–11)
AST: 17 U/L (ref 5–34)
Albumin: 3.4 g/dL — ABNORMAL LOW (ref 3.5–5.0)
Alkaline Phosphatase: 66 U/L (ref 40–150)
BILIRUBIN TOTAL: 0.43 mg/dL (ref 0.20–1.20)
BUN: 9.3 mg/dL (ref 7.0–26.0)
CO2: 29 meq/L (ref 22–29)
Calcium: 9.2 mg/dL (ref 8.4–10.4)
Chloride: 106 mEq/L (ref 98–109)
Creatinine: 0.8 mg/dL (ref 0.6–1.1)
EGFR: 71 mL/min/{1.73_m2} — AB (ref >90)
Glucose: 104 mg/dl (ref 70–140)
POTASSIUM: 4 meq/L (ref 3.5–5.1)
Sodium: 143 mEq/L (ref 136–145)
TOTAL PROTEIN: 6.6 g/dL (ref 6.4–8.3)

## 2016-12-11 LAB — CBC & DIFF AND RETIC
BASO%: 0.5 % (ref 0.0–2.0)
Basophils Absolute: 0 10*3/uL (ref 0.0–0.1)
EOS%: 2.6 % (ref 0.0–7.0)
Eosinophils Absolute: 0.2 10*3/uL (ref 0.0–0.5)
HCT: 40.5 % (ref 34.8–46.6)
HGB: 13.2 g/dL (ref 11.6–15.9)
Immature Retic Fract: 5.8 % (ref 1.60–10.00)
LYMPH%: 27.9 % (ref 14.0–49.7)
MCH: 32 pg (ref 25.1–34.0)
MCHC: 32.6 g/dL (ref 31.5–36.0)
MCV: 98.3 fL (ref 79.5–101.0)
MONO#: 0.6 10*3/uL (ref 0.1–0.9)
MONO%: 10.3 % (ref 0.0–14.0)
NEUT%: 58.7 % (ref 38.4–76.8)
NEUTROS ABS: 3.4 10*3/uL (ref 1.5–6.5)
PLATELETS: 160 10*3/uL (ref 145–400)
RBC: 4.12 10*6/uL (ref 3.70–5.45)
RDW: 13.1 % (ref 11.2–14.5)
Retic %: 1.24 % (ref 0.70–2.10)
Retic Ct Abs: 51.09 10*3/uL (ref 33.70–90.70)
WBC: 5.7 10*3/uL (ref 3.9–10.3)
lymph#: 1.6 10*3/uL (ref 0.9–3.3)

## 2016-12-11 LAB — LACTATE DEHYDROGENASE: LDH: 198 U/L (ref 125–245)

## 2016-12-11 MED ORDER — DOXYCYCLINE HYCLATE 100 MG PO TABS: 100.0000 mg | ORAL_TABLET | Freq: Two times a day (BID) | ORAL | 0 refills | Status: DC

## 2016-12-11 NOTE — Patient Instructions (Signed)
RTC with Dr. Irene Limbo in 3 months with labs I will prescribe Azithromycin antibiotics for patient today

## 2016-12-11 NOTE — Telephone Encounter (Signed)
Gave avs and calendar for December  °

## 2016-12-25 ENCOUNTER — Encounter (HOSPITAL_COMMUNITY): Payer: Self-pay | Admitting: Emergency Medicine

## 2016-12-25 ENCOUNTER — Emergency Department (HOSPITAL_COMMUNITY)
Admission: EM | Admit: 2016-12-25 | Discharge: 2016-12-25 | Disposition: A | Discharge status: Home / Self Care | Payer: Medicare Other | Attending: Emergency Medicine | Admitting: Emergency Medicine

## 2016-12-25 ENCOUNTER — Emergency Department (HOSPITAL_COMMUNITY): Payer: Medicare Other

## 2016-12-25 DIAGNOSIS — W109XXA Fall (on) (from) unspecified stairs and steps, initial encounter: Secondary | ICD-10-CM | POA: Insufficient documentation

## 2016-12-25 DIAGNOSIS — I4891 Unspecified atrial fibrillation: Secondary | ICD-10-CM | POA: Diagnosis not present

## 2016-12-25 DIAGNOSIS — Y92009 Unspecified place in unspecified non-institutional (private) residence as the place of occurrence of the external cause: Secondary | ICD-10-CM | POA: Diagnosis not present

## 2016-12-25 DIAGNOSIS — Z7901 Long term (current) use of anticoagulants: Secondary | ICD-10-CM | POA: Diagnosis not present

## 2016-12-25 DIAGNOSIS — S81811A Laceration without foreign body, right lower leg, initial encounter: Secondary | ICD-10-CM | POA: Diagnosis present

## 2016-12-25 DIAGNOSIS — J449 Chronic obstructive pulmonary disease, unspecified: Secondary | ICD-10-CM | POA: Insufficient documentation

## 2016-12-25 DIAGNOSIS — I252 Old myocardial infarction: Secondary | ICD-10-CM | POA: Insufficient documentation

## 2016-12-25 DIAGNOSIS — Y93K9 Activity, other involving animal care: Secondary | ICD-10-CM | POA: Insufficient documentation

## 2016-12-25 DIAGNOSIS — Y999 Unspecified external cause status: Secondary | ICD-10-CM | POA: Insufficient documentation

## 2016-12-25 DIAGNOSIS — Z79899 Other long term (current) drug therapy: Secondary | ICD-10-CM | POA: Diagnosis not present

## 2016-12-25 DIAGNOSIS — I10 Essential (primary) hypertension: Secondary | ICD-10-CM | POA: Insufficient documentation

## 2016-12-25 DIAGNOSIS — F17210 Nicotine dependence, cigarettes, uncomplicated: Secondary | ICD-10-CM | POA: Insufficient documentation

## 2016-12-25 MED ORDER — LIDOCAINE-EPINEPHRINE (PF) 2 %-1:200000 IJ SOLN
20.0000 mL | Freq: Once | INTRAMUSCULAR | Status: AC
  Administered 2016-12-25: 20 mL
  Filled 2016-12-25: qty 20

## 2016-12-25 MED ORDER — BACITRACIN ZINC 500 UNIT/GM EX OINT
TOPICAL_OINTMENT | CUTANEOUS | Status: AC
  Filled 2016-12-25: qty 1.8

## 2016-12-25 NOTE — ED Notes (Signed)
ED Provider at bedside. 

## 2016-12-25 NOTE — ED Notes (Signed)
Per MD request, bacitracin and dressing applied to wound.

## 2016-12-25 NOTE — Discharge Instructions (Signed)
Please see your PCP or return to ED/UC in 10 days for suture removal  Watch for signs of infection, and return without fail for worsening symptoms, including fever, pus drainage, escalating pain, worsening redness/swelling or any other symptoms concerning to you.

## 2016-12-25 NOTE — ED Notes (Signed)
Patient transported to X-ray 

## 2016-12-25 NOTE — ED Provider Notes (Signed)
[facility] DEPT Provider Note   CSN: [id number] Arrival date & time: 12/25/16  1033     History   Chief Complaint Chief Complaint  Patient presents with  . Fall  . Extremity Laceration    HPI Samantha [name] is a 76 y.o. female.  The history is provided by the patient.  Fall  This is a new problem. The current episode started less than 1 hour ago. The problem occurs rarely. The problem has been resolved. Pertinent negatives include no chest pain, no abdominal pain, no headaches and no shortness of breath. Nothing aggravates the symptoms. Nothing relieves the symptoms. She has tried nothing for the symptoms.   76 year old female who presents with right lower leg laceration. She has a history of atrial fibrillation on [place]. Reports that she was tangled up in her dog's leash while going down the steps, and fell forward onto her knees. She did not hit her head or have loss of consciousness. Did sustain abrasions to bilateral knees and large laceration to the right lower leg. Bleeding was controlled with pressure. She did not hit her head have loss of consciousness. Her tetanus is up-to-date within the last year. Denies any other injuries and denies any headaches, confusion, neck pain or back pain, abdominal pain, chest pain or difficulty breathing. Past Medical History:  Diagnosis Date  . A-fib ([place])   . Arthritis   . Chronic back pain   . COPD (chronic obstructive pulmonary disease) ([place])   . Coronary artery disease   . Glaucoma   . Hearing aid worn   . High cholesterol   . Hypertension   . Lymphadenopathy 01/25/2016  . Myocardial infarction ([place])    2000  . Non Hodgkin's lymphoma ([place])   . Wears dentures    " Top plate"  . Wears glasses     Patient Active Problem List   Diagnosis Date Noted  . Follicular lymphoma of lymph nodes of multiple regions ([place]) 01/30/2016  . Lymphadenopathy 01/25/2016    Past Surgical History:  Procedure Laterality Date  . CARDIAC  CATHETERIZATION    . CATARACT EXTRACTION W/ INTRAOCULAR LENS IMPLANT    . CHOLECYSTECTOMY    . COLONOSCOPY W/ BIOPSIES AND POLYPECTOMY    . CORONARY STENT PLACEMENT    . INGUINAL LYMPH NODE BIOPSY Right 01/25/2016   Procedure: EXCISION DEEP RIGHT INGUINAL LYMPH NODE BIOPSY WITH ULTRA SOUND;  Surgeon: Fanny Skates, MD;  Location: [facility];  Service: General;  Laterality: Right;    OB History    No data available       Home Medications    Prior to Admission medications   Medication Sig Start Date End Date Taking? Authorizing Provider  ALPRAZolam [name]) 0.25 MG tablet Take 0.25 mg by mouth daily as needed for anxiety.    Yes [provider]  amiodarone (PACERONE) 200 MG tablet Take 100 mg by mouth every morning. 08/24/15  Yes [provider]  atorvastatin (LIPITOR) 40 MG tablet Take 40 mg by mouth every morning.  08/24/15  Yes [provider]  Bimatoprost (LUMIGAN OP) Place 1 drop into both eyes at bedtime.   Yes [provider]  doxycycline (VIBRA-TABS) 100 MG tablet Take 1 tablet (100 mg total) by mouth 2 (two) times daily. 12/11/16  Yes Kale, Cloria [place], MD  ELIQUIS 5 MG TABS tablet Take 5 mg by mouth 2 (two) times daily. 08/24/15  Yes [provider]  gabapentin (NEURONTIN) 300 MG capsule Take 300 mg by  mouth 2 (two) times daily. 08/24/15  Yes [provider]  losartan (COZAAR) 50 MG tablet Take 50 mg by mouth every morning. 04/28/14  Yes [provider]  Melatonin 5 MG TABS Take 5 mg by mouth at bedtime.   Yes [provider]  metoprolol succinate (TOPROL-XL) 50 MG 24 hr tablet Take 50 mg by mouth daily. 08/24/15  Yes [provider]  nitroGLYCERIN (NITROSTAT) 0.4 MG SL tablet Place 0.4 mg under the tongue every 5 (five) minutes x 3 doses as needed for chest pain.    Yes [provider]  timolol (TIMOPTIC) 0.5 % ophthalmic solution Place 1 drop into both eyes. Instill 1 drop in each eye in the morning and  one drop in each eye at night 11/28/16  Yes [provider]  traMADol (ULTRAM) 50 MG tablet Take 1-2 tablets (50-100 mg total) by mouth every 6 (six) hours as needed. 01/25/16  Yes Fanny Skates, MD  dexamethasone (DECADRON) 4 MG tablet Take 2 tablets (8 mg total) by mouth daily. Start the day after bendamustine chemotherapy for 2 days. Take with food. Patient not taking: Reported on 12/25/2016 04/09/16   Brunetta Genera, MD  ondansetron (ZOFRAN) 8 MG tablet Take 1 tablet (8 mg total) by mouth 2 (two) times daily as needed for refractory nausea / vomiting. Start on day 2 after bendamustine chemo Patient not taking: Reported on 12/25/2016 04/09/16   Brunetta Genera, MD  potassium chloride SA (K-DUR,KLOR-CON) 20 MEQ tablet Take 1 tablet (20 mEq total) by mouth daily. Patient not taking: Reported on 12/25/2016 07/30/16   Brunetta Genera, MD  prochlorperazine (COMPAZINE) 10 MG tablet Take 1 tablet (10 mg total) by mouth every 6 (six) hours as needed for nausea or vomiting. Patient not taking: Reported on 12/25/2016 04/10/16   Brunetta Genera, MD    Family History Family History  Problem Relation Age of Onset  . Hypertension Mother   . Stroke Mother   . Heart disease Father   . Heart disease Other     Social History Social History  Substance Use Topics  . Smoking status: Current Every Day Smoker    Packs/day: 0.50    Types: Cigarettes  . Smokeless tobacco: Never Used  . Alcohol use Yes     Comment: social     Allergies   Hydrocodone and Oxycodone   Review of Systems Review of Systems  Respiratory: Negative for shortness of breath.   Cardiovascular: Negative for chest pain.  Gastrointestinal: Negative for abdominal pain.  Skin: Positive for wound.  Neurological: Negative for headaches.  Hematological: Bruises/bleeds easily.  All other systems reviewed and are negative.    Physical Exam Updated Vital Signs BP (!) 154/68   Pulse (!) 56   Temp 98.1 F  (36.7 C)   Resp 18   SpO2 98%   Physical Exam Physical Exam  Nursing note and vitals reviewed. Constitutional: Well developed, well nourished, non-toxic, and in no acute distress Head: Normocephalic and atraumatic.  Mouth/Throat: Oropharynx is clear and moist.  Neck: Normal range of motion. Neck supple.  Cardiovascular: Normal rate and regular rhythm.   Pulmonary/Chest: Effort normal and breath sounds normal.  no chest wall tenderness Abdominal: Soft. There is no tenderness. There is no rebound and no guarding.  Musculoskeletal: Normal range of motion of all 4 extremities. Bilateral knee abrasions. 8 cm linear laceration to anterior right lower leg. No bleeding. No foreign body.  no CT LS-spine tenderness  Neurological: Alert,  no facial droop, fluent speech, moves all extremities symmetrically, sensation to light touch intact throughout, equal bilateral ankle dorsiflexion/plantar flexion, pupils equal and reactive to light Skin: Skin is warm and dry.  Psychiatric: Cooperative   ED Treatments / Results  Labs (all labs ordered are listed, but only abnormal results are displayed) Labs Reviewed - No data to display  EKG  EKG Interpretation None       Radiology Dg Tibia/fibula Right  Result Date: 12/25/2016 CLINICAL DATA:  Patient fell this morning after tripping over dog striking the left right lower leg. EXAM: RIGHT TIBIA AND FIBULA - 2 VIEW COMPARISON:  None in PACs FINDINGS: The right tibia and fibula are subjectively adequately mineralized. There is no acute fracture nor dislocation. There is soft tissue swelling and heterogeneous density over the lower pretibial region. No radiopaque foreign bodies are observed. The observed portions of the knee and ankle exhibit no acute abnormalities. IMPRESSION: No acute fracture nor dislocation of the right tibia or fibula. Soft tissue injury over the lower pretibial region. Electronically Signed   By: David  Martinique M.D.   On: 12/25/2016  12:23    Procedures .Marland KitchenLaceration Repair Date/Time: 12/25/2016 1:17 PM Performed by: Brantley Stage DUO Authorized by: Brantley Stage DUO   Consent:    Consent obtained:  Verbal   Consent given by:  Patient   Risks discussed:  Infection, pain and poor cosmetic result   Alternatives discussed:  No treatment Anesthesia (see MAR for exact dosages):    Anesthesia method:  Local infiltration   Local anesthetic:  Lidocaine 2% w/o epi Laceration details:    Location: right lower leg.   Length (cm):  8 Repair type:    Repair type:  Complex Pre-procedure details:    Preparation:  Patient was prepped and draped in usual sterile fashion Exploration:    Limited defect created (wound extended): no     Hemostasis achieved with:  Direct pressure   Wound exploration: entire depth of wound probed and visualized     Contaminated: no   Treatment:    Area cleansed with:  Betadine   Amount of cleaning:  Standard   Irrigation solution:  Sterile water and tap water   Irrigation volume:  1000 mL   Irrigation method:  Syringe   Debridement:  Moderate   Undermining:  None   Scar revision: no   Skin repair:    Repair method:  Sutures   Suture size:  4-0   Suture material:  Nylon   Number of sutures:  11 Approximation:    Approximation:  Close Post-procedure details:    Dressing:  Antibiotic ointment and non-adherent dressing   Patient tolerance of procedure:  Tolerated well, no immediate complications   (including critical care time)  Medications Ordered in ED Medications  lidocaine-EPINEPHrine (XYLOCAINE W/EPI) 2 %-1:200000 (PF) injection 20 mL (20 mLs Infiltration Given by Other 12/25/16 1206)     Initial Impression / Assessment and Plan / ED Course  I have reviewed the triage vital signs and the nursing notes.  Pertinent labs & imaging results that were available during my care of the patient were reviewed by me and considered in my medical decision making (see chart for details).      Laceration repaired. X-ray visualized and w/o fracture or antibiotics. Tetanus UTD.  No signs of head injury or other serious injury. Discussed wound care instructions.  Final Clinical Impressions(s) / ED Diagnoses   Final diagnoses:  Laceration of right lower extremity, initial encounter  New Prescriptions New Prescriptions   No medications on file     Forde Dandy, MD 12/25/16 1319

## 2016-12-25 NOTE — ED Triage Notes (Signed)
Per EMS- Patient states she was walking down the steps at home when she was tripped by her dog. Patient fell and has a 6-8 inch laceration to the right leg. Wound care completed by EMS. Patient had No LOC nor did she hit her head. patient does take Eliquis.

## 2016-12-25 NOTE — ED Triage Notes (Signed)
Pt stated that she was walking down the steps and the dog leash tangled around her r/leg. Both knee struck sidewalk. Large bandage on r/lower leg. Abrasion noted on r/knee. Pt denies LOC , Denies striking head. Pt is taking blood thinner.

## 2017-01-04 ENCOUNTER — Ambulatory Visit: Payer: Medicare Other | Admitting: Hematology

## 2017-02-19 ENCOUNTER — Telehealth: Payer: Self-pay | Admitting: Hematology

## 2017-02-19 NOTE — Telephone Encounter (Signed)
Left message re 12/27 moved to 12/17. Schedule mailed

## 2017-03-04 ENCOUNTER — Telehealth: Payer: Self-pay | Admitting: Hematology

## 2017-03-04 ENCOUNTER — Other Ambulatory Visit (HOSPITAL_BASED_OUTPATIENT_CLINIC_OR_DEPARTMENT_OTHER): Payer: Medicare Other

## 2017-03-04 ENCOUNTER — Encounter: Payer: Self-pay | Admitting: Hematology

## 2017-03-04 ENCOUNTER — Ambulatory Visit (HOSPITAL_BASED_OUTPATIENT_CLINIC_OR_DEPARTMENT_OTHER): Payer: Medicare Other | Admitting: Hematology

## 2017-03-04 VITALS — BP 159/61 | HR 63 | Temp 98.8°F | Resp 20 | Ht 70.0 in | Wt 209.4 lb

## 2017-03-04 DIAGNOSIS — Z23 Encounter for immunization: Secondary | ICD-10-CM

## 2017-03-04 DIAGNOSIS — Z8572 Personal history of non-Hodgkin lymphomas: Secondary | ICD-10-CM | POA: Diagnosis not present

## 2017-03-04 DIAGNOSIS — C8298 Follicular lymphoma, unspecified, lymph nodes of multiple sites: Secondary | ICD-10-CM

## 2017-03-04 LAB — CBC & DIFF AND RETIC
BASO%: 0.3 % (ref 0.0–2.0)
Basophils Absolute: 0 10*3/uL (ref 0.0–0.1)
EOS ABS: 0.2 10*3/uL (ref 0.0–0.5)
EOS%: 2.2 % (ref 0.0–7.0)
HCT: 39.8 % (ref 34.8–46.6)
HEMOGLOBIN: 12.7 g/dL (ref 11.6–15.9)
IMMATURE RETIC FRACT: 4.4 % (ref 1.60–10.00)
LYMPH#: 1.9 10*3/uL (ref 0.9–3.3)
LYMPH%: 26 % (ref 14.0–49.7)
MCH: 31.6 pg (ref 25.1–34.0)
MCHC: 31.9 g/dL (ref 31.5–36.0)
MCV: 99 fL (ref 79.5–101.0)
MONO#: 0.6 10*3/uL (ref 0.1–0.9)
MONO%: 7.9 % (ref 0.0–14.0)
NEUT%: 63.6 % (ref 38.4–76.8)
NEUTROS ABS: 4.6 10*3/uL (ref 1.5–6.5)
Platelets: 180 10*3/uL (ref 145–400)
RBC: 4.02 10*6/uL (ref 3.70–5.45)
RDW: 12.9 % (ref 11.2–14.5)
RETIC %: 1.26 % (ref 0.70–2.10)
RETIC CT ABS: 50.65 10*3/uL (ref 33.70–90.70)
WBC: 7.2 10*3/uL (ref 3.9–10.3)

## 2017-03-04 LAB — COMPREHENSIVE METABOLIC PANEL
ALBUMIN: 3.7 g/dL (ref 3.5–5.0)
ALK PHOS: 64 U/L (ref 40–150)
ALT: 10 U/L (ref 0–55)
AST: 14 U/L (ref 5–34)
Anion Gap: 8 mEq/L (ref 3–11)
BILIRUBIN TOTAL: 0.29 mg/dL (ref 0.20–1.20)
BUN: 15.6 mg/dL (ref 7.0–26.0)
CO2: 30 mEq/L — ABNORMAL HIGH (ref 22–29)
Calcium: 9.1 mg/dL (ref 8.4–10.4)
Chloride: 106 mEq/L (ref 98–109)
Creatinine: 0.9 mg/dL (ref 0.6–1.1)
GLUCOSE: 119 mg/dL (ref 70–140)
Potassium: 4.8 mEq/L (ref 3.5–5.1)
SODIUM: 144 meq/L (ref 136–145)
TOTAL PROTEIN: 6.8 g/dL (ref 6.4–8.3)

## 2017-03-04 MED ORDER — PNEUMOCOCCAL 13-VAL CONJ VACC IM SUSP: 0.5000 mL | Freq: Once | INTRAMUSCULAR | Status: DC

## 2017-03-04 MED ORDER — PNEUMOCOCCAL VAC POLYVALENT 25 MCG/0.5ML IJ INJ
0.5000 mL | INJECTION | Freq: Once | INTRAMUSCULAR | Status: AC
  Administered 2017-03-04: 0.5 mL via INTRAMUSCULAR
  Filled 2017-03-04: qty 0.5

## 2017-03-04 NOTE — Telephone Encounter (Signed)
Gave avs and calendar for march 2019 

## 2017-03-14 ENCOUNTER — Other Ambulatory Visit: Payer: Medicare Other

## 2017-03-14 ENCOUNTER — Ambulatory Visit: Payer: Medicare Other | Admitting: Hematology

## 2017-03-18 NOTE — Progress Notes (Signed)
HEMATOLOGY/ONCOLOGY CLINIC NOTE  Date of Service: .03/04/2017   Patient Care Team: Charolette Forward, MD as PCP - General (Cardiology) Physical medicine and rehabilitation: Sharlet Salina DO  CHIEF COMPLAINTS/PURPOSE OF CONSULTATION:  Follow-up for follicular lymphoma  INTERVAL HISTORY  Patient is here for f/u of her follicular lymphoma.  She notes no acute new symptoms. No new worsening cough or fever. No new enlarged LN. No new hepato-splenomegaly. No fevers/chills/nightsweats/weight loss.   MEDICAL HISTORY:  Past Medical History:  Diagnosis Date  . A-fib ([place])   . Arthritis   . Chronic back pain   . COPD (chronic obstructive pulmonary disease) ([place])   . Coronary artery disease   . Glaucoma   . Hearing aid worn   . High cholesterol   . Hypertension   . Lymphadenopathy 01/25/2016  . Myocardial infarction ([place])    2000  . Non Hodgkin's lymphoma ([place])   . Wears dentures    " Top plate"  . Wears glasses   #1 bilateral cataracts #2 COPD  #3 active smoker half a pack per day for about 60 years #4 degenerative arthritis - has had chronic back pain issues with multiple epidural steroid injections from 2014 [5 times] #5 glaucoma #6 degenerative arthritis #7 history of TIA #8 dyslipidemia #9 hypertension #10 coronary artery disease with inferior wall MI in the past, status post PCI to RCA in 2001 #11 hypothyroidism #12 lumbar radiculopathy/neuropathy. #13 paroxysmal atrial fibrillation on anticoagulation with Eliquis. #14 type 2 diabetes #16 obesity  SURGICAL HISTORY:  #1 cardiac catheterization #2 cholecystectomy #3 colonoscopy #4 wisdom teeth extraction.  SOCIAL HISTORY: Currently single. Notes that she lost her female partner 9 months ago. Active smoker half pack per day since her teens  FAMILY HISTORY: Patient reports no history of cancers or blood disorders in the family that she is aware of.  ALLERGIES:  is allergic to hydrocodone and  oxycodone.  MEDICATIONS:  Current Outpatient Medications  Medication Sig Dispense Refill  . ALPRAZolam (XANAX) 0.25 MG tablet Take 0.25 mg by mouth daily as needed for anxiety.     Marland Kitchen amiodarone (PACERONE) 200 MG tablet Take 100 mg by mouth every morning.    Marland Kitchen atorvastatin (LIPITOR) 40 MG tablet Take 40 mg by mouth every morning.     . Bimatoprost (LUMIGAN OP) Place 1 drop into both eyes at bedtime.    Marland Kitchen ELIQUIS 5 MG TABS tablet Take 5 mg by mouth 2 (two) times daily.    Marland Kitchen gabapentin (NEURONTIN) 300 MG capsule Take 300 mg by mouth 2 (two) times daily.    Marland Kitchen losartan (COZAAR) 50 MG tablet Take 50 mg by mouth every morning.    . Melatonin 5 MG TABS Take 5 mg by mouth at bedtime.    . metoprolol succinate (TOPROL-XL) 50 MG 24 hr tablet Take 50 mg by mouth daily.    . nitroGLYCERIN (NITROSTAT) 0.4 MG SL tablet Place 0.4 mg under the tongue every 5 (five) minutes x 3 doses as needed for chest pain.     Marland Kitchen timolol (TIMOPTIC) 0.5 % ophthalmic solution Place 1 drop into both eyes. Instill 1 drop in each eye in the morning and one drop in each eye at night    . traMADol (ULTRAM) 50 MG tablet Take 1-2 tablets (50-100 mg total) by mouth every 6 (six) hours as needed. 30 tablet 1  . dexamethasone (DECADRON) 4 MG tablet Take 2 tablets (8 mg total) by mouth daily. Start the day after bendamustine  chemotherapy for 2 days. Take with food. (Patient not taking: Reported on 12/25/2016) 30 tablet 1  . doxycycline (VIBRA-TABS) 100 MG tablet Take 1 tablet (100 mg total) by mouth 2 (two) times daily. (Patient not taking: Reported on 03/04/2017) 14 tablet 0  . ondansetron (ZOFRAN) 8 MG tablet Take 1 tablet (8 mg total) by mouth 2 (two) times daily as needed for refractory nausea / vomiting. Start on day 2 after bendamustine chemo (Patient not taking: Reported on 12/25/2016) 30 tablet 1  . potassium chloride SA (K-DUR,KLOR-CON) 20 MEQ tablet Take 1 tablet (20 mEq total) by mouth daily. (Patient not taking: Reported on  12/25/2016) 14 tablet 0  . prochlorperazine (COMPAZINE) 10 MG tablet Take 1 tablet (10 mg total) by mouth every 6 (six) hours as needed for nausea or vomiting. (Patient not taking: Reported on 12/25/2016) 30 tablet 0   No current facility-administered medications for this visit.     REVIEW OF SYSTEMS:    10 Point review of Systems was done is negative except as noted above.  PHYSICAL EXAMINATION:  ECOG PERFORMANCE STATUS: 1 - Symptomatic but completely ambulatory Vital signs recorded in the EMR, reviewed.  GENERAL:alert, NAD but anxious due to her new diagnosis. SKIN: some indurated at the site of the rt inguinal LN biopsy with some serous drainage. EYES: normal, conjunctiva are pink and non-injected, sclera clear OROPHARYNX:no exudate, no erythema and lips, buccal mucosa, and tongue normal  NECK: supple, no JVD, thyroid normal size, non-tender, without nodularity LYMPH:  no palpable lymphadenopathy in the cervical, axillary or inguinal area LUNGS: clear to auscultation with normal respiratory effort HEART: regular rate & rhythm,  no murmurs and trace lower extremity edema ABDOMEN: abdomen soft, non-tender, normoactive bowel sounds, no palpable hepatosplenomegaly  Musculoskeletal: no cyanosis of digits and no clubbing  PSYCH: alert & oriented x 3 with fluent speech NEURO: no focal motor/sensory deficits  LABORATORY DATA:  I have reviewed the data as listed  . CBC Latest Ref Rng & Units 03/04/2017 12/11/2016 09/10/2016  WBC 3.9 - 10.3 10e3/uL 7.2 5.7 6.1  Hemoglobin 11.6 - 15.9 g/dL 12.7 13.2 12.6  Hematocrit 34.8 - 46.6 % 39.8 40.5 38.2  Platelets 145 - 400 10e3/uL 180 160 140(L)   . CBC    Component Value Date/Time   WBC 7.2 03/04/2017 1349   WBC 9.8 01/23/2016 1023   RBC 4.02 03/04/2017 1349   RBC 4.26 01/23/2016 1023   HGB 12.7 03/04/2017 1349   HCT 39.8 03/04/2017 1349   PLT 180 03/04/2017 1349   MCV 99.0 03/04/2017 1349   MCH 31.6 03/04/2017 1349   MCH 31.0  01/23/2016 1023   MCHC 31.9 03/04/2017 1349   MCHC 31.7 01/23/2016 1023   RDW 12.9 03/04/2017 1349   LYMPHSABS 1.9 03/04/2017 1349   MONOABS 0.6 03/04/2017 1349   EOSABS 0.2 03/04/2017 1349   BASOSABS 0.0 03/04/2017 1349    . CMP Latest Ref Rng & Units 03/04/2017 12/11/2016 09/10/2016  Glucose 70 - 140 mg/dl 119 104 104  BUN 7.0 - 26.0 mg/dL 15.6 9.3 12.3  Creatinine 0.6 - 1.1 mg/dL 0.9 0.8 0.8  Sodium 136 - 145 mEq/L 144 143 141  Potassium 3.5 - 5.1 mEq/L 4.8 4.0 4.3  Chloride 101 - 111 mmol/L - - -  CO2 22 - 29 mEq/L 30(H) 29 27  Calcium 8.4 - 10.4 mg/dL 9.1 9.2 9.2  Total Protein 6.4 - 8.3 g/dL 6.8 6.6 6.5  Total Bilirubin 0.20 - 1.20 mg/dL 0.29 0.43 0.45  Alkaline Phos 40 - 150 U/L 64 66 58  AST 5 - 34 U/L 14 17 18   ALT 0 - 55 U/L 10 11 12    . Lab Results  Component Value Date   LDH 198 12/11/2016    .          RADIOGRAPHIC STUDIES: I have personally reviewed the radiological images as listed and agreed with the findings in the report. No results found.  PET/CT 12/22/2015  IMPRESSION: 1. Hypermetabolic confluent lower retroperitoneal / bilateral common iliac, right external iliac and right inguinal lymphadenopathy, consistent with lymphoma. 2. Solitary focus of osseous hypermetabolism in the left upper sacrum consistent with osseous involvement by lymphoma. 3. No metabolically active lymphoma in the neck or chest. Spleen is normal in size and metabolism. 4. Additional findings include aortic atherosclerosis, three-vessel coronary atherosclerosis and mild sigmoid diverticulosis.   Electronically Signed   By: Ilona Sorrel M.D.   On: 12/22/2015 17:21   ASSESSMENT & PLAN:   76 year old Caucasian female with multiple chronic medical comorbidities as noted above with   #1 Stage IVB Low grade Follicular Lymphoma with extensive retroperitoneal lymphadenopathy and right inguinal lymphadenopathy as well as osseous involvement of the sacrum. LDH level  within normal limits.  S/p 5 cycles of BR (Bendamustine 70mg /m2)  PET CT scan after 5 cycles of Bendamustine and Rituxan done on 09/06/2016 shows no residual metabolic active disease and no evidence of disease progression.  #2 Rt inguinal LN biopsy site infection and seroma -now resolved Plan - patient has no clinical or lab evidence of NHL/FL progression at this time. -previously discussed pro's vs con's of maintenance Rituxan - patient had and continues to decline this option. -infection prevention strategies. -counseled on smoking cessation -patient still somewhat resistant to cut down but sounds like she is a little more open to the idea. -educated on concerning symptoms of lymphoma progression to monitor for.  Prevnar vaccine today CT chest/abd/pelvis in 12 weeks RTC with Dr Irene Limbo in 3 months with labs and CT   All of the patients , her friends questions were answered to their apparent satisfaction. The patient knows to call the clinic with any problems, questions or concerns.  I spent 20 minutes counseling the patient face to face. The total time spent in the appointment was 20 minutes and more than 50% was on counseling and direct patient cares.  Sullivan Lone MD [place] AAHIVMS [facility] Hematology/Oncology Physician [facility]  (Office):       [phone] (Work cell):  [phone] (Fax):           [phone]

## 2017-05-01 DIAGNOSIS — I2510 Atherosclerotic heart disease of native coronary artery without angina pectoris: Secondary | ICD-10-CM | POA: Diagnosis not present

## 2017-05-01 DIAGNOSIS — C8590 Non-Hodgkin lymphoma, unspecified, unspecified site: Secondary | ICD-10-CM | POA: Diagnosis not present

## 2017-05-01 DIAGNOSIS — I252 Old myocardial infarction: Secondary | ICD-10-CM | POA: Diagnosis not present

## 2017-05-01 DIAGNOSIS — E119 Type 2 diabetes mellitus without complications: Secondary | ICD-10-CM | POA: Diagnosis not present

## 2017-05-01 DIAGNOSIS — I10 Essential (primary) hypertension: Secondary | ICD-10-CM | POA: Diagnosis not present

## 2017-05-01 DIAGNOSIS — M1990 Unspecified osteoarthritis, unspecified site: Secondary | ICD-10-CM | POA: Diagnosis not present

## 2017-05-01 DIAGNOSIS — E785 Hyperlipidemia, unspecified: Secondary | ICD-10-CM | POA: Diagnosis not present

## 2017-05-01 DIAGNOSIS — I482 Chronic atrial fibrillation: Secondary | ICD-10-CM | POA: Diagnosis not present

## 2017-05-01 DIAGNOSIS — F17290 Nicotine dependence, other tobacco product, uncomplicated: Secondary | ICD-10-CM | POA: Diagnosis not present

## 2017-05-09 ENCOUNTER — Other Ambulatory Visit: Payer: Self-pay

## 2017-05-09 ENCOUNTER — Telehealth: Payer: Self-pay

## 2017-05-09 DIAGNOSIS — C8298 Follicular lymphoma, unspecified, lymph nodes of multiple sites: Secondary | ICD-10-CM

## 2017-05-09 NOTE — Telephone Encounter (Signed)
Creatinine required prior to scan on 3/4. Pt agreeable to schedule lab appt on 3/1 at 1000. Lab appt cancelled 3/18, but pt still to come see Dr. Irene Limbo on 3/18. Pt verbalized understanding of appt dates and times for lab, scan, and f/u with MD. Date for lab orders changed.

## 2017-05-17 ENCOUNTER — Inpatient Hospital Stay: Payer: PPO | Attending: Hematology

## 2017-05-17 DIAGNOSIS — E119 Type 2 diabetes mellitus without complications: Secondary | ICD-10-CM | POA: Insufficient documentation

## 2017-05-17 DIAGNOSIS — C8298 Follicular lymphoma, unspecified, lymph nodes of multiple sites: Secondary | ICD-10-CM

## 2017-05-17 DIAGNOSIS — F17210 Nicotine dependence, cigarettes, uncomplicated: Secondary | ICD-10-CM | POA: Diagnosis not present

## 2017-05-17 DIAGNOSIS — E785 Hyperlipidemia, unspecified: Secondary | ICD-10-CM | POA: Diagnosis not present

## 2017-05-17 DIAGNOSIS — Z8572 Personal history of non-Hodgkin lymphomas: Secondary | ICD-10-CM | POA: Insufficient documentation

## 2017-05-17 DIAGNOSIS — I10 Essential (primary) hypertension: Secondary | ICD-10-CM | POA: Insufficient documentation

## 2017-05-17 DIAGNOSIS — I480 Paroxysmal atrial fibrillation: Secondary | ICD-10-CM | POA: Diagnosis not present

## 2017-05-17 LAB — CMP (CANCER CENTER ONLY)
ALK PHOS: 64 U/L (ref 40–150)
ALT: 10 U/L (ref 0–55)
AST: 15 U/L (ref 5–34)
Albumin: 3.6 g/dL (ref 3.5–5.0)
Anion gap: 7 (ref 3–11)
BUN: 13 mg/dL (ref 7–26)
CALCIUM: 9.5 mg/dL (ref 8.4–10.4)
CO2: 28 mmol/L (ref 22–29)
CREATININE: 0.83 mg/dL (ref 0.60–1.10)
Chloride: 106 mmol/L (ref 98–109)
GFR, Est AFR Am: >60 mL/min (ref >60)
Glucose, Bld: 102 mg/dL (ref 70–140)
Potassium: 4 mmol/L (ref 3.5–5.1)
Sodium: 141 mmol/L (ref 136–145)
TOTAL PROTEIN: 6.8 g/dL (ref 6.4–8.3)
Total Bilirubin: 0.5 mg/dL (ref 0.2–1.2)

## 2017-05-17 LAB — CBC WITH DIFFERENTIAL (CANCER CENTER ONLY)
BASOS ABS: 0 10*3/uL (ref 0.0–0.1)
BASOS PCT: 1 %
EOS ABS: 0.2 10*3/uL (ref 0.0–0.5)
Eosinophils Relative: 3 %
HCT: 40.1 % (ref 34.8–46.6)
HEMOGLOBIN: 13.2 g/dL (ref 11.6–15.9)
Lymphocytes Relative: 19 %
Lymphs Abs: 1.1 10*3/uL (ref 0.9–3.3)
MCH: 31.2 pg (ref 25.1–34.0)
MCHC: 32.9 g/dL (ref 31.5–36.0)
MCV: 94.9 fL (ref 79.5–101.0)
MONOS PCT: 10 %
Monocytes Absolute: 0.6 10*3/uL (ref 0.1–0.9)
Neutro Abs: 3.9 10*3/uL (ref 1.5–6.5)
Neutrophils Relative %: 67 %
Platelet Count: 188 10*3/uL (ref 145–400)
RBC: 4.23 MIL/uL (ref 3.70–5.45)
RDW: 14 % (ref 11.2–14.5)
WBC: 5.7 10*3/uL (ref 3.9–10.3)

## 2017-05-17 LAB — LACTATE DEHYDROGENASE: LDH: 220 U/L (ref 125–245)

## 2017-05-20 ENCOUNTER — Ambulatory Visit (HOSPITAL_COMMUNITY): Payer: Medicare Other

## 2017-05-20 ENCOUNTER — Ambulatory Visit (HOSPITAL_COMMUNITY)
Admission: RE | Admit: 2017-05-20 | Discharge: 2017-05-20 | Disposition: A | Discharge status: Home / Self Care | Payer: PPO | Source: Ambulatory Visit | Attending: Hematology | Admitting: Hematology

## 2017-05-20 ENCOUNTER — Encounter (HOSPITAL_COMMUNITY): Payer: Self-pay

## 2017-05-20 DIAGNOSIS — C8298 Follicular lymphoma, unspecified, lymph nodes of multiple sites: Secondary | ICD-10-CM | POA: Diagnosis not present

## 2017-05-20 DIAGNOSIS — R05 Cough: Secondary | ICD-10-CM | POA: Diagnosis not present

## 2017-05-20 DIAGNOSIS — C8291 Follicular lymphoma, unspecified, lymph nodes of head, face, and neck: Secondary | ICD-10-CM | POA: Diagnosis not present

## 2017-05-20 DIAGNOSIS — Z23 Encounter for immunization: Secondary | ICD-10-CM

## 2017-05-20 MED ORDER — IOPAMIDOL (ISOVUE-300) INJECTION 61%
INTRAVENOUS | Status: AC
  Filled 2017-05-20: qty 100

## 2017-05-20 MED ORDER — IOPAMIDOL (ISOVUE-300) INJECTION 61%
100.0000 mL | Freq: Once | INTRAVENOUS | Status: AC | PRN
  Administered 2017-05-20: 100 mL via INTRAVENOUS

## 2017-05-20 MED ORDER — SODIUM CHLORIDE 0.9 % IJ SOLN
INTRAMUSCULAR | Status: AC
  Filled 2017-05-20: qty 50

## 2017-05-21 DIAGNOSIS — M5416 Radiculopathy, lumbar region: Secondary | ICD-10-CM | POA: Diagnosis not present

## 2017-05-21 DIAGNOSIS — M48062 Spinal stenosis, lumbar region with neurogenic claudication: Secondary | ICD-10-CM | POA: Diagnosis not present

## 2017-05-21 DIAGNOSIS — M5136 Other intervertebral disc degeneration, lumbar region: Secondary | ICD-10-CM | POA: Diagnosis not present

## 2017-05-30 NOTE — Progress Notes (Signed)
HEMATOLOGY/ONCOLOGY CLINIC NOTE  Date of Service: 06/03/17   Patient Care Team: Charolette Forward, MD as PCP - General (Cardiology) Physical medicine and rehabilitation: Sharlet Salina DO  CHIEF COMPLAINTS/PURPOSE OF CONSULTATION:  F/u for follicular lymphoma  INTERVAL HISTORY  Samantha [name] is here for f/u of her follicular lymphoma.  The patient's last visit with [place] was on 03/04/17. She is accompanied today by her sister-in-law. The pt reports that she is doing well overall.  The pt notes that she has had a cold for the last couple weeks and that it is slowly subsiding. The pt notes that she continues to smoke but has decreased her smoking since her last visit, and notes that she continues to smoke because she wants to. She notes that she expects to become more active as the weather warms.  Of note since the patient's last visit, pt has had CT C/A/P completed on 05/20/17 with results revealing Left para-aortic nodal soft tissue measuring up to 1.6 cm short axis, grossly unchanged, favoring treated lymphoma. No findings suspicious for active lymphoma in the chest, abdomen, or pelvis. Spleen is normal in size. Prior ground-glass nodule in the right lower lobe has resolved. Dedicated follow-up imaging is not required per Fleischner Society guidelines.  Lab results (05/17/17) of CBC, CMP, and LDH is as follows: all values are WNL including LDH at 220.  On review of systems, pt reports a cold, and denies abdominal pains, and any other symptoms.    MEDICAL HISTORY:  Past Medical History:  Diagnosis Date  . A-fib ([place])   . Arthritis   . Chronic back pain   . COPD (chronic obstructive pulmonary disease) ([place])   . Coronary artery disease   . Glaucoma   . Hearing aid worn   . High cholesterol   . Hypertension   . Lymphadenopathy 01/25/2016  . Myocardial infarction ([place])    2000  . Non Hodgkin's lymphoma ([place])   . Wears dentures    " Top plate"  . Wears glasses   #1 bilateral  cataracts #2 COPD  #3 active smoker half a pack per day for about 60 years #4 degenerative arthritis - has had chronic back pain issues with multiple epidural steroid injections from 2014 [5 times] #5 glaucoma #6 degenerative arthritis #7 history of TIA #8 dyslipidemia #9 hypertension #10 coronary artery disease with inferior wall MI in the past, status post PCI to RCA in 2001 #11 hypothyroidism #12 lumbar radiculopathy/neuropathy. #13 paroxysmal atrial fibrillation on anticoagulation with Eliquis. #14 type 2 diabetes #16 obesity  SURGICAL HISTORY:  #1 cardiac catheterization #2 cholecystectomy #3 colonoscopy #4 wisdom teeth extraction.  SOCIAL HISTORY: Currently single. Notes that she lost her female partner 9 months ago. Active smoker half pack per day since her teens  FAMILY HISTORY: Patient reports no history of cancers or blood disorders in the family that she is aware of.  ALLERGIES:  is allergic to hydrocodone and oxycodone.  MEDICATIONS:  Current Outpatient Medications  Medication Sig Dispense Refill  . ALPRAZolam (XANAX) 0.25 MG tablet Take 0.25 mg by mouth daily as needed for anxiety.     Marland Kitchen amiodarone (PACERONE) 200 MG tablet Take 100 mg by mouth every morning.    Marland Kitchen atorvastatin (LIPITOR) 40 MG tablet Take 40 mg by mouth every morning.     . Bimatoprost (LUMIGAN OP) Place 1 drop into both eyes at bedtime.    Marland Kitchen dexamethasone (DECADRON) 4 MG tablet Take 2 tablets (8 mg total) by  mouth daily. Start the day after bendamustine chemotherapy for 2 days. Take with food. (Patient not taking: Reported on 12/25/2016) 30 tablet 1  . doxycycline (VIBRA-TABS) 100 MG tablet Take 1 tablet (100 mg total) by mouth 2 (two) times daily. (Patient not taking: Reported on 03/04/2017) 14 tablet 0  . ELIQUIS 5 MG TABS tablet Take 5 mg by mouth 2 (two) times daily.    Marland Kitchen gabapentin (NEURONTIN) 300 MG capsule Take 300 mg by mouth 2 (two) times daily.    Marland Kitchen losartan (COZAAR) 50 MG tablet Take  50 mg by mouth every morning.    . Melatonin 5 MG TABS Take 5 mg by mouth at bedtime.    . metoprolol succinate (TOPROL-XL) 50 MG 24 hr tablet Take 50 mg by mouth daily.    . nitroGLYCERIN (NITROSTAT) 0.4 MG SL tablet Place 0.4 mg under the tongue every 5 (five) minutes x 3 doses as needed for chest pain.     Marland Kitchen ondansetron (ZOFRAN) 8 MG tablet Take 1 tablet (8 mg total) by mouth 2 (two) times daily as needed for refractory nausea / vomiting. Start on day 2 after bendamustine chemo (Patient not taking: Reported on 12/25/2016) 30 tablet 1  . potassium chloride SA (K-DUR,KLOR-CON) 20 MEQ tablet Take 1 tablet (20 mEq total) by mouth daily. (Patient not taking: Reported on 12/25/2016) 14 tablet 0  . prochlorperazine (COMPAZINE) 10 MG tablet Take 1 tablet (10 mg total) by mouth every 6 (six) hours as needed for nausea or vomiting. (Patient not taking: Reported on 12/25/2016) 30 tablet 0  . timolol (TIMOPTIC) 0.5 % ophthalmic solution Place 1 drop into both eyes. Instill 1 drop in each eye in the morning and one drop in each eye at night    . traMADol (ULTRAM) 50 MG tablet Take 1-2 tablets (50-100 mg total) by mouth every 6 (six) hours as needed. 30 tablet 1   No current facility-administered medications for this visit.     REVIEW OF SYSTEMS:    .10 Point review of Systems was done is negative except as noted above.   PHYSICAL EXAMINATION:  ECOG PERFORMANCE STATUS: 1 - Symptomatic but completely ambulatory Vital signs recorded in the EMR, reviewed.  . GENERAL:alert, in no acute distress and comfortable SKIN: no acute rashes, no significant lesions EYES: conjunctiva are pink and non-injected, sclera anicteric OROPHARYNX: MMM, no exudates, no oropharyngeal erythema or ulceration NECK: supple, no JVD LYMPH:  no palpable lymphadenopathy in the cervical, axillary or inguinal regions LUNGS: clear to auscultation b/l with normal respiratory effort HEART: regular rate & rhythm ABDOMEN:  normoactive  bowel sounds , non tender, not distended. Extremity: no pedal edema PSYCH: alert & oriented x 3 with fluent speech NEURO: no focal motor/sensory deficits  LABORATORY DATA:  I have reviewed the data as listed  . CBC Latest Ref Rng & Units 05/17/2017 03/04/2017 12/11/2016  WBC 3.9 - 10.3 K/uL 5.7 7.2 5.7  Hemoglobin 11.6 - 15.9 g/dL - 12.7 13.2  Hematocrit 34.8 - 46.6 % 40.1 39.8 40.5  Platelets 145 - 400 K/uL 188 180 160   . CBC    Component Value Date/Time   WBC 5.7 05/17/2017 0936   WBC 7.2 03/04/2017 1349   WBC 9.8 01/23/2016 1023   RBC 4.23 05/17/2017 0936   HGB 12.7 03/04/2017 1349   HCT 40.1 05/17/2017 0936   HCT 39.8 03/04/2017 1349   PLT 188 05/17/2017 0936   PLT 180 03/04/2017 1349   MCV 94.9 05/17/2017 0936  MCV 99.0 03/04/2017 1349   MCH 31.2 05/17/2017 0936   MCHC 32.9 05/17/2017 0936   RDW 14.0 05/17/2017 0936   RDW 12.9 03/04/2017 1349   LYMPHSABS 1.1 05/17/2017 0936   LYMPHSABS 1.9 03/04/2017 1349   MONOABS 0.6 05/17/2017 0936   MONOABS 0.6 03/04/2017 1349   EOSABS 0.2 05/17/2017 0936   EOSABS 0.2 03/04/2017 1349   BASOSABS 0.0 05/17/2017 0936   BASOSABS 0.0 03/04/2017 1349    . CMP Latest Ref Rng & Units 05/17/2017 03/04/2017 12/11/2016  Glucose 70 - 140 mg/dL 102 119 104  BUN 7 - 26 mg/dL 13 15.6 9.3  Creatinine 0.60 - 1.10 mg/dL 0.83 0.9 0.8  Sodium 136 - 145 mmol/L 141 144 143  Potassium 3.5 - 5.1 mmol/L 4.0 4.8 4.0  Chloride 98 - 109 mmol/L 106 - -  CO2 22 - 29 mmol/L 28 30(H) 29  Calcium 8.4 - 10.4 mg/dL 9.5 9.1 9.2  Total Protein 6.4 - 8.3 g/dL 6.8 6.8 6.6  Total Bilirubin 0.2 - 1.2 mg/dL 0.5 0.29 0.43  Alkaline Phos 40 - 150 U/L 64 64 66  AST 5 - 34 U/L 15 14 17   ALT 0 - 55 U/L 10 10 11    . Lab Results  Component Value Date   LDH 220 05/17/2017    .          RADIOGRAPHIC STUDIES: I have personally reviewed the radiological images as listed and agreed with the findings in the report. Ct Chest W Contrast  Result Date:  05/20/2017 CLINICAL DATA:  Follicular lymphoma, diagnosed 2018, chemotherapy complete. Cough x2 days. EXAM: CT CHEST, ABDOMEN, AND PELVIS WITH CONTRAST TECHNIQUE: Multidetector CT imaging of the chest, abdomen and pelvis was performed following the standard protocol during bolus administration of intravenous contrast. CONTRAST:  110mL ISOVUE-300 IOPAMIDOL (ISOVUE-300) INJECTION 61% COMPARISON:  PET-CT dated 09/06/2016. FINDINGS: CT CHEST FINDINGS Cardiovascular: Heart is normal in size.  No pericardial effusion. No evidence of thoracic aortic aneurysm. Atherosclerotic calcifications of the aortic arch. Mild three-vessel coronary atherosclerosis. Mediastinum/Nodes: 5 mm short axis low right paratracheal node and 6 mm short axis subcarinal node, within normal limits. No suspicious hilar or axillary lymphadenopathy. Visualized thyroid is unremarkable. Lungs/Pleura: Mild subpleural reticulation/fibrosis in the lungs bilaterally. No suspicious pulmonary nodules. Subpleural ground-glass nodule in the right lower lobe on prior PET has resolved. No focal consolidation. No pleural effusion or pneumothorax. Musculoskeletal: Degenerative changes of the thoracic spine. CT ABDOMEN PELVIS FINDINGS Hepatobiliary: Liver is within normal limits. Status post cholecystectomy. No intrahepatic or extrahepatic ductal dilatation. Pancreas: Within normal limits. Spleen: Spleen is normal in size. Adrenals/Urinary Tract: Adrenal glands are within normal limits. Kidneys are within normal limits.  No hydronephrosis. Bladder is within normal limits. Stomach/Bowel: Stomach is within normal limits. No evidence of bowel obstruction. Normal appendix (series 2/image 39). Mild sigmoid diverticulosis, without evidence of diverticulitis. Vascular/Lymphatic: No evidence of abdominal aortic aneurysm. Atherosclerotic calcifications of the abdominal aorta and branch vessels. Left para-aortic nodal soft tissue measuring up to 1.6 cm short axis (series  2/image 35), grossly unchanged. Additional 6 mm short axis aortocaval node (series 2/image 82), possibly mildly improved. These findings did not demonstrate significant hypermetabolism on PET and are favored to reflect treated lymphoma. Reproductive: Uterus is within normal limits. Bilateral ovaries are within normal limits. Other: No abdominopelvic ascites. 3.3 x 1.9 cm postoperative seroma in the right inguinal region (series 2/image 128), non FDG avid on prior PET, grossly unchanged. Musculoskeletal: Degenerative changes of the lumbar spine. IMPRESSION:  Left para-aortic nodal soft tissue measuring up to 1.6 cm short axis, grossly unchanged, favoring treated lymphoma. No findings suspicious for active lymphoma in the chest, abdomen, or pelvis. Spleen is normal in size. Prior ground-glass nodule in the right lower lobe has resolved. Dedicated follow-up imaging is not required per Fleischner Society guidelines. Additional stable ancillary findings as above. Electronically Signed   By: Julian Hy M.D.   On: 05/20/2017 09:29   Ct Abdomen Pelvis W Contrast  Result Date: 05/20/2017 CLINICAL DATA:  Follicular lymphoma, diagnosed 2018, chemotherapy complete. Cough x2 days. EXAM: CT CHEST, ABDOMEN, AND PELVIS WITH CONTRAST TECHNIQUE: Multidetector CT imaging of the chest, abdomen and pelvis was performed following the standard protocol during bolus administration of intravenous contrast. CONTRAST:  141mL ISOVUE-300 IOPAMIDOL (ISOVUE-300) INJECTION 61% COMPARISON:  PET-CT dated 09/06/2016. FINDINGS: CT CHEST FINDINGS Cardiovascular: Heart is normal in size.  No pericardial effusion. No evidence of thoracic aortic aneurysm. Atherosclerotic calcifications of the aortic arch. Mild three-vessel coronary atherosclerosis. Mediastinum/Nodes: 5 mm short axis low right paratracheal node and 6 mm short axis subcarinal node, within normal limits. No suspicious hilar or axillary lymphadenopathy. Visualized thyroid is  unremarkable. Lungs/Pleura: Mild subpleural reticulation/fibrosis in the lungs bilaterally. No suspicious pulmonary nodules. Subpleural ground-glass nodule in the right lower lobe on prior PET has resolved. No focal consolidation. No pleural effusion or pneumothorax. Musculoskeletal: Degenerative changes of the thoracic spine. CT ABDOMEN PELVIS FINDINGS Hepatobiliary: Liver is within normal limits. Status post cholecystectomy. No intrahepatic or extrahepatic ductal dilatation. Pancreas: Within normal limits. Spleen: Spleen is normal in size. Adrenals/Urinary Tract: Adrenal glands are within normal limits. Kidneys are within normal limits.  No hydronephrosis. Bladder is within normal limits. Stomach/Bowel: Stomach is within normal limits. No evidence of bowel obstruction. Normal appendix (series 2/image 39). Mild sigmoid diverticulosis, without evidence of diverticulitis. Vascular/Lymphatic: No evidence of abdominal aortic aneurysm. Atherosclerotic calcifications of the abdominal aorta and branch vessels. Left para-aortic nodal soft tissue measuring up to 1.6 cm short axis (series 2/image 35), grossly unchanged. Additional 6 mm short axis aortocaval node (series 2/image 82), possibly mildly improved. These findings did not demonstrate significant hypermetabolism on PET and are favored to reflect treated lymphoma. Reproductive: Uterus is within normal limits. Bilateral ovaries are within normal limits. Other: No abdominopelvic ascites. 3.3 x 1.9 cm postoperative seroma in the right inguinal region (series 2/image 128), non FDG avid on prior PET, grossly unchanged. Musculoskeletal: Degenerative changes of the lumbar spine. IMPRESSION: Left para-aortic nodal soft tissue measuring up to 1.6 cm short axis, grossly unchanged, favoring treated lymphoma. No findings suspicious for active lymphoma in the chest, abdomen, or pelvis. Spleen is normal in size. Prior ground-glass nodule in the right lower lobe has resolved.  Dedicated follow-up imaging is not required per Fleischner Society guidelines. Additional stable ancillary findings as above. Electronically Signed   By: Julian Hy M.D.   On: 05/20/2017 09:29    PET/CT 12/22/2015  IMPRESSION: 1. Hypermetabolic confluent lower retroperitoneal / bilateral common iliac, right external iliac and right inguinal lymphadenopathy, consistent with lymphoma. 2. Solitary focus of osseous hypermetabolism in the left upper sacrum consistent with osseous involvement by lymphoma. 3. No metabolically active lymphoma in the neck or chest. Spleen is normal in size and metabolism. 4. Additional findings include aortic atherosclerosis, three-vessel coronary atherosclerosis and mild sigmoid diverticulosis.   Electronically Signed   By: Ilona Sorrel M.D.   On: 12/22/2015 17:21   ASSESSMENT & PLAN:   77 year old Caucasian female with multiple chronic medical comorbidities  as noted above with   #1 Stage IVB Low grade Follicular Lymphoma with extensive retroperitoneal lymphadenopathy and right inguinal lymphadenopathy as well as osseous involvement of the sacrum. LDH level within normal limits.  S/p 5 cycles of BR (Bendamustine 70mg /m2)  PET CT scan after 5 cycles of Bendamustine and Rituxan done on 09/06/2016 shows no residual metabolic active disease and no evidence of disease progression.  CT C/A/P on 05/20/17 reveals no radiographic evidence of disease progression.   #2 Rt inguinal LN biopsy site infection and seroma -now resolved Plan -previously discussed pro's vs con's of maintenance Rituxan - patient had and continues to decline this option. -infection prevention strategies. -Discussed pt labwork today; blood counts and chemistries are normal and LDH is WNL at 220.  -CT 05/20/17  was reviewed with pt and her sister in law which shows no radiographic evidence of returned disease.  -Pt has no clinical, radiographic or lab evidence of disease progression at  this time.  -Advised her to continue to keep an eye on her BP and follow up with her PCP.  -Counseled pt again on smoking cessation, pt notes that she has cut back her smoking but continues because she simply wants to.   RTC with Dr Irene Limbo with labs in 3 months   All of the patients , her friends questions were answered to their apparent satisfaction. The patient knows to call the clinic with any problems, questions or concerns.  . The total time spent in the appointment was 25 minutes and more than 50% was on counseling and direct patient cares.    Sullivan Lone MD [place] AAHIVMS [facility] Hematology/Oncology Physician [place]  (Office):       [phone] (Work cell):  [phone] (Fax):           [phone]  This document serves as a record of services personally performed by Sullivan Lone, MD. It was created on his behalf by Baldwin [place], a trained medical scribe. The creation of this record is based on the scribe's personal observations and the provider's statements to them.   .I have reviewed the above documentation for accuracy and completeness, and I agree with the above. Brunetta Genera MD MS

## 2017-06-03 ENCOUNTER — Inpatient Hospital Stay (HOSPITAL_BASED_OUTPATIENT_CLINIC_OR_DEPARTMENT_OTHER): Payer: PPO | Admitting: Hematology

## 2017-06-03 ENCOUNTER — Telehealth: Payer: Self-pay

## 2017-06-03 ENCOUNTER — Other Ambulatory Visit: Payer: Medicare Other

## 2017-06-03 VITALS — BP 185/60 | HR 72 | Temp 98.0°F | Resp 20 | Ht 70.0 in | Wt 210.6 lb

## 2017-06-03 DIAGNOSIS — F17210 Nicotine dependence, cigarettes, uncomplicated: Secondary | ICD-10-CM | POA: Diagnosis not present

## 2017-06-03 DIAGNOSIS — Z8572 Personal history of non-Hodgkin lymphomas: Secondary | ICD-10-CM

## 2017-06-03 DIAGNOSIS — E119 Type 2 diabetes mellitus without complications: Secondary | ICD-10-CM

## 2017-06-03 DIAGNOSIS — C8298 Follicular lymphoma, unspecified, lymph nodes of multiple sites: Secondary | ICD-10-CM

## 2017-06-03 DIAGNOSIS — I10 Essential (primary) hypertension: Secondary | ICD-10-CM

## 2017-06-03 NOTE — Telephone Encounter (Signed)
Printed avs and calender of upcoming appointment per 3/18 los

## 2017-06-13 ENCOUNTER — Other Ambulatory Visit: Payer: Self-pay | Admitting: Hematology

## 2017-06-13 DIAGNOSIS — C8298 Follicular lymphoma, unspecified, lymph nodes of multiple sites: Secondary | ICD-10-CM

## 2017-06-21 DIAGNOSIS — M48062 Spinal stenosis, lumbar region with neurogenic claudication: Secondary | ICD-10-CM | POA: Diagnosis not present

## 2017-06-21 DIAGNOSIS — M5416 Radiculopathy, lumbar region: Secondary | ICD-10-CM | POA: Diagnosis not present

## 2017-06-21 DIAGNOSIS — M5136 Other intervertebral disc degeneration, lumbar region: Secondary | ICD-10-CM | POA: Diagnosis not present

## 2017-07-18 DIAGNOSIS — H40153 Residual stage of open-angle glaucoma, bilateral: Secondary | ICD-10-CM | POA: Diagnosis not present

## 2017-07-30 DIAGNOSIS — M1712 Unilateral primary osteoarthritis, left knee: Secondary | ICD-10-CM | POA: Diagnosis not present

## 2017-07-31 DIAGNOSIS — E119 Type 2 diabetes mellitus without complications: Secondary | ICD-10-CM | POA: Diagnosis not present

## 2017-07-31 DIAGNOSIS — M1990 Unspecified osteoarthritis, unspecified site: Secondary | ICD-10-CM | POA: Diagnosis not present

## 2017-07-31 DIAGNOSIS — I10 Essential (primary) hypertension: Secondary | ICD-10-CM | POA: Diagnosis not present

## 2017-07-31 DIAGNOSIS — I2510 Atherosclerotic heart disease of native coronary artery without angina pectoris: Secondary | ICD-10-CM | POA: Diagnosis not present

## 2017-07-31 DIAGNOSIS — I252 Old myocardial infarction: Secondary | ICD-10-CM | POA: Diagnosis not present

## 2017-07-31 DIAGNOSIS — E785 Hyperlipidemia, unspecified: Secondary | ICD-10-CM | POA: Diagnosis not present

## 2017-07-31 DIAGNOSIS — F17290 Nicotine dependence, other tobacco product, uncomplicated: Secondary | ICD-10-CM | POA: Diagnosis not present

## 2017-07-31 DIAGNOSIS — I4891 Unspecified atrial fibrillation: Secondary | ICD-10-CM | POA: Diagnosis not present

## 2017-08-30 NOTE — Progress Notes (Signed)
HEMATOLOGY/ONCOLOGY CLINIC NOTE  Date of Service: 09/02/17   Patient Care Team: Charolette Forward, MD as PCP - General (Cardiology) Physical medicine and rehabilitation: Sharlet Salina DO  CHIEF COMPLAINTS/PURPOSE OF CONSULTATION:  F/u for follicular lymphoma  INTERVAL HISTORY  Samantha [name] is here for f/u of her follicular lymphoma. The patient's last visit with [place] was on 06/03/17. She is accompanied today by her partner. The pt reports that she is doing well overall.   The pt reports that she developed a boil on her right cheek for which she has seen Dr Terrence Dupont and began taking antibiotics, without strict compiance. She denies any other skin nodules, boils or abnormalities. She has also began a blood pressure medication.   She continues to smoke half a pack of cigarettes each day and is very committed to continuing smoking.   Lab results today (09/02/17) of CBC, CMP, and Reticulocytes is as follows: all values are WNL. LDH 09/02/17 is ... Lab Results  Component Value Date   LDH 208 09/02/2017   On review of systems, pt reports right cheek boil, good energy levels, stable weight, moving her bowels well, and denies fevers, chills, night sweats, abdominal pains, SOB, back pains, noticing any other lumps or bumps, and any other symptoms.    MEDICAL HISTORY:  Past Medical History:  Diagnosis Date  . A-fib ([place])   . Arthritis   . Chronic back pain   . COPD (chronic obstructive pulmonary disease) ([place])   . Coronary artery disease   . Glaucoma   . Hearing aid worn   . High cholesterol   . Hypertension   . Lymphadenopathy 01/25/2016  . Myocardial infarction ([place])    2000  . Non Hodgkin's lymphoma ([place])   . Wears dentures    " Top plate"  . Wears glasses   #1 bilateral cataracts #2 COPD  #3 active smoker half a pack per day for about 60 years #4 degenerative arthritis - has had chronic back pain issues with multiple epidural steroid injections from 2014 [5 times] #5  glaucoma #6 degenerative arthritis #7 history of TIA #8 dyslipidemia #9 hypertension #10 coronary artery disease with inferior wall MI in the past, status post PCI to RCA in 2001 #11 hypothyroidism #12 lumbar radiculopathy/neuropathy. #13 paroxysmal atrial fibrillation on anticoagulation with Eliquis. #14 type 2 diabetes #16 obesity  SURGICAL HISTORY:  #1 cardiac catheterization #2 cholecystectomy #3 colonoscopy #4 wisdom teeth extraction.  SOCIAL HISTORY: Currently single. Notes that she lost her female partner 9 months ago. Active smoker half pack per day since her teens  FAMILY HISTORY: Patient reports no history of cancers or blood disorders in the family that she is aware of.  ALLERGIES:  is allergic to hydrocodone and oxycodone.  MEDICATIONS:  Current Outpatient Medications  Medication Sig Dispense Refill  . ALPRAZolam (XANAX) 0.25 MG tablet Take 0.25 mg by mouth daily as needed for anxiety.     Marland Kitchen amiodarone (PACERONE) 200 MG tablet Take 100 mg by mouth every morning.    Marland Kitchen atorvastatin (LIPITOR) 40 MG tablet Take 40 mg by mouth every morning.     . Bimatoprost (LUMIGAN OP) Place 1 drop into both eyes at bedtime.    Marland Kitchen dexamethasone (DECADRON) 4 MG tablet Take 2 tablets (8 mg total) by mouth daily. Start the day after bendamustine chemotherapy for 2 days. Take with food. (Patient not taking: Reported on 12/25/2016) 30 tablet 1  . doxycycline (VIBRA-TABS) 100 MG tablet Take 1 tablet (100  mg total) by mouth 2 (two) times daily. (Patient not taking: Reported on 03/04/2017) 14 tablet 0  . ELIQUIS 5 MG TABS tablet Take 5 mg by mouth 2 (two) times daily.    Marland Kitchen gabapentin (NEURONTIN) 300 MG capsule Take 300 mg by mouth 2 (two) times daily.    Marland Kitchen losartan (COZAAR) 50 MG tablet Take 50 mg by mouth every morning.    . Melatonin 5 MG TABS Take 5 mg by mouth at bedtime.    . metoprolol succinate (TOPROL-XL) 50 MG 24 hr tablet Take 50 mg by mouth daily.    . nitroGLYCERIN (NITROSTAT)  0.4 MG SL tablet Place 0.4 mg under the tongue every 5 (five) minutes x 3 doses as needed for chest pain.     Marland Kitchen ondansetron (ZOFRAN) 8 MG tablet Take 1 tablet (8 mg total) by mouth 2 (two) times daily as needed for refractory nausea / vomiting. Start on day 2 after bendamustine chemo (Patient not taking: Reported on 12/25/2016) 30 tablet 1  . potassium chloride SA (K-DUR,KLOR-CON) 20 MEQ tablet Take 1 tablet (20 mEq total) by mouth daily. (Patient not taking: Reported on 12/25/2016) 14 tablet 0  . prochlorperazine (COMPAZINE) 10 MG tablet Take 1 tablet (10 mg total) by mouth every 6 (six) hours as needed for nausea or vomiting. (Patient not taking: Reported on 12/25/2016) 30 tablet 0  . timolol (TIMOPTIC) 0.5 % ophthalmic solution Place 1 drop into both eyes. Instill 1 drop in each eye in the morning and one drop in each eye at night    . traMADol (ULTRAM) 50 MG tablet Take 1-2 tablets (50-100 mg total) by mouth every 6 (six) hours as needed. 30 tablet 1   No current facility-administered medications for this visit.     REVIEW OF SYSTEMS:    A 10+ POINT REVIEW OF SYSTEMS WAS OBTAINED including neurology, dermatology, psychiatry, cardiac, respiratory, lymph, extremities, GI, GU, Musculoskeletal, constitutional, breasts, reproductive, HEENT.  All pertinent positives are noted in the HPI.  All others are negative.   PHYSICAL EXAMINATION:  ECOG PERFORMANCE STATUS: 1 - Symptomatic but completely ambulatory Vital signs recorded in the EMR, reviewed.  GENERAL:alert, in no acute distress and comfortable SKIN: no acute rashes, no significant lesions EYES: conjunctiva are pink and non-injected, sclera anicteric OROPHARYNX: MMM, no exudates, no oropharyngeal erythema or ulceration NECK: supple, no JVD LYMPH:  no palpable lymphadenopathy in the cervical, axillary or inguinal regions LUNGS: clear to auscultation b/l with normal respiratory effort HEART: regular rate & rhythm ABDOMEN:  normoactive bowel  sounds , non tender, not distended. Extremity: no pedal edema PSYCH: alert & oriented x 3 with fluent speech NEURO: no focal motor/sensory deficits   LABORATORY DATA:  I have reviewed the data as listed  . CBC Latest Ref Rng & Units 09/02/2017 05/17/2017 03/04/2017  WBC 3.9 - 10.3 K/uL 8.2 5.7 7.2  Hemoglobin 11.6 - 15.9 g/dL 13.4 13.2 12.7  Hematocrit 34.8 - 46.6 % 41.6 40.1 39.8  Platelets 145 - 400 K/uL 171 188 180   . CBC    Component Value Date/Time   WBC 8.2 09/02/2017 0847   WBC 7.2 03/04/2017 1349   WBC 9.8 01/23/2016 1023   RBC 4.25 09/02/2017 0847   RBC 4.25 09/02/2017 0847   HGB 13.4 09/02/2017 0847   HGB 12.7 03/04/2017 1349   HCT 41.6 09/02/2017 0847   HCT 39.8 03/04/2017 1349   PLT 171 09/02/2017 0847   PLT 180 03/04/2017 1349   MCV 97.9 09/02/2017  0847   MCV 99.0 03/04/2017 1349   MCH 31.5 09/02/2017 0847   MCHC 32.2 09/02/2017 0847   RDW 13.7 09/02/2017 0847   RDW 12.9 03/04/2017 1349   LYMPHSABS 1.8 09/02/2017 0847   LYMPHSABS 1.9 03/04/2017 1349   MONOABS 0.6 09/02/2017 0847   MONOABS 0.6 03/04/2017 1349   EOSABS 0.3 09/02/2017 0847   EOSABS 0.2 03/04/2017 1349   BASOSABS 0.1 09/02/2017 0847   BASOSABS 0.0 03/04/2017 1349    . CMP Latest Ref Rng & Units 09/02/2017 05/17/2017 03/04/2017  Glucose 70 - 140 mg/dL 102 102 119  BUN 7 - 26 mg/dL 15 13 15.6  Creatinine 0.60 - 1.10 mg/dL 0.90 0.83 0.9  Sodium 136 - 145 mmol/L 142 141 144  Potassium 3.5 - 5.1 mmol/L 4.4 4.0 4.8  Chloride 98 - 109 mmol/L 105 106 -  CO2 22 - 29 mmol/L 29 28 30(H)  Calcium 8.4 - 10.4 mg/dL 9.3 9.5 9.1  Total Protein 6.4 - 8.3 g/dL 6.8 6.8 6.8  Total Bilirubin 0.2 - 1.2 mg/dL 0.3 0.5 0.29  Alkaline Phos 40 - 150 U/L 66 64 64  AST 5 - 34 U/L 16 15 14   ALT 0 - 55 U/L 11 10 10    . Lab Results  Component Value Date   LDH 220 05/17/2017    .          RADIOGRAPHIC STUDIES: I have personally reviewed the radiological images as listed and agreed with the findings  in the report. No results found.  PET/CT 12/22/2015  IMPRESSION: 1. Hypermetabolic confluent lower retroperitoneal / bilateral common iliac, right external iliac and right inguinal lymphadenopathy, consistent with lymphoma. 2. Solitary focus of osseous hypermetabolism in the left upper sacrum consistent with osseous involvement by lymphoma. 3. No metabolically active lymphoma in the neck or chest. Spleen is normal in size and metabolism. 4. Additional findings include aortic atherosclerosis, three-vessel coronary atherosclerosis and mild sigmoid diverticulosis.   Electronically Signed   By: Ilona Sorrel M.D.   On: 12/22/2015 17:21   ASSESSMENT & PLAN:   77 year old Caucasian female with multiple chronic medical comorbidities as noted above with   #1 Stage IVB Low grade Follicular Lymphoma with extensive retroperitoneal lymphadenopathy and right inguinal lymphadenopathy as well as osseous involvement of the sacrum. LDH level within normal limits.  S/p 5 cycles of BR (Bendamustine 70mg /m2)  PET CT scan after 5 cycles of Bendamustine and Rituxan done on 09/06/2016 shows no residual metabolic active disease and no evidence of disease progression.  CT C/A/P on 05/20/17 reveals no radiographic evidence of disease progression.   #2 Rt inguinal LN biopsy site infection and seroma -now resolved Plan -previously discussed pro's vs con's of maintenance Rituxan - patient had and continues to decline this option. -infection prevention strategies. -Pt has no clinical, radiographic or lab evidence of disease progression at this time.  -Discussed pt labwork today, 09/02/17; blood counts and chemistries are all normal. -Recommend warm compresses and continuing follow up with Dr Terrence Dupont -Counseled pt on smoking cessation, pt is very sincere in her desire to not stop smoking -Will see pt back in 6 months with repeat CT imaging   CT chest/abd/pelvis in 24 weeks RTC with Dr Irene Limbo in 6 months  with labs    All of the patients , her friends questions were answered to their apparent satisfaction. The patient knows to call the clinic with any problems, questions or concerns.  The toal time spent in the appt was 20 minutes  and more than 50% was on counseling and direct patient cares.    Sullivan Lone MD [place] AAHIVMS [facility] Hematology/Oncology Physician [facility]  (Office):       [phone] (Work cell):  [phone] (Fax):           [phone]  I, Baldwin [place], am acting as a scribe for Dr Irene Limbo.   .I have reviewed the above documentation for accuracy and completeness, and I agree with the above. Brunetta Genera MD

## 2017-09-02 ENCOUNTER — Inpatient Hospital Stay: Payer: PPO | Attending: Hematology | Admitting: Hematology

## 2017-09-02 ENCOUNTER — Inpatient Hospital Stay: Payer: PPO

## 2017-09-02 ENCOUNTER — Telehealth: Payer: Self-pay

## 2017-09-02 VITALS — BP 130/43 | HR 58 | Temp 98.6°F | Resp 17 | Ht 70.0 in | Wt 207.3 lb

## 2017-09-02 DIAGNOSIS — C8298 Follicular lymphoma, unspecified, lymph nodes of multiple sites: Secondary | ICD-10-CM

## 2017-09-02 DIAGNOSIS — Z8572 Personal history of non-Hodgkin lymphomas: Secondary | ICD-10-CM | POA: Insufficient documentation

## 2017-09-02 DIAGNOSIS — I10 Essential (primary) hypertension: Secondary | ICD-10-CM | POA: Insufficient documentation

## 2017-09-02 DIAGNOSIS — L0202 Furuncle of face: Secondary | ICD-10-CM | POA: Diagnosis not present

## 2017-09-02 DIAGNOSIS — F17210 Nicotine dependence, cigarettes, uncomplicated: Secondary | ICD-10-CM

## 2017-09-02 DIAGNOSIS — E119 Type 2 diabetes mellitus without complications: Secondary | ICD-10-CM | POA: Insufficient documentation

## 2017-09-02 LAB — CMP (CANCER CENTER ONLY)
ALBUMIN: 3.6 g/dL (ref 3.5–5.0)
ALT: 11 U/L (ref 0–55)
ANION GAP: 8 (ref 3–11)
AST: 16 U/L (ref 5–34)
Alkaline Phosphatase: 66 U/L (ref 40–150)
BUN: 15 mg/dL (ref 7–26)
CO2: 29 mmol/L (ref 22–29)
Calcium: 9.3 mg/dL (ref 8.4–10.4)
Chloride: 105 mmol/L (ref 98–109)
Creatinine: 0.9 mg/dL (ref 0.60–1.10)
GFR, Est AFR Am: >60 mL/min (ref >60)
GFR, Estimated: >60 mL/min (ref >60)
GLUCOSE: 102 mg/dL (ref 70–140)
POTASSIUM: 4.4 mmol/L (ref 3.5–5.1)
SODIUM: 142 mmol/L (ref 136–145)
Total Bilirubin: 0.3 mg/dL (ref 0.2–1.2)
Total Protein: 6.8 g/dL (ref 6.4–8.3)

## 2017-09-02 LAB — CBC WITH DIFFERENTIAL (CANCER CENTER ONLY)
Basophils Absolute: 0.1 10*3/uL (ref 0.0–0.1)
Basophils Relative: 1 %
Eosinophils Absolute: 0.3 10*3/uL (ref 0.0–0.5)
Eosinophils Relative: 3 %
HCT: 41.6 % (ref 34.8–46.6)
HEMOGLOBIN: 13.4 g/dL (ref 11.6–15.9)
LYMPHS ABS: 1.8 10*3/uL (ref 0.9–3.3)
Lymphocytes Relative: 22 %
MCH: 31.5 pg (ref 25.1–34.0)
MCHC: 32.2 g/dL (ref 31.5–36.0)
MCV: 97.9 fL (ref 79.5–101.0)
MONOS PCT: 8 %
Monocytes Absolute: 0.6 10*3/uL (ref 0.1–0.9)
NEUTROS ABS: 5.4 10*3/uL (ref 1.5–6.5)
NEUTROS PCT: 66 %
Platelet Count: 171 10*3/uL (ref 145–400)
RBC: 4.25 MIL/uL (ref 3.70–5.45)
RDW: 13.7 % (ref 11.2–14.5)
WBC Count: 8.2 10*3/uL (ref 3.9–10.3)

## 2017-09-02 LAB — LACTATE DEHYDROGENASE: LDH: 208 U/L (ref 125–245)

## 2017-09-02 LAB — RETICULOCYTES
RBC.: 4.25 MIL/uL (ref 3.70–5.45)
Retic Count, Absolute: 46.8 10*3/uL (ref 33.7–90.7)
Retic Ct Pct: 1.1 % (ref 0.7–2.1)

## 2017-09-02 NOTE — Telephone Encounter (Signed)
Printed patient avs and calender of upcoming appointment an also gave her contrast with number/instruction. P[er 6/17 los

## 2017-09-16 DIAGNOSIS — L720 Epidermal cyst: Secondary | ICD-10-CM | POA: Diagnosis not present

## 2017-09-16 DIAGNOSIS — L570 Actinic keratosis: Secondary | ICD-10-CM | POA: Diagnosis not present

## 2017-09-17 DIAGNOSIS — M461 Sacroiliitis, not elsewhere classified: Secondary | ICD-10-CM | POA: Diagnosis not present

## 2017-09-17 DIAGNOSIS — M9901 Segmental and somatic dysfunction of cervical region: Secondary | ICD-10-CM | POA: Diagnosis not present

## 2017-09-17 DIAGNOSIS — M62838 Other muscle spasm: Secondary | ICD-10-CM | POA: Diagnosis not present

## 2017-09-17 DIAGNOSIS — M9903 Segmental and somatic dysfunction of lumbar region: Secondary | ICD-10-CM | POA: Diagnosis not present

## 2017-09-17 DIAGNOSIS — M542 Cervicalgia: Secondary | ICD-10-CM | POA: Diagnosis not present

## 2017-09-17 DIAGNOSIS — M9904 Segmental and somatic dysfunction of sacral region: Secondary | ICD-10-CM | POA: Diagnosis not present

## 2017-09-17 DIAGNOSIS — M545 Low back pain: Secondary | ICD-10-CM | POA: Diagnosis not present

## 2017-09-27 IMAGING — US US BIOPSY
1 series · 10 of 10 positions shown · non-contrast
Comparison: none

CLINICAL DATA: Retroperitoneal lymphadenopathy in the abdomen and
pelvis. CT also demonstrates an abnormal appearing lymph node in the
medial right inguinal region. The patient presents for core biopsy
of the right inguinal node.

[Series 1: us biopsy · 0.07mm/px · 10 of 10 slices shown]
[im 1/10]
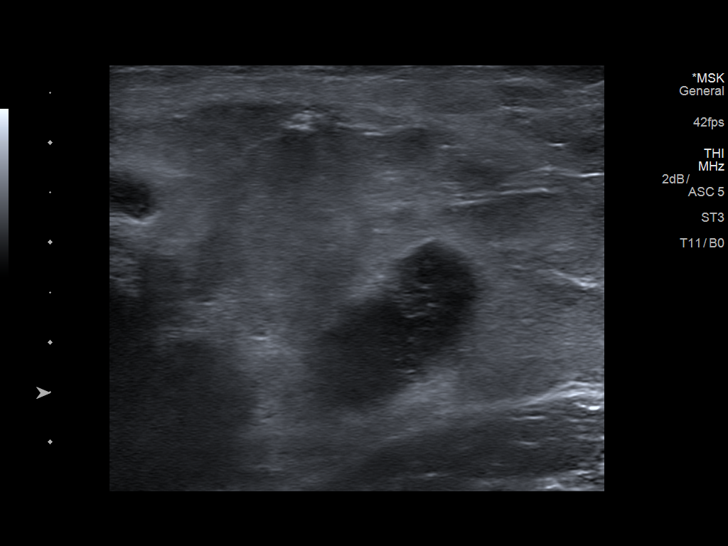
[im 2/10]
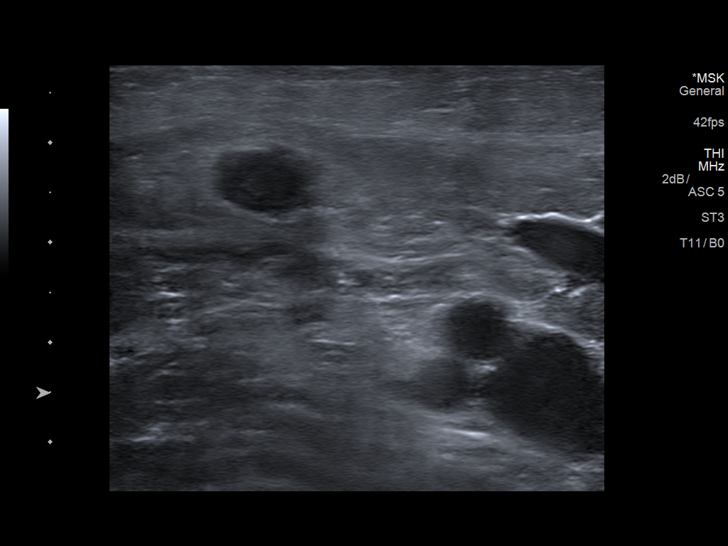
[im 3/10]
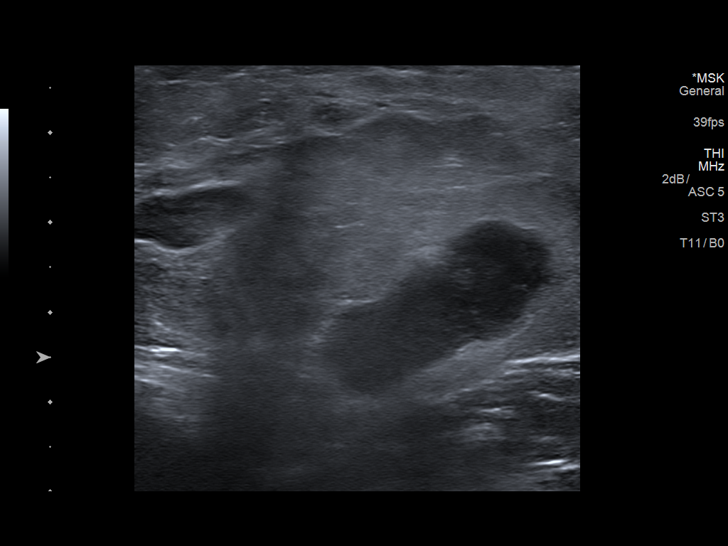
[im 4/10]
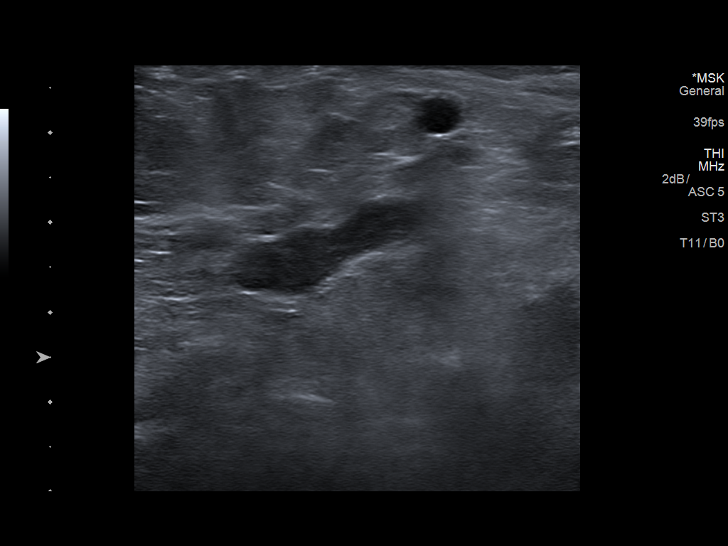
[im 5/10]
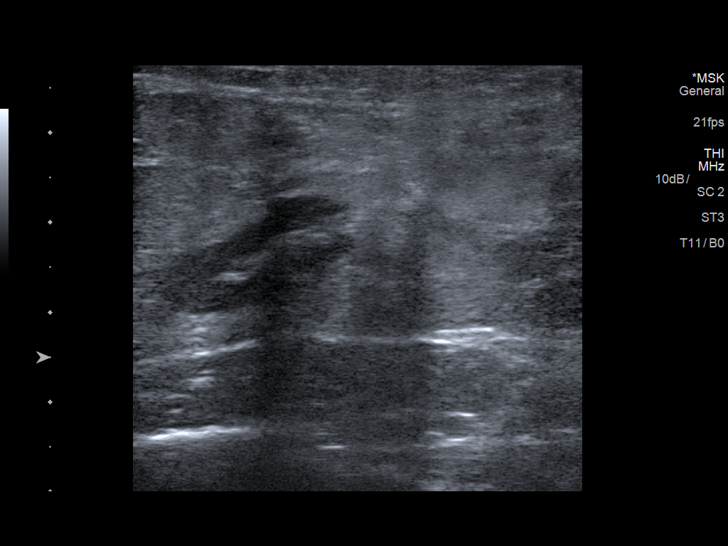
[im 6/10]
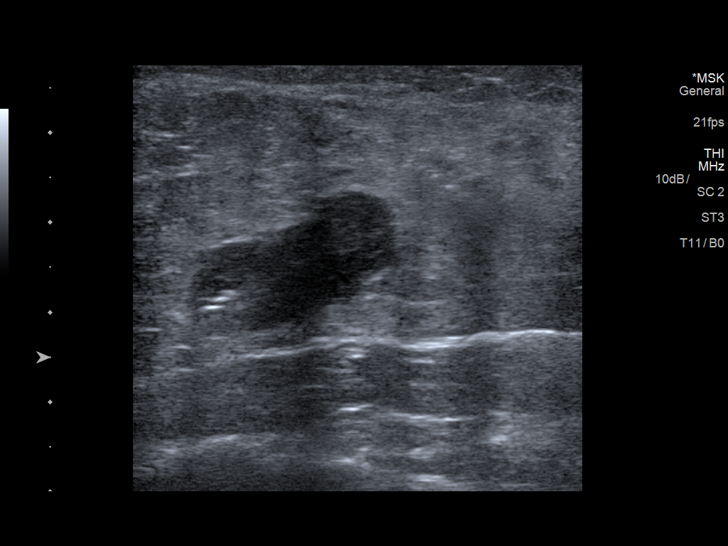
[im 7/10]
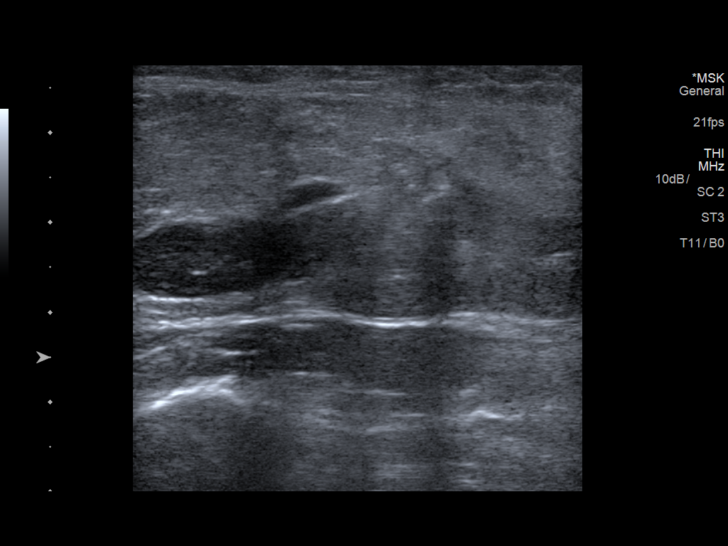
[im 8/10]
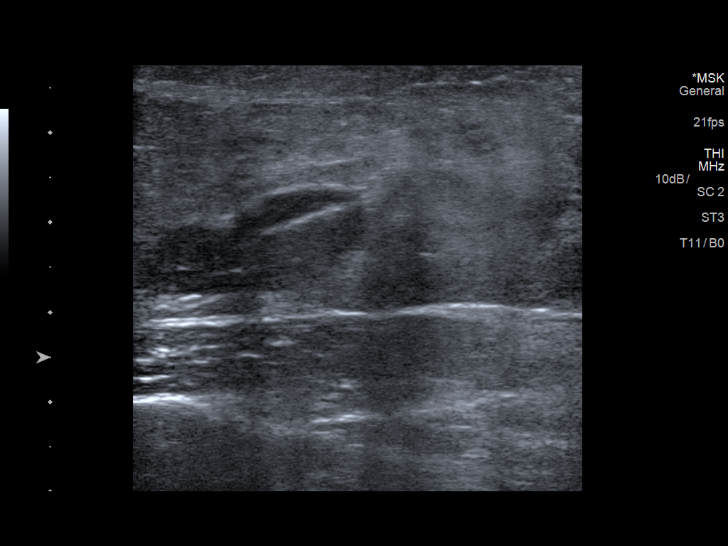
[im 9/10]
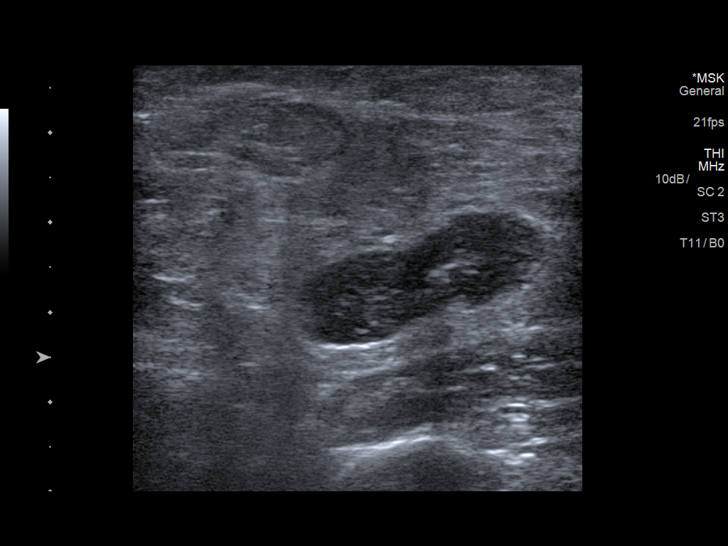
[im 10/10]
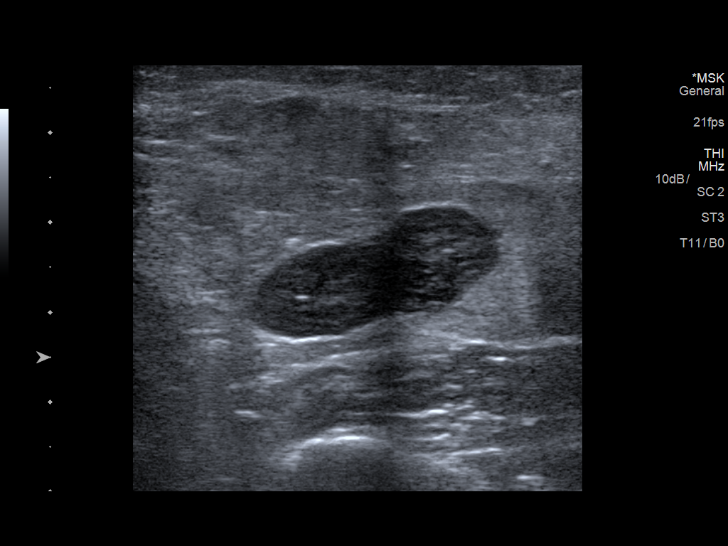

[10 of 10 positions shown; findings below may reference images not displayed]

EXAM:
ULTRASOUND GUIDED CORE BIOPSY OF RIGHT INGUINAL LYMPH NODE

MEDICATIONS:
None

PROCEDURE:
The procedure, risks, benefits, and alternatives were explained to
the patient. Questions regarding the procedure were encouraged and
answered. The patient understands and consents to the procedure. A
time out was performed prior to initiating the procedure.

The right groin was prepped with chlorhexidine in a sterile fashion,
and a sterile drape was applied covering the operative field. A
sterile gown and sterile gloves were used for the procedure. Local
anesthesia was provided with 1% Lidocaine.

Ultrasound was used to localize an enlarged right inguinal lymph
node. Under direct ultrasound guidance, 4 separate 16 gauge core
biopsy samples were obtained. The samples were submitted in saline.

COMPLICATIONS:
None.
FINDINGS: Elongated lymph node in the medial right inguinal region corresponds
to the CT abnormality and measures approximately 2.8 cm in greatest
length. This lymph node demonstrates abnormal morphology by
ultrasound. Solid tissue was obtained.
IMPRESSION: Ultrasound-guided core biopsy performed of an enlarged right
inguinal lymph node.

## 2017-09-30 DIAGNOSIS — M9903 Segmental and somatic dysfunction of lumbar region: Secondary | ICD-10-CM | POA: Diagnosis not present

## 2017-09-30 DIAGNOSIS — M9904 Segmental and somatic dysfunction of sacral region: Secondary | ICD-10-CM | POA: Diagnosis not present

## 2017-09-30 DIAGNOSIS — M545 Low back pain: Secondary | ICD-10-CM | POA: Diagnosis not present

## 2017-09-30 DIAGNOSIS — M9901 Segmental and somatic dysfunction of cervical region: Secondary | ICD-10-CM | POA: Diagnosis not present

## 2017-09-30 DIAGNOSIS — M461 Sacroiliitis, not elsewhere classified: Secondary | ICD-10-CM | POA: Diagnosis not present

## 2017-09-30 DIAGNOSIS — M62838 Other muscle spasm: Secondary | ICD-10-CM | POA: Diagnosis not present

## 2017-09-30 DIAGNOSIS — M542 Cervicalgia: Secondary | ICD-10-CM | POA: Diagnosis not present

## 2017-10-03 DIAGNOSIS — M62838 Other muscle spasm: Secondary | ICD-10-CM | POA: Diagnosis not present

## 2017-10-03 DIAGNOSIS — M545 Low back pain: Secondary | ICD-10-CM | POA: Diagnosis not present

## 2017-10-03 DIAGNOSIS — M461 Sacroiliitis, not elsewhere classified: Secondary | ICD-10-CM | POA: Diagnosis not present

## 2017-10-03 DIAGNOSIS — M9903 Segmental and somatic dysfunction of lumbar region: Secondary | ICD-10-CM | POA: Diagnosis not present

## 2017-10-03 DIAGNOSIS — M542 Cervicalgia: Secondary | ICD-10-CM | POA: Diagnosis not present

## 2017-10-03 DIAGNOSIS — M9901 Segmental and somatic dysfunction of cervical region: Secondary | ICD-10-CM | POA: Diagnosis not present

## 2017-10-03 DIAGNOSIS — M9904 Segmental and somatic dysfunction of sacral region: Secondary | ICD-10-CM | POA: Diagnosis not present

## 2017-10-07 DIAGNOSIS — M461 Sacroiliitis, not elsewhere classified: Secondary | ICD-10-CM | POA: Diagnosis not present

## 2017-10-07 DIAGNOSIS — L720 Epidermal cyst: Secondary | ICD-10-CM | POA: Diagnosis not present

## 2017-10-07 DIAGNOSIS — M9904 Segmental and somatic dysfunction of sacral region: Secondary | ICD-10-CM | POA: Diagnosis not present

## 2017-10-07 DIAGNOSIS — M545 Low back pain: Secondary | ICD-10-CM | POA: Diagnosis not present

## 2017-10-07 DIAGNOSIS — M542 Cervicalgia: Secondary | ICD-10-CM | POA: Diagnosis not present

## 2017-10-07 DIAGNOSIS — L578 Other skin changes due to chronic exposure to nonionizing radiation: Secondary | ICD-10-CM | POA: Diagnosis not present

## 2017-10-07 DIAGNOSIS — M62838 Other muscle spasm: Secondary | ICD-10-CM | POA: Diagnosis not present

## 2017-10-07 DIAGNOSIS — M9903 Segmental and somatic dysfunction of lumbar region: Secondary | ICD-10-CM | POA: Diagnosis not present

## 2017-10-07 DIAGNOSIS — M9901 Segmental and somatic dysfunction of cervical region: Secondary | ICD-10-CM | POA: Diagnosis not present

## 2017-10-10 DIAGNOSIS — M542 Cervicalgia: Secondary | ICD-10-CM | POA: Diagnosis not present

## 2017-10-10 DIAGNOSIS — M9901 Segmental and somatic dysfunction of cervical region: Secondary | ICD-10-CM | POA: Diagnosis not present

## 2017-10-10 DIAGNOSIS — M9904 Segmental and somatic dysfunction of sacral region: Secondary | ICD-10-CM | POA: Diagnosis not present

## 2017-10-10 DIAGNOSIS — M9903 Segmental and somatic dysfunction of lumbar region: Secondary | ICD-10-CM | POA: Diagnosis not present

## 2017-10-10 DIAGNOSIS — M62838 Other muscle spasm: Secondary | ICD-10-CM | POA: Diagnosis not present

## 2017-10-10 DIAGNOSIS — M545 Low back pain: Secondary | ICD-10-CM | POA: Diagnosis not present

## 2017-10-10 DIAGNOSIS — M461 Sacroiliitis, not elsewhere classified: Secondary | ICD-10-CM | POA: Diagnosis not present

## 2017-10-28 ENCOUNTER — Other Ambulatory Visit: Payer: Self-pay | Admitting: *Deleted

## 2017-10-28 NOTE — Patient Outreach (Signed)
[place] [facility]) Care Management  10/28/2017  CORINNE GOUCHER August 24, 1940 [id number]    Member identified as high risk according to [employer] health questionnaire.  Call placed to introduce [facility] care management services and perform telephone screening.  This care manager introduced self and purpose of call.  Screening complete, member denies any needs at this time, will not open case.  Valente David, [place], MSN [place] [phone]

## 2017-10-30 DIAGNOSIS — F17290 Nicotine dependence, other tobacco product, uncomplicated: Secondary | ICD-10-CM | POA: Diagnosis not present

## 2017-10-30 DIAGNOSIS — I252 Old myocardial infarction: Secondary | ICD-10-CM | POA: Diagnosis not present

## 2017-10-30 DIAGNOSIS — M1990 Unspecified osteoarthritis, unspecified site: Secondary | ICD-10-CM | POA: Diagnosis not present

## 2017-10-30 DIAGNOSIS — I2510 Atherosclerotic heart disease of native coronary artery without angina pectoris: Secondary | ICD-10-CM | POA: Diagnosis not present

## 2017-10-30 DIAGNOSIS — E119 Type 2 diabetes mellitus without complications: Secondary | ICD-10-CM | POA: Diagnosis not present

## 2017-10-30 DIAGNOSIS — E785 Hyperlipidemia, unspecified: Secondary | ICD-10-CM | POA: Diagnosis not present

## 2017-10-30 DIAGNOSIS — I10 Essential (primary) hypertension: Secondary | ICD-10-CM | POA: Diagnosis not present

## 2017-10-30 DIAGNOSIS — I480 Paroxysmal atrial fibrillation: Secondary | ICD-10-CM | POA: Diagnosis not present

## 2017-11-21 DIAGNOSIS — H40153 Residual stage of open-angle glaucoma, bilateral: Secondary | ICD-10-CM | POA: Diagnosis not present

## 2018-02-05 DIAGNOSIS — M1990 Unspecified osteoarthritis, unspecified site: Secondary | ICD-10-CM | POA: Diagnosis not present

## 2018-02-05 DIAGNOSIS — I10 Essential (primary) hypertension: Secondary | ICD-10-CM | POA: Diagnosis not present

## 2018-02-05 DIAGNOSIS — F17290 Nicotine dependence, other tobacco product, uncomplicated: Secondary | ICD-10-CM | POA: Diagnosis not present

## 2018-02-05 DIAGNOSIS — I480 Paroxysmal atrial fibrillation: Secondary | ICD-10-CM | POA: Diagnosis not present

## 2018-02-05 DIAGNOSIS — E785 Hyperlipidemia, unspecified: Secondary | ICD-10-CM | POA: Diagnosis not present

## 2018-02-11 ENCOUNTER — Telehealth: Payer: Self-pay | Admitting: *Deleted

## 2018-02-11 NOTE — Telephone Encounter (Signed)
Error

## 2018-02-18 ENCOUNTER — Other Ambulatory Visit: Payer: Self-pay | Admitting: *Deleted

## 2018-02-18 ENCOUNTER — Telehealth: Payer: Self-pay | Admitting: *Deleted

## 2018-02-18 ENCOUNTER — Inpatient Hospital Stay: Payer: PPO | Attending: Hematology

## 2018-02-18 ENCOUNTER — Ambulatory Visit (HOSPITAL_COMMUNITY)
Admission: RE | Admit: 2018-02-18 | Discharge: 2018-02-18 | Disposition: A | Discharge status: Home / Self Care | Payer: PPO | Source: Ambulatory Visit | Attending: Hematology | Admitting: Hematology

## 2018-02-18 DIAGNOSIS — F17210 Nicotine dependence, cigarettes, uncomplicated: Secondary | ICD-10-CM | POA: Insufficient documentation

## 2018-02-18 DIAGNOSIS — K5730 Diverticulosis of large intestine without perforation or abscess without bleeding: Secondary | ICD-10-CM | POA: Diagnosis not present

## 2018-02-18 DIAGNOSIS — C8298 Follicular lymphoma, unspecified, lymph nodes of multiple sites: Secondary | ICD-10-CM | POA: Insufficient documentation

## 2018-02-18 DIAGNOSIS — I10 Essential (primary) hypertension: Secondary | ICD-10-CM | POA: Diagnosis not present

## 2018-02-18 DIAGNOSIS — I700 Atherosclerosis of aorta: Secondary | ICD-10-CM | POA: Diagnosis not present

## 2018-02-18 DIAGNOSIS — Z79899 Other long term (current) drug therapy: Secondary | ICD-10-CM | POA: Diagnosis not present

## 2018-02-18 DIAGNOSIS — M1612 Unilateral primary osteoarthritis, left hip: Secondary | ICD-10-CM | POA: Diagnosis not present

## 2018-02-18 DIAGNOSIS — Z23 Encounter for immunization: Secondary | ICD-10-CM

## 2018-02-18 LAB — COMPREHENSIVE METABOLIC PANEL
ALK PHOS: 71 U/L (ref 38–126)
ALT: 13 U/L (ref 0–44)
AST: 18 U/L (ref 15–41)
Albumin: 3.9 g/dL (ref 3.5–5.0)
Anion gap: 8 (ref 5–15)
BILIRUBIN TOTAL: 0.5 mg/dL (ref 0.3–1.2)
BUN: 11 mg/dL (ref 8–23)
CALCIUM: 9.6 mg/dL (ref 8.9–10.3)
CO2: 30 mmol/L (ref 22–32)
CREATININE: 0.91 mg/dL (ref 0.44–1.00)
Chloride: 105 mmol/L (ref 98–111)
GFR calc Af Amer: >60 mL/min (ref >60)
Glucose, Bld: 106 mg/dL — ABNORMAL HIGH (ref 70–99)
Potassium: 4.9 mmol/L (ref 3.5–5.1)
Sodium: 143 mmol/L (ref 135–145)
TOTAL PROTEIN: 7 g/dL (ref 6.5–8.1)

## 2018-02-18 LAB — CBC WITH DIFFERENTIAL (CANCER CENTER ONLY)
Abs Immature Granulocytes: 0.02 10*3/uL (ref 0.00–0.07)
Basophils Absolute: 0.1 10*3/uL (ref 0.0–0.1)
Basophils Relative: 1 %
Eosinophils Absolute: 0.2 10*3/uL (ref 0.0–0.5)
Eosinophils Relative: 2 %
HEMATOCRIT: 43.8 % (ref 36.0–46.0)
Hemoglobin: 13.9 g/dL (ref 12.0–15.0)
Immature Granulocytes: 0 %
Lymphocytes Relative: 24 %
Lymphs Abs: 2.1 10*3/uL (ref 0.7–4.0)
MCH: 31.2 pg (ref 26.0–34.0)
MCHC: 31.7 g/dL (ref 30.0–36.0)
MCV: 98.2 fL (ref 80.0–100.0)
Monocytes Absolute: 0.7 10*3/uL (ref 0.1–1.0)
Monocytes Relative: 8 %
Neutro Abs: 5.8 10*3/uL (ref 1.7–7.7)
Neutrophils Relative %: 65 %
Platelet Count: 196 10*3/uL (ref 150–400)
RBC: 4.46 MIL/uL (ref 3.87–5.11)
RDW: 12.8 % (ref 11.5–15.5)
WBC Count: 8.9 10*3/uL (ref 4.0–10.5)
nRBC: 0 % (ref 0.0–0.2)

## 2018-02-18 MED ORDER — IOHEXOL 300 MG/ML  SOLN
100.0000 mL | Freq: Once | INTRAMUSCULAR | Status: AC | PRN
  Administered 2018-02-18: 100 mL via INTRAVENOUS

## 2018-02-18 MED ORDER — SODIUM CHLORIDE (PF) 0.9 % IJ SOLN
INTRAMUSCULAR | Status: AC
  Filled 2018-02-18: qty 50

## 2018-02-18 NOTE — Telephone Encounter (Signed)
Notified by CT staff that current labs were needed prior to patient's CT scan today at 12:30. Appt made for lab at 11:45 for today. Contacted patient to inform her. Patient verbalized understanding of time for lab appt before CT.

## 2018-02-28 NOTE — Progress Notes (Signed)
HEMATOLOGY/ONCOLOGY CLINIC NOTE  Date of Service: 09/02/17   Patient Care Team: Charolette Forward, MD as PCP - General (Cardiology) Physical medicine and rehabilitation: Sharlet Salina DO  CHIEF COMPLAINTS/PURPOSE OF CONSULTATION:  F/u for follicular lymphoma  INTERVAL HISTORY  Samantha [name] is here for f/u of her follicular lymphoma. The\ patient's last visit with [place] was on 09/02/17. The pt reports that she is doing well overall.   The pt reports that she has not developed any new concerns in the interim. She endorses good energy levels. She notes that she "doesn't get hungry like [she] used to." She denies any fevers, chills, night sweats, or unexpected weight loss.   The pt notes that she noticed a small knot on the inside of her right wrist about 2-3 weeks ago, and denies any pain associated. She notes that her PCP told her this is likely a ganglion cyst. The pt denies noticing any other new lumps or bumps.   Of note since the patient's last visit, pt has had a CT C/A/P completed on 02/19/18 with results revealing  Stable mild retroperitoneal adenopathy, compatible with treated lymphoma. No new or progressive adenopathy. No new sites of disease. Normal size spleen. 2. Three-vessel coronary atherosclerosis. 3. Aortic Atherosclerosis.  Lab results today (03/03/18) of CBC w/diff is as follows: all values are WNL 03/03/18 CMP is pending 03/03/18 LDH is pending   On review of systems, pt reports good energy levels, somewhat weaker appetite, ganglion cyst on right inner wrist, and denies fevers, chills, night sweats, unexpected weight loss, noticing any new lumps or bumps, abdominal pains, leg swelling, and any other symptoms.    MEDICAL HISTORY:  Past Medical History:  Diagnosis Date  . A-fib ([place])   . Arthritis   . Chronic back pain   . COPD (chronic obstructive pulmonary disease) ([place])   . Coronary artery disease   . Glaucoma   . Hearing aid worn   . High cholesterol   .  Hypertension   . Lymphadenopathy 01/25/2016  . Myocardial infarction ([place])    2000  . Non Hodgkin's lymphoma ([place])   . Wears dentures    " Top plate"  . Wears glasses   #1 bilateral cataracts #2 COPD  #3 active smoker half a pack per day for about 60 years #4 degenerative arthritis - has had chronic back pain issues with multiple epidural steroid injections from 2014 [5 times] #5 glaucoma #6 degenerative arthritis #7 history of TIA #8 dyslipidemia #9 hypertension #10 coronary artery disease with inferior wall MI in the past, status post PCI to RCA in 2001 #11 hypothyroidism #12 lumbar radiculopathy/neuropathy. #13 paroxysmal atrial fibrillation on anticoagulation with Eliquis. #14 type 2 diabetes #16 obesity  SURGICAL HISTORY:  #1 cardiac catheterization #2 cholecystectomy #3 colonoscopy #4 wisdom teeth extraction.  SOCIAL HISTORY: Currently single. Notes that she lost her female partner 9 months ago. Active smoker half pack per day since her teens  FAMILY HISTORY: Patient reports no history of cancers or blood disorders in the family that she is aware of.  ALLERGIES:  is allergic to hydrocodone and oxycodone.  MEDICATIONS:  Current Outpatient Medications  Medication Sig Dispense Refill  . ALPRAZolam (XANAX) 0.25 MG tablet Take 0.25 mg by mouth daily as needed for anxiety.     Marland Kitchen amiodarone (PACERONE) 200 MG tablet Take 100 mg by mouth every morning.    Marland Kitchen atorvastatin (LIPITOR) 40 MG tablet Take 40 mg by mouth every morning.     Marland Kitchen  Bimatoprost (LUMIGAN OP) Place 1 drop into both eyes at bedtime.    Marland Kitchen dexamethasone (DECADRON) 4 MG tablet Take 2 tablets (8 mg total) by mouth daily. Start the day after bendamustine chemotherapy for 2 days. Take with food. (Patient not taking: Reported on 12/25/2016) 30 tablet 1  . doxycycline (VIBRA-TABS) 100 MG tablet Take 1 tablet (100 mg total) by mouth 2 (two) times daily. (Patient not taking: Reported on 03/04/2017) 14 tablet 0  .  ELIQUIS 5 MG TABS tablet Take 5 mg by mouth 2 (two) times daily.    Marland Kitchen gabapentin (NEURONTIN) 300 MG capsule Take 300 mg by mouth 2 (two) times daily.    Marland Kitchen losartan (COZAAR) 50 MG tablet Take 50 mg by mouth every morning.    . Melatonin 5 MG TABS Take 5 mg by mouth at bedtime.    . metoprolol succinate (TOPROL-XL) 50 MG 24 hr tablet Take 50 mg by mouth daily.    . nitroGLYCERIN (NITROSTAT) 0.4 MG SL tablet Place 0.4 mg under the tongue every 5 (five) minutes x 3 doses as needed for chest pain.     Marland Kitchen ondansetron (ZOFRAN) 8 MG tablet Take 1 tablet (8 mg total) by mouth 2 (two) times daily as needed for refractory nausea / vomiting. Start on day 2 after bendamustine chemo (Patient not taking: Reported on 12/25/2016) 30 tablet 1  . potassium chloride SA (K-DUR,KLOR-CON) 20 MEQ tablet Take 1 tablet (20 mEq total) by mouth daily. (Patient not taking: Reported on 12/25/2016) 14 tablet 0  . prochlorperazine (COMPAZINE) 10 MG tablet Take 1 tablet (10 mg total) by mouth every 6 (six) hours as needed for nausea or vomiting. (Patient not taking: Reported on 12/25/2016) 30 tablet 0  . timolol (TIMOPTIC) 0.5 % ophthalmic solution Place 1 drop into both eyes. Instill 1 drop in each eye in the morning and one drop in each eye at night    . traMADol (ULTRAM) 50 MG tablet Take 1-2 tablets (50-100 mg total) by mouth every 6 (six) hours as needed. 30 tablet 1   No current facility-administered medications for this visit.     REVIEW OF SYSTEMS:    A 10+ POINT REVIEW OF SYSTEMS WAS OBTAINED including neurology, dermatology, psychiatry, cardiac, respiratory, lymph, extremities, GI, GU, Musculoskeletal, constitutional, breasts, reproductive, HEENT.  All pertinent positives are noted in the HPI.  All others are negative.   PHYSICAL EXAMINATION:  ECOG PERFORMANCE STATUS: 1 - Symptomatic but completely ambulatory Vital signs recorded in the EMR, reviewed.  GENERAL:alert, in no acute distress and comfortable SKIN: no acute  rashes, no significant lesions EYES: conjunctiva are pink and non-injected, sclera anicteric OROPHARYNX: MMM, no exudates, no oropharyngeal erythema or ulceration NECK: supple, no JVD LYMPH:  no palpable lymphadenopathy in the cervical, axillary or inguinal regions LUNGS: clear to auscultation b/l with normal respiratory effort HEART: regular rate & rhythm ABDOMEN:  normoactive bowel sounds , non tender, not distended. No palpable hepatosplenomegaly.  Extremity: no pedal edema PSYCH: alert & oriented x 3 with fluent speech NEURO: no focal motor/sensory deficits   LABORATORY DATA:  I have reviewed the data as listed  . CBC Latest Ref Rng & Units 03/03/2018 02/18/2018 09/02/2017  WBC 4.0 - 10.5 K/uL 9.9 8.9 8.2  Hemoglobin 12.0 - 15.0 g/dL 12.9 13.9 13.4  Hematocrit 36.0 - 46.0 % 41.0 43.8 41.6  Platelets 150 - 400 K/uL 162 196 171   . CBC    Component Value Date/Time   WBC 9.9 03/03/2018 1351  RBC 4.17 03/03/2018 1351   HGB 12.9 03/03/2018 1351   HGB 13.9 02/18/2018 1116   HGB 12.7 03/04/2017 1349   HCT 41.0 03/03/2018 1351   HCT 39.8 03/04/2017 1349   PLT 162 03/03/2018 1351   PLT 196 02/18/2018 1116   PLT 180 03/04/2017 1349   MCV 98.3 03/03/2018 1351   MCV 99.0 03/04/2017 1349   MCH 30.9 03/03/2018 1351   MCHC 31.5 03/03/2018 1351   RDW 12.7 03/03/2018 1351   RDW 12.9 03/04/2017 1349   LYMPHSABS 1.9 03/03/2018 1351   LYMPHSABS 1.9 03/04/2017 1349   MONOABS 0.8 03/03/2018 1351   MONOABS 0.6 03/04/2017 1349   EOSABS 0.2 03/03/2018 1351   EOSABS 0.2 03/04/2017 1349   BASOSABS 0.0 03/03/2018 1351   BASOSABS 0.0 03/04/2017 1349    . CMP Latest Ref Rng & Units 03/03/2018 02/18/2018 09/02/2017  Glucose 70 - 99 mg/dL 143(H) 106(H) 102  BUN 8 - 23 mg/dL 13 11 15   Creatinine 0.44 - 1.00 mg/dL 0.85 0.91 0.90  Sodium 135 - 145 mmol/L 144 143 142  Potassium 3.5 - 5.1 mmol/L 3.7 4.9 4.4  Chloride 98 - 111 mmol/L 106 105 105  CO2 22 - 32 mmol/L 29 30 29   Calcium 8.9 -  10.3 mg/dL 8.6(L) 9.6 9.3  Total Protein 6.5 - 8.1 g/dL 6.4(L) 7.0 6.8  Total Bilirubin 0.3 - 1.2 mg/dL 0.4 0.5 0.3  Alkaline Phos 38 - 126 U/L 60 71 66  AST 15 - 41 U/L 14(L) 18 16  ALT 0 - 44 U/L 13 13 11    . Lab Results  Component Value Date   LDH 208 09/02/2017    .          RADIOGRAPHIC STUDIES: I have personally reviewed the radiological images as listed and agreed with the findings in the report. Ct Chest W Contrast  Result Date: 02/19/2018 CLINICAL DATA:  Follicular lymphoma status post chemotherapy completed 1 year prior. Restaging. EXAM: CT CHEST, ABDOMEN, AND PELVIS WITH CONTRAST TECHNIQUE: Multidetector CT imaging of the chest, abdomen and pelvis was performed following the standard protocol during bolus administration of intravenous contrast. CONTRAST:  194mL OMNIPAQUE IOHEXOL 300 MG/ML  SOLN COMPARISON:  05/20/2017 CT chest, abdomen and pelvis. 09/06/2016 PET-CT. FINDINGS: CT CHEST FINDINGS Cardiovascular: Normal heart size. No significant pericardial effusion/thickening. Three-vessel coronary atherosclerosis. Atherosclerotic nonaneurysmal thoracic aorta. Stable top-normal caliber main pulmonary artery (3.0 cm diameter). No central pulmonary emboli. Mediastinum/Nodes: No discrete thyroid nodules. Unremarkable esophagus. No pathologically enlarged axillary, mediastinal or hilar lymph nodes. Lungs/Pleura: No pneumothorax. No pleural effusion. No acute consolidative airspace disease, lung masses or significant pulmonary nodules. Musculoskeletal: No aggressive appearing focal osseous lesions. Severe degenerative disc disease in the midthoracic spine, unchanged. CT ABDOMEN PELVIS FINDINGS Hepatobiliary: Normal liver with no liver mass. Cholecystectomy. No biliary ductal dilatation. Pancreas: Normal, with no mass or duct dilation. Spleen: Normal size. No mass. Adrenals/Urinary Tract: Normal adrenals. Normal kidneys with no hydronephrosis and no renal mass. Normal bladder.  Stomach/Bowel: Normal non-distended stomach. Normal caliber small bowel with no small bowel wall thickening. Appendix. Oral contrast transits to the left colon. Moderate sigmoid diverticulosis, with no large bowel wall thickening or acute pericolonic fat stranding. Vascular/Lymphatic: Atherosclerotic nonaneurysmal abdominal aorta. Patent portal, splenic, hepatic and renal veins. Lobulated cystic structure in right inguinal region measures 2.8 x 1.9 cm (series 2/image 128), previously 3.3 x 1.9 cm, stable to sightly decreased, favor a chronic post procedure seroma. Mild left para-aortic adenopathy measures up to 1.5 cm (series  2/image 78), previously 1.6 cm, not appreciably changed. No additional pathologically enlarged lymph nodes in the abdomen or pelvis. Reproductive: Grossly normal uterus.  No adnexal mass. Other: No pneumoperitoneum, ascites or focal fluid collection. Musculoskeletal: No aggressive appearing focal osseous lesions. Marked lumbar spondylosis. IMPRESSION: 1. Stable mild retroperitoneal adenopathy, compatible with treated lymphoma. No new or progressive adenopathy. No new sites of disease. Normal size spleen. 2. Three-vessel coronary atherosclerosis. 3.  Aortic Atherosclerosis (ICD10-I70.0). Electronically Signed   By: Ilona Sorrel M.D.   On: 02/19/2018 08:40   Ct Abdomen Pelvis W Contrast  Result Date: 02/19/2018 CLINICAL DATA:  Follicular lymphoma status post chemotherapy completed 1 year prior. Restaging. EXAM: CT CHEST, ABDOMEN, AND PELVIS WITH CONTRAST TECHNIQUE: Multidetector CT imaging of the chest, abdomen and pelvis was performed following the standard protocol during bolus administration of intravenous contrast. CONTRAST:  177mL OMNIPAQUE IOHEXOL 300 MG/ML  SOLN COMPARISON:  05/20/2017 CT chest, abdomen and pelvis. 09/06/2016 PET-CT. FINDINGS: CT CHEST FINDINGS Cardiovascular: Normal heart size. No significant pericardial effusion/thickening. Three-vessel coronary atherosclerosis.  Atherosclerotic nonaneurysmal thoracic aorta. Stable top-normal caliber main pulmonary artery (3.0 cm diameter). No central pulmonary emboli. Mediastinum/Nodes: No discrete thyroid nodules. Unremarkable esophagus. No pathologically enlarged axillary, mediastinal or hilar lymph nodes. Lungs/Pleura: No pneumothorax. No pleural effusion. No acute consolidative airspace disease, lung masses or significant pulmonary nodules. Musculoskeletal: No aggressive appearing focal osseous lesions. Severe degenerative disc disease in the midthoracic spine, unchanged. CT ABDOMEN PELVIS FINDINGS Hepatobiliary: Normal liver with no liver mass. Cholecystectomy. No biliary ductal dilatation. Pancreas: Normal, with no mass or duct dilation. Spleen: Normal size. No mass. Adrenals/Urinary Tract: Normal adrenals. Normal kidneys with no hydronephrosis and no renal mass. Normal bladder. Stomach/Bowel: Normal non-distended stomach. Normal caliber small bowel with no small bowel wall thickening. Appendix. Oral contrast transits to the left colon. Moderate sigmoid diverticulosis, with no large bowel wall thickening or acute pericolonic fat stranding. Vascular/Lymphatic: Atherosclerotic nonaneurysmal abdominal aorta. Patent portal, splenic, hepatic and renal veins. Lobulated cystic structure in right inguinal region measures 2.8 x 1.9 cm (series 2/image 128), previously 3.3 x 1.9 cm, stable to sightly decreased, favor a chronic post procedure seroma. Mild left para-aortic adenopathy measures up to 1.5 cm (series 2/image 78), previously 1.6 cm, not appreciably changed. No additional pathologically enlarged lymph nodes in the abdomen or pelvis. Reproductive: Grossly normal uterus.  No adnexal mass. Other: No pneumoperitoneum, ascites or focal fluid collection. Musculoskeletal: No aggressive appearing focal osseous lesions. Marked lumbar spondylosis. IMPRESSION: 1. Stable mild retroperitoneal adenopathy, compatible with treated lymphoma. No new or  progressive adenopathy. No new sites of disease. Normal size spleen. 2. Three-vessel coronary atherosclerosis. 3.  Aortic Atherosclerosis (ICD10-I70.0). Electronically Signed   By: Ilona Sorrel M.D.   On: 02/19/2018 08:40    PET/CT 12/22/2015  IMPRESSION: 1. Hypermetabolic confluent lower retroperitoneal / bilateral common iliac, right external iliac and right inguinal lymphadenopathy, consistent with lymphoma. 2. Solitary focus of osseous hypermetabolism in the left upper sacrum consistent with osseous involvement by lymphoma. 3. No metabolically active lymphoma in the neck or chest. Spleen is normal in size and metabolism. 4. Additional findings include aortic atherosclerosis, three-vessel coronary atherosclerosis and mild sigmoid diverticulosis.   Electronically Signed   By: Ilona Sorrel M.D.   On: 12/22/2015 17:21   ASSESSMENT & PLAN:   77 y.o. Caucasian female with multiple chronic medical comorbidities as noted above with   #1 Stage IVB Low grade Follicular Lymphoma with extensive retroperitoneal lymphadenopathy and right inguinal lymphadenopathy as well as osseous  involvement of the sacrum. LDH level within normal limits.  S/p 5 cycles of BR (Bendamustine 70mg /m2)  PET CT scan after 5 cycles of Bendamustine and Rituxan done on 09/06/2016 shows no residual metabolic active disease and no evidence of disease progression.  CT C/A/P on 05/20/17 reveals no radiographic evidence of disease progression.   #2 Rt inguinal LN biopsy site infection and seroma -now resolved  PLAN:  -Discussed pt labwork today, 03/03/18; blood counts are normal, LDH is pending  -Discussed the 02/19/18 CT C/A/P which revealed Stable mild retroperitoneal adenopathy, compatible with treated lymphoma. No new or progressive adenopathy. No new sites of disease. Normal size spleen. 2. Three-vessel coronary atherosclerosis. 3. Aortic Atherosclerosis. -Will repeat CT imaging in 12 months  -Again counseled the  pt towards smoking cessation and the pt notes that she continues to be very sincere in her desire to continue smoking  -previously discussed pro's vs con's of maintenance Rituxan - patient had and continues to decline this option. -infection prevention strategies -Recommend warm compresses and continuing follow up with Dr Terrence Dupont -Will see the pt back in 6 months    RTC with Dr Irene Limbo in 6 months with labs    All of the patients , her friends questions were answered to their apparent satisfaction. The patient knows to call the clinic with any problems, questions or concerns.  The total time spent in the appt was 20 minutes and more than 50% was on counseling and direct patient cares.    Sullivan Lone MD [place] AAHIVMS [facility] Hematology/Oncology Physician [facility]  (Office):       [phone] (Work cell):  [phone] (Fax):           [phone]  I, Baldwin [place], am acting as a scribe for Dr. Sullivan Lone.   .I have reviewed the above documentation for accuracy and completeness, and I agree with the above. Brunetta Genera MD

## 2018-03-03 ENCOUNTER — Inpatient Hospital Stay (HOSPITAL_BASED_OUTPATIENT_CLINIC_OR_DEPARTMENT_OTHER): Payer: PPO | Admitting: Hematology

## 2018-03-03 ENCOUNTER — Inpatient Hospital Stay: Payer: PPO

## 2018-03-03 ENCOUNTER — Telehealth: Payer: Self-pay | Admitting: Hematology

## 2018-03-03 VITALS — BP 147/59 | HR 65 | Temp 98.4°F | Resp 16 | Ht 68.5 in | Wt 211.6 lb

## 2018-03-03 DIAGNOSIS — C8298 Follicular lymphoma, unspecified, lymph nodes of multiple sites: Secondary | ICD-10-CM

## 2018-03-03 DIAGNOSIS — Z79899 Other long term (current) drug therapy: Secondary | ICD-10-CM

## 2018-03-03 DIAGNOSIS — I10 Essential (primary) hypertension: Secondary | ICD-10-CM | POA: Diagnosis not present

## 2018-03-03 DIAGNOSIS — F17210 Nicotine dependence, cigarettes, uncomplicated: Secondary | ICD-10-CM

## 2018-03-03 LAB — CBC WITH DIFFERENTIAL/PLATELET
Abs Immature Granulocytes: 0.03 10*3/uL (ref 0.00–0.07)
BASOS ABS: 0 10*3/uL (ref 0.0–0.1)
Basophils Relative: 0 %
EOS PCT: 2 %
Eosinophils Absolute: 0.2 10*3/uL (ref 0.0–0.5)
HCT: 41 % (ref 36.0–46.0)
Hemoglobin: 12.9 g/dL (ref 12.0–15.0)
Immature Granulocytes: 0 %
Lymphocytes Relative: 19 %
Lymphs Abs: 1.9 10*3/uL (ref 0.7–4.0)
MCH: 30.9 pg (ref 26.0–34.0)
MCHC: 31.5 g/dL (ref 30.0–36.0)
MCV: 98.3 fL (ref 80.0–100.0)
Monocytes Absolute: 0.8 10*3/uL (ref 0.1–1.0)
Monocytes Relative: 8 %
NRBC: 0 % (ref 0.0–0.2)
Neutro Abs: 6.9 10*3/uL (ref 1.7–7.7)
Neutrophils Relative %: 71 %
Platelets: 162 10*3/uL (ref 150–400)
RBC: 4.17 MIL/uL (ref 3.87–5.11)
RDW: 12.7 % (ref 11.5–15.5)
WBC: 9.9 10*3/uL (ref 4.0–10.5)

## 2018-03-03 LAB — CMP (CANCER CENTER ONLY)
ALT: 13 U/L (ref 0–44)
AST: 14 U/L — ABNORMAL LOW (ref 15–41)
Albumin: 3.4 g/dL — ABNORMAL LOW (ref 3.5–5.0)
Alkaline Phosphatase: 60 U/L (ref 38–126)
Anion gap: 9 (ref 5–15)
BUN: 13 mg/dL (ref 8–23)
CO2: 29 mmol/L (ref 22–32)
Calcium: 8.6 mg/dL — ABNORMAL LOW (ref 8.9–10.3)
Chloride: 106 mmol/L (ref 98–111)
Creatinine: 0.85 mg/dL (ref 0.44–1.00)
GFR, Est AFR Am: >60 mL/min (ref >60)
GFR, Estimated: >60 mL/min (ref >60)
Glucose, Bld: 143 mg/dL — ABNORMAL HIGH (ref 70–99)
Potassium: 3.7 mmol/L (ref 3.5–5.1)
Sodium: 144 mmol/L (ref 135–145)
Total Bilirubin: 0.4 mg/dL (ref 0.3–1.2)
Total Protein: 6.4 g/dL — ABNORMAL LOW (ref 6.5–8.1)

## 2018-03-03 LAB — LACTATE DEHYDROGENASE: LDH: 182 U/L (ref 98–192)

## 2018-03-03 NOTE — Telephone Encounter (Signed)
Gave patient avs report and appointments for June.  °

## 2018-03-10 DIAGNOSIS — M7072 Other bursitis of hip, left hip: Secondary | ICD-10-CM | POA: Diagnosis not present

## 2018-03-24 DIAGNOSIS — H40153 Residual stage of open-angle glaucoma, bilateral: Secondary | ICD-10-CM | POA: Diagnosis not present

## 2018-03-26 ENCOUNTER — Ambulatory Visit (INDEPENDENT_AMBULATORY_CARE_PROVIDER_SITE_OTHER): Payer: PPO | Admitting: Family

## 2018-03-26 ENCOUNTER — Encounter: Payer: Self-pay | Admitting: Family

## 2018-03-26 VITALS — BP 132/56 | HR 65 | Temp 97.4°F | Ht 69.0 in | Wt 214.0 lb

## 2018-03-26 DIAGNOSIS — F411 Generalized anxiety disorder: Secondary | ICD-10-CM

## 2018-03-26 DIAGNOSIS — Z1239 Encounter for other screening for malignant neoplasm of breast: Secondary | ICD-10-CM

## 2018-03-26 DIAGNOSIS — M545 Low back pain: Secondary | ICD-10-CM

## 2018-03-26 DIAGNOSIS — Z0000 Encounter for general adult medical examination without abnormal findings: Secondary | ICD-10-CM | POA: Insufficient documentation

## 2018-03-26 DIAGNOSIS — I10 Essential (primary) hypertension: Secondary | ICD-10-CM | POA: Diagnosis not present

## 2018-03-26 DIAGNOSIS — G8929 Other chronic pain: Secondary | ICD-10-CM

## 2018-03-26 DIAGNOSIS — F325 Major depressive disorder, single episode, in full remission: Secondary | ICD-10-CM | POA: Insufficient documentation

## 2018-03-26 DIAGNOSIS — F419 Anxiety disorder, unspecified: Secondary | ICD-10-CM | POA: Insufficient documentation

## 2018-03-26 DIAGNOSIS — C8298 Follicular lymphoma, unspecified, lymph nodes of multiple sites: Secondary | ICD-10-CM | POA: Diagnosis not present

## 2018-03-26 MED ORDER — SERTRALINE HCL 50 MG PO TABS: 50.0000 mg | ORAL_TABLET | Freq: Every day | ORAL | 3 refills | Status: DC

## 2018-03-26 NOTE — Assessment & Plan Note (Signed)
Ordered; patient will schedule.  

## 2018-03-26 NOTE — Assessment & Plan Note (Signed)
Doing well on gabapentin. She is seeing Dr Trenton Gammon this month Rare use of tramadol which I advised to continue.  New CSC.

## 2018-03-26 NOTE — Assessment & Plan Note (Signed)
Discussed risks of longterm use of bzds; advised discretion, safe storage.  New CSC in place.  Start zoloft for better control.

## 2018-03-26 NOTE — Patient Instructions (Signed)
Start zoloft at bedtime  We placed a referral for mammogram this year. I asked that you call one the below locations and schedule this when it is convenient for you.   As discussed, I would like you to ask for 3D mammogram over the traditional 2D mammogram as new evidence suggest 3D is superior.   Please note that NOT all insurance companies cover 3D and you may have to pay a higher copay. You may call your insurance company to further clarify your benefits.   Options for [place]  [place], [place]  * [facility] [place], [place] * Note if you ask for 3D mammogram at this location, you must request [place], [place] location*

## 2018-03-26 NOTE — Assessment & Plan Note (Signed)
Continue to follow with Dr Irene Limbo for surveillance; will follow

## 2018-03-26 NOTE — Progress Notes (Signed)
Subjective:    Patient ID: Samantha [name], female    DOB: 1940/06/22, 78 y.o.   MRN: [id number]  CC: [name] is a 78 y.o. female who presents today to establish care.    HPI: Feels well today. No complaints.   GAD- May take half of xanax qhs prn. Takes 2 or 3 per week. No h/o drug addiction.   Insomnia - on melatonin to help with sleep. Trouble falling asleep, 'thinking too much before sleep.'   HTN- compliant with medication. No cp, sob, palpitations.   Unsure of heart rhythm or if has afib.  HLD- on lipitor.   Follows with cardiology, Dr Loa Socks who has been also in PCP role.   H/o MI- on eliquis. Had been on warfarin. No excessive bleeding.   Chronic back pain and left leg pain- takes mobic qd. using gabapentin 300mg  up to TID prn.  Tramadol is 'very seldom.' Takes it as needed , always takes it at bedtime.  No h/o GIB.  Has seen pain clinic and Dr Sharlet Salina in the past.   Has appointment  04/02/2018, with orthopedic, Dr Trenton Gammon,  due to low back and left hip pain, using cane for this.     Dr Irene Limbo- lymphoma; s/p chemo for 5 months.  Dxed 2017  HISTORY:  Past Medical History:  Diagnosis Date  . A-fib ([place])   . Arthritis   . Chronic back pain   . COPD (chronic obstructive pulmonary disease) ([place])   . Coronary artery disease   . Glaucoma   . Hearing aid worn   . High cholesterol   . Hypertension   . Lymphadenopathy 01/25/2016  . Myocardial infarction ([place])    2000  . Non Hodgkin's lymphoma ([place])   . Wears dentures    " Top plate"  . Wears glasses    Past Surgical History:  Procedure Laterality Date  . CARDIAC CATHETERIZATION    . CATARACT EXTRACTION W/ INTRAOCULAR LENS IMPLANT    . CHOLECYSTECTOMY    . COLONOSCOPY W/ BIOPSIES AND POLYPECTOMY    . CORONARY STENT PLACEMENT    . INGUINAL LYMPH NODE BIOPSY Right 01/25/2016   Procedure: EXCISION DEEP RIGHT INGUINAL LYMPH NODE BIOPSY WITH ULTRA SOUND;  Surgeon: Fanny Skates, MD;  Location: [facility];  Service:  General;  Laterality: Right;   Family History  Problem Relation Age of Onset  . Hypertension Mother   . Stroke Mother   . Heart disease Father   . Heart disease Other     Allergies: Hydrocodone and Oxycodone Current Outpatient Medications on File Prior to Visit  Medication Sig Dispense Refill  . ALPRAZolam (XANAX) 0.25 MG tablet Take 0.25 mg by mouth daily as needed for anxiety.     Marland Kitchen amiodarone (PACERONE) 200 MG tablet Take 100 mg by mouth every morning.    Marland Kitchen atorvastatin (LIPITOR) 40 MG tablet Take 40 mg by mouth every morning.     Marland Kitchen ELIQUIS 5 MG TABS tablet Take 5 mg by mouth 2 (two) times daily.    Marland Kitchen gabapentin (NEURONTIN) 300 MG capsule Take 300 mg by mouth 2 (two) times daily.    . metoprolol succinate (TOPROL-XL) 50 MG 24 hr tablet Take 50 mg by mouth daily.    . traMADol (ULTRAM) 50 MG tablet Take 1-2 tablets (50-100 mg total) by mouth every 6 (six) hours as needed. 30 tablet 1  . Bimatoprost (LUMIGAN OP) Place 1 drop into both eyes at bedtime.    . nitroGLYCERIN (  NITROSTAT) 0.4 MG SL tablet Place 0.4 mg under the tongue every 5 (five) minutes x 3 doses as needed for chest pain.     Marland Kitchen timolol (TIMOPTIC) 0.5 % ophthalmic solution Place 1 drop into both eyes. Instill 1 drop in each eye in the morning and one drop in each eye at night     No current facility-administered medications on file prior to visit.     Social History   Tobacco Use  . Smoking status: Current Every Day Smoker    Packs/day: 0.50    Types: Cigarettes  . Smokeless tobacco: Never Used  Substance Use Topics  . Alcohol use: Yes    Comment: social  . Drug use: No    Review of Systems  Constitutional: Negative for chills and fever.  Respiratory: Negative for cough.   Cardiovascular: Negative for chest pain and palpitations.  Gastrointestinal: Negative for nausea and vomiting.  Psychiatric/Behavioral: Positive for sleep disturbance. Negative for suicidal ideas. The patient is nervous/anxious.         Objective:    BP (!) 132/56 (BP Location: Left Arm, Patient Position: Sitting, Cuff Size: Large)   Pulse 65   Temp (!) 97.4 F (36.3 C)   Ht 5\' 9"  (1.753 m)   Wt 214 lb (97.1 kg)   SpO2 98%   BMI 31.60 kg/m  BP Readings from Last 3 Encounters:  03/26/18 (!) 132/56  03/03/18 (!) 147/59  09/02/17 (!) 130/43   Wt Readings from Last 3 Encounters:  03/26/18 214 lb (97.1 kg)  03/03/18 211 lb 9.6 oz (96 kg)  09/02/17 207 lb 4.8 oz (94 kg)    Physical Exam Vitals signs reviewed.  Constitutional:      Appearance: She is well-developed.  Eyes:     Conjunctiva/sclera: Conjunctivae normal.  Cardiovascular:     Rate and Rhythm: Normal rate and regular rhythm.     Pulses: Normal pulses.     Heart sounds: Normal heart sounds.  Pulmonary:     Effort: Pulmonary effort is normal.     Breath sounds: Normal breath sounds. No wheezing, rhonchi or rales.  Skin:    General: Skin is warm and dry.  Neurological:     Mental Status: She is alert.  Psychiatric:        Speech: Speech normal.        Behavior: Behavior normal.        Thought Content: Thought content normal.        Assessment & Plan:   Problem List Items Addressed This Visit      Cardiovascular and Mediastinum   HTN (hypertension)    Requesting medical records, in particular unsure as to why patient is on amiodarone. BP At goal, continue regimen        Immune and Lymphatic   Follicular lymphoma of lymph nodes of multiple regions [facility])    Continue to follow with Dr Irene Limbo for surveillance; will follow        Other   GAD (generalized anxiety disorder) - Primary    Discussed risks of longterm use of bzds; advised discretion, safe storage.  New CSC in place.  Start zoloft for better control.       Relevant Medications   sertraline (ZOLOFT) 50 MG tablet   Screening for breast cancer    Ordered; patient will schedule      Relevant Orders   MM 3D SCREEN BREAST BILATERAL   Chronic low back pain    Doing well on  gabapentin. She is seeing Dr Trenton Gammon this month Rare use of tramadol which I advised to continue.  New CSC.           I have discontinued Jalah Warmuth. Rispoli's Melatonin, losartan, dexamethasone, ondansetron, prochlorperazine, potassium chloride SA, and doxycycline. I am also having her start on sertraline. Additionally, I am having her maintain her nitroGLYCERIN, ALPRAZolam, gabapentin, amiodarone, metoprolol succinate, atorvastatin, ELIQUIS, Bimatoprost (LUMIGAN OP), traMADol, and timolol.   Meds ordered this encounter  Medications  . sertraline (ZOLOFT) 50 MG tablet    Sig: Take 1 tablet (50 mg total) by mouth at bedtime.    Dispense:  90 tablet    Refill:  3    Order Specific Question:   Supervising Provider    Answer:   Crecencio Mc [[id number]]    Return precautions given.   Risks, benefits, and alternatives of the medications and treatment plan prescribed today were discussed, and patient expressed understanding.   Education regarding symptom management and diagnosis given to patient on AVS.  Continue to follow with Burnard Hawthorne, FNP for routine health maintenance.   Velora Mediate and I agreed with plan.   Mable Paris, FNP

## 2018-03-26 NOTE — Assessment & Plan Note (Addendum)
Requesting medical records, in particular unsure as to why patient is on amiodarone. BP At goal, continue regimen

## 2018-04-02 DIAGNOSIS — Z6830 Body mass index (BMI) 30.0-30.9, adult: Secondary | ICD-10-CM | POA: Diagnosis not present

## 2018-04-02 DIAGNOSIS — M5416 Radiculopathy, lumbar region: Secondary | ICD-10-CM | POA: Diagnosis not present

## 2018-04-02 DIAGNOSIS — I10 Essential (primary) hypertension: Secondary | ICD-10-CM | POA: Diagnosis not present

## 2018-04-09 DIAGNOSIS — M5416 Radiculopathy, lumbar region: Secondary | ICD-10-CM | POA: Diagnosis not present

## 2018-04-09 DIAGNOSIS — M545 Low back pain: Secondary | ICD-10-CM | POA: Diagnosis not present

## 2018-04-10 ENCOUNTER — Other Ambulatory Visit: Payer: Self-pay | Admitting: Neurosurgery

## 2018-04-10 DIAGNOSIS — M5416 Radiculopathy, lumbar region: Secondary | ICD-10-CM | POA: Diagnosis not present

## 2018-04-10 DIAGNOSIS — M48061 Spinal stenosis, lumbar region without neurogenic claudication: Secondary | ICD-10-CM | POA: Diagnosis not present

## 2018-04-10 DIAGNOSIS — Z6830 Body mass index (BMI) 30.0-30.9, adult: Secondary | ICD-10-CM | POA: Diagnosis not present

## 2018-04-18 ENCOUNTER — Encounter (HOSPITAL_COMMUNITY)
Admission: RE | Admit: 2018-04-18 | Discharge: 2018-04-18 | Disposition: A | Discharge status: Home / Self Care | Payer: PPO | Source: Ambulatory Visit | Attending: Neurosurgery | Admitting: Neurosurgery

## 2018-04-18 ENCOUNTER — Encounter (HOSPITAL_COMMUNITY): Payer: Self-pay

## 2018-04-18 ENCOUNTER — Other Ambulatory Visit: Payer: Self-pay

## 2018-04-18 DIAGNOSIS — Z01818 Encounter for other preprocedural examination: Secondary | ICD-10-CM | POA: Diagnosis not present

## 2018-04-18 LAB — BASIC METABOLIC PANEL
Anion gap: 7 (ref 5–15)
BUN: 12 mg/dL (ref 8–23)
CO2: 28 mmol/L (ref 22–32)
Calcium: 8.9 mg/dL (ref 8.9–10.3)
Chloride: 107 mmol/L (ref 98–111)
Creatinine, Ser: 0.91 mg/dL (ref 0.44–1.00)
GFR calc Af Amer: >60 mL/min (ref >60)
GFR calc non Af Amer: >60 mL/min (ref >60)
Glucose, Bld: 137 mg/dL — ABNORMAL HIGH (ref 70–99)
Potassium: 4.9 mmol/L (ref 3.5–5.1)
SODIUM: 142 mmol/L (ref 135–145)

## 2018-04-18 LAB — CBC WITH DIFFERENTIAL/PLATELET
Abs Immature Granulocytes: 0.03 10*3/uL (ref 0.00–0.07)
Basophils Absolute: 0.1 10*3/uL (ref 0.0–0.1)
Basophils Relative: 1 %
Eosinophils Absolute: 0.2 10*3/uL (ref 0.0–0.5)
Eosinophils Relative: 2 %
HCT: 42 % (ref 36.0–46.0)
Hemoglobin: 13 g/dL (ref 12.0–15.0)
Immature Granulocytes: 0 %
Lymphocytes Relative: 18 %
Lymphs Abs: 1.7 10*3/uL (ref 0.7–4.0)
MCH: 30.6 pg (ref 26.0–34.0)
MCHC: 31 g/dL (ref 30.0–36.0)
MCV: 98.8 fL (ref 80.0–100.0)
Monocytes Absolute: 0.9 10*3/uL (ref 0.1–1.0)
Monocytes Relative: 9 %
NEUTROS PCT: 70 %
Neutro Abs: 6.5 10*3/uL (ref 1.7–7.7)
Platelets: 189 10*3/uL (ref 150–400)
RBC: 4.25 MIL/uL (ref 3.87–5.11)
RDW: 13 % (ref 11.5–15.5)
WBC: 9.4 10*3/uL (ref 4.0–10.5)
nRBC: 0 % (ref 0.0–0.2)

## 2018-04-18 LAB — SURGICAL PCR SCREEN
MRSA, PCR: NEGATIVE
STAPHYLOCOCCUS AUREUS: NEGATIVE

## 2018-04-18 LAB — ABO/RH: ABO/RH(D): A POS

## 2018-04-18 LAB — TYPE AND SCREEN
ABO/RH(D): A POS
Antibody Screen: NEGATIVE

## 2018-04-18 NOTE — Pre-Procedure Instructions (Signed)
Samantha [name]  04/18/2018      [id number] - [place], [place] - [address] [address] Phone: [phone] Fax: [phone]    Your procedure is scheduled on 04/25/2018.  Report to [facility] Admitting at [phone] A.M.  Call this number if you have problems the morning of surgery:  [phone]   Remember:  Do not eat or drink after midnight.     Take these medicines the morning of surgery with A SIP OF WATER: Alprazolam (Xanax) - if needed Amiodarone (Pacerone) Amlodipine (Norvasc) Gabapentin (Neurontin) Metoprolol succinate (Toprol XL) Nitroglycerin (Nitrostat) - if needed Timolol (timoptic) eye drop Tramadol (Ultram) - if needed  7 days prior to surgery STOP taking any Aspirin (unless otherwise instructed by your surgeon), Aleve, Naproxen, Ibuprofen, Motrin, Advil, Goody's, BC's, all herbal medications, fish oil, and all vitamins.  Follow your surgeon's instructions on when to stop Eliquis.  If no instructions were given by your surgeon then you will need to call the office to get those instructions.       Do not wear jewelry, make-up or nail polish.  Do not wear lotions, powders, or perfumes, or deodorant.  Do not shave 48 hours prior to surgery.    Do not bring valuables to the hospital.  [facility] is not responsible for any belongings or valuables.  Contacts, eyeglasses, hearing aids, dentures or bridgework may not be worn into surgery.  Leave your suitcase in the car.  After surgery it may be brought to your room.  For patients admitted to the hospital, discharge time will be determined by your treatment team.  Patients discharged the day of surgery will not be allowed to drive home.   Name and phone number of your driver:    Special instructions:   [facility]- Preparing For Surgery  Before surgery, you can play an important role. Because skin is not sterile, your skin needs to be as free of germs as possible. You  can reduce the number of germs on your skin by washing with CHG (chlorahexidine gluconate) Soap before surgery.  CHG is an antiseptic cleaner which kills germs and bonds with the skin to continue killing germs even after washing.    Oral Hygiene is also important to reduce your risk of infection.  Remember - BRUSH YOUR TEETH THE MORNING OF SURGERY WITH YOUR REGULAR TOOTHPASTE  Please do not use if you have an allergy to CHG or antibacterial soaps. If your skin becomes reddened/irritated stop using the CHG.  Do not shave (including legs and underarms) for at least 48 hours prior to first CHG shower. It is OK to shave your face.  Please follow these instructions carefully.   1. Shower the NIGHT BEFORE SURGERY and the MORNING OF SURGERY with CHG.   2. If you chose to wash your hair, wash your hair first as usual with your normal shampoo.  3. After you shampoo, rinse your hair and body thoroughly to remove the shampoo.  4. Use CHG as you would any other liquid soap. You can apply CHG directly to the skin and wash gently with a scrungie or a clean washcloth.   5. Apply the CHG Soap to your body ONLY FROM THE NECK DOWN.  Do not use on open wounds or open sores. Avoid contact with your eyes, ears, mouth and genitals (private parts). Wash Face and genitals (private parts)  with your normal soap.  6. Wash thoroughly,  paying special attention to the area where your surgery will be performed.  7. Thoroughly rinse your body with warm water from the neck down.  8. DO NOT shower/wash with your normal soap after using and rinsing off the CHG Soap.  9. Pat yourself dry with a CLEAN TOWEL.  10. Wear CLEAN PAJAMAS to bed the night before surgery, wear comfortable clothes the morning of surgery  11. Place CLEAN SHEETS on your bed the night of your first shower and DO NOT SLEEP WITH PETS.    Day of Surgery: Shower as stated above. Do not apply any deodorants/lotions.  Please wear clean clothes to the  hospital/surgery center.   Remember to brush your teeth WITH YOUR REGULAR TOOTHPASTE.    Please read over the following fact sheets that you were given.

## 2018-04-18 NOTE — Progress Notes (Signed)
PCP - Mable Paris Cardiologist - Harwani  Chest x-ray - not needed EKG - 04/18/18 Stress Test - 2015 ECHO - denies Cardiac Cath - denies  Blood Thinner Instructions: last dose 04/17/18 will need PT/INR DOS  Anesthesia review: yes, hx afib   Patient denies shortness of breath, fever, cough and chest pain at PAT appointment   Patient verbalized understanding of instructions that were given to them at the PAT appointment. Patient was also instructed that they will need to review over the PAT instructions again at home before surgery.

## 2018-04-21 ENCOUNTER — Encounter: Payer: Self-pay | Admitting: Family

## 2018-04-21 NOTE — Anesthesia Preprocedure Evaluation (Addendum)
Anesthesia Evaluation  Patient identified by MRN, date of birth, ID band Patient awake    Reviewed: Allergy & Precautions, NPO status , Patient's Chart, lab work & pertinent test results, reviewed documented beta blocker date and time   History of Anesthesia Complications Negative for: history of anesthetic complications  Airway Mallampati: II  TM Distance: >3 FB Neck ROM: Full    Dental  (+) Upper Dentures   Pulmonary neg shortness of breath, neg sleep apnea, COPD, neg recent URI, Current Smoker,    breath sounds clear to auscultation       Cardiovascular hypertension, Pt. on medications and Pt. on home beta blockers + CAD and + Past MI  + dysrhythmias Atrial Fibrillation  Rhythm:Regular     Neuro/Psych PSYCHIATRIC DISORDERS Anxiety negative neurological ROS     GI/Hepatic negative GI ROS, Neg liver ROS,   Endo/Other  negative endocrine ROS  Renal/GU negative Renal ROS     Musculoskeletal  (+) Arthritis ,   Abdominal   Peds  Hematology negative hematology ROS (+)   Anesthesia Other Findings  78 yo female current smoker. Pertinent hx includes hypertension, MI/CADs/p RCA stent '01, atrial fibrillation/PAF, follicular lymphoma s/p chemo, COPD, 1/6 systolic murmur per cardiology notes. . Pt follows with Dr. Terrence Dupont for hx of CAD and pAfib. Per last OV note 02/05/18 "Doing well.  Denies any chest pain or shortness of breath.  Denies palpitations, lightheadedness, or syncope.   Cleared by Dr. Terrence Dupont as low risk.   Reproductive/Obstetrics                            Anesthesia Physical Anesthesia Plan  ASA: III  Anesthesia Plan: General   Post-op Pain Management:    Induction: Intravenous  PONV Risk Score and Plan: 2  Airway Management Planned: Oral ETT  Additional Equipment: None  Intra-op Plan:   Post-operative Plan: Extubation in OR  Informed Consent: I have reviewed the  patients History and Physical, chart, labs and discussed the procedure including the risks, benefits and alternatives for the proposed anesthesia with the patient or authorized representative who has indicated his/her understanding and acceptance.     Dental advisory given  Plan Discussed with: CRNA and Surgeon  Anesthesia Plan Comments: (See PAT note by Karoline Caldwell, PA-C )       Anesthesia Quick Evaluation

## 2018-04-21 NOTE — Progress Notes (Signed)
Anesthesia Chart Review:  Case:  [id number] Date/Time:  04/25/18 0925   Procedure:  L4-5 POSTERIOR LUMBAR FUSION 1 LEVEL (N/A Back)   Anesthesia type:  General   Pre-op diagnosis:  Stenosis   Location:  [facility] OR ROOM 20 / [facility]   Surgeon:  Earnie Larsson, MD     DISCUSSION: 78 yo female current smoker. Pertinent hx includes hypertension, MI/CAD s/p RCA stent '01, atrial fibrillation/PAF, follicular lymphoma s/p chemo, COPD, 1/6 systolic murmur per cardiology notes. . Pt follows with Dr. Terrence Dupont for hx of CAD and pAfib. Per last OV note 02/05/18 "Doing well.  Denies any chest pain or shortness of breath.  Denies palpitations, lightheadedness, or syncope.   Cleared by Dr. Terrence Dupont as low risk.   Anticipate she can proceed as planned barring acute status change.   VS: BP (!) 154/54   Pulse 66   Temp 36.4 C (Oral)   Resp 18   Ht 5\' 9"  (1.753 m)   Wt 95.3 kg   SpO2 97%   BMI 31.03 kg/m   PROVIDERS: Burnard Hawthorne, FNP is PCP  Charolette Forward is Cardiologist  LABS: Labs reviewed: Acceptable for surgery. (all labs ordered are listed, but only abnormal results are displayed)  Labs Reviewed  BASIC METABOLIC PANEL - Abnormal; Notable for the following components:      Result Value   Glucose, Bld 137 (*)    All other components within normal limits  SURGICAL PCR SCREEN  CBC WITH DIFFERENTIAL/PLATELET  TYPE AND SCREEN  ABO/RH     IMAGES: CT chest abd/pelvis 02/18/2018:  IMPRESSION: 1. Stable mild retroperitoneal adenopathy, compatible with treated lymphoma. No new or progressive adenopathy. No new sites of disease. Normal size spleen. 2. Three-vessel coronary atherosclerosis. 3.  Aortic Atherosclerosis (ICD10-I70.0).  EKG: 04/18/2018: Normal sinus rhythm. Rate 61. Low voltage QRS. Inferior infarct , age undetermined. No significant change since last tracing in 2017.  CV: 05/12/15 Echo (Dr. Terrence Dupont):  Summary: Left ventricular systolic function is normal with normal ejection  fraction of 55-60%. The left ventricle shows normal contractility in size. Mild aortic regurgitation. Trace tricuspid regurgitation. No intracardiac shunting. No intracardiac masses or thrombi seen. No pericardial effusion seen.  08/05/13 Nuclear stress test:  IMPRESSION: 1. Small relatively matched area of attenuation involving the inferior wall of the left ventricle without associated regional wall motion abnormality. No definite scintigraphic evidence of prior infarction or pharmacologically induced ischemia. 2. Normal wall motion. Ejection fraction - 54%. (Previous ejection fraction - 68%).  07/27/08 Cardiac cath:  FINDINGS:  LV showed mild LVH, good LV systolic function, EF of 28-41%. Left main was patent. LAD has 10-15% ostial stenosis, which is more apparent in LAO cranial view and 15-20% mid stenosis, distally the vessel is diffusely diseased. Diagonal 1 and 2 were very small,diagonal III was small which was patent. Left circumflex is patent. OM-1 and OM-2 were very very small, OM-3 was very small, which was patent, OM-4 was small which was patent. RCA has 15-20% mid in-stent restenosis. PDA is very very small. PLV branches are small, which are patent.     Past Medical History:  Diagnosis Date  . A-fib ([place])   . Arthritis   . Chronic back pain   . COPD (chronic obstructive pulmonary disease) ([place])   . Coronary artery disease   . Glaucoma   . Hearing aid worn   . High cholesterol   . Hypertension   . Lymphadenopathy 01/25/2016  . Myocardial infarction ([place])  2000  . Non Hodgkin's lymphoma ([place])   . Wears dentures    " Top plate"  . Wears glasses     Past Surgical History:  Procedure Laterality Date  . CARDIAC CATHETERIZATION    . CATARACT EXTRACTION W/ INTRAOCULAR LENS IMPLANT    . CHOLECYSTECTOMY    . COLONOSCOPY W/ BIOPSIES AND POLYPECTOMY    . CORONARY STENT PLACEMENT    . INGUINAL LYMPH NODE BIOPSY Right 01/25/2016   Procedure: EXCISION DEEP RIGHT  INGUINAL LYMPH NODE BIOPSY WITH ULTRA SOUND;  Surgeon: Fanny Skates, MD;  Location: [facility];  Service: General;  Laterality: Right;    MEDICATIONS: . ALPRAZolam (XANAX) 0.25 MG tablet  . amiodarone (PACERONE) 200 MG tablet  . amLODipine (NORVASC) 5 MG tablet  . atorvastatin (LIPITOR) 40 MG tablet  . ELIQUIS 5 MG TABS tablet  . gabapentin (NEURONTIN) 300 MG capsule  . meloxicam (MOBIC) 7.5 MG tablet  . metoprolol succinate (TOPROL-XL) 50 MG 24 hr tablet  . nitroGLYCERIN (NITROSTAT) 0.4 MG SL tablet  . sertraline (ZOLOFT) 50 MG tablet  . timolol (TIMOPTIC) 0.5 % ophthalmic solution  . traMADol (ULTRAM) 50 MG tablet   No current facility-administered medications for this encounter.     Wynonia Musty [facility] Short Stay Center/Anesthesiology Phone [phone] 04/21/2018 2:50 PM

## 2018-04-25 ENCOUNTER — Encounter (HOSPITAL_COMMUNITY)
Admission: RE | Disposition: A | Discharge status: Home / Self Care | Payer: Self-pay | Source: Home / Self Care | Attending: Neurosurgery

## 2018-04-25 ENCOUNTER — Inpatient Hospital Stay (HOSPITAL_COMMUNITY): Payer: PPO | Admitting: Physician Assistant

## 2018-04-25 ENCOUNTER — Inpatient Hospital Stay (HOSPITAL_COMMUNITY): Payer: PPO

## 2018-04-25 ENCOUNTER — Encounter (HOSPITAL_COMMUNITY): Payer: Self-pay | Admitting: *Deleted

## 2018-04-25 ENCOUNTER — Inpatient Hospital Stay (HOSPITAL_COMMUNITY): Payer: PPO | Admitting: Certified Registered Nurse Anesthetist

## 2018-04-25 ENCOUNTER — Inpatient Hospital Stay (HOSPITAL_COMMUNITY)
Admission: RE | Admit: 2018-04-25 | Discharge: 2018-04-26 | DRG: 455 | Disposition: A | Discharge status: Home / Self Care | Payer: PPO | Attending: Neurosurgery | Admitting: Neurosurgery

## 2018-04-25 DIAGNOSIS — Z8572 Personal history of non-Hodgkin lymphomas: Secondary | ICD-10-CM

## 2018-04-25 DIAGNOSIS — Z7901 Long term (current) use of anticoagulants: Secondary | ICD-10-CM | POA: Diagnosis not present

## 2018-04-25 DIAGNOSIS — Z79899 Other long term (current) drug therapy: Secondary | ICD-10-CM

## 2018-04-25 DIAGNOSIS — M48061 Spinal stenosis, lumbar region without neurogenic claudication: Principal | ICD-10-CM | POA: Diagnosis present

## 2018-04-25 DIAGNOSIS — I2510 Atherosclerotic heart disease of native coronary artery without angina pectoris: Secondary | ICD-10-CM | POA: Diagnosis not present

## 2018-04-25 DIAGNOSIS — M1990 Unspecified osteoarthritis, unspecified site: Secondary | ICD-10-CM | POA: Diagnosis not present

## 2018-04-25 DIAGNOSIS — I252 Old myocardial infarction: Secondary | ICD-10-CM

## 2018-04-25 DIAGNOSIS — G8929 Other chronic pain: Secondary | ICD-10-CM | POA: Diagnosis not present

## 2018-04-25 DIAGNOSIS — E7800 Pure hypercholesterolemia, unspecified: Secondary | ICD-10-CM | POA: Diagnosis present

## 2018-04-25 DIAGNOSIS — Z419 Encounter for procedure for purposes other than remedying health state, unspecified: Secondary | ICD-10-CM

## 2018-04-25 DIAGNOSIS — Z8249 Family history of ischemic heart disease and other diseases of the circulatory system: Secondary | ICD-10-CM

## 2018-04-25 DIAGNOSIS — Z955 Presence of coronary angioplasty implant and graft: Secondary | ICD-10-CM

## 2018-04-25 DIAGNOSIS — J449 Chronic obstructive pulmonary disease, unspecified: Secondary | ICD-10-CM | POA: Diagnosis not present

## 2018-04-25 DIAGNOSIS — Z974 Presence of external hearing-aid: Secondary | ICD-10-CM | POA: Diagnosis not present

## 2018-04-25 DIAGNOSIS — I4891 Unspecified atrial fibrillation: Secondary | ICD-10-CM | POA: Diagnosis not present

## 2018-04-25 DIAGNOSIS — Z823 Family history of stroke: Secondary | ICD-10-CM | POA: Diagnosis not present

## 2018-04-25 DIAGNOSIS — Z791 Long term (current) use of non-steroidal anti-inflammatories (NSAID): Secondary | ICD-10-CM

## 2018-04-25 DIAGNOSIS — F17210 Nicotine dependence, cigarettes, uncomplicated: Secondary | ICD-10-CM | POA: Diagnosis present

## 2018-04-25 DIAGNOSIS — I10 Essential (primary) hypertension: Secondary | ICD-10-CM | POA: Diagnosis not present

## 2018-04-25 DIAGNOSIS — M5416 Radiculopathy, lumbar region: Secondary | ICD-10-CM | POA: Diagnosis present

## 2018-04-25 DIAGNOSIS — Z972 Presence of dental prosthetic device (complete) (partial): Secondary | ICD-10-CM | POA: Diagnosis not present

## 2018-04-25 DIAGNOSIS — F419 Anxiety disorder, unspecified: Secondary | ICD-10-CM | POA: Diagnosis present

## 2018-04-25 DIAGNOSIS — M4326 Fusion of spine, lumbar region: Secondary | ICD-10-CM | POA: Diagnosis not present

## 2018-04-25 DIAGNOSIS — H409 Unspecified glaucoma: Secondary | ICD-10-CM | POA: Diagnosis present

## 2018-04-25 LAB — PROTIME-INR
INR: 1.04
Prothrombin Time: 13.5 seconds (ref 11.4–15.2)

## 2018-04-25 SURGERY — POSTERIOR LUMBAR FUSION 1 LEVEL
Anesthesia: General | Site: Back

## 2018-04-25 MED ORDER — ONDANSETRON HCL 4 MG PO TABS: 4.0000 mg | ORAL_TABLET | Freq: Four times a day (QID) | ORAL | Status: DC | PRN

## 2018-04-25 MED ORDER — METOPROLOL SUCCINATE ER 50 MG PO TB24: 50.0000 mg | ORAL_TABLET | Freq: Every day | ORAL | Status: DC

## 2018-04-25 MED ORDER — MIDAZOLAM HCL 2 MG/2ML IJ SOLN
INTRAMUSCULAR | Status: AC
  Filled 2018-04-25: qty 2

## 2018-04-25 MED ORDER — GLYCOPYRROLATE PF 0.2 MG/ML IJ SOSY
PREFILLED_SYRINGE | INTRAMUSCULAR | Status: AC
  Filled 2018-04-25: qty 1

## 2018-04-25 MED ORDER — MELOXICAM 7.5 MG PO TABS
7.5000 mg | ORAL_TABLET | Freq: Every day | ORAL | Status: DC
  Administered 2018-04-25: 7.5 mg via ORAL
  Filled 2018-04-25: qty 1

## 2018-04-25 MED ORDER — FENTANYL CITRATE (PF) 250 MCG/5ML IJ SOLN
INTRAMUSCULAR | Status: DC | PRN
  Administered 2018-04-25 (×4): 50 ug via INTRAVENOUS

## 2018-04-25 MED ORDER — ONDANSETRON HCL 4 MG/2ML IJ SOLN
INTRAMUSCULAR | Status: DC | PRN
  Administered 2018-04-25: 4 mg via INTRAVENOUS

## 2018-04-25 MED ORDER — THROMBIN 20000 UNITS EX SOLR
CUTANEOUS | Status: DC | PRN
  Administered 2018-04-25: 20 mL via TOPICAL

## 2018-04-25 MED ORDER — ROCURONIUM BROMIDE 10 MG/ML (PF) SYRINGE
PREFILLED_SYRINGE | INTRAVENOUS | Status: DC | PRN
  Administered 2018-04-25: 60 mg via INTRAVENOUS
  Administered 2018-04-25: 10 mg via INTRAVENOUS
  Administered 2018-04-25: 30 mg via INTRAVENOUS

## 2018-04-25 MED ORDER — ONDANSETRON HCL 4 MG/2ML IJ SOLN
INTRAMUSCULAR | Status: AC
  Filled 2018-04-25: qty 2

## 2018-04-25 MED ORDER — ACETAMINOPHEN 500 MG PO TABS: 1000.0000 mg | ORAL_TABLET | Freq: Once | ORAL | Status: DC | PRN

## 2018-04-25 MED ORDER — MIDAZOLAM HCL 2 MG/2ML IJ SOLN
INTRAMUSCULAR | Status: DC | PRN
  Administered 2018-04-25: 2 mg via INTRAVENOUS

## 2018-04-25 MED ORDER — HYDROMORPHONE HCL 1 MG/ML IJ SOLN
1.0000 mg | INTRAMUSCULAR | Status: DC | PRN
  Administered 2018-04-25: 1 mg via INTRAVENOUS
  Filled 2018-04-25: qty 1

## 2018-04-25 MED ORDER — ACETAMINOPHEN 325 MG PO TABS: 650.0000 mg | ORAL_TABLET | ORAL | Status: DC | PRN

## 2018-04-25 MED ORDER — BUPIVACAINE HCL (PF) 0.25 % IJ SOLN
INTRAMUSCULAR | Status: DC | PRN
  Administered 2018-04-25: 20 mL

## 2018-04-25 MED ORDER — OXYCODONE HCL 5 MG PO TABS: 5.0000 mg | ORAL_TABLET | Freq: Once | ORAL | Status: DC | PRN

## 2018-04-25 MED ORDER — THROMBIN 5000 UNITS EX SOLR
CUTANEOUS | Status: AC
  Filled 2018-04-25: qty 5000

## 2018-04-25 MED ORDER — SODIUM CHLORIDE 0.9 % IV SOLN
INTRAVENOUS | Status: DC | PRN
  Administered 2018-04-25: 25 ug/min via INTRAVENOUS

## 2018-04-25 MED ORDER — HYDROMORPHONE HCL 2 MG PO TABS
2.0000 mg | ORAL_TABLET | ORAL | Status: DC | PRN
  Administered 2018-04-25 – 2018-04-26 (×4): 2 mg via ORAL
  Filled 2018-04-25 (×4): qty 1

## 2018-04-25 MED ORDER — SUGAMMADEX SODIUM 200 MG/2ML IV SOLN
INTRAVENOUS | Status: DC | PRN
  Administered 2018-04-25: 381.2 mg via INTRAVENOUS

## 2018-04-25 MED ORDER — OXYCODONE HCL 5 MG/5ML PO SOLN: 5.0000 mg | Freq: Once | ORAL | Status: DC | PRN

## 2018-04-25 MED ORDER — CHLORHEXIDINE GLUCONATE CLOTH 2 % EX PADS: 6.0000 | MEDICATED_PAD | Freq: Once | CUTANEOUS | Status: DC

## 2018-04-25 MED ORDER — DEXAMETHASONE SODIUM PHOSPHATE 10 MG/ML IJ SOLN
10.0000 mg | INTRAMUSCULAR | Status: AC
  Administered 2018-04-25: 10 mg via INTRAVENOUS
  Filled 2018-04-25: qty 1

## 2018-04-25 MED ORDER — PROPOFOL 10 MG/ML IV BOLUS
INTRAVENOUS | Status: DC | PRN
  Administered 2018-04-25: 130 mg via INTRAVENOUS

## 2018-04-25 MED ORDER — LACTATED RINGERS IV SOLN
INTRAVENOUS | Status: DC
  Administered 2018-04-25 (×2): via INTRAVENOUS

## 2018-04-25 MED ORDER — LIDOCAINE 2% (20 MG/ML) 5 ML SYRINGE
INTRAMUSCULAR | Status: AC
  Filled 2018-04-25: qty 5

## 2018-04-25 MED ORDER — CEFAZOLIN SODIUM 1 G IJ SOLR
INTRAMUSCULAR | Status: AC
  Filled 2018-04-25: qty 10

## 2018-04-25 MED ORDER — 0.9 % SODIUM CHLORIDE (POUR BTL) OPTIME
TOPICAL | Status: DC | PRN
  Administered 2018-04-25: 1000 mL

## 2018-04-25 MED ORDER — SERTRALINE HCL 50 MG PO TABS: 50.0000 mg | ORAL_TABLET | Freq: Every day | ORAL | Status: DC

## 2018-04-25 MED ORDER — GABAPENTIN 300 MG PO CAPS: 300.0000 mg | ORAL_CAPSULE | Freq: Two times a day (BID) | ORAL | Status: DC | PRN

## 2018-04-25 MED ORDER — AMLODIPINE BESYLATE 5 MG PO TABS: 2.5000 mg | ORAL_TABLET | Freq: Every day | ORAL | Status: DC

## 2018-04-25 MED ORDER — PHENYLEPHRINE 40 MCG/ML (10ML) SYRINGE FOR IV PUSH (FOR BLOOD PRESSURE SUPPORT)
PREFILLED_SYRINGE | INTRAVENOUS | Status: AC
  Filled 2018-04-25: qty 10

## 2018-04-25 MED ORDER — TIMOLOL MALEATE 0.5 % OP SOLN
1.0000 [drp] | Freq: Two times a day (BID) | OPHTHALMIC | Status: DC
  Administered 2018-04-25: 1 [drp] via OPHTHALMIC
  Filled 2018-04-25: qty 5

## 2018-04-25 MED ORDER — DIAZEPAM 5 MG PO TABS
5.0000 mg | ORAL_TABLET | Freq: Four times a day (QID) | ORAL | Status: DC | PRN
  Administered 2018-04-25: 5 mg via ORAL
  Filled 2018-04-25: qty 1

## 2018-04-25 MED ORDER — SODIUM CHLORIDE 0.9% FLUSH: 3.0000 mL | Freq: Two times a day (BID) | INTRAVENOUS | Status: DC

## 2018-04-25 MED ORDER — VANCOMYCIN HCL 1 G IV SOLR
INTRAVENOUS | Status: DC | PRN
  Administered 2018-04-25: 1000 mg via TOPICAL

## 2018-04-25 MED ORDER — BUPIVACAINE HCL (PF) 0.25 % IJ SOLN
INTRAMUSCULAR | Status: AC
  Filled 2018-04-25: qty 30

## 2018-04-25 MED ORDER — ACETAMINOPHEN 650 MG RE SUPP: 650.0000 mg | RECTAL | Status: DC | PRN

## 2018-04-25 MED ORDER — ACETAMINOPHEN 10 MG/ML IV SOLN: 1000.0000 mg | Freq: Once | INTRAVENOUS | Status: DC | PRN

## 2018-04-25 MED ORDER — FENTANYL CITRATE (PF) 250 MCG/5ML IJ SOLN
INTRAMUSCULAR | Status: AC
  Filled 2018-04-25: qty 5

## 2018-04-25 MED ORDER — CEFAZOLIN SODIUM-DEXTROSE 2-4 GM/100ML-% IV SOLN
2.0000 g | INTRAVENOUS | Status: AC
  Administered 2018-04-25: 2 g via INTRAVENOUS
  Filled 2018-04-25: qty 100

## 2018-04-25 MED ORDER — ACETAMINOPHEN 160 MG/5ML PO SOLN: 1000.0000 mg | Freq: Once | ORAL | Status: DC | PRN

## 2018-04-25 MED ORDER — THROMBIN 20000 UNITS EX SOLR
CUTANEOUS | Status: AC
  Filled 2018-04-25: qty 20000

## 2018-04-25 MED ORDER — NITROGLYCERIN 0.4 MG SL SUBL: 0.4000 mg | SUBLINGUAL_TABLET | SUBLINGUAL | Status: DC | PRN

## 2018-04-25 MED ORDER — PROPOFOL 10 MG/ML IV BOLUS
INTRAVENOUS | Status: AC
  Filled 2018-04-25: qty 20

## 2018-04-25 MED ORDER — TRAMADOL HCL 50 MG PO TABS: 50.0000 mg | ORAL_TABLET | Freq: Four times a day (QID) | ORAL | Status: DC | PRN

## 2018-04-25 MED ORDER — PHENOL 1.4 % MT LIQD: 1.0000 | OROMUCOSAL | Status: DC | PRN

## 2018-04-25 MED ORDER — LIDOCAINE 2% (20 MG/ML) 5 ML SYRINGE
INTRAMUSCULAR | Status: DC | PRN
  Administered 2018-04-25: 60 mg via INTRAVENOUS

## 2018-04-25 MED ORDER — SODIUM CHLORIDE 0.9 % IV SOLN
INTRAVENOUS | Status: DC | PRN
  Administered 2018-04-25: 500 mL

## 2018-04-25 MED ORDER — MENTHOL 3 MG MT LOZG: 1.0000 | LOZENGE | OROMUCOSAL | Status: DC | PRN

## 2018-04-25 MED ORDER — POLYETHYLENE GLYCOL 3350 17 G PO PACK: 17.0000 g | PACK | Freq: Every day | ORAL | Status: DC | PRN

## 2018-04-25 MED ORDER — GLYCOPYRROLATE PF 0.2 MG/ML IJ SOSY
PREFILLED_SYRINGE | INTRAMUSCULAR | Status: DC | PRN
  Administered 2018-04-25: .2 mg via INTRAVENOUS

## 2018-04-25 MED ORDER — SODIUM CHLORIDE 0.9 % IV SOLN
250.0000 mL | INTRAVENOUS | Status: DC
  Administered 2018-04-25: 250 mL via INTRAVENOUS

## 2018-04-25 MED ORDER — CEFAZOLIN SODIUM-DEXTROSE 1-4 GM/50ML-% IV SOLN
1.0000 g | Freq: Three times a day (TID) | INTRAVENOUS | Status: AC
  Administered 2018-04-25 – 2018-04-26 (×2): 1 g via INTRAVENOUS
  Filled 2018-04-25 (×2): qty 50

## 2018-04-25 MED ORDER — FENTANYL CITRATE (PF) 100 MCG/2ML IJ SOLN
25.0000 ug | INTRAMUSCULAR | Status: DC | PRN
  Administered 2018-04-25: 50 ug via INTRAVENOUS
  Administered 2018-04-25 (×2): 25 ug via INTRAVENOUS

## 2018-04-25 MED ORDER — BISACODYL 10 MG RE SUPP: 10.0000 mg | Freq: Every day | RECTAL | Status: DC | PRN

## 2018-04-25 MED ORDER — VANCOMYCIN HCL 1000 MG IV SOLR
INTRAVENOUS | Status: AC
  Filled 2018-04-25: qty 1000

## 2018-04-25 MED ORDER — SODIUM CHLORIDE 0.9% FLUSH: 3.0000 mL | INTRAVENOUS | Status: DC | PRN

## 2018-04-25 MED ORDER — ROCURONIUM BROMIDE 50 MG/5ML IV SOSY
PREFILLED_SYRINGE | INTRAVENOUS | Status: AC
  Filled 2018-04-25: qty 10

## 2018-04-25 MED ORDER — FLEET ENEMA 7-19 GM/118ML RE ENEM: 1.0000 | ENEMA | Freq: Once | RECTAL | Status: DC | PRN

## 2018-04-25 MED ORDER — AMIODARONE HCL 100 MG PO TABS
100.0000 mg | ORAL_TABLET | Freq: Every morning | ORAL | Status: DC
  Filled 2018-04-25: qty 1

## 2018-04-25 MED ORDER — ONDANSETRON HCL 4 MG/2ML IJ SOLN: 4.0000 mg | Freq: Four times a day (QID) | INTRAMUSCULAR | Status: DC | PRN

## 2018-04-25 MED ORDER — ALPRAZOLAM 0.25 MG PO TABS: 0.2500 mg | ORAL_TABLET | Freq: Every day | ORAL | Status: DC | PRN

## 2018-04-25 MED ORDER — ATORVASTATIN CALCIUM 40 MG PO TABS
40.0000 mg | ORAL_TABLET | Freq: Every day | ORAL | Status: DC
  Administered 2018-04-25: 40 mg via ORAL
  Filled 2018-04-25: qty 1

## 2018-04-25 MED ORDER — FENTANYL CITRATE (PF) 100 MCG/2ML IJ SOLN
INTRAMUSCULAR | Status: AC
  Administered 2018-04-25: 25 ug via INTRAVENOUS
  Filled 2018-04-25: qty 2

## 2018-04-25 SURGICAL SUPPLY — 72 items
BAG DECANTER FOR FLEXI CONT (MISCELLANEOUS) ×3 IMPLANT
BENZOIN TINCTURE PRP APPL 2/3 (GAUZE/BANDAGES/DRESSINGS) ×3 IMPLANT
BLADE CLIPPER SURG (BLADE) IMPLANT
BUR CUTTER 7.0 ROUND (BURR) IMPLANT
BUR MATCHSTICK NEURO 3.0 LAGG (BURR) ×3 IMPLANT
CANISTER SUCT 3000ML PPV (MISCELLANEOUS) ×3 IMPLANT
CAP LCK SPNE (Orthopedic Implant) ×4 IMPLANT
CAP LOCK SPINE RADIUS (Orthopedic Implant) IMPLANT
CAP LOCKING (Orthopedic Implant) ×8 IMPLANT
CARTRIDGE OIL MAESTRO DRILL (MISCELLANEOUS) ×1 IMPLANT
CLOSURE STERI-STRIP 1/2X4 (GAUZE/BANDAGES/DRESSINGS) ×1
CLOSURE WOUND 1/2 X4 (GAUZE/BANDAGES/DRESSINGS) ×2
CLSR STERI-STRIP ANTIMIC 1/2X4 (GAUZE/BANDAGES/DRESSINGS) ×1 IMPLANT
CONT SPEC 4OZ CLIKSEAL STRL BL (MISCELLANEOUS) ×3 IMPLANT
COVER BACK TABLE 60X90IN (DRAPES) ×3 IMPLANT
COVER WAND RF STERILE (DRAPES) ×3 IMPLANT
DECANTER SPIKE VIAL GLASS SM (MISCELLANEOUS) ×3 IMPLANT
DERMABOND ADVANCED (GAUZE/BANDAGES/DRESSINGS) ×2
DERMABOND ADVANCED .7 DNX12 (GAUZE/BANDAGES/DRESSINGS) ×1 IMPLANT
DEVICE INTERBODY ELEVATE 23X8 (Cage) ×4 IMPLANT
DIFFUSER DRILL AIR PNEUMATIC (MISCELLANEOUS) ×3 IMPLANT
DRAPE C-ARM 42X72 X-RAY (DRAPES) ×6 IMPLANT
DRAPE HALF SHEET 40X57 (DRAPES) ×2 IMPLANT
DRAPE LAPAROTOMY 100X72X124 (DRAPES) ×3 IMPLANT
DRAPE SURG 17X23 STRL (DRAPES) ×12 IMPLANT
DRSG OPSITE POSTOP 4X6 (GAUZE/BANDAGES/DRESSINGS) ×3 IMPLANT
DURAPREP 26ML APPLICATOR (WOUND CARE) ×3 IMPLANT
ELECT REM PT RETURN 9FT ADLT (ELECTROSURGICAL) ×3
ELECTRODE REM PT RTRN 9FT ADLT (ELECTROSURGICAL) ×1 IMPLANT
EVACUATOR 1/8 PVC DRAIN (DRAIN) IMPLANT
GAUZE 4X4 16PLY RFD (DISPOSABLE) IMPLANT
GAUZE SPONGE 4X4 12PLY STRL (GAUZE/BANDAGES/DRESSINGS) IMPLANT
GLOVE BIO SURGEON STRL SZ 6.5 (GLOVE) ×1 IMPLANT
GLOVE BIO SURGEONS STRL SZ 6.5 (GLOVE) ×1
GLOVE BIOGEL PI IND STRL 6.5 (GLOVE) IMPLANT
GLOVE BIOGEL PI IND STRL 7.0 (GLOVE) IMPLANT
GLOVE BIOGEL PI IND STRL 7.5 (GLOVE) IMPLANT
GLOVE BIOGEL PI INDICATOR 6.5 (GLOVE) ×2
GLOVE BIOGEL PI INDICATOR 7.0 (GLOVE) ×2
GLOVE BIOGEL PI INDICATOR 7.5 (GLOVE) ×2
GLOVE ECLIPSE 7.5 STRL STRAW (GLOVE) ×2 IMPLANT
GLOVE ECLIPSE 9.0 STRL (GLOVE) ×6 IMPLANT
GLOVE EXAM NITRILE XL STR (GLOVE) IMPLANT
GLOVE SURG SS PI 7.0 STRL IVOR (GLOVE) ×8 IMPLANT
GOWN STRL REUS W/ TWL LRG LVL3 (GOWN DISPOSABLE) IMPLANT
GOWN STRL REUS W/ TWL XL LVL3 (GOWN DISPOSABLE) ×2 IMPLANT
GOWN STRL REUS W/TWL 2XL LVL3 (GOWN DISPOSABLE) IMPLANT
GOWN STRL REUS W/TWL LRG LVL3 (GOWN DISPOSABLE) ×6
GOWN STRL REUS W/TWL XL LVL3 (GOWN DISPOSABLE) ×4
KIT BASIN OR (CUSTOM PROCEDURE TRAY) ×3 IMPLANT
KIT TURNOVER KIT B (KITS) ×3 IMPLANT
MILL MEDIUM DISP (BLADE) ×3 IMPLANT
NEEDLE HYPO 22GX1.5 SAFETY (NEEDLE) ×3 IMPLANT
NS IRRIG 1000ML POUR BTL (IV SOLUTION) ×3 IMPLANT
OIL CARTRIDGE MAESTRO DRILL (MISCELLANEOUS) ×3
PACK LAMINECTOMY NEURO (CUSTOM PROCEDURE TRAY) ×3 IMPLANT
ROD RADIUS 40MM (Neuro Prosthesis/Implant) ×4 IMPLANT
ROD SPNL 40X5.5XNS TI RDS (Neuro Prosthesis/Implant) IMPLANT
SCREW 5.75X40M (Screw) ×4 IMPLANT
SCREW 5.75X45MM (Screw) ×4 IMPLANT
SPACER SPNL STD 23X8XSTRL (Cage) IMPLANT
SPCR SPNL STD 23X8XSTRL (Cage) ×2 IMPLANT
SPONGE SURGIFOAM ABS GEL 100 (HEMOSTASIS) ×3 IMPLANT
STRIP CLOSURE SKIN 1/2X4 (GAUZE/BANDAGES/DRESSINGS) ×4 IMPLANT
SUT VIC AB 0 CT1 18XCR BRD8 (SUTURE) ×2 IMPLANT
SUT VIC AB 0 CT1 8-18 (SUTURE) ×2
SUT VIC AB 2-0 CT1 18 (SUTURE) ×3 IMPLANT
SUT VIC AB 3-0 SH 8-18 (SUTURE) ×6 IMPLANT
TOWEL GREEN STERILE (TOWEL DISPOSABLE) ×3 IMPLANT
TOWEL GREEN STERILE FF (TOWEL DISPOSABLE) ×3 IMPLANT
TRAY FOLEY MTR SLVR 16FR STAT (SET/KITS/TRAYS/PACK) ×3 IMPLANT
WATER STERILE IRR 1000ML POUR (IV SOLUTION) ×3 IMPLANT

## 2018-04-25 NOTE — Progress Notes (Signed)
Postop check.  Patient doing very well.  Mild incisional pain.  No lower extremity pain.  Strength and sensation intact.  Patient feels improved from preop and is happy with her progress.  Likely discharge home tomorrow.  Plan to hold Eliquis until Tuesday.

## 2018-04-25 NOTE — Transfer of Care (Signed)
Immediate Anesthesia Transfer of Care Note  Patient: Samantha [name]  Procedure(s) Performed: LUMBAR FOUR-FIVE POSTERIOR LUMBAR INTERBODY FUSION (N/A Back)  Patient Location: PACU  Anesthesia Type:General  Level of Consciousness: awake and alert   Airway & Oxygen Therapy: Patient Spontanous Breathing and Patient connected to nasal cannula oxygen  Post-op Assessment: Report given to RN and Post -op Vital signs reviewed and stable  Post vital signs: Reviewed and stable  Last Vitals:  Vitals Value Taken Time  BP 113/42 04/25/2018 12:20 PM  Temp    Pulse 55 04/25/2018 12:26 PM  Resp 12 04/25/2018 12:26 PM  SpO2 97 % 04/25/2018 12:26 PM  Vitals shown include unvalidated device data.  Last Pain:  Vitals:   04/25/18 0820  TempSrc:   PainSc: 8       Patients Stated Pain Goal: 4 (46/04/79 [id number])  Complications: No apparent anesthesia complications

## 2018-04-25 NOTE — Anesthesia Procedure Notes (Addendum)
Procedure Name: Intubation Date/Time: 04/25/2018 9:54 AM Performed by: Valda Favia, CRNA Pre-anesthesia Checklist: Patient identified, Emergency Drugs available, Suction available and Patient being monitored Patient Re-evaluated:Patient Re-evaluated prior to induction Oxygen Delivery Method: Circle System Utilized Preoxygenation: Pre-oxygenation with 100% oxygen Induction Type: IV induction Ventilation: Mask ventilation without difficulty Laryngoscope Size: Mac and 4 Grade View: Grade II Tube type: Oral Tube size: 7.0 mm Number of attempts: 1 Airway Equipment and Method: Stylet and Oral airway Placement Confirmation: ETT inserted through vocal cords under direct vision,  positive ETCO2 and breath sounds checked- equal and bilateral Secured at: 21 cm Tube secured with: Tape Dental Injury: Teeth and Oropharynx as per pre-operative assessment  Comments: DL performed by Donnamae Jude, EMT student

## 2018-04-25 NOTE — Progress Notes (Signed)
Physical Therapy Evaluation Patient Details Name: Samantha [name] MRN: [id number] DOB: [birth year] Today's Date: 04/25/2018   History of Present Illness  Patient is 78 y/o female s/p PLIF L4-5. PMH includes afib, COPD, CAD, hx of MI, coronary stent placement, and non hodgkins lymphoma.  Clinical Impression  Patient admitted to hospital secondary to problems above and with deficits below. Patient with unsteadiness upon standing with use of SPC therefore transitioned to RW with improved stability. Patient ambulated with min guard and use of RW. Reviewed back precautions and use of back brace for mobility. Educated about completing walking program at home. Patient will benefit from acute physical therapy services to maximize independence and safety with functional mobility.      Follow Up Recommendations No PT follow up;Supervision for mobility/OOB    Equipment Recommendations  Rolling walker with 5" wheels    Recommendations for Other Services       Precautions / Restrictions Precautions Precautions: Fall;Back Precaution Booklet Issued: Yes (comment) Precaution Comments: Reviewed back precautions with patient Required Braces or Orthoses: Spinal Brace Spinal Brace: Lumbar corset;Applied in sitting position Restrictions Weight Bearing Restrictions: No      Mobility  Bed Mobility Overal bed mobility: Needs Assistance Bed Mobility: Rolling;Sidelying to Sit;Sit to Sidelying Rolling: Supervision Sidelying to sit: Supervision     Sit to sidelying: Min guard General bed mobility comments: Patient required supervision-minguard for all bed mobility. Verbal cues for log roll technique and sequencing to maintain back precautions.   Transfers Overall transfer level: Needs assistance Equipment used: Straight cane Transfers: Sit to/from Stand Sit to Stand: Min guard;From elevated surface         General transfer comment: Patient required min guard to stand for safety. Unable to stand on  first attempt therefore elevated bed to decrease difficulty. Patient with noticable unsteadiness upon standing with cane therefore used RW for further mobilty with improved stability   Ambulation/Gait Ambulation/Gait assistance: Min guard Gait Distance (Feet): 300 Feet Assistive device: Rolling walker (2 wheeled) Gait Pattern/deviations: Step-through pattern;Decreased step length - right;Decreased step length - left;Decreased stride length Gait velocity: decreased Gait velocity interpretation: <1.8 ft/sec, indicate of risk for recurrent falls General Gait Details: Patient ambulated with min guard and use of RW for safety. Some shakiness noted however no overt LOB noted. Verbal cues for proximity to RW and for sequencing.   Stairs            Wheelchair Mobility    Modified Rankin (Stroke Patients Only)       Balance Overall balance assessment: Needs assistance Sitting-balance support: Feet supported;No upper extremity supported Sitting balance-Leahy Scale: Good     Standing balance support: Single extremity supported;Bilateral upper extremity supported Standing balance-Leahy Scale: Poor Standing balance comment: Patient with unsteadiness in standing with SPC. Improved stability with use of RW.                              Pertinent Vitals/Pain Pain Assessment: 0-10 Pain Score: 8  Pain Location: back Pain Descriptors / Indicators: Discomfort;Grimacing;Operative site guarding Pain Intervention(s): Limited activity within patient's tolerance;Monitored during session;Repositioned    Home Living Family/patient expects to be discharged to:: Private residence Living Arrangements: Alone Available Help at Discharge: Family;Available PRN/intermittently Type of Home: House Home Access: Stairs to enter Entrance Stairs-Rails: Right;Left;Can reach both Entrance Stairs-Number of Steps: 3 Home Layout: One level Home Equipment: Cane - single point;Shower seat - built  in Additional Comments: Patient reports someone  can stay with her the first couple of days    Prior Function Level of Independence: Independent with assistive device(s)         Comments: reports using a cane at baseline     Hand Dominance        Extremity/Trunk Assessment   Upper Extremity Assessment Upper Extremity Assessment: Defer to OT evaluation    Lower Extremity Assessment Lower Extremity Assessment: Generalized weakness    Cervical / Trunk Assessment Cervical / Trunk Assessment: Normal  Communication   Communication: No difficulties  Cognition Arousal/Alertness: Awake/alert Behavior During Therapy: WFL for tasks assessed/performed Overall Cognitive Status: Within Functional Limits for tasks assessed                                        General Comments General comments (skin integrity, edema, etc.): Patient son in room at end of session. Educated about walking program at home.     Exercises     Assessment/Plan    PT Assessment Patient needs continued PT services  PT Problem List Decreased strength;Decreased range of motion;Decreased activity tolerance;Decreased balance;Decreased mobility;Decreased knowledge of use of DME;Decreased knowledge of precautions;Pain       PT Treatment Interventions DME instruction;Gait training;Stair training;Functional mobility training;Therapeutic activities;Therapeutic exercise;Balance training;Patient/family education    PT Goals (Current goals can be found in the Care Plan section)  Acute Rehab PT Goals Patient Stated Goal: go home PT Goal Formulation: With patient Time For Goal Achievement: 05/09/18 Potential to Achieve Goals: Good    Frequency Min 5X/week   Barriers to discharge Decreased caregiver support      Co-evaluation               AM-PAC PT "6 Clicks" Mobility  Outcome Measure Help needed turning from your back to your side while in a flat bed without using bedrails?: A  Little Help needed moving from lying on your back to sitting on the side of a flat bed without using bedrails?: A Little Help needed moving to and from a bed to a chair (including a wheelchair)?: A Little Help needed standing up from a chair using your arms (e.g., wheelchair or bedside chair)?: A Little Help needed to walk in hospital room?: A Little Help needed climbing 3-5 steps with a railing? : A Little 6 Click Score: 18    End of Session Equipment Utilized During Treatment: Gait belt;Back brace Activity Tolerance: Patient tolerated treatment well Patient left: in bed;with call bell/phone within reach;with nursing/sitter in room;with family/visitor present Nurse Communication: Mobility status PT Visit Diagnosis: Unsteadiness on feet (R26.81);Other abnormalities of gait and mobility (R26.89)    Time: 7048-8891 PT Time Calculation (min) (ACUTE ONLY): 26 min   Charges:   PT Evaluation $PT Eval Low Complexity: 1 Low PT Treatments $Gait Training: 8-22 mins        Erick Blinks, SPT  Erick Blinks 04/25/2018, 6:22 PM

## 2018-04-25 NOTE — Op Note (Signed)
Date of procedure: 04/25/2018  Date of dictation: Same  Service: Neurosurgery  Preoperative diagnosis: L4-5 severe foraminal stenosis with radiculopathy  Postoperative diagnosis: Same  Procedure Name: Bilateral L4-5 decompressive laminotomies and foraminotomies, more than would be required for simple interbody fusion alone.  L4-5 posterior lumbar interbody fusion utilizing interbody cages, locally harvested autograft.  L4-5 posterior lateral arthrodesis utilizing nonsegmental pedicle screw fixation and local autograft  Surgeon:Mahir Prabhakar A.Brodi Kari, M.D.  Asst. Surgeon: Reinaldo Meeker, NP  Anesthesia: General  Indication: 78 year old female with severe back and bilateral lower extremity pain failing conservative manage.  Work-up demonstrates evidence of significant lateral recess and severe foraminal stenosis at L4-5.  Patient is failed conservative management.  She presents now for decompression and fusion in hopes of improving her symptoms.  Operative note: After induction of anesthesia, patient position prone onto Wilson frame and properly padded.  Lumbar region prepped and draped sterilely.  Incision made overlying L4-5.  Dissection performed bilaterally.  Retractor placed.  Fluoroscopy used.  Levels confirmed.  Laminotomy performed using high-speed drill Kerrison Rogers and Leksell rongeurs to remove the inferior two thirds the lamina of L4 the inferior facet and pars interarticularis of L4 bilaterally and the superior aspect of the superior facet of L5 as well as the superior aspect of the L5 lamina.  Ligament flavum elevated and resected.  Foraminotomies completed on the course exiting L4 and L5 nerve roots.  Bilateral discectomies then performed.  The space then prepared for interbody fusion.  With distractor placed the patient's right side to space further cleaned of soft tissue.  8 mm [employer] expandable cage packed with morselized autograft which was obtained locally was then impacted into place  and expanded to its full extent.  Distractor removed patient's right side.  The space prepared on the right side.  Morselized autograft packed in the interspace.  Second cage packed with autograft was then impacted in place and expanded to its full extent.  Pedicles of L4 and L5 were identified using surface landmarks and intraoperative fluoroscopy of superficial bone around the pedicle was then removed using high-speed drill.  Pedicle was then probed using pedicle all each pedicle tract was then probed and found to be solidly within the bone.  Each pedicle tract was then tapped and then 5.75 mm radius brand screws from [employer] were placed bilaterally at L4 and L5.  Final images reveal good position of the cages and the hardware at the proper upper level with normal alignment of spine.  Wound is irrigated one final time.  Gelfoam was placed topically for hemostasis.  Short segment titanium rod placed over the screw heads.  Locking caps and engaged.  Locking caps given final tightening.  Morselized autograft was packed posterior laterally along the transverse processes and residual facets for later fusion.  Vancomycin powder was placed in the deep wound space.  Wounds and closed in layers with Vicryl sutures.  Steri-Strips and sterile dressing were applied.  No apparent complications.  Patient tolerated the procedure well and she returns to the recovery room postop.

## 2018-04-25 NOTE — Brief Op Note (Signed)
04/25/2018  12:11 PM  PATIENT:  Velora Mediate  78 y.o. female  PRE-OPERATIVE DIAGNOSIS:  Stenosis  POST-OPERATIVE DIAGNOSIS:  Stenosis  PROCEDURE:  Procedure(s): LUMBAR FOUR-FIVE POSTERIOR LUMBAR INTERBODY FUSION (N/A)  SURGEON:  Surgeon(s) and Role:    * Earnie Larsson, MD - Primary  PHYSICIAN ASSISTANT:   ASSISTANTSReinaldo Meeker, NP   ANESTHESIA:   general  EBL:  100 mL   BLOOD ADMINISTERED:none  DRAINS: none   LOCAL MEDICATIONS USED:  MARCAINE     SPECIMEN:  No Specimen  DISPOSITION OF SPECIMEN:  N/A  COUNTS:  YES  TOURNIQUET:  * No tourniquets in log *  DICTATION: .Dragon Dictation  PLAN OF CARE: Admit to inpatient   PATIENT DISPOSITION:  PACU - hemodynamically stable.   Delay start of Pharmacological VTE agent (>24hrs) due to surgical blood loss or risk of bleeding: yes

## 2018-04-25 NOTE — H&P (Signed)
Samantha [name] is an 78 y.o. female.   Chief Complaint: Back pain HPI: 78 year old female with severe back and bilateral lower extremity pain failing conservative management.  Work-up demonstrates evidence of marked multilevel disc degeneration most severe at L4-5 with marked disc space collapse and severe foraminal stenosis.  Patient has failed conservative management.  She presents now for decompression and fusion surgery in hopes of improving her symptoms.  Past Medical History:  Diagnosis Date  . A-fib ([place])   . Arthritis   . Chronic back pain   . COPD (chronic obstructive pulmonary disease) ([place])   . Coronary artery disease   . Glaucoma   . Hearing aid worn   . High cholesterol   . Hypertension   . Lymphadenopathy 01/25/2016  . Myocardial infarction ([place])    2000  . Non Hodgkin's lymphoma ([place])   . Wears dentures    " Top plate"  . Wears glasses     Past Surgical History:  Procedure Laterality Date  . CARDIAC CATHETERIZATION    . CATARACT EXTRACTION W/ INTRAOCULAR LENS IMPLANT    . CHOLECYSTECTOMY    . COLONOSCOPY W/ BIOPSIES AND POLYPECTOMY    . CORONARY STENT PLACEMENT    . INGUINAL LYMPH NODE BIOPSY Right 01/25/2016   Procedure: EXCISION DEEP RIGHT INGUINAL LYMPH NODE BIOPSY WITH ULTRA SOUND;  Surgeon: Fanny Skates, MD;  Location: [facility];  Service: General;  Laterality: Right;    Family History  Problem Relation Age of Onset  . Hypertension Mother   . Stroke Mother   . Heart disease Father   . Heart disease Other    Social History:  reports that she has been smoking cigarettes. She has been smoking about 0.50 packs per day. She has never used smokeless tobacco. She reports current alcohol use. She reports that she does not use drugs.  Allergies:  Allergies  Allergen Reactions  . Hydrocodone Rash    Itching, rash  . Oxycodone Rash    Medications Prior to Admission  Medication Sig Dispense Refill  . ALPRAZolam (XANAX) 0.25 MG tablet Take 0.25 mg by mouth  daily as needed for anxiety.     Marland Kitchen amiodarone (PACERONE) 200 MG tablet Take 100 mg by mouth every morning.    Marland Kitchen amLODipine (NORVASC) 5 MG tablet Take 2.5 mg by mouth daily.    Marland Kitchen atorvastatin (LIPITOR) 40 MG tablet Take 40 mg by mouth every morning.     Marland Kitchen ELIQUIS 5 MG TABS tablet Take 5 mg by mouth 2 (two) times daily.    Marland Kitchen gabapentin (NEURONTIN) 300 MG capsule Take 300 mg by mouth 2 (two) times daily as needed (neuropathy).     . meloxicam (MOBIC) 7.5 MG tablet Take 7.5 mg by mouth at bedtime.    . metoprolol succinate (TOPROL-XL) 50 MG 24 hr tablet Take 50 mg by mouth daily.    . sertraline (ZOLOFT) 50 MG tablet Take 1 tablet (50 mg total) by mouth at bedtime. 90 tablet 3  . timolol (TIMOPTIC) 0.5 % ophthalmic solution Place 1 drop into both eyes 2 (two) times daily. Instill 1 drop in each eye in the morning and one drop in each eye at night    . nitroGLYCERIN (NITROSTAT) 0.4 MG SL tablet Place 0.4 mg under the tongue every 5 (five) minutes x 3 doses as needed for chest pain.     . traMADol (ULTRAM) 50 MG tablet Take 1-2 tablets (50-100 mg total) by mouth every 6 (six) hours as needed.  30 tablet 1    No results found for this or any previous visit (from the past 48 hour(s)). No results found.  Pertinent items noted in HPI and remainder of comprehensive ROS otherwise negative.  Blood pressure (!) 165/88, pulse (!) 57, temperature 98 F (36.7 C), temperature source Oral, resp. rate 18, height 5\' 9"  (1.753 m), weight 95.3 kg, SpO2 98 %.  Patient is awake and alert.  She is oriented and appropriate.  Speech is fluent.  Judgment and insight are intact.  Cranial nerve function normal bilaterally.  Motor examination with mild dorsiflexion weakness bilaterally.  Otherwise motor strength intact.  Sensory examination with decreased sensation pinprick and light touch in her L5 dermatomes bilaterally.  Reflexes normal active step her up patellar reflexes are diminished in her Achilles reflexes are absent.   No evidence of long track signs.  Gait antalgic.  Posture flexed.  Examination head ears eyes nose throat is unremarkable her chest and abdomen are benign. Assessment/Plan L4-5 severe degenerative disc disease with severe lateral recess and foraminal stenosis requiring extensive facetectomy for complete decompression.  Plan bilateral L4-5 decompressive laminotomies and foraminotomies followed by posterior lumbar interbody fusion utilizing interbody cages and locally harvested autograft cup with posterior arthrodesis utilizing nonsegmental pedicle screw fixation and local autografting.  Risks and benefits of been explained.  Patient wishes to proceed.  Mallie Mussel A Lorina Duffner 04/25/2018, 9:08 AM

## 2018-04-26 MED ORDER — HYDROMORPHONE HCL 2 MG PO TABS: 2.0000 mg | ORAL_TABLET | ORAL | 0 refills | Status: DC | PRN

## 2018-04-26 NOTE — Progress Notes (Signed)
Physical Therapy Treatment Patient Details Name: Samantha [name] MRN: [id number] DOB: [birth year] Today's Date: 04/26/2018    History of Present Illness Patient is 78 y/o female s/p PLIF L4-5. PMH includes afib, COPD, CAD, hx of MI, coronary stent placement, and non hodgkins lymphoma.    PT Comments    Patient progressing well towards PT goals. Tolerated stair training today with Min guard assist for balance/safety. Pt able to recall 3/3 back precautions without cues. Tolerated gait training with Min guard assist and use of RW. Some mild shakiness noted but no overt LOB. 2 standing rest breaks needed with 2/4 DOE. Pt will have a friend staying with her at d/c. Recommend using RW until strength/mobility improve. Education re: car transfer, positioning, exercise etc. Plans to d/c today. Will follow.    Follow Up Recommendations  No PT follow up;Supervision for mobility/OOB     Equipment Recommendations  None recommended by PT    Recommendations for Other Services       Precautions / Restrictions Precautions Precautions: Fall;Back Precaution Booklet Issued: Yes (comment) Precaution Comments: Reviewed back precautions with patient Required Braces or Orthoses: Spinal Brace Spinal Brace: Lumbar corset;Applied in sitting position Restrictions Weight Bearing Restrictions: No    Mobility  Bed Mobility Overal bed mobility: Needs Assistance Bed Mobility: Rolling;Sidelying to Sit Rolling: Supervision(although using siderail) Sidelying to sit: Supervision       General bed mobility comments: Sitting EOB upon PT arrival.   Transfers Overall transfer level: Needs assistance Equipment used: Rolling walker (2 wheeled) Transfers: Sit to/from Stand Sit to Stand: Min guard         General transfer comment: Min guard assist for safety and stability Stood from EOB x1, from toilet x1.   Ambulation/Gait Ambulation/Gait assistance: Min guard Gait Distance (Feet): 350 Feet Assistive  device: Rolling walker (2 wheeled) Gait Pattern/deviations: Step-through pattern;Decreased step length - right;Decreased step length - left;Decreased stride length Gait velocity: decreased   General Gait Details: Patient ambulated with min guard and use of RW for safety. Some shakiness noted however no overt LOB. Cues for RW proximity. 2 standing rest breaks.   Stairs Stairs: Yes Stairs assistance: Min guard Stair Management: Two rails;Step to pattern Number of Stairs: 3 General stair comments: Cues for safety/technique.   Wheelchair Mobility    Modified Rankin (Stroke Patients Only)       Balance Overall balance assessment: Needs assistance Sitting-balance support: Feet supported;No upper extremity supported Sitting balance-Leahy Scale: Good     Standing balance support: During functional activity Standing balance-Leahy Scale: Poor Standing balance comment: Relies on BUE support from RW; able to stand at sink and wash hands without UE support leaning with hips.                            Cognition Arousal/Alertness: Awake/alert Behavior During Therapy: WFL for tasks assessed/performed Overall Cognitive Status: Within Functional Limits for tasks assessed                                        Exercises      General Comments General comments (skin integrity, edema, etc.): No family present during this session.       Pertinent Vitals/Pain Pain Assessment: Faces Faces Pain Scale: Hurts a little bit Pain Location: back Pain Descriptors / Indicators: Discomfort;Grimacing;Operative site guarding Pain Intervention(s): Monitored during session;Repositioned  Home Living Family/patient expects to be discharged to:: Private residence Living Arrangements: Alone Available Help at Discharge: Available PRN/intermittently;Friend(s)(encouraged 24/7 initially) Type of Home: House Home Access: Stairs to enter Entrance Stairs-Rails: Right;Left;Can  reach both Home Layout: One [place] - single point;Adaptive equipment;Shower seat Additional Comments: Patient reports someone can stay with her the first couple of days    Prior Function Level of Independence: Independent with assistive device(s)      Comments: reports using a cane at baseline   PT Goals (current goals can now be found in the care plan section) Acute Rehab PT Goals Patient Stated Goal: go home Progress towards PT goals: Progressing toward goals    Frequency    Min 5X/week      PT Plan Current plan remains appropriate    Co-evaluation              AM-PAC PT "6 Clicks" Mobility   Outcome Measure  Help needed turning from your back to your side while in a flat bed without using bedrails?: A Little Help needed moving from lying on your back to sitting on the side of a flat bed without using bedrails?: A Little Help needed moving to and from a bed to a chair (including a wheelchair)?: A Little Help needed standing up from a chair using your arms (e.g., wheelchair or bedside chair)?: A Little Help needed to walk in hospital room?: A Little Help needed climbing 3-5 steps with a railing? : A Little 6 Click Score: 18    End of Session Equipment Utilized During Treatment: Gait belt;Back brace Activity Tolerance: Patient tolerated treatment well Patient left: with call bell/phone within reach;in bed(sitting EOB) Nurse Communication: Mobility status PT Visit Diagnosis: Unsteadiness on feet (R26.81);Other abnormalities of gait and mobility (R26.89);Pain Pain - part of body: (back)     Time: 9381-0175 PT Time Calculation (min) (ACUTE ONLY): 21 min  Charges:  $Gait Training: 8-22 mins                     Wray Kearns, PT, DPT Acute Rehabilitation Services Pager [phone] Office [phone]       [place] 04/26/2018, 9:57 AM

## 2018-04-26 NOTE — Discharge Summary (Signed)
Physician Discharge Summary  Patient ID: Samantha [name] MRN: [id number] DOB/AGE: Sep 18, 1940 78 y.o.  Admit date: 04/25/2018 Discharge date: 04/26/2018  Admission Diagnoses:L4-5 severe foraminal stenosis with radiculopathy  Discharge Diagnoses: L4-5 severe foraminal stenosis with radiculopathy Active Problems:   Lumbar foraminal stenosis   Discharged Condition: good  Hospital Course: Patient underwent Bilateral L4-5 decompressive laminotomies and foraminotomies, more than would be required for simple interbody fusion alone.  L4-5 posterior lumbar interbody fusion utilizing interbody cages, locally harvested autograft.  L4-5 posterior lateral arthrodesis utilizing nonsegmental pedicle screw fixation and local autograft  She did well with this surgery and was discharged home on AM of POD 1.   Consults: None  Significant Diagnostic Studies: None  Treatments: surgery: Bilateral L4-5 decompressive laminotomies and foraminotomies, more than would be required for simple interbody fusion alone.  L4-5 posterior lumbar interbody fusion utilizing interbody cages, locally harvested autograft.  L4-5 posterior lateral arthrodesis utilizing nonsegmental pedicle screw fixation and local autograft  Discharge Exam: Blood pressure (!) 126/51, pulse 60, temperature 98.1 F (36.7 C), temperature source Oral, resp. rate 18, height 5\' 9"  (1.753 m), weight 95.3 kg, SpO2 92 %. Neurologic: Alert and oriented X 3, normal strength and tone. Normal symmetric reflexes. Normal coordination and gait Wound:CDI  Disposition: Home  Discharge Instructions    Diet - low sodium heart healthy   Complete by:  As directed    Increase activity slowly   Complete by:  As directed      Allergies as of 04/26/2018      Reactions   Hydrocodone Rash   Itching, rash   Oxycodone Rash      Medication List    STOP taking these medications   meloxicam 7.5 MG tablet Commonly known as:  MOBIC     TAKE these  medications   ALPRAZolam 0.25 MG tablet Commonly known as:  XANAX Take 0.25 mg by mouth daily as needed for anxiety.   amiodarone 200 MG tablet Commonly known as:  PACERONE Take 100 mg by mouth every morning.   amLODipine 5 MG tablet Commonly known as:  NORVASC Take 2.5 mg by mouth daily.   atorvastatin 40 MG tablet Commonly known as:  LIPITOR Take 40 mg by mouth every morning.   ELIQUIS 5 MG Tabs tablet Generic drug:  apixaban Take 5 mg by mouth 2 (two) times daily.   gabapentin 300 MG capsule Commonly known as:  NEURONTIN Take 300 mg by mouth 2 (two) times daily as needed (neuropathy).   HYDROmorphone 2 MG tablet Commonly known as:  DILAUDID Take 1 tablet (2 mg total) by mouth every 4 (four) hours as needed for severe pain.   metoprolol succinate 50 MG 24 hr tablet Commonly known as:  TOPROL-XL Take 50 mg by mouth daily.   nitroGLYCERIN 0.4 MG SL tablet Commonly known as:  NITROSTAT Place 0.4 mg under the tongue every 5 (five) minutes x 3 doses as needed for chest pain.   sertraline 50 MG tablet Commonly known as:  ZOLOFT Take 1 tablet (50 mg total) by mouth at bedtime.   timolol 0.5 % ophthalmic solution Commonly known as:  TIMOPTIC Place 1 drop into both eyes 2 (two) times daily. Instill 1 drop in each eye in the morning and one drop in each eye at night   traMADol 50 MG tablet Commonly known as:  ULTRAM Take 1-2 tablets (50-100 mg total) by mouth every 6 (six) hours as needed.  Durable Medical Equipment  (From admission, onward)         Start     Ordered   04/25/18 1415  DME Walker rolling  Once    Question:  Patient needs a walker to treat with the following condition  Answer:  Lumbar foraminal stenosis   04/25/18 1414   04/25/18 1415  DME 3 n 1  Once     04/25/18 1414           Signed: Peggyann Shoals, MD 04/26/2018, 6:40 AM

## 2018-04-26 NOTE — Progress Notes (Signed)
Subjective: Patient reports doing well  Objective: Vital signs in last 24 hours: Temp:  [97.5 F (36.4 C)-99.3 F (37.4 C)] 98.1 F (36.7 C) (02/08 0435) Pulse Rate:  [52-64] 60 (02/08 0435) Resp:  [12-20] 18 (02/08 0435) BP: (105-166)/(39-88) 126/51 (02/08 0435) SpO2:  [88 %-99 %] 92 % (02/08 0435) Weight:  [95.3 kg] 95.3 kg (02/07 0757)  Intake/Output from previous day: 02/07 0701 - 02/08 0700 In: 1924.7 [P.O.:360; I.V.:1314.7; IV Piggyback:100] Out: 430 [Urine:330; Blood:100] Intake/Output this shift: Total I/O In: 774.7 [P.O.:360; I.V.:314.7; IV Piggyback:100] Out: -   Physical Exam: Full strength.  Dressing CDI.  Lab Results: No results for input(s): WBC, HGB, HCT, PLT in the last 72 hours. BMET No results for input(s): NA, K, CL, CO2, GLUCOSE, BUN, CREATININE, CALCIUM in the last 72 hours.  Studies/Results: Dg Lumbar Spine 2-3 Views  Result Date: 04/25/2018 CLINICAL DATA:  Lumbar fusion. EXAM: DG C-ARM 61-120 MIN; LUMBAR SPINE - 2-3 VIEW COMPARISON:  Radiographs 04/10/2018 FINDINGS: Bilateral pedicle screws at L4 and L5 are well position without complicating features. Interbody fusion device is well positioned at L4-5. IMPRESSION: Fusion hardware at L4-5 in good position without complicating features. Electronically Signed   By: Marijo Sanes M.D.   On: 04/25/2018 12:33   Dg C-arm 1-60 Min  Result Date: 04/25/2018 CLINICAL DATA:  Lumbar fusion. EXAM: DG C-ARM 61-120 MIN; LUMBAR SPINE - 2-3 VIEW COMPARISON:  Radiographs 04/10/2018 FINDINGS: Bilateral pedicle screws at L4 and L5 are well position without complicating features. Interbody fusion device is well positioned at L4-5. IMPRESSION: Fusion hardware at L4-5 in good position without complicating features. Electronically Signed   By: Marijo Sanes M.D.   On: 04/25/2018 12:33    Assessment/Plan: Patient is doing well.  Discharge home.    LOS: 1 day    Peggyann Shoals, MD 04/26/2018, 6:36 AM

## 2018-04-26 NOTE — Evaluation (Signed)
Occupational Therapy Evaluation Patient Details Name: Samantha [name] MRN: [id number] DOB: February 06, 1941 Today's Date: 04/26/2018    History of Present Illness Patient is 78 y/o female s/p PLIF L4-5. PMH includes afib, COPD, CAD, hx of MI, coronary stent placement, and non hodgkins lymphoma.   Clinical Impression   PTA, pt was functioning at a modified independent level with ADL and functional mobility and living alone. She is currently limited by bilateral lower extremity and lower back pain/soreness. She requires min assist for LB ADL and min guard assist for toilet and shower transfers at this time. Educated pt concerning compensatory strategies as well as use of AE for LB ADL. Also discussed use of 3-in-1 over commode to improve independence and safety with toilet transfers. She verbalizes and demonstrates understanding. OT will continue to follow while admitted. Recommend initial 24 hour assistance at home. She states friends are coming to stay with her.     Follow Up Recommendations  No OT follow up;Supervision/Assistance - 24 hour    Equipment Recommendations  3 in 1 bedside commode    Recommendations for Other Services       Precautions / Restrictions Precautions Precautions: Fall;Back Precaution Booklet Issued: Yes (comment) Precaution Comments: Reviewed back precautions with patient Required Braces or Orthoses: Spinal Brace Spinal Brace: Lumbar corset;Applied in sitting position(off for showering) Restrictions Weight Bearing Restrictions: No      Mobility Bed Mobility Overal bed mobility: Needs Assistance Bed Mobility: Rolling;Sidelying to Sit Rolling: Supervision(although using siderail) Sidelying to sit: Supervision       General bed mobility comments: Pt relying on guard rail for assistance  Transfers Overall transfer level: Needs assistance Equipment used: Straight cane Transfers: Sit to/from Stand Sit to Stand: Min guard;From elevated surface          General transfer comment: Min guard assist for safety and stability    Balance Overall balance assessment: Needs assistance Sitting-balance support: Feet supported;No upper extremity supported Sitting balance-Leahy Scale: Good     Standing balance support: Single extremity supported;Bilateral upper extremity supported Standing balance-Leahy Scale: Poor Standing balance comment: Relies on BUE support from RW                           ADL either performed or assessed with clinical judgement   ADL Overall ADL's : Needs assistance/impaired Eating/Feeding: Set up;Sitting   Grooming: Supervision/safety;Standing Grooming Details (indicate cue type and reason): educated on strategies to avoid bending Upper Body Bathing: Modified independent;Sitting   Lower Body Bathing: Sit to/from stand;Min guard   Upper Body Dressing : Modified independent;Sitting   Lower Body Dressing: Sit to/from stand;Minimal assistance;With adaptive equipment Lower Body Dressing Details (indicate cue type and reason): educated on use of reacher for assistance with LB clothing Toilet Transfer: Min guard;Ambulation;RW;Comfort height toilet Toilet Transfer Details (indicate cue type and reason): recommended 3-in-1 and notified RN Toileting- Clothing Manipulation and Hygiene: Sit to/from stand;Min guard   Tub/ Shower Transfer: Min guard;Ambulation;Shower seat;Grab bars   Functional mobility during ADLs: Min guard;Rolling walker General ADL Comments: Educated pt concerning use of compensatory strategies     Vision Patient Visual Report: No change from baseline Vision Assessment?: No apparent visual deficits     Perception     Praxis      Pertinent Vitals/Pain Pain Assessment: Faces Faces Pain Scale: Hurts a little bit Pain Location: back Pain Descriptors / Indicators: Discomfort;Grimacing;Operative site guarding Pain Intervention(s): Limited activity within patient's tolerance;Monitored during  session;Repositioned  Hand Dominance     Extremity/Trunk Assessment Upper Extremity Assessment Upper Extremity Assessment: Defer to OT evaluation   Lower Extremity Assessment Lower Extremity Assessment: Generalized weakness   Cervical / Trunk Assessment Cervical / Trunk Assessment: Normal   Communication Communication Communication: No difficulties   Cognition Arousal/Alertness: Awake/alert Behavior During Therapy: WFL for tasks assessed/performed Overall Cognitive Status: Within Functional Limits for tasks assessed                                     General Comments  No family present during this session.     Exercises     Shoulder Instructions      Home Living Family/patient expects to be discharged to:: Private residence Living Arrangements: Alone Available Help at Discharge: Available PRN/intermittently;Friend(s)(encouraged 24/7 initially) Type of Home: House Home Access: Stairs to enter [employer] of Steps: 3 Entrance Stairs-Rails: Right;Left;Can reach both Home Layout: One level     Bathroom Shower/Tub: Occupational psychologist: [place] - single point;Adaptive equipment;Shower Theme park manager: Financial trader Comments: Patient reports someone can stay with her the first couple of days      Prior Functioning/Environment Level of Independence: Independent with assistive device(s)        Comments: reports using a cane at baseline        OT Problem List: Decreased strength;Decreased activity tolerance;Decreased range of motion;Impaired balance (sitting and/or standing);Decreased safety awareness;Decreased knowledge of use of DME or AE;Decreased knowledge of precautions;Pain      OT Treatment/Interventions: Self-care/ADL training;Therapeutic exercise;Energy conservation;DME and/or AE instruction;Therapeutic activities;Patient/family education;Balance training    OT  Goals(Current goals can be found in the care plan section) Acute Rehab OT Goals Patient Stated Goal: go home OT Goal Formulation: With patient Time For Goal Achievement: 05/10/18 Potential to Achieve Goals: Good ADL Goals Pt Will Perform Grooming: with modified independence;standing Pt Will Perform Lower Body Dressing: with modified independence;with adaptive equipment;sit to/from stand Pt Will Transfer to Toilet: with modified independence;ambulating;bedside commode;regular height toilet(BSC over toilet) Pt Will Perform Toileting - Clothing Manipulation and hygiene: with modified independence;sit to/from stand Pt Will Perform Tub/Shower Transfer: with modified independence;ambulating;shower seat;grab bars;rolling walker  OT Frequency: Min 1X/week   Barriers to D/C:            Co-evaluation              AM-PAC OT "6 Clicks" Daily Activity     Outcome Measure Help from another person eating meals?: None Help from another person taking care of personal grooming?: A Little Help from another person toileting, which includes using toliet, bedpan, or urinal?: A Little Help from another person bathing (including washing, rinsing, drying)?: A Little Help from another person to put on and taking off regular upper body clothing?: None Help from another person to put on and taking off regular lower body clothing?: A Little 6 Click Score: 20   End of Session Equipment Utilized During Treatment: Rolling walker;Back brace Nurse Communication: Mobility status  Activity Tolerance: Patient tolerated treatment well Patient left: in bed;with call bell/phone within reach(seated at EOB waiting for PT)  OT Visit Diagnosis: Other abnormalities of gait and mobility (R26.89);Pain Pain - part of body: (back; bilateral LE)                Time: 3267-1245 OT Time Calculation (min): 23 min Charges:  OT General Charges $OT  Visit: 1 Visit OT Evaluation $OT Eval Moderate Complexity: 1 Mod OT  Treatments $Self Care/Home Management : 8-22 mins  [place] [facility] [place] A Jesse Nosbisch 04/26/2018, 9:16 AM

## 2018-04-26 NOTE — Discharge Instructions (Signed)
HOLD ELIQUIS UNTIL Tuesday  Wound Care Keep incision covered and dry for two days.    Do not put any creams, lotions, or ointments on incision. Leave steri-strips on back.  They will fall off by themselves. Activity Walk each and every day, increasing distance each day. No lifting greater than 5 lbs.  Avoid excessive neck motion. No driving for 2 weeks; may ride as a passenger locally. If provided with back brace, wear when out of bed.  It is not necessary to wear brace in bed. Diet Resume your normal diet.  Return to Work Will be discussed at you follow up appointment. Call Your Doctor If Any of These Occur Redness, drainage, or swelling at the wound.  Temperature greater than 101 degrees. Severe pain not relieved by pain medication. Incision starts to come apart. Follow Up Appt Call today for appointment in 1-2 weeks ([phone]) or for problems.  If you have any hardware placed in your spine, you will need an x-ray before your appointment.

## 2018-04-30 MED FILL — Heparin Sodium (Porcine) Inj 1000 Unit/ML: INTRAMUSCULAR | Qty: 30 | Status: AC

## 2018-04-30 MED FILL — Sodium Chloride IV Soln 0.9%: INTRAVENOUS | Qty: 1000 | Status: AC

## 2018-05-05 NOTE — Anesthesia Postprocedure Evaluation (Signed)
Anesthesia Post Note  Patient: JEANEE FABRE  Procedure(s) Performed: LUMBAR FOUR-FIVE POSTERIOR LUMBAR INTERBODY FUSION (N/A Back)     Patient location during evaluation: PACU Anesthesia Type: General Level of consciousness: awake and alert Pain management: pain level controlled Vital Signs Assessment: post-procedure vital signs reviewed and stable Respiratory status: spontaneous breathing, nonlabored ventilation, respiratory function stable and patient connected to nasal cannula oxygen Cardiovascular status: blood pressure returned to baseline and stable Postop Assessment: no apparent nausea or vomiting Anesthetic complications: no    Last Vitals:  Vitals:   04/26/18 0435 04/26/18 0713  BP: (!) 126/51 102/67  Pulse: 60 (!) 58  Resp: 18 16  Temp: 36.7 C 36.9 C  SpO2: 92% 94%    Last Pain:  Vitals:   04/26/18 0713  TempSrc: Oral  PainSc:                  Dakarri Kessinger

## 2018-05-07 DIAGNOSIS — M1990 Unspecified osteoarthritis, unspecified site: Secondary | ICD-10-CM | POA: Diagnosis not present

## 2018-05-07 DIAGNOSIS — E785 Hyperlipidemia, unspecified: Secondary | ICD-10-CM | POA: Diagnosis not present

## 2018-05-07 DIAGNOSIS — I480 Paroxysmal atrial fibrillation: Secondary | ICD-10-CM | POA: Diagnosis not present

## 2018-05-07 DIAGNOSIS — I10 Essential (primary) hypertension: Secondary | ICD-10-CM | POA: Diagnosis not present

## 2018-05-07 DIAGNOSIS — F17290 Nicotine dependence, other tobacco product, uncomplicated: Secondary | ICD-10-CM | POA: Diagnosis not present

## 2018-05-26 ENCOUNTER — Ambulatory Visit (INDEPENDENT_AMBULATORY_CARE_PROVIDER_SITE_OTHER): Payer: PPO | Admitting: Family

## 2018-05-26 ENCOUNTER — Encounter: Payer: Self-pay | Admitting: Family

## 2018-05-26 VITALS — BP 110/50 | HR 67 | Temp 97.9°F | Ht 69.0 in | Wt 215.2 lb

## 2018-05-26 DIAGNOSIS — M48061 Spinal stenosis, lumbar region without neurogenic claudication: Secondary | ICD-10-CM | POA: Diagnosis not present

## 2018-05-26 DIAGNOSIS — I10 Essential (primary) hypertension: Secondary | ICD-10-CM

## 2018-05-26 DIAGNOSIS — F411 Generalized anxiety disorder: Secondary | ICD-10-CM

## 2018-05-26 DIAGNOSIS — Z0000 Encounter for general adult medical examination without abnormal findings: Secondary | ICD-10-CM | POA: Diagnosis not present

## 2018-05-26 NOTE — Assessment & Plan Note (Signed)
Appears to be recovering well.  Will follow

## 2018-05-26 NOTE — Assessment & Plan Note (Signed)
Doing well on Zoloft, as needed Xanax.  Will continue

## 2018-05-26 NOTE — Patient Instructions (Addendum)
Please call call and schedule your 3D mammogram,  as discussed.   [place]  [place], [place]    Fasting labs - please make an appointment at check out  Due for tetanus at local pharmacy. Called Tdap    Health Maintenance for Postmenopausal Women Menopause is a normal process in which your reproductive ability comes to an end. This process happens gradually over a span of months to years, usually between the ages of 16 and 43. Menopause is complete when you have missed 12 consecutive menstrual periods. It is important to talk with your health care provider about some of the most common conditions that affect postmenopausal women, such as heart disease, cancer, and bone loss (osteoporosis). Adopting a healthy lifestyle and getting preventive care can help to promote your health and wellness. Those actions can also lower your chances of developing some of these common conditions. What should I know about menopause? During menopause, you may experience a number of symptoms, such as:  Moderate-to-severe hot flashes.  Night sweats.  Decrease in sex drive.  Mood swings.  Headaches.  Tiredness.  Irritability.  Memory problems.  Insomnia. Choosing to treat or not to treat menopausal changes is an individual decision that you make with your health care provider. What should I know about hormone replacement therapy and supplements? Hormone therapy products are effective for treating symptoms that are associated with menopause, such as hot flashes and night sweats. Hormone replacement carries certain risks, especially as you become older. If you are thinking about using estrogen or estrogen with progestin treatments, discuss the benefits and risks with your health care provider. What should I know about heart disease and stroke? Heart disease, heart attack, and stroke become more likely as you age. This may be due, in part, to the  hormonal changes that your body experiences during menopause. These can affect how your body processes dietary fats, triglycerides, and cholesterol. Heart attack and stroke are both medical emergencies. There are many things that you can do to help prevent heart disease and stroke:  Have your blood pressure checked at least every 1-2 years. High blood pressure causes heart disease and increases the risk of stroke.  If you are 9-74 years old, ask your health care provider if you should take aspirin to prevent a heart attack or a stroke.  Do not use any tobacco products, including cigarettes, chewing tobacco, or electronic cigarettes. If you need help quitting, ask your health care provider.  It is important to eat a healthy diet and maintain a healthy weight. ? Be sure to include plenty of vegetables, fruits, low-fat dairy products, and lean protein. ? Avoid eating foods that are high in solid fats, added sugars, or salt (sodium).  Get regular exercise. This is one of the most important things that you can do for your health. ? Try to exercise for at least 150 minutes each week. The type of exercise that you do should increase your heart rate and make you sweat. This is known as moderate-intensity exercise. ? Try to do strengthening exercises at least twice each week. Do these in addition to the moderate-intensity exercise.  Know your numbers.Ask your health care provider to check your cholesterol and your blood glucose. Continue to have your blood tested as directed by your health care provider.  What should I know about cancer screening? There are several types of cancer. Take the following steps to reduce your risk and to  catch any cancer development as early as possible. Breast Cancer  Practice breast self-awareness. ? This means understanding how your breasts normally appear and feel. ? It also means doing regular breast self-exams. Let your health care provider know about any changes,  no matter how small.  If you are 94 or older, have a clinician do a breast exam (clinical breast exam or CBE) every year. Depending on your age, family history, and medical history, it may be recommended that you also have a yearly breast X-ray (mammogram).  If you have a family history of breast cancer, talk with your health care provider about genetic screening.  If you are at high risk for breast cancer, talk with your health care provider about having an MRI and a mammogram every year.  Breast cancer (BRCA) gene test is recommended for women who have family members with BRCA-related cancers. Results of the assessment will determine the need for genetic counseling and BRCA1 and for BRCA2 testing. BRCA-related cancers include these types: ? Breast. This occurs in males or females. ? Ovarian. ? Tubal. This may also be called fallopian tube cancer. ? Cancer of the abdominal or pelvic lining (peritoneal cancer). ? Prostate. ? Pancreatic. Cervical, Uterine, and Ovarian Cancer Your health care provider may recommend that you be screened regularly for cancer of the pelvic organs. These include your ovaries, uterus, and vagina. This screening involves a pelvic exam, which includes checking for microscopic changes to the surface of your cervix (Pap test).  For women ages 21-65, health care providers may recommend a pelvic exam and a Pap test every three years. For women ages 25-65, they may recommend the Pap test and pelvic exam, combined with testing for human papilloma virus (HPV), every five years. Some types of HPV increase your risk of cervical cancer. Testing for HPV may also be done on women of any age who have unclear Pap test results.  Other health care providers may not recommend any screening for nonpregnant women who are considered low risk for pelvic cancer and have no symptoms. Ask your health care provider if a screening pelvic exam is right for you.  If you have had past treatment for  cervical cancer or a condition that could lead to cancer, you need Pap tests and screening for cancer for at least 20 years after your treatment. If Pap tests have been discontinued for you, your risk factors (such as having a new sexual partner) need to be reassessed to determine if you should start having screenings again. Some women have medical problems that increase the chance of getting cervical cancer. In these cases, your health care provider may recommend that you have screening and Pap tests more often.  If you have a family history of uterine cancer or ovarian cancer, talk with your health care provider about genetic screening.  If you have vaginal bleeding after reaching menopause, tell your health care provider.  There are currently no reliable tests available to screen for ovarian cancer. Lung Cancer Lung cancer screening is recommended for adults 92-40 years old who are at high risk for lung cancer because of a history of smoking. A yearly low-dose CT scan of the lungs is recommended if you:  Currently smoke.  Have a history of at least 30 pack-years of smoking and you currently smoke or have quit within the past 15 years. A pack-year is smoking an average of one pack of cigarettes per day for one year. Yearly screening should:  Continue until it  has been 15 years since you quit.  Stop if you develop a health problem that would prevent you from having lung cancer treatment. Colorectal Cancer  This type of cancer can be detected and can often be prevented.  Routine colorectal cancer screening usually begins at age 77 and continues through age 74.  If you have risk factors for colon cancer, your health care provider may recommend that you be screened at an earlier age.  If you have a family history of colorectal cancer, talk with your health care provider about genetic screening.  Your health care provider may also recommend using home test kits to check for hidden blood in  your stool.  A small camera at the end of a tube can be used to examine your colon directly (sigmoidoscopy or colonoscopy). This is done to check for the earliest forms of colorectal cancer.  Direct examination of the colon should be repeated every 5-10 years until age 40. However, if early forms of precancerous polyps or small growths are found or if you have a family history or genetic risk for colorectal cancer, you may need to be screened more often. Skin Cancer  Check your skin from head to toe regularly.  Monitor any moles. Be sure to tell your health care provider: ? About any new moles or changes in moles, especially if there is a change in a mole's shape or color. ? If you have a mole that is larger than the size of a pencil eraser.  If any of your family members has a history of skin cancer, especially at a young age, talk with your health care provider about genetic screening.  Always use sunscreen. Apply sunscreen liberally and repeatedly throughout the day.  Whenever you are outside, protect yourself by wearing long sleeves, pants, a wide-brimmed hat, and sunglasses. What should I know about osteoporosis? Osteoporosis is a condition in which bone destruction happens more quickly than new bone creation. After menopause, you may be at an increased risk for osteoporosis. To help prevent osteoporosis or the bone fractures that can happen because of osteoporosis, the following is recommended:  If you are 26-80 years old, get at least 1,000 mg of calcium and at least 600 mg of vitamin D per day.  If you are older than age 40 but younger than age 5, get at least 1,200 mg of calcium and at least 600 mg of vitamin D per day.  If you are older than age 33, get at least 1,200 mg of calcium and at least 800 mg of vitamin D per day. Smoking and excessive alcohol intake increase the risk of osteoporosis. Eat foods that are rich in calcium and vitamin D, and do weight-bearing exercises  several times each week as directed by your health care provider. What should I know about how menopause affects my mental health? Depression may occur at any age, but it is more common as you become older. Common symptoms of depression include:  Low or sad mood.  Changes in sleep patterns.  Changes in appetite or eating patterns.  Feeling an overall lack of motivation or enjoyment of activities that you previously enjoyed.  Frequent crying spells. Talk with your health care provider if you think that you are experiencing depression. What should I know about immunizations? It is important that you get and maintain your immunizations. These include:  Tetanus, diphtheria, and pertussis (Tdap) booster vaccine.  Influenza every year before the flu season begins.  Pneumonia vaccine.  Shingles  vaccine. Your health care provider may also recommend other immunizations. This information is not intended to replace advice given to you by your health care provider. Make sure you discuss any questions you have with your health care provider. Document Released: 04/27/2005 Document Revised: 09/23/2015 Document Reviewed: 12/07/2014 Elsevier Interactive Patient Education  2019 [employer].

## 2018-05-26 NOTE — Progress Notes (Signed)
Subjective:    Patient ID: Samantha [name], female    DOB: 04/11/1940, 78 y.o.   MRN: [id number]  CC: Samantha [name] is a 78 y.o. female who presents today for physical exam.    HPI: Feels well today. No complaints.   HTN- BP running lower now. Not taking any new medications including pain medications. Took 600mg  prior to OV since knew she would be moving around.    Denies exertional chest pain or pressure, numbness or tingling radiating to left arm or jaw, palpitations, dizziness, frequent headaches, changes in vision, or shortness of breath.   HLD-doing well on lipitor  GAD- doing well on zoloft. Xanax half a tablet prn. No si/hi  H/o MI- follows with Harwani. Thinks a h/o atrial fib.   Had lumbar fusion 04/2018 with Dr Trenton Gammon. Pain improved.  Using cane less frequently.    Follows with Dr. Irene Limbo for follicular lymphoma.  Last seen December last year.  No progressive new or progressive adenopathy.  Colorectal Cancer Screening: No longer doing colonoscopy. Declines cologuard. Last colonoscopy per patient 3 years ago, Dr Vira Agar.   Breast Cancer Screening: Mammogram due Cervical Cancer Screening: No hysterectomy.  No vaginal bleeding. Normal pap smears per patient. Unable to see last Pap smear Bone Health screening/DEXA for 65+: Declines.  Lung Cancer Screening: Meets criteria. CT Chest 05/2017 shows no suspicious pulmonary nodules.          Tetanus - due        Pneumococcal -completed  Labs: Screening labs when fasting Exercise: Gets regular exercise.  Alcohol use: Occasionally, socially Smoking/tobacco use: smoker.  Wears seat belt: Yes. Follows with dermatology, no new concerning skin lesions HISTORY:  Past Medical History:  Diagnosis Date  . A-fib ([place])   . Arthritis   . Chronic back pain   . COPD (chronic obstructive pulmonary disease) ([place])   . Coronary artery disease   . Glaucoma   . Hearing aid worn   . High cholesterol   . Hypertension   . Lymphadenopathy  01/25/2016  . Myocardial infarction ([place])    2000  . Non Hodgkin's lymphoma ([place])   . Wears dentures    " Top plate"  . Wears glasses     Past Surgical History:  Procedure Laterality Date  . CARDIAC CATHETERIZATION    . CATARACT EXTRACTION W/ INTRAOCULAR LENS IMPLANT    . CHOLECYSTECTOMY    . COLONOSCOPY W/ BIOPSIES AND POLYPECTOMY    . CORONARY STENT PLACEMENT    . INGUINAL LYMPH NODE BIOPSY Right 01/25/2016   Procedure: EXCISION DEEP RIGHT INGUINAL LYMPH NODE BIOPSY WITH ULTRA SOUND;  Surgeon: Fanny Skates, MD;  Location: [facility];  Service: General;  Laterality: Right;   Family History  Problem Relation Age of Onset  . Hypertension Mother   . Stroke Mother   . Heart disease Father   . Heart disease Other       ALLERGIES: Hydrocodone and Oxycodone  Current Outpatient Medications on File Prior to Visit  Medication Sig Dispense Refill  . ALPRAZolam (XANAX) 0.25 MG tablet Take 0.25 mg by mouth daily as needed for anxiety.     Marland Kitchen amiodarone (PACERONE) 200 MG tablet Take 100 mg by mouth every morning.    Marland Kitchen amLODipine (NORVASC) 5 MG tablet Take 2.5 mg by mouth daily.    Marland Kitchen atorvastatin (LIPITOR) 40 MG tablet Take 40 mg by mouth every morning.     Marland Kitchen ELIQUIS 5 MG TABS tablet Take 5 mg by  mouth 2 (two) times daily.    Marland Kitchen gabapentin (NEURONTIN) 300 MG capsule Take 300 mg by mouth 2 (two) times daily as needed (neuropathy).     . metoprolol succinate (TOPROL-XL) 50 MG 24 hr tablet Take 50 mg by mouth daily.    . nitroGLYCERIN (NITROSTAT) 0.4 MG SL tablet Place 0.4 mg under the tongue every 5 (five) minutes x 3 doses as needed for chest pain.     Marland Kitchen sertraline (ZOLOFT) 50 MG tablet Take 1 tablet (50 mg total) by mouth at bedtime. 90 tablet 3  . timolol (TIMOPTIC) 0.5 % ophthalmic solution Place 1 drop into both eyes 2 (two) times daily. Instill 1 drop in each eye in the morning and one drop in each eye at night    . traMADol (ULTRAM) 50 MG tablet Take 1-2 tablets (50-100 mg total) by mouth  every 6 (six) hours as needed. (Patient not taking: Reported on 05/26/2018) 30 tablet 1   No current facility-administered medications on file prior to visit.     Social History   Tobacco Use  . Smoking status: Current Every Day Smoker    Packs/day: 0.50    Types: Cigarettes  . Smokeless tobacco: Never Used  Substance Use Topics  . Alcohol use: Yes    Comment: social  . Drug use: No    Review of Systems  Constitutional: Negative for chills, fever and unexpected weight change.  HENT: Negative for congestion.   Respiratory: Negative for cough.   Cardiovascular: Negative for chest pain, palpitations and leg swelling.  Gastrointestinal: Negative for nausea and vomiting.  Musculoskeletal: Negative for arthralgias and myalgias.  Skin: Negative for rash.  Neurological: Negative for dizziness, syncope and headaches.  Hematological: Negative for adenopathy.  Psychiatric/Behavioral: Negative for confusion.      Objective:    BP (!) 110/50 (BP Location: Left Arm, Patient Position: Sitting, Cuff Size: Large)   Pulse 67   Temp 97.9 F (36.6 C)   Ht 5\' 9"  (1.753 m)   Wt 215 lb 3.2 oz (97.6 kg)   SpO2 96%   BMI 31.78 kg/m   BP Readings from Last 3 Encounters:  05/26/18 (!) 110/50  04/26/18 102/67  04/18/18 (!) 154/54   Wt Readings from Last 3 Encounters:  05/26/18 215 lb 3.2 oz (97.6 kg)  04/25/18 210 lb (95.3 kg)  04/18/18 210 lb 2 oz (95.3 kg)    Physical Exam Vitals signs reviewed.  Constitutional:      Appearance: She is well-developed.  Eyes:     Conjunctiva/sclera: Conjunctivae normal.  Neck:     Thyroid: No thyroid mass or thyromegaly.  Cardiovascular:     Rate and Rhythm: Normal rate and regular rhythm.     Pulses: Normal pulses.     Heart sounds: Normal heart sounds.  Pulmonary:     Effort: Pulmonary effort is normal.     Breath sounds: Normal breath sounds. No wheezing, rhonchi or rales.  Chest:     Breasts: Breasts are symmetrical.        Right: No  inverted nipple, mass, nipple discharge, skin change or tenderness.        Left: No inverted nipple, mass, nipple discharge, skin change or tenderness.     Comments: Exam performed with patient sitting in chair, she unable to ascend exam table after lumbar fusion Lymphadenopathy:     Head:     Right side of head: No submental, submandibular, tonsillar, preauricular, posterior auricular or occipital adenopathy.  Left side of head: No submental, submandibular, tonsillar, preauricular, posterior auricular or occipital adenopathy.     Cervical: No cervical adenopathy.     Right cervical: No superficial, deep or posterior cervical adenopathy.    Left cervical: No superficial, deep or posterior cervical adenopathy.  Skin:    General: Skin is warm and dry.  Neurological:     Mental Status: She is alert.  Psychiatric:        Speech: Speech normal.        Behavior: Behavior normal.        Thought Content: Thought content normal.        Assessment & Plan:   Problem List Items Addressed This Visit      Cardiovascular and Mediastinum   HTN (hypertension)    Abnormally low for patient however she is asymptomatic. She will keep BP log at home.  Also pending medical records from cardiologist        Musculoskeletal and Integument   Lumbar foraminal stenosis    Appears to be recovering well.  Will follow        Other   GAD (generalized anxiety disorder)    Doing well on Zoloft, as needed Xanax.  Will continue      Routine physical examination - Primary    Clinical breast exam performed.  Patient will schedule mammogram.  She declines pelvic exam in the absence of complaints.  She is no longer doing screening Paps.  She is no longer performing screening colonoscopies.  She declines Cologuard, occult stool cards in the office.  Pending medical records from Dr. Vira Agar with last colonoscopy She declines bone density test.  She also declines participating in the CT lung cancer screening  program as patient has frequent  PET scans, CT chest recently 2019.  I think this is reasonable.      Relevant Orders   Comprehensive metabolic panel   Hemoglobin A1c   Lipid panel   VITAMIN D 25 Hydroxy (Vit-D Deficiency, Fractures)   TSH   Microalbumin / creatinine urine ratio       I have discontinued Van Clines. Leyva's HYDROmorphone. I am also having her maintain her nitroGLYCERIN, ALPRAZolam, gabapentin, amiodarone, metoprolol succinate, atorvastatin, Eliquis, traMADol, timolol, sertraline, and amLODipine.   No orders of the defined types were placed in this encounter.   Return precautions given.   Risks, benefits, and alternatives of the medications and treatment plan prescribed today were discussed, and patient expressed understanding.   Education regarding symptom management and diagnosis given to patient on AVS.   Continue to follow with Burnard Hawthorne, FNP for routine health maintenance.   Velora Mediate and I agreed with plan.   Mable Paris, FNP

## 2018-05-26 NOTE — Assessment & Plan Note (Signed)
Abnormally low for patient however she is asymptomatic. She will keep BP log at home.  Also pending medical records from cardiologist

## 2018-05-26 NOTE — Assessment & Plan Note (Addendum)
Clinical breast exam performed.  Patient will schedule mammogram.  She declines pelvic exam in the absence of complaints.  She is no longer doing screening Paps.  She is no longer performing screening colonoscopies.  She declines Cologuard, occult stool cards in the office.  Pending medical records from Dr. Vira Agar with last colonoscopy She declines bone density test.  She also declines participating in the CT lung cancer screening program as patient has frequent  PET scans, CT chest recently 2019.  I think this is reasonable.

## 2018-05-30 ENCOUNTER — Encounter: Payer: Self-pay | Admitting: Family

## 2018-05-30 DIAGNOSIS — I480 Paroxysmal atrial fibrillation: Secondary | ICD-10-CM | POA: Insufficient documentation

## 2018-06-02 ENCOUNTER — Telehealth: Payer: Self-pay | Admitting: Family

## 2018-06-02 NOTE — Telephone Encounter (Signed)
Call patient  we received her medical records.  Also received a healthcare power of attorney and advanced directives regarding a natural death  dated Mar 08, 2015.  Is this something she wants [place] to put in her chart?    or have there been changes and she would like to bring in a new document?    Please let us know

## 2018-06-03 ENCOUNTER — Other Ambulatory Visit (INDEPENDENT_AMBULATORY_CARE_PROVIDER_SITE_OTHER): Payer: PPO

## 2018-06-03 ENCOUNTER — Other Ambulatory Visit: Payer: Self-pay

## 2018-06-03 DIAGNOSIS — Z0000 Encounter for general adult medical examination without abnormal findings: Secondary | ICD-10-CM | POA: Diagnosis not present

## 2018-06-03 LAB — TSH: TSH: 4.06 u[IU]/mL (ref 0.35–4.50)

## 2018-06-03 LAB — LIPID PANEL
Cholesterol: 118 mg/dL (ref 0–200)
HDL: 37.2 mg/dL — AB (ref >39.00)
LDL Cholesterol: 58 mg/dL (ref 0–99)
NonHDL: 80.87
Total CHOL/HDL Ratio: 3
Triglycerides: 112 mg/dL (ref 0.0–149.0)
VLDL: 22.4 mg/dL (ref 0.0–40.0)

## 2018-06-03 LAB — COMPREHENSIVE METABOLIC PANEL
ALT: 9 U/L (ref 0–35)
AST: 13 U/L (ref 0–37)
Albumin: 3.6 g/dL (ref 3.5–5.2)
Alkaline Phosphatase: 72 U/L (ref 39–117)
BUN: 13 mg/dL (ref 6–23)
CO2: 32 mEq/L (ref 19–32)
Calcium: 9 mg/dL (ref 8.4–10.5)
Chloride: 105 mEq/L (ref 96–112)
Creatinine, Ser: 0.76 mg/dL (ref 0.40–1.20)
GFR: 73.91 mL/min (ref >60.00)
Glucose, Bld: 108 mg/dL — ABNORMAL HIGH (ref 70–99)
POTASSIUM: 4.2 meq/L (ref 3.5–5.1)
SODIUM: 143 meq/L (ref 135–145)
Total Bilirubin: 0.4 mg/dL (ref 0.2–1.2)
Total Protein: 6.5 g/dL (ref 6.0–8.3)

## 2018-06-03 LAB — HEMOGLOBIN A1C: Hgb A1c MFr Bld: 6 % (ref 4.6–6.5)

## 2018-06-03 LAB — VITAMIN D 25 HYDROXY (VIT D DEFICIENCY, FRACTURES): VITD: 12.4 ng/mL — ABNORMAL LOW (ref 30.00–100.00)

## 2018-06-03 LAB — MICROALBUMIN / CREATININE URINE RATIO
Creatinine,U: 136.8 mg/dL
Microalb Creat Ratio: 1 mg/g (ref 0.0–30.0)
Microalb, Ur: 1.3 mg/dL (ref 0.0–1.9)

## 2018-06-04 NOTE — Telephone Encounter (Signed)
Call patient Received colonoscopy report from 2012 with Dr. Vira Agar.  It appears she was told to come back in 10 years That would mean in 2022 she is due.  Please advise patient to ensure she is followed Dr. Vira Agar at this time next year.  She may asked for a referral from our office next year.

## 2018-06-04 NOTE — Telephone Encounter (Signed)
I have spoken with Samantha [name] & she stated that the current copy of HCPOA is enough. No there documentation needed. I have given to her to scan in.

## 2018-06-04 NOTE — Telephone Encounter (Signed)
noted 

## 2018-06-04 NOTE — Telephone Encounter (Signed)
Call patient Please make sure you print patient statement in regards to her advanced directives and healthcare power of attorney. I know you spoke with her however I cannot see it in her chart now.     We can scan this with HCPOA  document and put this in her chart

## 2018-06-04 NOTE — Telephone Encounter (Signed)
I called patient & she stated that she will not be having another colonoscopy. She does not feel the need.

## 2018-06-30 IMAGING — CR DG TIBIA/FIBULA 2V*R*
4 series · 4 of 4 positions shown · non-contrast
Comparison: None in PACs

CLINICAL DATA: Patient fell this morning after tripping over dog
striking the left right lower leg.

EXAM:
RIGHT TIBIA AND FIBULA - 2 VIEW

[x tib-fib ap right]
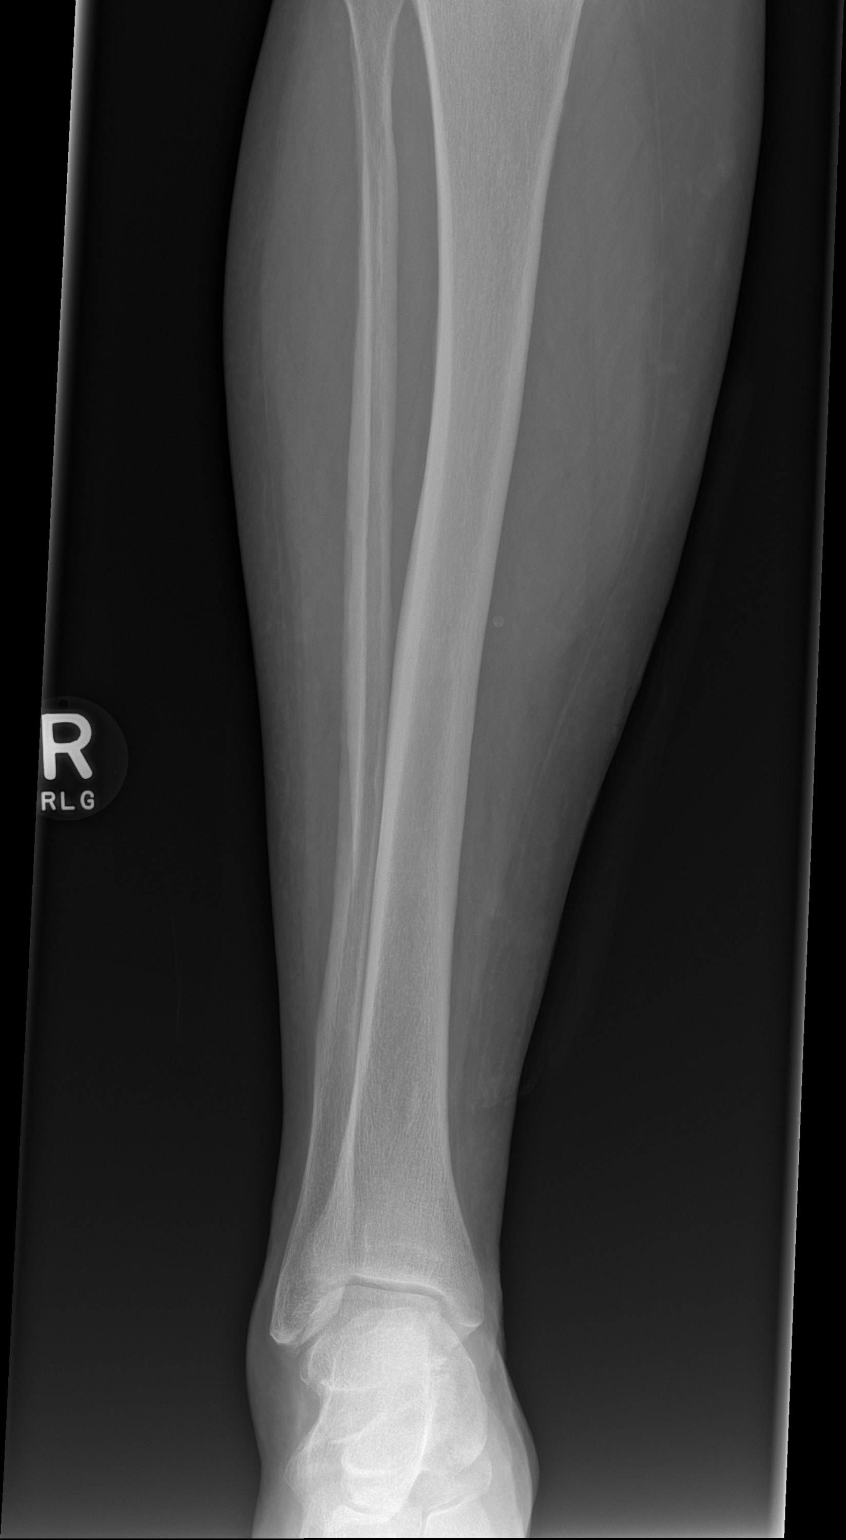

[x tib-fib lat right (1 of 3)]
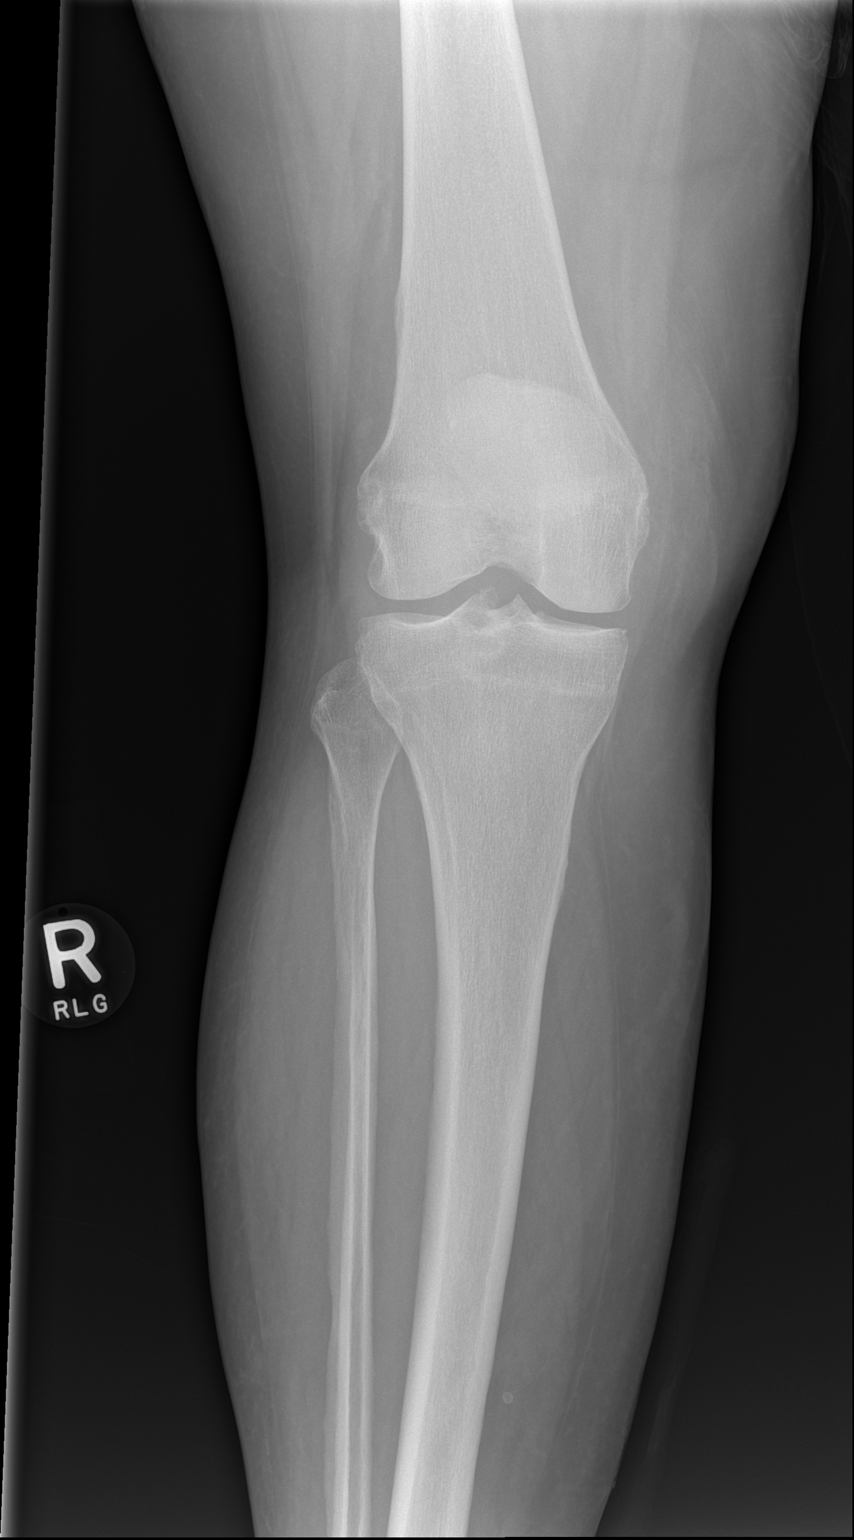

[x tib-fib lat right (2 of 3)]
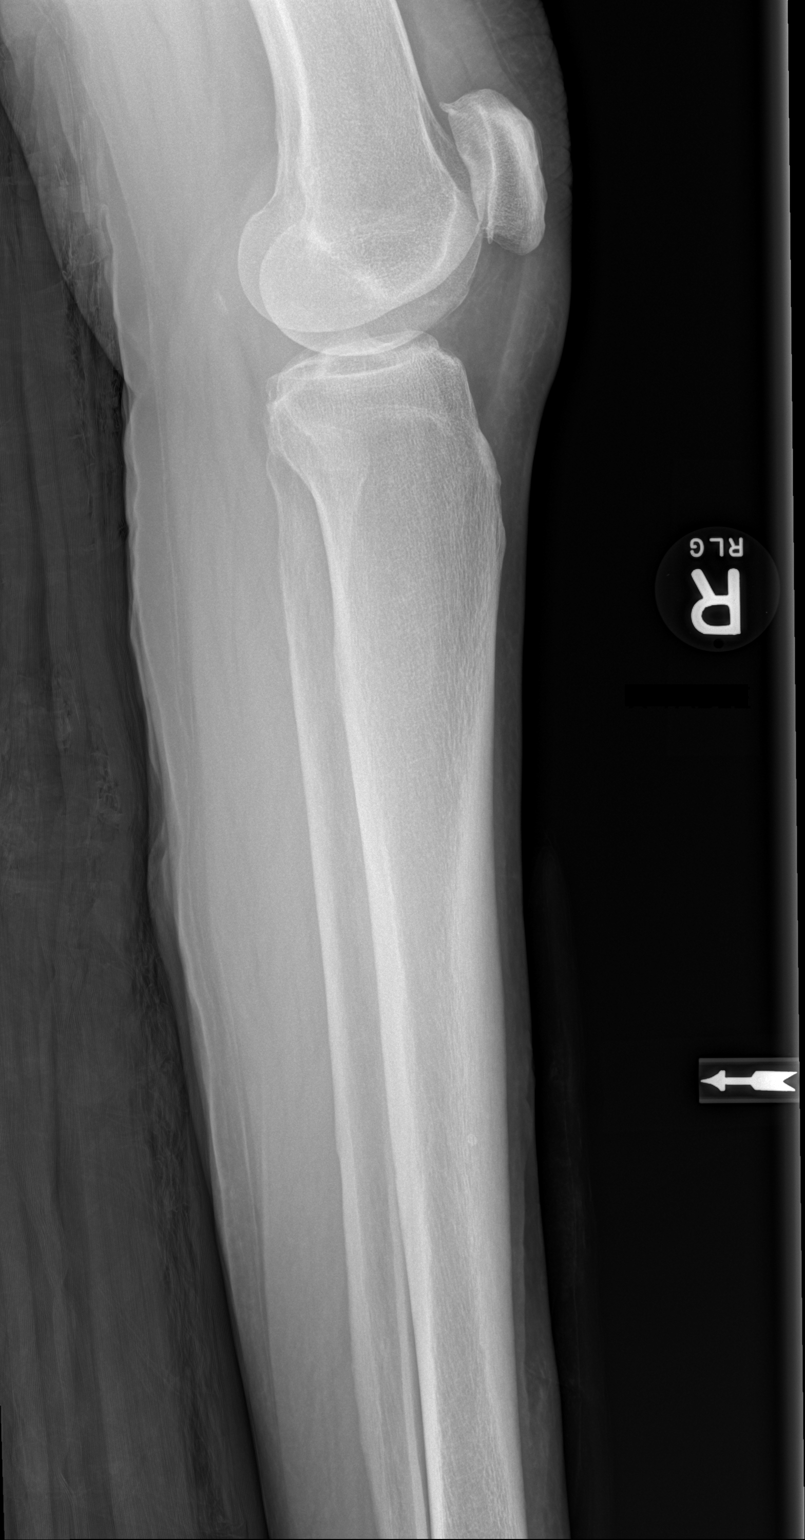

[x tib-fib lat right (3 of 3)]
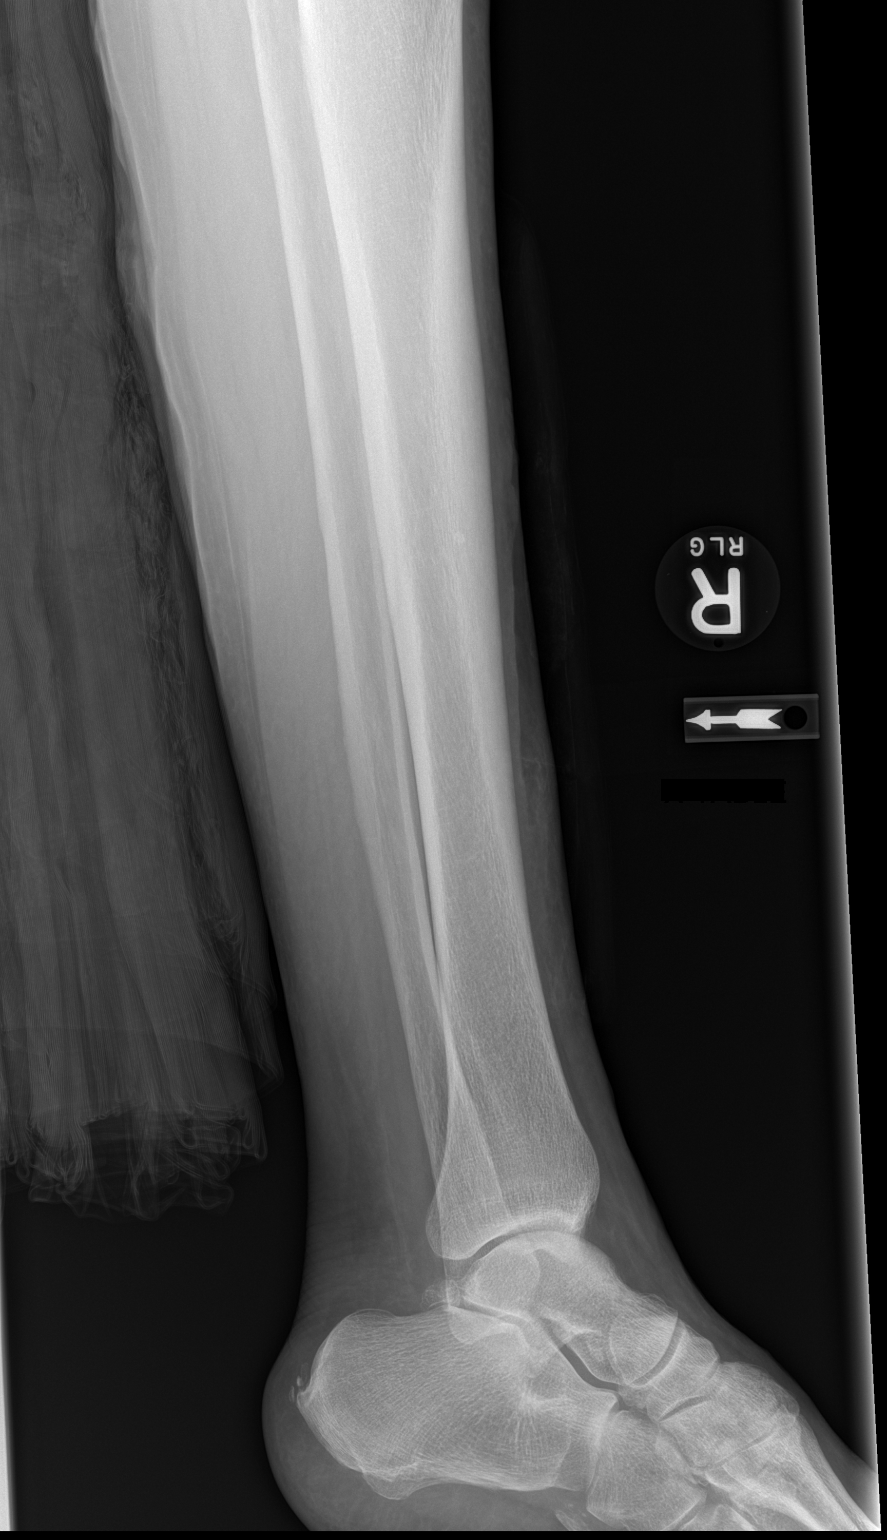

[4 of 4 positions shown; findings below may reference images not displayed]

FINDINGS: The right tibia and fibula are subjectively adequately mineralized.
There is no acute fracture nor dislocation. There is soft tissue
swelling and heterogeneous density over the lower pretibial region.
No radiopaque foreign bodies are observed. The observed portions of
the knee and ankle exhibit no acute abnormalities.
IMPRESSION: No acute fracture nor dislocation of the right tibia or fibula. Soft
tissue injury over the lower pretibial region.

## 2018-07-23 DIAGNOSIS — M48061 Spinal stenosis, lumbar region without neurogenic claudication: Secondary | ICD-10-CM | POA: Diagnosis not present

## 2018-07-25 ENCOUNTER — Ambulatory Visit (INDEPENDENT_AMBULATORY_CARE_PROVIDER_SITE_OTHER): Payer: PPO | Admitting: Family

## 2018-07-25 ENCOUNTER — Encounter: Payer: Self-pay | Admitting: Family

## 2018-07-25 DIAGNOSIS — R490 Dysphonia: Secondary | ICD-10-CM | POA: Diagnosis not present

## 2018-07-25 DIAGNOSIS — F411 Generalized anxiety disorder: Secondary | ICD-10-CM | POA: Diagnosis not present

## 2018-07-25 MED ORDER — FLUTICASONE PROPIONATE 50 MCG/ACT NA SUSP: 2.0000 | Freq: Every day | NASAL | 3 refills | Status: DC

## 2018-07-25 NOTE — Progress Notes (Signed)
Printed and mailed

## 2018-07-25 NOTE — Patient Instructions (Addendum)
Suspect allergies or over use.   Voice rest  Trial of flonase.   Call us next week and let me know how you are doing.  Please call and make follow up appointment in the next 3 months; Continue to follow with Dr Terrence Dupont.   Stay safe!

## 2018-07-25 NOTE — Assessment & Plan Note (Signed)
Doing well on regimen. continue

## 2018-07-25 NOTE — Progress Notes (Signed)
This visit type was conducted due to national recommendations for restrictions regarding the COVID-19 pandemic (e.g. social distancing).  This format is felt to be most appropriate for this patient at this time.  All issues noted in this document were discussed and addressed.  No physical exam was performed (except for noted visual exam findings with Video Visits). Virtual Visit via Video Note  I connected with@  on 07/25/18 at 11:00 AM EDT by a video enabled telemedicine application and verified that I am speaking with the correct person using two identifiers.  Location patient: home Location provider:work  Persons participating in the virtual visit: patient, provider  I discussed the limitations of evaluation and management by telemedicine and the availability of in person appointments. The patient expressed understanding and agreed to proceed.  Interactive audio and video telecommunications were attempted between this provider and patient, however failed, due to patient having technical difficulties or patient did not have access to video capability.  We continued and completed visit with audio only.   HPI: CC: hoarseness x 2 days, improves throughout day.  Woke up one morning with hoarseness. Talking on the phone a bit.  No sneezing. Random dry cough with occasional clear -white sputum. Feels like something dripping down throat. Perhaps allergies she thinks. No sob, CP, orthopnea, leg swelling, sore throat, trouble swallowing, epigastric burning, regurgitation.   Hasnt tried any medication for symptom.   Otherwise feels well. Staying home.   Smoker; has used inhaler in the past.  Follows with Dr Terrence Dupont, cardiology. See him next month.   Depression- feels well on zoloft ; uses xanax 1/2 tablet 3x per week.   No falls.   Hasnt used tramadol since summer 2019.   ROS: See pertinent positives and negatives per HPI.  Past Medical History:  Diagnosis Date  . A-fib ([place])   .  Arthritis   . Chronic back pain   . COPD (chronic obstructive pulmonary disease) ([place])   . Coronary artery disease   . Glaucoma   . Hearing aid worn   . High cholesterol   . Hypertension   . Lymphadenopathy 01/25/2016  . Myocardial infarction ([place])    2000  . Non Hodgkin's lymphoma ([place])   . Wears dentures    " Top plate"  . Wears glasses     Past Surgical History:  Procedure Laterality Date  . CARDIAC CATHETERIZATION    . CATARACT EXTRACTION W/ INTRAOCULAR LENS IMPLANT    . CHOLECYSTECTOMY    . COLONOSCOPY W/ BIOPSIES AND POLYPECTOMY    . CORONARY STENT PLACEMENT    . INGUINAL LYMPH NODE BIOPSY Right 01/25/2016   Procedure: EXCISION DEEP RIGHT INGUINAL LYMPH NODE BIOPSY WITH ULTRA SOUND;  Surgeon: Fanny Skates, MD;  Location: [facility];  Service: General;  Laterality: Right;    Family History  Problem Relation Age of Onset  . Hypertension Mother   . Stroke Mother   . Heart disease Father   . Heart disease Other     SOCIAL HX: smoker   Current Outpatient Medications:  .  ALPRAZolam (XANAX) 0.25 MG tablet, Take 0.25 mg by mouth daily as needed for anxiety. , Disp: , Rfl:  .  amiodarone (PACERONE) 200 MG tablet, Take 100 mg by mouth every morning., Disp: , Rfl:  .  amLODipine (NORVASC) 5 MG tablet, Take 2.5 mg by mouth daily., Disp: , Rfl:  .  atorvastatin (LIPITOR) 40 MG tablet, Take 40 mg by mouth every morning. , Disp: , Rfl:  .  ELIQUIS 5 MG TABS tablet, Take 5 mg by mouth 2 (two) times daily., Disp: , Rfl:  .  gabapentin (NEURONTIN) 300 MG capsule, Take 300 mg by mouth 2 (two) times daily as needed (neuropathy). , Disp: , Rfl:  .  metoprolol succinate (TOPROL-XL) 50 MG 24 hr tablet, Take 50 mg by mouth daily., Disp: , Rfl:  .  nitroGLYCERIN (NITROSTAT) 0.4 MG SL tablet, Place 0.4 mg under the tongue every 5 (five) minutes x 3 doses as needed for chest pain. , Disp: , Rfl:  .  sertraline (ZOLOFT) 50 MG tablet, Take 1 tablet (50 mg total) by mouth at bedtime., Disp: 90  tablet, Rfl: 3 .  timolol (TIMOPTIC) 0.5 % ophthalmic solution, Place 1 drop into both eyes 2 (two) times daily. Instill 1 drop in each eye in the morning and one drop in each eye at night, Disp: , Rfl:  .  traMADol (ULTRAM) 50 MG tablet, Take 1-2 tablets (50-100 mg total) by mouth every 6 (six) hours as needed., Disp: 30 tablet, Rfl: 1 .  fluticasone (FLONASE) 50 MCG/ACT nasal spray, Place 2 sprays into both nostrils daily., Disp: 16 g, Rfl: 3    ASSESSMENT AND PLAN:  Discussed the following assessment and plan:  Hoarseness - Plan: fluticasone (FLONASE) 50 MCG/ACT nasal spray  GAD (generalized anxiety disorder) Problem List Items Addressed This Visit      Other   GAD (generalized anxiety disorder)    Doing well on regimen. continue      Hoarseness - Primary    No acute respiratory stress over phone call this morning.  Suspect allergies, overuse talking playing a role.  Trial of Flonase.We also consider adjunct with antihistamine.  Patient will  let me know how she is doing.        Relevant Medications   fluticasone (FLONASE) 50 MCG/ACT nasal spray         I discussed the assessment and treatment plan with the patient. The patient was provided an opportunity to ask questions and all were answered. The patient agreed with the plan and demonstrated an understanding of the instructions.   The patient was advised to call back or seek an in-person evaluation if the symptoms worsen or if the condition fails to improve as anticipated.   Mable Paris, FNP   I spent 15 min non face to face w/ pt.

## 2018-07-25 NOTE — Assessment & Plan Note (Signed)
No acute respiratory stress over phone call this morning.  Suspect allergies, overuse talking playing a role.  Trial of Flonase.We also consider adjunct with antihistamine.  Patient will  let me know how she is doing.

## 2018-07-30 DIAGNOSIS — M545 Low back pain: Secondary | ICD-10-CM | POA: Diagnosis not present

## 2018-07-30 DIAGNOSIS — M48061 Spinal stenosis, lumbar region without neurogenic claudication: Secondary | ICD-10-CM | POA: Diagnosis not present

## 2018-07-30 DIAGNOSIS — R2681 Unsteadiness on feet: Secondary | ICD-10-CM | POA: Diagnosis not present

## 2018-08-05 DIAGNOSIS — M545 Low back pain: Secondary | ICD-10-CM | POA: Diagnosis not present

## 2018-08-05 DIAGNOSIS — R2681 Unsteadiness on feet: Secondary | ICD-10-CM | POA: Diagnosis not present

## 2018-08-05 DIAGNOSIS — M48061 Spinal stenosis, lumbar region without neurogenic claudication: Secondary | ICD-10-CM | POA: Diagnosis not present

## 2018-08-07 DIAGNOSIS — M48061 Spinal stenosis, lumbar region without neurogenic claudication: Secondary | ICD-10-CM | POA: Diagnosis not present

## 2018-08-07 DIAGNOSIS — R2681 Unsteadiness on feet: Secondary | ICD-10-CM | POA: Diagnosis not present

## 2018-08-07 DIAGNOSIS — M545 Low back pain: Secondary | ICD-10-CM | POA: Diagnosis not present

## 2018-08-12 DIAGNOSIS — M545 Low back pain: Secondary | ICD-10-CM | POA: Diagnosis not present

## 2018-08-12 DIAGNOSIS — M48061 Spinal stenosis, lumbar region without neurogenic claudication: Secondary | ICD-10-CM | POA: Diagnosis not present

## 2018-08-12 DIAGNOSIS — R2681 Unsteadiness on feet: Secondary | ICD-10-CM | POA: Diagnosis not present

## 2018-08-13 DIAGNOSIS — F17290 Nicotine dependence, other tobacco product, uncomplicated: Secondary | ICD-10-CM | POA: Diagnosis not present

## 2018-08-13 DIAGNOSIS — I480 Paroxysmal atrial fibrillation: Secondary | ICD-10-CM | POA: Diagnosis not present

## 2018-08-13 DIAGNOSIS — I252 Old myocardial infarction: Secondary | ICD-10-CM | POA: Diagnosis not present

## 2018-08-13 DIAGNOSIS — I2510 Atherosclerotic heart disease of native coronary artery without angina pectoris: Secondary | ICD-10-CM | POA: Diagnosis not present

## 2018-08-13 DIAGNOSIS — E119 Type 2 diabetes mellitus without complications: Secondary | ICD-10-CM | POA: Diagnosis not present

## 2018-08-13 DIAGNOSIS — I10 Essential (primary) hypertension: Secondary | ICD-10-CM | POA: Diagnosis not present

## 2018-08-13 DIAGNOSIS — M1990 Unspecified osteoarthritis, unspecified site: Secondary | ICD-10-CM | POA: Diagnosis not present

## 2018-08-13 DIAGNOSIS — E785 Hyperlipidemia, unspecified: Secondary | ICD-10-CM | POA: Diagnosis not present

## 2018-08-14 DIAGNOSIS — M545 Low back pain: Secondary | ICD-10-CM | POA: Diagnosis not present

## 2018-08-14 DIAGNOSIS — M48061 Spinal stenosis, lumbar region without neurogenic claudication: Secondary | ICD-10-CM | POA: Diagnosis not present

## 2018-08-14 DIAGNOSIS — R2681 Unsteadiness on feet: Secondary | ICD-10-CM | POA: Diagnosis not present

## 2018-08-19 DIAGNOSIS — R2681 Unsteadiness on feet: Secondary | ICD-10-CM | POA: Diagnosis not present

## 2018-08-19 DIAGNOSIS — M545 Low back pain: Secondary | ICD-10-CM | POA: Diagnosis not present

## 2018-08-19 DIAGNOSIS — M48061 Spinal stenosis, lumbar region without neurogenic claudication: Secondary | ICD-10-CM | POA: Diagnosis not present

## 2018-08-21 DIAGNOSIS — M48061 Spinal stenosis, lumbar region without neurogenic claudication: Secondary | ICD-10-CM | POA: Diagnosis not present

## 2018-08-21 DIAGNOSIS — R2681 Unsteadiness on feet: Secondary | ICD-10-CM | POA: Diagnosis not present

## 2018-08-21 DIAGNOSIS — M545 Low back pain: Secondary | ICD-10-CM | POA: Diagnosis not present

## 2018-08-26 DIAGNOSIS — R2681 Unsteadiness on feet: Secondary | ICD-10-CM | POA: Diagnosis not present

## 2018-08-26 DIAGNOSIS — M545 Low back pain: Secondary | ICD-10-CM | POA: Diagnosis not present

## 2018-08-26 DIAGNOSIS — M48061 Spinal stenosis, lumbar region without neurogenic claudication: Secondary | ICD-10-CM | POA: Diagnosis not present

## 2018-08-28 DIAGNOSIS — M545 Low back pain: Secondary | ICD-10-CM | POA: Diagnosis not present

## 2018-08-28 DIAGNOSIS — R2681 Unsteadiness on feet: Secondary | ICD-10-CM | POA: Diagnosis not present

## 2018-08-28 DIAGNOSIS — M48061 Spinal stenosis, lumbar region without neurogenic claudication: Secondary | ICD-10-CM | POA: Diagnosis not present

## 2018-08-29 NOTE — Progress Notes (Signed)
HEMATOLOGY/ONCOLOGY CLINIC NOTE  Date of Service: 09/02/17   Patient Care Team: Burnard Hawthorne, FNP as PCP - General (Family Medicine) Physical medicine and rehabilitation: Sharlet Salina DO  CHIEF COMPLAINTS/PURPOSE OF CONSULTATION:  F/u for follicular lymphoma  INTERVAL HISTORY  Samantha [name] is here for management and evaluation of her follicular lymphoma. The patient's last visit with [place] was on 03/03/18. The pt reports that she is doing well overall.  The pt reports that she had back surgery in February and notes that this has been helpful. She notes that she has had laryngitis for the past 5 weeks, and has followed up with her PCP regarding this. She denies acid reflux. She notes that her throat has not been sore, she denies fevers, chills, or new cough. She notes that her voice is improving slowly. The pt notes that she burps frequently after eating.   The pt notes that her left hip has been bothersome, but she continues with PT twice a week. Otherwise, she notes that she is avoiding public spaces.  Lab results today (09/01/18) of CBC w/diff and CMP is as follows: all values are WNL except for Calcium at 8.7. 09/01/18 LDH is WNL at 191  On review of systems, pt reports altered voice, frequent burping, stable energy levels, moving her bowels well, and denies acid reflux, sore throat, fevers, chills, new cough, unexpected weight loss, abdominal pains, leg swelling, and any other symptoms.    MEDICAL HISTORY:  Past Medical History:  Diagnosis Date  . A-fib ([place])   . Arthritis   . Chronic back pain   . COPD (chronic obstructive pulmonary disease) ([place])   . Coronary artery disease   . Glaucoma   . Hearing aid worn   . High cholesterol   . Hypertension   . Lymphadenopathy 01/25/2016  . Myocardial infarction ([place])    2000  . Non Hodgkin's lymphoma ([place])   . Wears dentures    " Top plate"  . Wears glasses   #1 bilateral cataracts #2 COPD  #3 active smoker half a  pack per day for about 60 years #4 degenerative arthritis - has had chronic back pain issues with multiple epidural steroid injections from 2014 [5 times] #5 glaucoma #6 degenerative arthritis #7 history of TIA #8 dyslipidemia #9 hypertension #10 coronary artery disease with inferior wall MI in the past, status post PCI to RCA in 2001 #11 hypothyroidism #12 lumbar radiculopathy/neuropathy. #13 paroxysmal atrial fibrillation on anticoagulation with Eliquis. #14 type 2 diabetes #16 obesity  SURGICAL HISTORY:  #1 cardiac catheterization #2 cholecystectomy #3 colonoscopy #4 wisdom teeth extraction.  SOCIAL HISTORY: Currently single. Notes that she lost her female partner 9 months ago. Active smoker half pack per day since her teens  FAMILY HISTORY: Patient reports no history of cancers or blood disorders in the family that she is aware of.  ALLERGIES:  is allergic to hydrocodone and oxycodone.  MEDICATIONS:  Current Outpatient Medications  Medication Sig Dispense Refill  . ALPRAZolam (XANAX) 0.25 MG tablet Take 0.25 mg by mouth daily as needed for anxiety.     Marland Kitchen amiodarone (PACERONE) 200 MG tablet Take 100 mg by mouth every morning.    Marland Kitchen amLODipine (NORVASC) 5 MG tablet Take 2.5 mg by mouth daily.    Marland Kitchen atorvastatin (LIPITOR) 40 MG tablet Take 40 mg by mouth every morning.     Marland Kitchen ELIQUIS 5 MG TABS tablet Take 5 mg by mouth 2 (two) times daily.    Marland Kitchen  fluticasone (FLONASE) 50 MCG/ACT nasal spray Place 2 sprays into both nostrils daily. 16 g 3  . gabapentin (NEURONTIN) 300 MG capsule Take 300 mg by mouth 2 (two) times daily as needed (neuropathy).     . metoprolol succinate (TOPROL-XL) 50 MG 24 hr tablet Take 50 mg by mouth daily.    . nitroGLYCERIN (NITROSTAT) 0.4 MG SL tablet Place 0.4 mg under the tongue every 5 (five) minutes x 3 doses as needed for chest pain.     Marland Kitchen sertraline (ZOLOFT) 50 MG tablet Take 1 tablet (50 mg total) by mouth at bedtime. 90 tablet 3  . timolol  (TIMOPTIC) 0.5 % ophthalmic solution Place 1 drop into both eyes 2 (two) times daily. Instill 1 drop in each eye in the morning and one drop in each eye at night    . traMADol (ULTRAM) 50 MG tablet Take 1-2 tablets (50-100 mg total) by mouth every 6 (six) hours as needed. 30 tablet 1   No current facility-administered medications for this visit.     REVIEW OF SYSTEMS:    A 10+ POINT REVIEW OF SYSTEMS WAS OBTAINED including neurology, dermatology, psychiatry, cardiac, respiratory, lymph, extremities, GI, GU, Musculoskeletal, constitutional, breasts, reproductive, HEENT.  All pertinent positives are noted in the HPI.  All others are negative.   PHYSICAL EXAMINATION:  ECOG PERFORMANCE STATUS: 1 - Symptomatic but completely ambulatory Vital signs recorded in the EMR, reviewed.  GENERAL:alert, in no acute distress and comfortable SKIN: no acute rashes, no significant lesions EYES: conjunctiva are pink and non-injected, sclera anicteric OROPHARYNX: MMM, no exudates, no oropharyngeal erythema or ulceration NECK: supple, no JVD LYMPH:  no palpable lymphadenopathy in the cervical, axillary or inguinal regions LUNGS: clear to auscultation b/l with normal respiratory effort HEART: regular rate & rhythm ABDOMEN:  normoactive bowel sounds , non tender, not distended. No palpable hepatosplenomegaly.  Extremity: no pedal edema PSYCH: alert & oriented x 3 with fluent speech NEURO: no focal motor/sensory deficits   LABORATORY DATA:  I have reviewed the data as listed  . CBC Latest Ref Rng & Units 09/01/2018 04/18/2018 03/03/2018  WBC 4.0 - 10.5 K/uL 8.1 9.4 9.9  Hemoglobin 12.0 - 15.0 g/dL 13.0 13.0 12.9  Hematocrit 36.0 - 46.0 % 41.3 42.0 41.0  Platelets 150 - 400 K/uL 205 189 162   . CBC    Component Value Date/Time   WBC 8.1 09/01/2018 1132   RBC 4.38 09/01/2018 1132   HGB 13.0 09/01/2018 1132   HGB 13.9 02/18/2018 1116   HGB 12.7 03/04/2017 1349   HCT 41.3 09/01/2018 1132   HCT 39.8  03/04/2017 1349   PLT 205 09/01/2018 1132   PLT 196 02/18/2018 1116   PLT 180 03/04/2017 1349   MCV 94.3 09/01/2018 1132   MCV 99.0 03/04/2017 1349   MCH 29.7 09/01/2018 1132   MCHC 31.5 09/01/2018 1132   RDW 13.6 09/01/2018 1132   RDW 12.9 03/04/2017 1349   LYMPHSABS 2.1 09/01/2018 1132   LYMPHSABS 1.9 03/04/2017 1349   MONOABS 0.7 09/01/2018 1132   MONOABS 0.6 03/04/2017 1349   EOSABS 0.3 09/01/2018 1132   EOSABS 0.2 03/04/2017 1349   BASOSABS 0.1 09/01/2018 1132   BASOSABS 0.0 03/04/2017 1349    . CMP Latest Ref Rng & Units 09/01/2018 06/03/2018 04/18/2018  Glucose 70 - 99 mg/dL 88 108(H) 137(H)  BUN 8 - 23 mg/dL 9 13 12   Creatinine 0.44 - 1.00 mg/dL 0.83 0.76 0.91  Sodium 135 - 145 mmol/L 142 143  142  Potassium 3.5 - 5.1 mmol/L 4.5 4.2 4.9  Chloride 98 - 111 mmol/L 106 105 107  CO2 22 - 32 mmol/L 27 32 28  Calcium 8.9 - 10.3 mg/dL 8.7(L) 9.0 8.9  Total Protein 6.5 - 8.1 g/dL 6.6 6.5 -  Total Bilirubin 0.3 - 1.2 mg/dL 0.4 0.4 -  Alkaline Phos 38 - 126 U/L 73 72 -  AST 15 - 41 U/L 19 13 -  ALT 0 - 44 U/L 18 9 -   . Lab Results  Component Value Date   LDH 191 09/01/2018    .          RADIOGRAPHIC STUDIES: I have personally reviewed the radiological images as listed and agreed with the findings in the report. No results found.  PET/CT 12/22/2015  IMPRESSION: 1. Hypermetabolic confluent lower retroperitoneal / bilateral common iliac, right external iliac and right inguinal lymphadenopathy, consistent with lymphoma. 2. Solitary focus of osseous hypermetabolism in the left upper sacrum consistent with osseous involvement by lymphoma. 3. No metabolically active lymphoma in the neck or chest. Spleen is normal in size and metabolism. 4. Additional findings include aortic atherosclerosis, three-vessel coronary atherosclerosis and mild sigmoid diverticulosis.   Electronically Signed   By: Ilona Sorrel M.D.   On: 12/22/2015 17:21   ASSESSMENT & PLAN:    78 y.o. Caucasian female with multiple chronic medical comorbidities as noted above with   #1 Stage IVB Low grade Follicular Lymphoma with extensive retroperitoneal lymphadenopathy and right inguinal lymphadenopathy as well as osseous involvement of the sacrum. LDH level within normal limits.  S/p 5 cycles of BR (Bendamustine 70mg /m2)  PET CT scan after 5 cycles of Bendamustine and Rituxan done on 09/06/2016 shows no residual metabolic active disease and no evidence of disease progression.  CT C/A/P on 05/20/17 reveals no radiographic evidence of disease progression.   02/19/18 CT C/A/P revealed Stable mild retroperitoneal adenopathy, compatible with treated lymphoma. No new or progressive adenopathy. No new sites of disease. Normal size spleen. 2. Three-vessel coronary atherosclerosis. 3. Aortic Atherosclerosis.  #2 Rt inguinal LN biopsy site infection and seroma -now resolved  #3 New persistent hoarseness/change in voice  PLAN:  -Discussed pt labwork today, 09/01/18; blood counts remain normal, chemistries are stable. LDH is WNL at 191 -The pt shows no clinical or lab progression of her low grade Follicular lymphoma at this time.  -No indication for further treatment at this time. -Use PPI for 1 months for possible silent reflux -Recommend salt and baking soda mouthwashes 4-5 times per day -Will refer pt to see ENT for detailed evaluation given persistent hoarseness and voice changes, especially considering her smoking history. -Will repeat CT imaging in 12 months from 03/01/18 scans -Again counseled the pt towards smoking cessation and the pt notes that she continues to be very sincere in her desire to continue smoking  -previously discussed pro's vs con's of maintenance Rituxan - patient has continued to decline this option. -infection prevention strategies -Recommend warm compresses and continuing follow up with Dr Terrence Dupont -Will see the pt back in 4 months  -ENT referral for new  persistent hoarseness and voice change -RTC with Dr Irene Limbo with labs in 4 months   All of the patients , her friends questions were answered to their apparent satisfaction. The patient knows to call the clinic with any problems, questions or concerns.  The total time spent in the appt was 25 minutes and more than 50% was on counseling and direct patient cares.  Sullivan Lone MD [place] AAHIVMS [facility] Hematology/Oncology Physician [facility]  (Office):       [phone] (Work cell):  [phone] (Fax):           [phone]  I, Baldwin [place], am acting as a scribe for Dr. Sullivan Lone.   .I have reviewed the above documentation for accuracy and completeness, and I agree with the above. Brunetta Genera MD

## 2018-09-01 ENCOUNTER — Inpatient Hospital Stay: Payer: PPO

## 2018-09-01 ENCOUNTER — Other Ambulatory Visit: Payer: Self-pay

## 2018-09-01 ENCOUNTER — Inpatient Hospital Stay: Payer: PPO | Attending: Hematology | Admitting: Hematology

## 2018-09-01 ENCOUNTER — Telehealth: Payer: Self-pay | Admitting: Hematology

## 2018-09-01 VITALS — BP 128/56 | HR 63 | Temp 98.0°F | Resp 18 | Ht 69.0 in | Wt 213.5 lb

## 2018-09-01 DIAGNOSIS — E119 Type 2 diabetes mellitus without complications: Secondary | ICD-10-CM

## 2018-09-01 DIAGNOSIS — C8298 Follicular lymphoma, unspecified, lymph nodes of multiple sites: Secondary | ICD-10-CM

## 2018-09-01 DIAGNOSIS — J449 Chronic obstructive pulmonary disease, unspecified: Secondary | ICD-10-CM | POA: Diagnosis not present

## 2018-09-01 DIAGNOSIS — I10 Essential (primary) hypertension: Secondary | ICD-10-CM | POA: Diagnosis not present

## 2018-09-01 DIAGNOSIS — R490 Dysphonia: Secondary | ICD-10-CM

## 2018-09-01 DIAGNOSIS — E669 Obesity, unspecified: Secondary | ICD-10-CM

## 2018-09-01 DIAGNOSIS — F17210 Nicotine dependence, cigarettes, uncomplicated: Secondary | ICD-10-CM

## 2018-09-01 LAB — CMP (CANCER CENTER ONLY)
ALT: 18 U/L (ref 0–44)
AST: 19 U/L (ref 15–41)
Albumin: 3.6 g/dL (ref 3.5–5.0)
Alkaline Phosphatase: 73 U/L (ref 38–126)
Anion gap: 9 (ref 5–15)
BUN: 9 mg/dL (ref 8–23)
CO2: 27 mmol/L (ref 22–32)
Calcium: 8.7 mg/dL — ABNORMAL LOW (ref 8.9–10.3)
Chloride: 106 mmol/L (ref 98–111)
Creatinine: 0.83 mg/dL (ref 0.44–1.00)
GFR, Est AFR Am: >60 mL/min (ref >60)
GFR, Estimated: >60 mL/min (ref >60)
Glucose, Bld: 88 mg/dL (ref 70–99)
Potassium: 4.5 mmol/L (ref 3.5–5.1)
Sodium: 142 mmol/L (ref 135–145)
Total Bilirubin: 0.4 mg/dL (ref 0.3–1.2)
Total Protein: 6.6 g/dL (ref 6.5–8.1)

## 2018-09-01 LAB — LACTATE DEHYDROGENASE: LDH: 191 U/L (ref 98–192)

## 2018-09-01 LAB — CBC WITH DIFFERENTIAL/PLATELET
Abs Immature Granulocytes: 0.01 10*3/uL (ref 0.00–0.07)
Basophils Absolute: 0.1 10*3/uL (ref 0.0–0.1)
Basophils Relative: 1 %
Eosinophils Absolute: 0.3 10*3/uL (ref 0.0–0.5)
Eosinophils Relative: 4 %
HCT: 41.3 % (ref 36.0–46.0)
Hemoglobin: 13 g/dL (ref 12.0–15.0)
Immature Granulocytes: 0 %
Lymphocytes Relative: 26 %
Lymphs Abs: 2.1 10*3/uL (ref 0.7–4.0)
MCH: 29.7 pg (ref 26.0–34.0)
MCHC: 31.5 g/dL (ref 30.0–36.0)
MCV: 94.3 fL (ref 80.0–100.0)
Monocytes Absolute: 0.7 10*3/uL (ref 0.1–1.0)
Monocytes Relative: 9 %
Neutro Abs: 5 10*3/uL (ref 1.7–7.7)
Neutrophils Relative %: 60 %
Platelets: 205 10*3/uL (ref 150–400)
RBC: 4.38 MIL/uL (ref 3.87–5.11)
RDW: 13.6 % (ref 11.5–15.5)
WBC: 8.1 10*3/uL (ref 4.0–10.5)
nRBC: 0 % (ref 0.0–0.2)

## 2018-09-01 MED ORDER — PANTOPRAZOLE SODIUM 40 MG PO TBEC: 40.0000 mg | DELAYED_RELEASE_TABLET | Freq: Every day | ORAL | 0 refills | Status: DC

## 2018-09-01 NOTE — Telephone Encounter (Signed)
Scheduled per los. Gave avs and calendar  

## 2018-09-02 DIAGNOSIS — R2681 Unsteadiness on feet: Secondary | ICD-10-CM | POA: Diagnosis not present

## 2018-09-02 DIAGNOSIS — M545 Low back pain: Secondary | ICD-10-CM | POA: Diagnosis not present

## 2018-09-02 DIAGNOSIS — M48061 Spinal stenosis, lumbar region without neurogenic claudication: Secondary | ICD-10-CM | POA: Diagnosis not present

## 2018-09-04 DIAGNOSIS — M48061 Spinal stenosis, lumbar region without neurogenic claudication: Secondary | ICD-10-CM | POA: Diagnosis not present

## 2018-09-04 DIAGNOSIS — M545 Low back pain: Secondary | ICD-10-CM | POA: Diagnosis not present

## 2018-09-04 DIAGNOSIS — R2681 Unsteadiness on feet: Secondary | ICD-10-CM | POA: Diagnosis not present

## 2018-09-08 DIAGNOSIS — K219 Gastro-esophageal reflux disease without esophagitis: Secondary | ICD-10-CM | POA: Diagnosis not present

## 2018-09-08 DIAGNOSIS — J301 Allergic rhinitis due to pollen: Secondary | ICD-10-CM | POA: Diagnosis not present

## 2018-09-08 DIAGNOSIS — R490 Dysphonia: Secondary | ICD-10-CM | POA: Diagnosis not present

## 2018-09-09 DIAGNOSIS — M48061 Spinal stenosis, lumbar region without neurogenic claudication: Secondary | ICD-10-CM | POA: Diagnosis not present

## 2018-09-09 DIAGNOSIS — R2681 Unsteadiness on feet: Secondary | ICD-10-CM | POA: Diagnosis not present

## 2018-09-09 DIAGNOSIS — M545 Low back pain: Secondary | ICD-10-CM | POA: Diagnosis not present

## 2018-09-11 DIAGNOSIS — M545 Low back pain: Secondary | ICD-10-CM | POA: Diagnosis not present

## 2018-09-11 DIAGNOSIS — R2681 Unsteadiness on feet: Secondary | ICD-10-CM | POA: Diagnosis not present

## 2018-09-11 DIAGNOSIS — M48061 Spinal stenosis, lumbar region without neurogenic claudication: Secondary | ICD-10-CM | POA: Diagnosis not present

## 2018-09-16 DIAGNOSIS — M48061 Spinal stenosis, lumbar region without neurogenic claudication: Secondary | ICD-10-CM | POA: Diagnosis not present

## 2018-09-16 DIAGNOSIS — M545 Low back pain: Secondary | ICD-10-CM | POA: Diagnosis not present

## 2018-09-16 DIAGNOSIS — R2681 Unsteadiness on feet: Secondary | ICD-10-CM | POA: Diagnosis not present

## 2018-09-18 DIAGNOSIS — M545 Low back pain: Secondary | ICD-10-CM | POA: Diagnosis not present

## 2018-09-18 DIAGNOSIS — M48061 Spinal stenosis, lumbar region without neurogenic claudication: Secondary | ICD-10-CM | POA: Diagnosis not present

## 2018-09-18 DIAGNOSIS — R2681 Unsteadiness on feet: Secondary | ICD-10-CM | POA: Diagnosis not present

## 2018-09-22 DIAGNOSIS — R2681 Unsteadiness on feet: Secondary | ICD-10-CM | POA: Diagnosis not present

## 2018-09-22 DIAGNOSIS — M545 Low back pain: Secondary | ICD-10-CM | POA: Diagnosis not present

## 2018-09-22 DIAGNOSIS — M48061 Spinal stenosis, lumbar region without neurogenic claudication: Secondary | ICD-10-CM | POA: Diagnosis not present

## 2018-09-24 DIAGNOSIS — M48061 Spinal stenosis, lumbar region without neurogenic claudication: Secondary | ICD-10-CM | POA: Diagnosis not present

## 2018-09-25 DIAGNOSIS — R2681 Unsteadiness on feet: Secondary | ICD-10-CM | POA: Diagnosis not present

## 2018-09-25 DIAGNOSIS — M48061 Spinal stenosis, lumbar region without neurogenic claudication: Secondary | ICD-10-CM | POA: Diagnosis not present

## 2018-09-25 DIAGNOSIS — M545 Low back pain: Secondary | ICD-10-CM | POA: Diagnosis not present

## 2018-09-29 ENCOUNTER — Other Ambulatory Visit: Payer: Self-pay | Admitting: Hematology

## 2018-09-30 DIAGNOSIS — M48061 Spinal stenosis, lumbar region without neurogenic claudication: Secondary | ICD-10-CM | POA: Diagnosis not present

## 2018-09-30 DIAGNOSIS — R2681 Unsteadiness on feet: Secondary | ICD-10-CM | POA: Diagnosis not present

## 2018-09-30 DIAGNOSIS — M545 Low back pain: Secondary | ICD-10-CM | POA: Diagnosis not present

## 2018-10-02 DIAGNOSIS — M545 Low back pain: Secondary | ICD-10-CM | POA: Diagnosis not present

## 2018-10-02 DIAGNOSIS — H40153 Residual stage of open-angle glaucoma, bilateral: Secondary | ICD-10-CM | POA: Diagnosis not present

## 2018-10-02 DIAGNOSIS — R2681 Unsteadiness on feet: Secondary | ICD-10-CM | POA: Diagnosis not present

## 2018-10-02 DIAGNOSIS — M48061 Spinal stenosis, lumbar region without neurogenic claudication: Secondary | ICD-10-CM | POA: Diagnosis not present

## 2018-10-07 DIAGNOSIS — M48061 Spinal stenosis, lumbar region without neurogenic claudication: Secondary | ICD-10-CM | POA: Diagnosis not present

## 2018-10-07 DIAGNOSIS — R2681 Unsteadiness on feet: Secondary | ICD-10-CM | POA: Diagnosis not present

## 2018-10-07 DIAGNOSIS — M545 Low back pain: Secondary | ICD-10-CM | POA: Diagnosis not present

## 2018-10-09 DIAGNOSIS — M48061 Spinal stenosis, lumbar region without neurogenic claudication: Secondary | ICD-10-CM | POA: Diagnosis not present

## 2018-10-09 DIAGNOSIS — M545 Low back pain: Secondary | ICD-10-CM | POA: Diagnosis not present

## 2018-10-09 DIAGNOSIS — R2681 Unsteadiness on feet: Secondary | ICD-10-CM | POA: Diagnosis not present

## 2018-10-13 DIAGNOSIS — J387 Other diseases of larynx: Secondary | ICD-10-CM | POA: Diagnosis not present

## 2018-10-13 DIAGNOSIS — R490 Dysphonia: Secondary | ICD-10-CM | POA: Diagnosis not present

## 2018-10-16 DIAGNOSIS — R2681 Unsteadiness on feet: Secondary | ICD-10-CM | POA: Diagnosis not present

## 2018-10-16 DIAGNOSIS — M545 Low back pain: Secondary | ICD-10-CM | POA: Diagnosis not present

## 2018-10-16 DIAGNOSIS — M48061 Spinal stenosis, lumbar region without neurogenic claudication: Secondary | ICD-10-CM | POA: Diagnosis not present

## 2018-10-21 DIAGNOSIS — R2681 Unsteadiness on feet: Secondary | ICD-10-CM | POA: Diagnosis not present

## 2018-10-21 DIAGNOSIS — M545 Low back pain: Secondary | ICD-10-CM | POA: Diagnosis not present

## 2018-10-21 DIAGNOSIS — M48061 Spinal stenosis, lumbar region without neurogenic claudication: Secondary | ICD-10-CM | POA: Diagnosis not present

## 2018-10-22 ENCOUNTER — Other Ambulatory Visit: Payer: Self-pay | Admitting: Hematology

## 2018-10-22 DIAGNOSIS — C8298 Follicular lymphoma, unspecified, lymph nodes of multiple sites: Secondary | ICD-10-CM

## 2018-10-23 DIAGNOSIS — R2681 Unsteadiness on feet: Secondary | ICD-10-CM | POA: Diagnosis not present

## 2018-10-23 DIAGNOSIS — M545 Low back pain: Secondary | ICD-10-CM | POA: Diagnosis not present

## 2018-10-23 DIAGNOSIS — M48061 Spinal stenosis, lumbar region without neurogenic claudication: Secondary | ICD-10-CM | POA: Diagnosis not present

## 2018-11-03 DIAGNOSIS — I252 Old myocardial infarction: Secondary | ICD-10-CM | POA: Diagnosis not present

## 2018-11-03 DIAGNOSIS — I10 Essential (primary) hypertension: Secondary | ICD-10-CM | POA: Diagnosis not present

## 2018-11-03 DIAGNOSIS — E785 Hyperlipidemia, unspecified: Secondary | ICD-10-CM | POA: Diagnosis not present

## 2018-11-03 DIAGNOSIS — M1990 Unspecified osteoarthritis, unspecified site: Secondary | ICD-10-CM | POA: Diagnosis not present

## 2018-11-03 DIAGNOSIS — I480 Paroxysmal atrial fibrillation: Secondary | ICD-10-CM | POA: Diagnosis not present

## 2018-11-03 DIAGNOSIS — I2510 Atherosclerotic heart disease of native coronary artery without angina pectoris: Secondary | ICD-10-CM | POA: Diagnosis not present

## 2018-11-03 DIAGNOSIS — F17290 Nicotine dependence, other tobacco product, uncomplicated: Secondary | ICD-10-CM | POA: Diagnosis not present

## 2018-11-03 DIAGNOSIS — E119 Type 2 diabetes mellitus without complications: Secondary | ICD-10-CM | POA: Diagnosis not present

## 2018-12-31 NOTE — Progress Notes (Signed)
HEMATOLOGY/ONCOLOGY CLINIC NOTE  Date of Service: 01/01/2019   Patient Care Team: Burnard Hawthorne, FNP as PCP - General (Family Medicine) Physical medicine and rehabilitation: Sharlet Salina DO  CHIEF COMPLAINTS/PURPOSE OF CONSULTATION:  F/u for follicular lymphoma  INTERVAL HISTORY  Samantha [name] is here for management and evaluation of her follicular lymphoma. The patient's last visit with [place] was on 09/01/2018. The pt reports that she is doing well overall.   She saw an ENT, Dr. Pryor Ochoa, who advised her that she had two spots on her vocal cords and that he could remove them, he was not concerned that it was cancer.  Lab results today (01/01/19) of CBC w/diff and CMP is as follows: all values are WNL except for Glucose at 165 and GFR Est Non Af Am at 56.   On review of systems, pt reports leg pain when walking and denies fever, infections, sudden weight loss and any other symptoms.    MEDICAL HISTORY:  Past Medical History:  Diagnosis Date   A-fib [facility])    Arthritis    Chronic back pain    COPD (chronic obstructive pulmonary disease) (HCC)    Coronary artery disease    Glaucoma    Hearing aid worn    High cholesterol    Hypertension    Lymphadenopathy 01/25/2016   Myocardial infarction ([place])    2000   Non Hodgkin's lymphoma ([place])    Wears dentures    " Top plate"   Wears glasses   #1 bilateral cataracts #2 COPD  #3 active smoker half a pack per day for about 60 years #4 degenerative arthritis - has had chronic back pain issues with multiple epidural steroid injections from 2014 [5 times] #5 glaucoma #6 degenerative arthritis #7 history of TIA #8 dyslipidemia #9 hypertension #10 coronary artery disease with inferior wall MI in the past, status post PCI to RCA in 2001 #11 hypothyroidism #12 lumbar radiculopathy/neuropathy. #13 paroxysmal atrial fibrillation on anticoagulation with Eliquis. #14 type 2 diabetes #16 obesity  SURGICAL  HISTORY:  #1 cardiac catheterization #2 cholecystectomy #3 colonoscopy #4 wisdom teeth extraction.  SOCIAL HISTORY: Currently single. Notes that she lost her female partner 9 months ago. Active smoker half pack per day since her teens  FAMILY HISTORY: Patient reports no history of cancers or blood disorders in the family that she is aware of.  ALLERGIES:  is allergic to hydrocodone and oxycodone.  MEDICATIONS:  Current Outpatient Medications  Medication Sig Dispense Refill   ALPRAZolam (XANAX) 0.25 MG tablet Take 0.25 mg by mouth daily as needed for anxiety.      amiodarone (PACERONE) 200 MG tablet Take 100 mg by mouth every morning.     amLODipine (NORVASC) 5 MG tablet Take 2.5 mg by mouth daily.     atorvastatin (LIPITOR) 40 MG tablet Take 40 mg by mouth every morning.      ELIQUIS 5 MG TABS tablet Take 5 mg by mouth 2 (two) times daily.     fluticasone (FLONASE) 50 MCG/ACT nasal spray Place 2 sprays into both nostrils daily. 16 g 3   gabapentin (NEURONTIN) 300 MG capsule Take 300 mg by mouth 2 (two) times daily as needed (neuropathy).      metoprolol succinate (TOPROL-XL) 50 MG 24 hr tablet Take 50 mg by mouth daily.     nitroGLYCERIN (NITROSTAT) 0.4 MG SL tablet Place 0.4 mg under the tongue every 5 (five) minutes x 3 doses as needed for chest pain.  pantoprazole (PROTONIX) 40 MG tablet TAKE 1 TABLET BY MOUTH EVERY DAY 30 tablet 2   sertraline (ZOLOFT) 50 MG tablet Take 1 tablet (50 mg total) by mouth at bedtime. 90 tablet 3   timolol (TIMOPTIC) 0.5 % ophthalmic solution Place 1 drop into both eyes 2 (two) times daily. Instill 1 drop in each eye in the morning and one drop in each eye at night     traMADol (ULTRAM) 50 MG tablet Take 1-2 tablets (50-100 mg total) by mouth every 6 (six) hours as needed. 30 tablet 1   No current facility-administered medications for this visit.     REVIEW OF SYSTEMS:    A 10+ POINT REVIEW OF SYSTEMS WAS OBTAINED including  neurology, dermatology, psychiatry, cardiac, respiratory, lymph, extremities, GI, GU, Musculoskeletal, constitutional, breasts, reproductive, HEENT.  All pertinent positives are noted in the HPI.  All others are negative.     PHYSICAL EXAMINATION:  ECOG FS:2 - Symptomatic, <50% confined to bed  There were no vitals filed for this visit. Wt Readings from Last 3 Encounters:  09/01/18 213 lb 8 oz (96.8 kg)  05/26/18 215 lb 3.2 oz (97.6 kg)  04/25/18 210 lb (95.3 kg)   There is no height or weight on file to calculate BMI.    GENERAL:alert, in no acute distress and comfortable SKIN: no acute rashes, no significant lesions EYES: conjunctiva are pink and non-injected, sclera anicteric OROPHARYNX: MMM, no exudates, no oropharyngeal erythema or ulceration NECK: supple, no JVD LYMPH:  no palpable lymphadenopathy in the cervical, axillary or inguinal regions LUNGS: clear to auscultation b/l with normal respiratory effort HEART: regular rate & rhythm ABDOMEN:  normoactive bowel sounds , non tender, not distended. Extremity: no pedal edema PSYCH: alert & oriented x 3 with fluent speech NEURO: no focal motor/sensory deficits    LABORATORY DATA:  I have reviewed the data as listed  . CBC Latest Ref Rng & Units 09/01/2018 04/18/2018 03/03/2018  WBC 4.0 - 10.5 K/uL 8.1 9.4 9.9  Hemoglobin 12.0 - 15.0 g/dL 13.0 13.0 12.9  Hematocrit 36.0 - 46.0 % 41.3 42.0 41.0  Platelets 150 - 400 K/uL 205 189 162   . CBC    Component Value Date/Time   WBC 8.1 09/01/2018 1132   RBC 4.38 09/01/2018 1132   HGB 13.0 09/01/2018 1132   HGB 13.9 02/18/2018 1116   HGB 12.7 03/04/2017 1349   HCT 41.3 09/01/2018 1132   HCT 39.8 03/04/2017 1349   PLT 205 09/01/2018 1132   PLT 196 02/18/2018 1116   PLT 180 03/04/2017 1349   MCV 94.3 09/01/2018 1132   MCV 99.0 03/04/2017 1349   MCH 29.7 09/01/2018 1132   MCHC 31.5 09/01/2018 1132   RDW 13.6 09/01/2018 1132   RDW 12.9 03/04/2017 1349   LYMPHSABS 2.1  09/01/2018 1132   LYMPHSABS 1.9 03/04/2017 1349   MONOABS 0.7 09/01/2018 1132   MONOABS 0.6 03/04/2017 1349   EOSABS 0.3 09/01/2018 1132   EOSABS 0.2 03/04/2017 1349   BASOSABS 0.1 09/01/2018 1132   BASOSABS 0.0 03/04/2017 1349    . CMP Latest Ref Rng & Units 09/01/2018 06/03/2018 04/18/2018  Glucose 70 - 99 mg/dL 88 108(H) 137(H)  BUN 8 - 23 mg/dL 9 13 12   Creatinine 0.44 - 1.00 mg/dL 0.83 0.76 0.91  Sodium 135 - 145 mmol/L 142 143 142  Potassium 3.5 - 5.1 mmol/L 4.5 4.2 4.9  Chloride 98 - 111 mmol/L 106 105 107  CO2 22 - 32 mmol/L 27 32  28  Calcium 8.9 - 10.3 mg/dL 8.7(L) 9.0 8.9  Total Protein 6.5 - 8.1 g/dL 6.6 6.5 -  Total Bilirubin 0.3 - 1.2 mg/dL 0.4 0.4 -  Alkaline Phos 38 - 126 U/L 73 72 -  AST 15 - 41 U/L 19 13 -  ALT 0 - 44 U/L 18 9 -   . Lab Results  Component Value Date   LDH 191 09/01/2018    .          RADIOGRAPHIC STUDIES: I have personally reviewed the radiological images as listed and agreed with the findings in the report. No results found.  PET/CT 12/22/2015  IMPRESSION: 1. Hypermetabolic confluent lower retroperitoneal / bilateral common iliac, right external iliac and right inguinal lymphadenopathy, consistent with lymphoma. 2. Solitary focus of osseous hypermetabolism in the left upper sacrum consistent with osseous involvement by lymphoma. 3. No metabolically active lymphoma in the neck or chest. Spleen is normal in size and metabolism. 4. Additional findings include aortic atherosclerosis, three-vessel coronary atherosclerosis and mild sigmoid diverticulosis.   Electronically Signed   By: Ilona Sorrel M.D.   On: 12/22/2015 17:21   ASSESSMENT & PLAN:   78 y.o. Caucasian female with multiple chronic medical comorbidities as noted above with   #1 Stage IVB Low grade Follicular Lymphoma with extensive retroperitoneal lymphadenopathy and right inguinal lymphadenopathy as well as osseous involvement of the sacrum. LDH level  within normal limits.  S/p 5 cycles of BR (Bendamustine 70mg /m2)  PET CT scan after 5 cycles of Bendamustine and Rituxan done on 09/06/2016 shows no residual metabolic active disease and no evidence of disease progression.  CT C/A/P on 05/20/17 reveals no radiographic evidence of disease progression.   02/19/18 CT C/A/P revealed Stable mild retroperitoneal adenopathy, compatible with treated lymphoma. No new or progressive adenopathy. No new sites of disease. Normal size spleen. 2. Three-vessel coronary atherosclerosis. 3. Aortic Atherosclerosis.  #2 Rt inguinal LN biopsy site infection and seroma -now resolved  #3 New persistent hoarseness/change in voice - being followed by ENT in [place]:  -Discussed pt labwork today, 01/01/19; all values are WNL except for Glucose at 165 and GFR Est Non Af Am at 56. -no lab or clinical evidence of follicular lymphoma progression at this time -Discussed peripheral arterial disease resulting from smoking as a cause of her leg cramps and for her to follow up with PCP, pt does not want to stop smoking  -rpt CT based on lab or clinical concerns for disease progression.   FOLLOW UP: RTC with Dr Irene Limbo with labs in 6 months   The total time spent in the appt was 20 minutes and more than 50% was on counseling and direct patient cares.  All of the patient's questions were answered with apparent satisfaction. The patient knows to call the clinic with any problems, questions or concerns.   Sullivan Lone MD MS AAHIVMS [facility] Hematology/Oncology Physician [facility]  (Office):       [phone] (Work cell):  [phone] (Fax):           [phone]  I, Scot Dock, am acting as a scribe for Dr. Sullivan Lone.   .I have reviewed the above documentation for accuracy and completeness, and I agree with the above. Brunetta Genera MD

## 2019-01-01 ENCOUNTER — Other Ambulatory Visit: Payer: Self-pay

## 2019-01-01 ENCOUNTER — Telehealth: Payer: Self-pay | Admitting: Hematology

## 2019-01-01 ENCOUNTER — Inpatient Hospital Stay: Payer: PPO

## 2019-01-01 ENCOUNTER — Inpatient Hospital Stay: Payer: PPO | Attending: Hematology | Admitting: Hematology

## 2019-01-01 VITALS — BP 129/59 | HR 65 | Temp 98.3°F | Resp 17 | Ht 69.0 in | Wt 211.6 lb

## 2019-01-01 DIAGNOSIS — Z7901 Long term (current) use of anticoagulants: Secondary | ICD-10-CM | POA: Diagnosis not present

## 2019-01-01 DIAGNOSIS — I480 Paroxysmal atrial fibrillation: Secondary | ICD-10-CM | POA: Diagnosis not present

## 2019-01-01 DIAGNOSIS — R252 Cramp and spasm: Secondary | ICD-10-CM | POA: Insufficient documentation

## 2019-01-01 DIAGNOSIS — J449 Chronic obstructive pulmonary disease, unspecified: Secondary | ICD-10-CM | POA: Diagnosis not present

## 2019-01-01 DIAGNOSIS — I10 Essential (primary) hypertension: Secondary | ICD-10-CM | POA: Diagnosis not present

## 2019-01-01 DIAGNOSIS — I252 Old myocardial infarction: Secondary | ICD-10-CM | POA: Insufficient documentation

## 2019-01-01 DIAGNOSIS — F17210 Nicotine dependence, cigarettes, uncomplicated: Secondary | ICD-10-CM | POA: Diagnosis not present

## 2019-01-01 DIAGNOSIS — C8298 Follicular lymphoma, unspecified, lymph nodes of multiple sites: Secondary | ICD-10-CM

## 2019-01-01 DIAGNOSIS — Z9221 Personal history of antineoplastic chemotherapy: Secondary | ICD-10-CM | POA: Insufficient documentation

## 2019-01-01 DIAGNOSIS — E119 Type 2 diabetes mellitus without complications: Secondary | ICD-10-CM | POA: Insufficient documentation

## 2019-01-01 DIAGNOSIS — R490 Dysphonia: Secondary | ICD-10-CM | POA: Diagnosis not present

## 2019-01-01 DIAGNOSIS — Z8572 Personal history of non-Hodgkin lymphomas: Secondary | ICD-10-CM | POA: Diagnosis not present

## 2019-01-01 LAB — CBC WITH DIFFERENTIAL/PLATELET
Basophils Absolute: 0 10*3/uL (ref 0.0–0.1)
Basophils Relative: 1 %
Eosinophils Absolute: 0.2 10*3/uL (ref 0.0–0.5)
Eosinophils Relative: 3 %
HCT: 40.8 % (ref 36.0–46.0)
Hemoglobin: 13 g/dL (ref 12.0–15.0)
Lymphocytes Relative: 16 %
Lymphs Abs: 1.2 10*3/uL (ref 0.7–4.0)
MCH: 29.9 pg (ref 26.0–34.0)
MCHC: 31.9 g/dL (ref 30.0–36.0)
MCV: 93.8 fL (ref 80.0–100.0)
Monocytes Absolute: 0.6 10*3/uL (ref 0.1–1.0)
Monocytes Relative: 8 %
Neutro Abs: 5.1 10*3/uL (ref 1.7–7.7)
Neutrophils Relative %: 71 %
Platelets: 217 10*3/uL (ref 150–400)
RBC: 4.35 MIL/uL (ref 3.87–5.11)
RDW: 13.2 % (ref 11.5–15.5)
WBC: 7.1 10*3/uL (ref 4.0–10.5)
nRBC: 0 /100 WBC

## 2019-01-01 LAB — CMP (CANCER CENTER ONLY)
ALT: 11 U/L (ref 0–44)
AST: 19 U/L (ref 15–41)
Albumin: 3.5 g/dL (ref 3.5–5.0)
Alkaline Phosphatase: 80 U/L (ref 38–126)
Anion gap: 11 (ref 5–15)
BUN: 10 mg/dL (ref 8–23)
CO2: 29 mmol/L (ref 22–32)
Calcium: 9.1 mg/dL (ref 8.9–10.3)
Chloride: 104 mmol/L (ref 98–111)
Creatinine: 0.98 mg/dL (ref 0.44–1.00)
GFR, Est AFR Am: >60 mL/min (ref >60)
GFR, Estimated: 56 mL/min — ABNORMAL LOW (ref >60)
Glucose, Bld: 164 mg/dL — ABNORMAL HIGH (ref 70–99)
Potassium: 3.9 mmol/L (ref 3.5–5.1)
Sodium: 144 mmol/L (ref 135–145)
Total Bilirubin: 0.4 mg/dL (ref 0.3–1.2)
Total Protein: 6.7 g/dL (ref 6.5–8.1)

## 2019-01-01 LAB — LACTATE DEHYDROGENASE: LDH: 190 U/L (ref 98–192)

## 2019-01-01 NOTE — Telephone Encounter (Signed)
Scheduled appt per 10/15 los - gave patient AVS and calender per los.   

## 2019-01-08 DIAGNOSIS — M62838 Other muscle spasm: Secondary | ICD-10-CM | POA: Diagnosis not present

## 2019-01-08 DIAGNOSIS — M542 Cervicalgia: Secondary | ICD-10-CM | POA: Diagnosis not present

## 2019-01-08 DIAGNOSIS — J387 Other diseases of larynx: Secondary | ICD-10-CM | POA: Diagnosis not present

## 2019-01-08 DIAGNOSIS — M9904 Segmental and somatic dysfunction of sacral region: Secondary | ICD-10-CM | POA: Diagnosis not present

## 2019-01-08 DIAGNOSIS — M9901 Segmental and somatic dysfunction of cervical region: Secondary | ICD-10-CM | POA: Diagnosis not present

## 2019-01-08 DIAGNOSIS — M545 Low back pain: Secondary | ICD-10-CM | POA: Diagnosis not present

## 2019-01-08 DIAGNOSIS — M9903 Segmental and somatic dysfunction of lumbar region: Secondary | ICD-10-CM | POA: Diagnosis not present

## 2019-01-08 DIAGNOSIS — M461 Sacroiliitis, not elsewhere classified: Secondary | ICD-10-CM | POA: Diagnosis not present

## 2019-01-08 DIAGNOSIS — R490 Dysphonia: Secondary | ICD-10-CM | POA: Diagnosis not present

## 2019-01-08 DIAGNOSIS — F17200 Nicotine dependence, unspecified, uncomplicated: Secondary | ICD-10-CM | POA: Diagnosis not present

## 2019-01-15 ENCOUNTER — Other Ambulatory Visit: Payer: Self-pay | Admitting: Hematology

## 2019-01-15 DIAGNOSIS — C8298 Follicular lymphoma, unspecified, lymph nodes of multiple sites: Secondary | ICD-10-CM

## 2019-01-28 DIAGNOSIS — Z6830 Body mass index (BMI) 30.0-30.9, adult: Secondary | ICD-10-CM | POA: Diagnosis not present

## 2019-01-28 DIAGNOSIS — I10 Essential (primary) hypertension: Secondary | ICD-10-CM | POA: Diagnosis not present

## 2019-01-28 DIAGNOSIS — M48061 Spinal stenosis, lumbar region without neurogenic claudication: Secondary | ICD-10-CM | POA: Diagnosis not present

## 2019-02-04 DIAGNOSIS — I2510 Atherosclerotic heart disease of native coronary artery without angina pectoris: Secondary | ICD-10-CM | POA: Diagnosis not present

## 2019-02-04 DIAGNOSIS — H40153 Residual stage of open-angle glaucoma, bilateral: Secondary | ICD-10-CM | POA: Diagnosis not present

## 2019-02-04 DIAGNOSIS — I10 Essential (primary) hypertension: Secondary | ICD-10-CM | POA: Diagnosis not present

## 2019-02-04 DIAGNOSIS — E785 Hyperlipidemia, unspecified: Secondary | ICD-10-CM | POA: Diagnosis not present

## 2019-02-04 DIAGNOSIS — I480 Paroxysmal atrial fibrillation: Secondary | ICD-10-CM | POA: Diagnosis not present

## 2019-02-04 DIAGNOSIS — M1990 Unspecified osteoarthritis, unspecified site: Secondary | ICD-10-CM | POA: Diagnosis not present

## 2019-02-04 DIAGNOSIS — E119 Type 2 diabetes mellitus without complications: Secondary | ICD-10-CM | POA: Diagnosis not present

## 2019-02-04 DIAGNOSIS — F17290 Nicotine dependence, other tobacco product, uncomplicated: Secondary | ICD-10-CM | POA: Diagnosis not present

## 2019-03-06 DIAGNOSIS — Z20828 Contact with and (suspected) exposure to other viral communicable diseases: Secondary | ICD-10-CM | POA: Diagnosis not present

## 2019-04-14 ENCOUNTER — Other Ambulatory Visit: Payer: Self-pay | Admitting: Family

## 2019-04-14 DIAGNOSIS — F411 Generalized anxiety disorder: Secondary | ICD-10-CM

## 2019-05-13 DIAGNOSIS — F3342 Major depressive disorder, recurrent, in full remission: Secondary | ICD-10-CM | POA: Diagnosis not present

## 2019-05-13 DIAGNOSIS — M461 Sacroiliitis, not elsewhere classified: Secondary | ICD-10-CM | POA: Diagnosis not present

## 2019-05-13 DIAGNOSIS — I480 Paroxysmal atrial fibrillation: Secondary | ICD-10-CM | POA: Diagnosis not present

## 2019-05-13 DIAGNOSIS — I25118 Atherosclerotic heart disease of native coronary artery with other forms of angina pectoris: Secondary | ICD-10-CM | POA: Diagnosis not present

## 2019-05-13 DIAGNOSIS — G629 Polyneuropathy, unspecified: Secondary | ICD-10-CM | POA: Diagnosis not present

## 2019-05-13 DIAGNOSIS — I70203 Unspecified atherosclerosis of native arteries of extremities, bilateral legs: Secondary | ICD-10-CM | POA: Diagnosis not present

## 2019-05-13 DIAGNOSIS — J449 Chronic obstructive pulmonary disease, unspecified: Secondary | ICD-10-CM | POA: Diagnosis not present

## 2019-05-13 DIAGNOSIS — C8590 Non-Hodgkin lymphoma, unspecified, unspecified site: Secondary | ICD-10-CM | POA: Diagnosis not present

## 2019-05-13 DIAGNOSIS — Z7901 Long term (current) use of anticoagulants: Secondary | ICD-10-CM | POA: Diagnosis not present

## 2019-05-13 DIAGNOSIS — D6869 Other thrombophilia: Secondary | ICD-10-CM | POA: Diagnosis not present

## 2019-05-25 DIAGNOSIS — E785 Hyperlipidemia, unspecified: Secondary | ICD-10-CM | POA: Diagnosis not present

## 2019-05-25 DIAGNOSIS — F17290 Nicotine dependence, other tobacco product, uncomplicated: Secondary | ICD-10-CM | POA: Diagnosis not present

## 2019-05-25 DIAGNOSIS — E119 Type 2 diabetes mellitus without complications: Secondary | ICD-10-CM | POA: Diagnosis not present

## 2019-05-25 DIAGNOSIS — M1990 Unspecified osteoarthritis, unspecified site: Secondary | ICD-10-CM | POA: Diagnosis not present

## 2019-05-25 DIAGNOSIS — I480 Paroxysmal atrial fibrillation: Secondary | ICD-10-CM | POA: Diagnosis not present

## 2019-05-25 DIAGNOSIS — I2510 Atherosclerotic heart disease of native coronary artery without angina pectoris: Secondary | ICD-10-CM | POA: Diagnosis not present

## 2019-05-25 DIAGNOSIS — I10 Essential (primary) hypertension: Secondary | ICD-10-CM | POA: Diagnosis not present

## 2019-05-29 DIAGNOSIS — L814 Other melanin hyperpigmentation: Secondary | ICD-10-CM | POA: Diagnosis not present

## 2019-05-29 DIAGNOSIS — L720 Epidermal cyst: Secondary | ICD-10-CM | POA: Diagnosis not present

## 2019-05-29 DIAGNOSIS — L820 Inflamed seborrheic keratosis: Secondary | ICD-10-CM | POA: Diagnosis not present

## 2019-05-29 DIAGNOSIS — L821 Other seborrheic keratosis: Secondary | ICD-10-CM | POA: Diagnosis not present

## 2019-05-29 DIAGNOSIS — L570 Actinic keratosis: Secondary | ICD-10-CM | POA: Diagnosis not present

## 2019-05-29 DIAGNOSIS — D492 Neoplasm of unspecified behavior of bone, soft tissue, and skin: Secondary | ICD-10-CM | POA: Diagnosis not present

## 2019-06-08 DIAGNOSIS — H40153 Residual stage of open-angle glaucoma, bilateral: Secondary | ICD-10-CM | POA: Diagnosis not present

## 2019-07-03 ENCOUNTER — Inpatient Hospital Stay: Payer: PPO

## 2019-07-03 ENCOUNTER — Inpatient Hospital Stay: Payer: PPO | Attending: Hematology | Admitting: Hematology

## 2019-07-06 ENCOUNTER — Telehealth: Payer: Self-pay

## 2019-07-06 NOTE — Telephone Encounter (Signed)
Received call from pt. regarding her missed appt on 07/03/19. She would like to rescheduled to sometime this week preferably in the morning.  Scheduling message sent.

## 2019-07-09 DIAGNOSIS — F17200 Nicotine dependence, unspecified, uncomplicated: Secondary | ICD-10-CM | POA: Diagnosis not present

## 2019-07-09 DIAGNOSIS — R490 Dysphonia: Secondary | ICD-10-CM | POA: Diagnosis not present

## 2019-07-09 DIAGNOSIS — J387 Other diseases of larynx: Secondary | ICD-10-CM | POA: Diagnosis not present

## 2019-07-20 ENCOUNTER — Other Ambulatory Visit: Payer: Self-pay | Admitting: Physical Medicine and Rehabilitation

## 2019-07-20 DIAGNOSIS — M5442 Lumbago with sciatica, left side: Secondary | ICD-10-CM | POA: Diagnosis not present

## 2019-07-20 DIAGNOSIS — M5416 Radiculopathy, lumbar region: Secondary | ICD-10-CM | POA: Diagnosis not present

## 2019-07-20 DIAGNOSIS — M48062 Spinal stenosis, lumbar region with neurogenic claudication: Secondary | ICD-10-CM | POA: Diagnosis not present

## 2019-07-20 DIAGNOSIS — M5136 Other intervertebral disc degeneration, lumbar region: Secondary | ICD-10-CM | POA: Diagnosis not present

## 2019-07-23 NOTE — Progress Notes (Signed)
HEMATOLOGY/ONCOLOGY CLINIC NOTE  Date of Service: 01/01/2019   Patient Care Team: Alessandra Bevels, MD as PCP - General Physical medicine and rehabilitation: Sharlet Salina DO  CHIEF COMPLAINTS/PURPOSE OF CONSULTATION:  F/u for follicular lymphoma  INTERVAL HISTORY  Samantha [name] is here for management and evaluation of her follicular lymphoma. The patient's last visit with [place] was on 01/01/2019. The pt reports that she is doing well overall.  The pt reports she is good. Pt has gotten both doses of COVID19 vaccine. Over the last couple of months when pt eats she has been having diarrhea. She is still smoking. Pt is still having heart burns and is out of protonix.   Lab results today (07/24/19) of CBC w/diff and CMP is as follows: all values are WNL except for Glucose at 104 07/24/19 of LDH at 172  On review of systems, pt reports healthy appetite, diarrhea, heart burns and denies fever, chills, nigh sweats, unexpected weight loss, new lumps/ bumps, skin rashes, SOB and any other symptoms.   MEDICAL HISTORY:  Past Medical History:  Diagnosis Date  . A-fib ([place])   . Arthritis   . Chronic back pain   . COPD (chronic obstructive pulmonary disease) ([place])   . Coronary artery disease   . Glaucoma   . Hearing aid worn   . High cholesterol   . Hypertension   . Lymphadenopathy 01/25/2016  . Myocardial infarction ([place])    2000  . Non Hodgkin's lymphoma ([place])   . Wears dentures    " Top plate"  . Wears glasses   #1 bilateral cataracts #2 COPD  #3 active smoker half a pack per day for about 60 years #4 degenerative arthritis - has had chronic back pain issues with multiple epidural steroid injections from 2014 [5 times] #5 glaucoma #6 degenerative arthritis #7 history of TIA #8 dyslipidemia #9 hypertension #10 coronary artery disease with inferior wall MI in the past, status post PCI to RCA in 2001 #11 hypothyroidism #12 lumbar radiculopathy/neuropathy. #13 paroxysmal  atrial fibrillation on anticoagulation with Eliquis. #14 type 2 diabetes #16 obesity  SURGICAL HISTORY:  #1 cardiac catheterization #2 cholecystectomy #3 colonoscopy #4 wisdom teeth extraction.  SOCIAL HISTORY: Currently single. Notes that she lost her female partner 9 months ago. Active smoker half pack per day since her teens  FAMILY HISTORY: Patient reports no history of cancers or blood disorders in the family that she is aware of.  ALLERGIES:  is allergic to hydrocodone and oxycodone.  MEDICATIONS:  Current Outpatient Medications  Medication Sig Dispense Refill  . ALPRAZolam (XANAX) 0.25 MG tablet Take 0.25 mg by mouth daily as needed for anxiety.     Marland Kitchen amiodarone (PACERONE) 200 MG tablet Take 100 mg by mouth every morning.    Marland Kitchen amLODipine (NORVASC) 5 MG tablet Take 2.5 mg by mouth daily.    Marland Kitchen atorvastatin (LIPITOR) 40 MG tablet Take 40 mg by mouth every morning.     Marland Kitchen ELIQUIS 5 MG TABS tablet Take 5 mg by mouth 2 (two) times daily.    . fluticasone (FLONASE) 50 MCG/ACT nasal spray Place 2 sprays into both nostrils daily. 16 g 3  . gabapentin (NEURONTIN) 300 MG capsule Take 300 mg by mouth 2 (two) times daily as needed (neuropathy).     . metoprolol succinate (TOPROL-XL) 50 MG 24 hr tablet Take 50 mg by mouth daily.    . nitroGLYCERIN (NITROSTAT) 0.4 MG SL tablet Place 0.4 mg under the tongue  every 5 (five) minutes x 3 doses as needed for chest pain.     . pantoprazole (PROTONIX) 40 MG tablet TAKE 1 TABLET BY MOUTH EVERY DAY 90 tablet 0  . sertraline (ZOLOFT) 50 MG tablet TAKE 1 TABLET BY MOUTH EVERYDAY AT BEDTIME 90 tablet 3  . timolol (TIMOPTIC) 0.5 % ophthalmic solution Place 1 drop into both eyes 2 (two) times daily. Instill 1 drop in each eye in the morning and one drop in each eye at night    . traMADol (ULTRAM) 50 MG tablet Take 1-2 tablets (50-100 mg total) by mouth every 6 (six) hours as needed. 30 tablet 1   No current facility-administered medications for this  visit.    REVIEW OF SYSTEMS:   A 10+ POINT REVIEW OF SYSTEMS WAS OBTAINED including neurology, dermatology, psychiatry, cardiac, respiratory, lymph, extremities, GI, GU, Musculoskeletal, constitutional, breasts, reproductive, HEENT.  All pertinent positives are noted in the HPI.  All others are negative.   PHYSICAL EXAMINATION:  ECOG FS:2 - Symptomatic, <50% confined to bed  Vitals:   07/24/19 1013  BP: (!) 124/43  Pulse: (!) 58  Resp: 18  Temp: 98.5 F (36.9 C)  SpO2: 100%   Wt Readings from Last 3 Encounters:  07/24/19 216 lb 9.6 oz (98.2 kg)  01/01/19 211 lb 9.6 oz (96 kg)  09/01/18 213 lb 8 oz (96.8 kg)   Body mass index is 31.99 kg/m.    GENERAL:alert, in no acute distress and comfortable SKIN: no acute rashes, no significant lesions EYES: conjunctiva are pink and non-injected, sclera anicteric OROPHARYNX: MMM, no exudates, no oropharyngeal erythema or ulceration NECK: supple, no JVD LYMPH:  no palpable lymphadenopathy in the cervical, axillary or inguinal regions LUNGS: clear to auscultation b/l with normal respiratory effort HEART: regular rate & rhythm ABDOMEN:  normoactive bowel sounds , non tender, not distended. Extremity: no pedal edema PSYCH: alert & oriented x 3 with fluent speech NEURO: no focal motor/sensory deficits  LABORATORY DATA:  I have reviewed the data as listed  . CBC Latest Ref Rng & Units 07/24/2019 01/01/2019 09/01/2018  WBC 4.0 - 10.5 K/uL 7.6 7.1 8.1  Hemoglobin 12.0 - 15.0 g/dL 13.0 13.0 13.0  Hematocrit 36.0 - 46.0 % 41.5 40.8 41.3  Platelets 150 - 400 K/uL 225 217 205   . CBC    Component Value Date/Time   WBC 7.6 07/24/2019 0933   RBC 4.32 07/24/2019 0933   HGB 13.0 07/24/2019 0933   HGB 13.9 02/18/2018 1116   HGB 12.7 03/04/2017 1349   HCT 41.5 07/24/2019 0933   HCT 39.8 03/04/2017 1349   PLT 225 07/24/2019 0933   PLT 196 02/18/2018 1116   PLT 180 03/04/2017 1349   MCV 96.1 07/24/2019 0933   MCV 99.0 03/04/2017 1349    MCH 30.1 07/24/2019 0933   MCHC 31.3 07/24/2019 0933   RDW 13.7 07/24/2019 0933   RDW 12.9 03/04/2017 1349   LYMPHSABS 1.6 07/24/2019 0933   LYMPHSABS 1.9 03/04/2017 1349   MONOABS 0.7 07/24/2019 0933   MONOABS 0.6 03/04/2017 1349   EOSABS 0.3 07/24/2019 0933   EOSABS 0.2 03/04/2017 1349   BASOSABS 0.1 07/24/2019 0933   BASOSABS 0.0 03/04/2017 1349    . CMP Latest Ref Rng & Units 07/24/2019 01/01/2019 09/01/2018  Glucose 70 - 99 mg/dL 104(H) 164(H) 88  BUN 8 - 23 mg/dL 9 10 9   Creatinine 0.44 - 1.00 mg/dL 0.81 0.98 0.83  Sodium 135 - 145 mmol/L 144 144 142  Potassium 3.5 - 5.1 mmol/L 4.3 3.9 4.5  Chloride 98 - 111 mmol/L 104 104 106  CO2 22 - 32 mmol/L 28 29 27   Calcium 8.9 - 10.3 mg/dL 8.9 9.1 8.7(L)  Total Protein 6.5 - 8.1 g/dL 6.7 6.7 6.6  Total Bilirubin 0.3 - 1.2 mg/dL 0.5 0.4 0.4  Alkaline Phos 38 - 126 U/L 69 80 73  AST 15 - 41 U/L 20 19 19   ALT 0 - 44 U/L 12 11 18    . Lab Results  Component Value Date   LDH 172 07/24/2019    .          RADIOGRAPHIC STUDIES: I have personally reviewed the radiological images as listed and agreed with the findings in the report. No results found.  PET/CT 12/22/2015  IMPRESSION: 1. Hypermetabolic confluent lower retroperitoneal / bilateral common iliac, right external iliac and right inguinal lymphadenopathy, consistent with lymphoma. 2. Solitary focus of osseous hypermetabolism in the left upper sacrum consistent with osseous involvement by lymphoma. 3. No metabolically active lymphoma in the neck or chest. Spleen is normal in size and metabolism. 4. Additional findings include aortic atherosclerosis, three-vessel coronary atherosclerosis and mild sigmoid diverticulosis.   Electronically Signed   By: Ilona Sorrel M.D.   On: 12/22/2015 17:21   ASSESSMENT & PLAN:   79 y.o. Caucasian female with multiple chronic medical comorbidities as noted above with   #1 Stage IVB Low grade Follicular Lymphoma with  extensive retroperitoneal lymphadenopathy and right inguinal lymphadenopathy as well as osseous involvement of the sacrum. LDH level within normal limits.  S/p 5 cycles of BR (Bendamustine 70mg /m2)  PET CT scan after 5 cycles of Bendamustine and Rituxan done on 09/06/2016 shows no residual metabolic active disease and no evidence of disease progression.  CT C/A/P on 05/20/17 reveals no radiographic evidence of disease progression.   02/19/18 CT C/A/P revealed Stable mild retroperitoneal adenopathy, compatible with treated lymphoma. No new or progressive adenopathy. No new sites of disease. Normal size spleen. 2. Three-vessel coronary atherosclerosis. 3. Aortic Atherosclerosis.  #2 Rt inguinal LN biopsy site infection and seroma -now resolved  #3 New persistent hoarseness/change in voice - being followed by ENT in [place]: -Discussed pt labwork today, 07/24/19; of CBC w/diff and CMP is as follows: all values are WNL except for Glucose at 104 -Discussed 07/24/19 of LDH at 172 -no lab or clinical evidence of follicular lymphoma progression at this time -Advised on acid suppressant  -Recommends stopping smoking, pt does not want to stop smoking  -F/u with PCP for heart burns/ acid suppressants- consider Gastroenterologist for endoscopy    -Will refill protonix- f/u with PCP for additional refills  -Possibility to alternate with PCP - Charolette Forward  -Will see back in 6 months   FOLLOW UP: RTC with Dr Irene Limbo with labs in 6 months  The total time spent in the appt was 20 minutes and more than 50% was on counseling and direct patient cares.  All of the patient's questions were answered with apparent satisfaction. The patient knows to call the clinic with any problems, questions or concerns.   Sullivan Lone MD [place] AAHIVMS [facility] Hematology/Oncology Physician [facility]  (Office):       [phone] (Work cell):  [phone] (Fax):           [phone]  I, Dawayne Cirri am acting as a scribe for Dr. Sullivan Lone.   .I have reviewed the above documentation for accuracy and completeness,  and I agree with the above. Brunetta Genera MD

## 2019-07-24 ENCOUNTER — Inpatient Hospital Stay: Payer: PPO | Attending: Hematology

## 2019-07-24 ENCOUNTER — Inpatient Hospital Stay (HOSPITAL_BASED_OUTPATIENT_CLINIC_OR_DEPARTMENT_OTHER): Payer: PPO | Admitting: Hematology

## 2019-07-24 ENCOUNTER — Telehealth: Payer: Self-pay | Admitting: Hematology

## 2019-07-24 ENCOUNTER — Other Ambulatory Visit: Payer: Self-pay

## 2019-07-24 VITALS — BP 124/43 | HR 58 | Temp 98.5°F | Resp 18 | Ht 69.0 in | Wt 216.6 lb

## 2019-07-24 DIAGNOSIS — R490 Dysphonia: Secondary | ICD-10-CM | POA: Diagnosis not present

## 2019-07-24 DIAGNOSIS — Z7689 Persons encountering health services in other specified circumstances: Secondary | ICD-10-CM | POA: Diagnosis not present

## 2019-07-24 DIAGNOSIS — F17210 Nicotine dependence, cigarettes, uncomplicated: Secondary | ICD-10-CM | POA: Insufficient documentation

## 2019-07-24 DIAGNOSIS — Z8572 Personal history of non-Hodgkin lymphomas: Secondary | ICD-10-CM | POA: Diagnosis not present

## 2019-07-24 DIAGNOSIS — C8298 Follicular lymphoma, unspecified, lymph nodes of multiple sites: Secondary | ICD-10-CM

## 2019-07-24 DIAGNOSIS — Z9221 Personal history of antineoplastic chemotherapy: Secondary | ICD-10-CM | POA: Diagnosis not present

## 2019-07-24 DIAGNOSIS — R12 Heartburn: Secondary | ICD-10-CM | POA: Diagnosis not present

## 2019-07-24 LAB — CMP (CANCER CENTER ONLY)
ALT: 12 U/L (ref 0–44)
AST: 20 U/L (ref 15–41)
Albumin: 3.5 g/dL (ref 3.5–5.0)
Alkaline Phosphatase: 69 U/L (ref 38–126)
Anion gap: 12 (ref 5–15)
BUN: 9 mg/dL (ref 8–23)
CO2: 28 mmol/L (ref 22–32)
Calcium: 8.9 mg/dL (ref 8.9–10.3)
Chloride: 104 mmol/L (ref 98–111)
Creatinine: 0.81 mg/dL (ref 0.44–1.00)
GFR, Est AFR Am: >60 mL/min (ref >60)
GFR, Estimated: >60 mL/min (ref >60)
Glucose, Bld: 104 mg/dL — ABNORMAL HIGH (ref 70–99)
Potassium: 4.3 mmol/L (ref 3.5–5.1)
Sodium: 144 mmol/L (ref 135–145)
Total Bilirubin: 0.5 mg/dL (ref 0.3–1.2)
Total Protein: 6.7 g/dL (ref 6.5–8.1)

## 2019-07-24 LAB — CBC WITH DIFFERENTIAL/PLATELET
Abs Immature Granulocytes: 0.02 10*3/uL (ref 0.00–0.07)
Basophils Absolute: 0.1 10*3/uL (ref 0.0–0.1)
Basophils Relative: 1 %
Eosinophils Absolute: 0.3 10*3/uL (ref 0.0–0.5)
Eosinophils Relative: 4 %
HCT: 41.5 % (ref 36.0–46.0)
Hemoglobin: 13 g/dL (ref 12.0–15.0)
Immature Granulocytes: 0 %
Lymphocytes Relative: 21 %
Lymphs Abs: 1.6 10*3/uL (ref 0.7–4.0)
MCH: 30.1 pg (ref 26.0–34.0)
MCHC: 31.3 g/dL (ref 30.0–36.0)
MCV: 96.1 fL (ref 80.0–100.0)
Monocytes Absolute: 0.7 10*3/uL (ref 0.1–1.0)
Monocytes Relative: 9 %
Neutro Abs: 5 10*3/uL (ref 1.7–7.7)
Neutrophils Relative %: 65 %
Platelets: 225 10*3/uL (ref 150–400)
RBC: 4.32 MIL/uL (ref 3.87–5.11)
RDW: 13.7 % (ref 11.5–15.5)
WBC: 7.6 10*3/uL (ref 4.0–10.5)
nRBC: 0 % (ref 0.0–0.2)

## 2019-07-24 LAB — LACTATE DEHYDROGENASE: LDH: 172 U/L (ref 98–192)

## 2019-07-24 NOTE — Telephone Encounter (Signed)
Scheduled per 5/7 los. Gave pt AVS and calender.

## 2019-07-30 ENCOUNTER — Ambulatory Visit
Admission: RE | Admit: 2019-07-30 | Discharge: 2019-07-30 | Disposition: A | Discharge status: Home / Self Care | Payer: PPO | Source: Ambulatory Visit | Attending: Physical Medicine and Rehabilitation | Admitting: Physical Medicine and Rehabilitation

## 2019-07-30 ENCOUNTER — Other Ambulatory Visit: Payer: Self-pay

## 2019-07-30 DIAGNOSIS — M545 Low back pain: Secondary | ICD-10-CM | POA: Diagnosis not present

## 2019-07-30 DIAGNOSIS — M5442 Lumbago with sciatica, left side: Secondary | ICD-10-CM | POA: Diagnosis not present

## 2019-08-06 DIAGNOSIS — M48062 Spinal stenosis, lumbar region with neurogenic claudication: Secondary | ICD-10-CM | POA: Diagnosis not present

## 2019-08-06 DIAGNOSIS — M5416 Radiculopathy, lumbar region: Secondary | ICD-10-CM | POA: Diagnosis not present

## 2019-08-06 DIAGNOSIS — M5136 Other intervertebral disc degeneration, lumbar region: Secondary | ICD-10-CM | POA: Diagnosis not present

## 2019-09-03 DIAGNOSIS — M5416 Radiculopathy, lumbar region: Secondary | ICD-10-CM | POA: Diagnosis not present

## 2019-09-03 DIAGNOSIS — M5136 Other intervertebral disc degeneration, lumbar region: Secondary | ICD-10-CM | POA: Diagnosis not present

## 2019-09-03 DIAGNOSIS — M48062 Spinal stenosis, lumbar region with neurogenic claudication: Secondary | ICD-10-CM | POA: Diagnosis not present

## 2019-09-23 DIAGNOSIS — L570 Actinic keratosis: Secondary | ICD-10-CM | POA: Diagnosis not present

## 2019-09-25 DIAGNOSIS — F17290 Nicotine dependence, other tobacco product, uncomplicated: Secondary | ICD-10-CM | POA: Diagnosis not present

## 2019-09-25 DIAGNOSIS — I2510 Atherosclerotic heart disease of native coronary artery without angina pectoris: Secondary | ICD-10-CM | POA: Diagnosis not present

## 2019-09-25 DIAGNOSIS — I10 Essential (primary) hypertension: Secondary | ICD-10-CM | POA: Diagnosis not present

## 2019-09-25 DIAGNOSIS — E119 Type 2 diabetes mellitus without complications: Secondary | ICD-10-CM | POA: Diagnosis not present

## 2019-09-25 DIAGNOSIS — E785 Hyperlipidemia, unspecified: Secondary | ICD-10-CM | POA: Diagnosis not present

## 2019-09-25 DIAGNOSIS — I480 Paroxysmal atrial fibrillation: Secondary | ICD-10-CM | POA: Diagnosis not present

## 2019-10-13 DIAGNOSIS — H40153 Residual stage of open-angle glaucoma, bilateral: Secondary | ICD-10-CM | POA: Diagnosis not present

## 2019-10-14 DIAGNOSIS — M5136 Other intervertebral disc degeneration, lumbar region: Secondary | ICD-10-CM | POA: Diagnosis not present

## 2019-10-14 DIAGNOSIS — M5416 Radiculopathy, lumbar region: Secondary | ICD-10-CM | POA: Diagnosis not present

## 2019-10-14 DIAGNOSIS — M7062 Trochanteric bursitis, left hip: Secondary | ICD-10-CM | POA: Diagnosis not present

## 2019-10-14 DIAGNOSIS — M48062 Spinal stenosis, lumbar region with neurogenic claudication: Secondary | ICD-10-CM | POA: Diagnosis not present

## 2019-12-25 DIAGNOSIS — I2510 Atherosclerotic heart disease of native coronary artery without angina pectoris: Secondary | ICD-10-CM | POA: Diagnosis not present

## 2019-12-25 DIAGNOSIS — F17290 Nicotine dependence, other tobacco product, uncomplicated: Secondary | ICD-10-CM | POA: Diagnosis not present

## 2019-12-25 DIAGNOSIS — E119 Type 2 diabetes mellitus without complications: Secondary | ICD-10-CM | POA: Diagnosis not present

## 2019-12-25 DIAGNOSIS — I10 Essential (primary) hypertension: Secondary | ICD-10-CM | POA: Diagnosis not present

## 2019-12-25 DIAGNOSIS — E785 Hyperlipidemia, unspecified: Secondary | ICD-10-CM | POA: Diagnosis not present

## 2019-12-25 DIAGNOSIS — M1990 Unspecified osteoarthritis, unspecified site: Secondary | ICD-10-CM | POA: Diagnosis not present

## 2019-12-25 DIAGNOSIS — I480 Paroxysmal atrial fibrillation: Secondary | ICD-10-CM | POA: Diagnosis not present

## 2020-01-14 DIAGNOSIS — H6063 Unspecified chronic otitis externa, bilateral: Secondary | ICD-10-CM | POA: Diagnosis not present

## 2020-01-14 DIAGNOSIS — J383 Other diseases of vocal cords: Secondary | ICD-10-CM | POA: Diagnosis not present

## 2020-01-14 DIAGNOSIS — F17200 Nicotine dependence, unspecified, uncomplicated: Secondary | ICD-10-CM | POA: Diagnosis not present

## 2020-01-18 DIAGNOSIS — M48062 Spinal stenosis, lumbar region with neurogenic claudication: Secondary | ICD-10-CM | POA: Diagnosis not present

## 2020-01-18 DIAGNOSIS — M5416 Radiculopathy, lumbar region: Secondary | ICD-10-CM | POA: Diagnosis not present

## 2020-01-18 DIAGNOSIS — M7062 Trochanteric bursitis, left hip: Secondary | ICD-10-CM | POA: Diagnosis not present

## 2020-01-18 DIAGNOSIS — M5136 Other intervertebral disc degeneration, lumbar region: Secondary | ICD-10-CM | POA: Diagnosis not present

## 2020-01-25 ENCOUNTER — Inpatient Hospital Stay: Payer: PPO

## 2020-01-25 ENCOUNTER — Inpatient Hospital Stay: Payer: PPO | Attending: Hematology | Admitting: Hematology

## 2020-01-29 ENCOUNTER — Other Ambulatory Visit: Payer: Self-pay | Admitting: Family

## 2020-01-29 DIAGNOSIS — F411 Generalized anxiety disorder: Secondary | ICD-10-CM

## 2020-02-01 DIAGNOSIS — H40153 Residual stage of open-angle glaucoma, bilateral: Secondary | ICD-10-CM | POA: Diagnosis not present

## 2020-02-22 DIAGNOSIS — M48062 Spinal stenosis, lumbar region with neurogenic claudication: Secondary | ICD-10-CM | POA: Diagnosis not present

## 2020-02-22 DIAGNOSIS — M5136 Other intervertebral disc degeneration, lumbar region: Secondary | ICD-10-CM | POA: Diagnosis not present

## 2020-02-22 DIAGNOSIS — M5416 Radiculopathy, lumbar region: Secondary | ICD-10-CM | POA: Diagnosis not present

## 2020-04-01 DIAGNOSIS — R7303 Prediabetes: Secondary | ICD-10-CM | POA: Diagnosis not present

## 2020-04-01 DIAGNOSIS — I10 Essential (primary) hypertension: Secondary | ICD-10-CM | POA: Diagnosis not present

## 2020-04-01 DIAGNOSIS — E119 Type 2 diabetes mellitus without complications: Secondary | ICD-10-CM | POA: Diagnosis not present

## 2020-04-01 DIAGNOSIS — I2510 Atherosclerotic heart disease of native coronary artery without angina pectoris: Secondary | ICD-10-CM | POA: Diagnosis not present

## 2020-04-01 DIAGNOSIS — I480 Paroxysmal atrial fibrillation: Secondary | ICD-10-CM | POA: Diagnosis not present

## 2020-04-01 DIAGNOSIS — E785 Hyperlipidemia, unspecified: Secondary | ICD-10-CM | POA: Diagnosis not present

## 2020-04-01 DIAGNOSIS — F17290 Nicotine dependence, other tobacco product, uncomplicated: Secondary | ICD-10-CM | POA: Diagnosis not present

## 2020-04-07 DIAGNOSIS — M542 Cervicalgia: Secondary | ICD-10-CM | POA: Diagnosis not present

## 2020-04-07 DIAGNOSIS — Z981 Arthrodesis status: Secondary | ICD-10-CM | POA: Diagnosis not present

## 2020-04-07 DIAGNOSIS — M5416 Radiculopathy, lumbar region: Secondary | ICD-10-CM | POA: Diagnosis not present

## 2020-05-05 DIAGNOSIS — M5416 Radiculopathy, lumbar region: Secondary | ICD-10-CM | POA: Diagnosis not present

## 2020-05-05 DIAGNOSIS — M545 Low back pain, unspecified: Secondary | ICD-10-CM | POA: Diagnosis not present

## 2020-05-05 DIAGNOSIS — M542 Cervicalgia: Secondary | ICD-10-CM | POA: Diagnosis not present

## 2020-05-12 DIAGNOSIS — R7303 Prediabetes: Secondary | ICD-10-CM | POA: Diagnosis not present

## 2020-05-12 DIAGNOSIS — I10 Essential (primary) hypertension: Secondary | ICD-10-CM | POA: Diagnosis not present

## 2020-05-12 DIAGNOSIS — M542 Cervicalgia: Secondary | ICD-10-CM | POA: Diagnosis not present

## 2020-05-12 DIAGNOSIS — I480 Paroxysmal atrial fibrillation: Secondary | ICD-10-CM | POA: Diagnosis not present

## 2020-05-12 DIAGNOSIS — E785 Hyperlipidemia, unspecified: Secondary | ICD-10-CM | POA: Diagnosis not present

## 2020-06-01 DIAGNOSIS — H40153 Residual stage of open-angle glaucoma, bilateral: Secondary | ICD-10-CM | POA: Diagnosis not present

## 2020-06-05 NOTE — Progress Notes (Signed)
Cardiology Office Note  Date:  06/06/2020   ID:  YULENI BURICH, DOB Nov 28, 1940, MRN [id number]  PCP:  Burnard Hawthorne, FNP   Chief Complaint  Patient presents with  . NEW patient    Patient would like to establish cardiac care at a closer location to her home.    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 Stable mild retroperitoneal adenopathy, compatible with treated lymphoma. No new or progressive adenopathy. No new sites of disease. Normal size spleen. 2. Three-vessel coronary atherosclerosis. 3.  Aortic Atherosclerosis   Stress test 2015 Small relatively matched area of attenuation involving the  inferior wall of the left ventricle without associated regional wall  motion abnormality. No definite scintigraphic evidence of prior  infarction or pharmacologically induced ischemia.  2. Normal wall motion. Ejection fraction - 54%. (Previous ejection  fraction - 68%).  In 2020 Total chol 118, LDL 58 A1C 6.0  EKG personally reviewed by myself on todays visit nsr rate 71 old inferior MI  PMH:   has a past medical history of A-fib ([place]), Arthritis, Chronic back pain, COPD (chronic obstructive pulmonary disease) ([place]), Coronary artery disease, Glaucoma, Hearing aid worn, High cholesterol, Hypertension, Lymphadenopathy (01/25/2016), Myocardial infarction ([place]), Non Hodgkin's lymphoma ([place]), Wears dentures, and Wears glasses.  PSH:    Past Surgical History:  Procedure  Laterality Date  . CARDIAC CATHETERIZATION    . CATARACT EXTRACTION W/ INTRAOCULAR LENS IMPLANT    . CHOLECYSTECTOMY    . COLONOSCOPY W/ BIOPSIES AND POLYPECTOMY    . CORONARY STENT PLACEMENT    . INGUINAL LYMPH NODE BIOPSY Right 01/25/2016   Procedure: EXCISION DEEP RIGHT INGUINAL LYMPH NODE BIOPSY WITH ULTRA SOUND;  Surgeon: Fanny Skates, MD;  Location: [place];  Service: General;  Laterality: Right;    Current Outpatient Medications  Medication Sig Dispense Refill  . ALPRAZolam (XANAX) 0.25 MG tablet Take 0.25 mg by mouth daily as needed for anxiety.     Marland Kitchen amiodarone (PACERONE) 200 MG tablet Take 100 mg by mouth every morning.    Marland Kitchen amLODipine (NORVASC) 5 MG tablet Take 2.5 mg by mouth daily.    Marland Kitchen atorvastatin (LIPITOR) 40 MG tablet Take 40 mg by mouth every morning.     Marland Kitchen ELIQUIS 5 MG TABS tablet Take 5 mg by mouth 2 (two) times daily.    . fluticasone (FLONASE) 50 MCG/ACT nasal spray Place 2 sprays into both nostrils daily. 16 g 3  . gabapentin (NEURONTIN) 300 MG capsule Take 300 mg by mouth 2 (two) times daily as needed (neuropathy).     . metoprolol succinate (TOPROL-XL) 50 MG 24 hr tablet Take 50 mg by mouth daily.    . nitroGLYCERIN (NITROSTAT) 0.4 MG SL tablet Place 0.4 mg under the tongue every 5 (five) minutes x 3 doses as needed for chest pain.     . pantoprazole (PROTONIX) 40 MG tablet TAKE 1 TABLET BY MOUTH EVERY DAY 90 tablet 0  . sertraline (ZOLOFT) 50  MG tablet TAKE 1 TABLET BY MOUTH EVERYDAY AT BEDTIME 90 tablet 3  . timolol (TIMOPTIC) 0.5 % ophthalmic solution Place 1 drop into both eyes 2 (two) times daily. Instill 1 drop in each eye in the morning and one drop in each eye at night     No current facility-administered medications for this visit.     Allergies:   Hydrocodone and Oxycodone   Social History:  The patient  reports that she has been smoking cigarettes. She has been smoking about 0.50 packs per day. She has never used smokeless tobacco. She reports current  alcohol use. She reports that she does not use drugs.   Family History:   family history includes Heart disease in her father and another family member; Hypertension in her mother; Stroke in her mother.    Review of Systems: Review of Systems  Constitutional: Negative.   HENT: Negative.   Respiratory: Negative.   Cardiovascular: Negative.   Gastrointestinal: Negative.   Musculoskeletal: Positive for back pain and joint pain.  Neurological: Negative.   Psychiatric/Behavioral: Negative.   All other systems reviewed and are negative.    PHYSICAL EXAM: VS:  BP 132/60 (BP Location: Right Arm, Patient Position: Sitting, Cuff Size: Large)   Pulse (!) 59   Ht 5\' 9"  (1.753 m)   Wt 217 lb (98.4 kg)   SpO2 95%   BMI 32.05 kg/m  , BMI Body mass index is 32.05 kg/m. GEN: Well nourished, well developed, in no acute distress HEENT: normal Neck: no JVD, carotid bruits, or masses Cardiac: RRR; no murmurs, rubs, or gallops,no edema  Respiratory:  clear to auscultation bilaterally, normal work of breathing GI: soft, nontender, nondistended, + BS MS: no deformity or atrophy Skin: warm and dry, no rash Neuro:  Strength and sensation are intact Psych: euthymic mood, full affect    Recent Labs: 07/24/2019: ALT 12; BUN 9; Creatinine 0.81; Hemoglobin 13.0; Platelets 225; Potassium 4.3; Sodium 144    Lipid Panel Lab Results  Component Value Date   CHOL 118 06/03/2018   HDL 37.20 (L) 06/03/2018   LDLCALC 58 06/03/2018   TRIG 112.0 06/03/2018      Wt Readings from Last 3 Encounters:  06/06/20 217 lb (98.4 kg)  07/24/19 216 lb 9.6 oz (98.2 kg)  01/01/19 211 lb 9.6 oz (96 kg)       ASSESSMENT AND PLAN:  Problem List Items Addressed This Visit      Cardiology Problems   HTN (hypertension)   Intermittent atrial fibrillation (HCC)     Other   Follicular lymphoma of lymph nodes of multiple regions [facility])    Other Visit Diagnoses    Coronary artery disease of native artery of  native heart with stable angina pectoris ([place])    -  Primary     CAD with stable angina Currently with no symptoms of angina. No further workup at this time. Continue current medication regimen. Still smoking, discussed Records reviewed  Hyperlipidemia Cholesterol is at goal on the current lipid regimen. No changes to the medications were made.  HTN Blood pressure is well controlled on today's visit. No changes made to the medications.  Atrial fib maintaining NSR, recordeds reviewed Amiodarone, metoprolol    Total encounter time more than 60 minutes  Greater than 50% was spent in counseling and coordination of care with the patient    Signed, Esmond Plants, M.D., Ph.D. [place], [place]

## 2020-06-06 ENCOUNTER — Encounter: Payer: Self-pay | Admitting: Cardiovascular Disease

## 2020-06-06 ENCOUNTER — Ambulatory Visit: Payer: PPO | Admitting: Cardiovascular Disease

## 2020-06-06 ENCOUNTER — Other Ambulatory Visit: Payer: Self-pay

## 2020-06-06 VITALS — BP 132/60 | HR 59 | Ht 69.0 in | Wt 217.0 lb

## 2020-06-06 DIAGNOSIS — I25118 Atherosclerotic heart disease of native coronary artery with other forms of angina pectoris: Secondary | ICD-10-CM | POA: Diagnosis not present

## 2020-06-06 DIAGNOSIS — I10 Essential (primary) hypertension: Secondary | ICD-10-CM

## 2020-06-06 DIAGNOSIS — C8298 Follicular lymphoma, unspecified, lymph nodes of multiple sites: Secondary | ICD-10-CM | POA: Diagnosis not present

## 2020-06-06 DIAGNOSIS — I480 Paroxysmal atrial fibrillation: Secondary | ICD-10-CM

## 2020-06-06 MED ORDER — AMLODIPINE BESYLATE 5 MG PO TABS: 2.5000 mg | ORAL_TABLET | Freq: Every day | ORAL | 0 refills | Status: DC

## 2020-06-06 MED ORDER — ATORVASTATIN CALCIUM 40 MG PO TABS: 40.0000 mg | ORAL_TABLET | ORAL | 0 refills | Status: DC

## 2020-06-06 MED ORDER — ELIQUIS 5 MG PO TABS: 5.0000 mg | ORAL_TABLET | Freq: Two times a day (BID) | ORAL | 0 refills | Status: DC

## 2020-06-06 MED ORDER — AMIODARONE HCL 200 MG PO TABS: 100.0000 mg | ORAL_TABLET | Freq: Every morning | ORAL | 0 refills | Status: DC

## 2020-06-06 MED ORDER — METOPROLOL SUCCINATE ER 50 MG PO TB24: 50.0000 mg | ORAL_TABLET | Freq: Every day | ORAL | 0 refills | Status: DC

## 2020-06-06 MED ORDER — NITROGLYCERIN 0.4 MG SL SUBL: 0.4000 mg | SUBLINGUAL_TABLET | SUBLINGUAL | 0 refills | Status: DC | PRN

## 2020-06-06 NOTE — Patient Instructions (Signed)
Medication Instructions:  No changes  If you need a refill on your cardiac medications before your next appointment, please call your pharmacy.    Lab work: No new labs needed   If you have labs (blood work) drawn today and your tests are completely normal, you will receive your results only by: . MyChart Message (if you have MyChart) OR . A paper copy in the mail If you have any lab test that is abnormal or we need to change your treatment, we will call you to review the results.   Testing/Procedures: No new testing needed   Follow-Up: At [facility], you and your health needs are our priority.  As part of our continuing mission to provide you with exceptional heart care, we have created designated Provider Care Teams.  These Care Teams include your primary Cardiologist (physician) and Advanced Practice Providers (APPs -  Physician Assistants and Nurse Practitioners) who all work together to provide you with the care you need, when you need it.  . You will need a follow up appointment in 6 months  . Providers on your designated Care Team:   . Christopher Berge, NP . Ryan Dunn, PA-C . Jacquelyn Visser, PA-C  Any Other Special Instructions Will Be Listed Below (If Applicable).  COVID-19 Vaccine Information can be found at: [email or web address] For questions related to vaccine distribution or appointments, please email [email or web address] or call [phone].     

## 2020-06-17 DIAGNOSIS — M542 Cervicalgia: Secondary | ICD-10-CM | POA: Diagnosis not present

## 2020-06-20 DIAGNOSIS — M542 Cervicalgia: Secondary | ICD-10-CM | POA: Diagnosis not present

## 2020-06-22 ENCOUNTER — Ambulatory Visit (INDEPENDENT_AMBULATORY_CARE_PROVIDER_SITE_OTHER): Payer: PPO | Admitting: Family

## 2020-06-22 ENCOUNTER — Encounter: Payer: Self-pay | Admitting: Family

## 2020-06-22 ENCOUNTER — Other Ambulatory Visit: Payer: Self-pay

## 2020-06-22 VITALS — BP 122/54 | HR 62 | Temp 99.0°F | Ht 69.0 in | Wt 215.8 lb

## 2020-06-22 DIAGNOSIS — I10 Essential (primary) hypertension: Secondary | ICD-10-CM

## 2020-06-22 DIAGNOSIS — F325 Major depressive disorder, single episode, in full remission: Secondary | ICD-10-CM

## 2020-06-22 DIAGNOSIS — Z0000 Encounter for general adult medical examination without abnormal findings: Secondary | ICD-10-CM | POA: Diagnosis not present

## 2020-06-22 DIAGNOSIS — I480 Paroxysmal atrial fibrillation: Secondary | ICD-10-CM

## 2020-06-22 DIAGNOSIS — E785 Hyperlipidemia, unspecified: Secondary | ICD-10-CM | POA: Diagnosis not present

## 2020-06-22 MED ORDER — DULOXETINE HCL 30 MG PO CPEP: 30.0000 mg | ORAL_CAPSULE | Freq: Every day | ORAL | 3 refills | Status: DC

## 2020-06-22 NOTE — Progress Notes (Signed)
Subjective:    Patient ID: Samantha [name], female    DOB: 1940-06-14, 80 y.o.   MRN: [id number]  CC: Samantha [name] is a 80 y.o. female who presents today for physical exam and follow up.    HPI: Complains depression, there are some that days are harder than other. No stress at home.  She stopped zoloft one week ago in anticipation of this appointment.  She is interested in cymbalta.  No si/hi.  She complains of neck, hip and leg pain for 2-3 years, worsening. She 'aches' all over. No particular joint. No joint swelling, erythema. She takes gabapentin without relief. Occasional bilateral numbnes in fingertips. She is following with Dr Deetta Perla and reports she has recent imaging with him. He has referred her to PT.   No HA, vision changes.   HTN- compliant with amlodipine 5mg   Intermittent atrial fib- follows with Dr Rockey Situ, compliant with eliquis 5mg  bid,amiodarone 200mg  qam, toprol 50mg    HLD- compliant with lipitor   H/o lymphoma- continues to follow with Dr Irene Limbo. Last visit 07/2019  Colorectal Cancer Screening: No constipation, rectal bleeding.   Breast Cancer Screening: Mammogram due; she declines screening.  Cervical Cancer Screening: no vaginal pain, vaginal bleeding. Bone Health screening/DEXA for 65+: due Lung Cancer Screening: meets criteria; she declines screening.         Tetanus - due        Pneumococcal - Complete Hepatitis C screening - Candidate for, consents  Labs: Screening labs today. Exercise: No regular exercise.  She works in yard, Surveyor, minerals in garden.  Alcohol use:  occassional Smoking/tobacco use: smoker.     HISTORY:  Past Medical History:  Diagnosis Date  . A-fib ([place])   . Arthritis   . Chronic back pain   . COPD (chronic obstructive pulmonary disease) ([place])   . Coronary artery disease   . Glaucoma   . Hearing aid worn   . High cholesterol   . Hypertension   . Lymphadenopathy 01/25/2016  . Myocardial infarction ([place])    2000  . Non  Hodgkin's lymphoma ([place])   . Wears dentures    " Top plate"  . Wears glasses     Past Surgical History:  Procedure Laterality Date  . CARDIAC CATHETERIZATION    . CATARACT EXTRACTION W/ INTRAOCULAR LENS IMPLANT    . CHOLECYSTECTOMY    . COLONOSCOPY W/ BIOPSIES AND POLYPECTOMY    . CORONARY STENT PLACEMENT    . INGUINAL LYMPH NODE BIOPSY Right 01/25/2016   Procedure: EXCISION DEEP RIGHT INGUINAL LYMPH NODE BIOPSY WITH ULTRA SOUND;  Surgeon: Samantha Skates, MD;  Location: [place];  Service: General;  Laterality: Right;   Family History  Problem Relation Age of Onset  . Hypertension Mother   . Stroke Mother   . Heart disease Father   . Heart disease Other       ALLERGIES: Hydrocodone and Oxycodone  Current Outpatient Medications on File Prior to Visit  Medication Sig Dispense Refill  . ALPRAZolam (XANAX) 0.25 MG tablet Take 0.25 mg by mouth daily as needed for anxiety.     Marland Kitchen amiodarone (PACERONE) 200 MG tablet Take 0.5 tablets (100 mg total) by mouth every morning. 90 tablet 0  . amLODipine (NORVASC) 5 MG tablet Take 0.5 tablets (2.5 mg total) by mouth daily. 30 tablet 0  . atorvastatin (LIPITOR) 40 MG tablet Take 1 tablet (40 mg total) by mouth every morning. 30 tablet 0  . ELIQUIS 5 MG TABS tablet  Take 1 tablet (5 mg total) by mouth 2 (two) times daily. 60 tablet 0  . fluticasone (FLONASE) 50 MCG/ACT nasal spray Place 2 sprays into both nostrils daily. 16 g 3  . metoprolol succinate (TOPROL-XL) 50 MG 24 hr tablet Take 1 tablet (50 mg total) by mouth daily. 30 tablet 0  . nitroGLYCERIN (NITROSTAT) 0.4 MG SL tablet Place 1 tablet (0.4 mg total) under the tongue every 5 (five) minutes x 3 doses as needed for chest pain. 25 tablet 0  . timolol (TIMOPTIC) 0.5 % ophthalmic solution Place 1 drop into both eyes 2 (two) times daily. Instill 1 drop in each eye in the morning and one drop in each eye at night     No current facility-administered medications on file prior to visit.    Social  History   Tobacco Use  . Smoking status: Current Every Day Smoker    Packs/day: 0.50    Types: Cigarettes  . Smokeless tobacco: Never Used  Vaping Use  . Vaping Use: Never used  Substance Use Topics  . Alcohol use: Yes    Comment: social  . Drug use: No    Review of Systems  Constitutional: Negative for chills, fever and unexpected weight change.  HENT: Negative for congestion.   Respiratory: Negative for cough.   Cardiovascular: Negative for chest pain, palpitations and leg swelling.  Gastrointestinal: Negative for abdominal pain, nausea and vomiting.  Genitourinary: Negative for vaginal pain.  Musculoskeletal: Positive for arthralgias and back pain. Negative for myalgias.  Skin: Negative for rash.  Neurological: Positive for numbness (distal hands). Negative for headaches.  Hematological: Negative for adenopathy.  Psychiatric/Behavioral: Positive for sleep disturbance. Negative for confusion and suicidal ideas. The patient is not nervous/anxious.       Objective:    BP (!) 122/54   Pulse 62   Temp 99 F (37.2 C)   Ht 5\' 9"  (1.753 m)   Wt 215 lb 12.8 oz (97.9 kg)   SpO2 94%   BMI 31.87 kg/m   BP Readings from Last 3 Encounters:  06/22/20 (!) 122/54  06/06/20 132/60  07/24/19 (!) 124/43   Wt Readings from Last 3 Encounters:  06/22/20 215 lb 12.8 oz (97.9 kg)  06/06/20 217 lb (98.4 kg)  07/24/19 216 lb 9.6 oz (98.2 kg)    Physical Exam Vitals reviewed.  Constitutional:      Appearance: She is well-developed.  Eyes:     Conjunctiva/sclera: Conjunctivae normal.  Neck:     Thyroid: No thyroid mass or thyromegaly.  Cardiovascular:     Rate and Rhythm: Normal rate and regular rhythm.     Pulses: Normal pulses.     Heart sounds: Normal heart sounds.  Pulmonary:     Effort: Pulmonary effort is normal.     Breath sounds: Normal breath sounds. No wheezing, rhonchi or rales.  Chest:  Breasts: Breasts are symmetrical.     Right: No inverted nipple, mass,  nipple discharge, skin change or tenderness.     Left: No inverted nipple, mass, nipple discharge, skin change or tenderness.    Lymphadenopathy:     Head:     Right side of head: No submental, submandibular, tonsillar, preauricular, posterior auricular or occipital adenopathy.     Left side of head: No submental, submandibular, tonsillar, preauricular, posterior auricular or occipital adenopathy.     Cervical: No cervical adenopathy.     Right cervical: No superficial, deep or posterior cervical adenopathy.    Left cervical: No  superficial, deep or posterior cervical adenopathy.  Skin:    General: Skin is warm and dry.  Neurological:     Mental Status: She is alert.  Psychiatric:        Speech: Speech normal.        Behavior: Behavior normal.        Thought Content: Thought content normal.        Assessment & Plan:   Problem List Items Addressed This Visit      Cardiovascular and Mediastinum   HTN (hypertension)    Well controlled. Compliant with toprol 50mg        Intermittent atrial fibrillation (HCC)    Symptomatically stable. Continue eliquis 5mg  bid,amiodarone 200mg  qam.        Other   Depression, major, single episode, complete remission (HCC)    Suboptimal control. Start cymbalta 30mg  for diffuse arthralgia as well. Close follow up.       Relevant Medications   DULoxetine (CYMBALTA) 30 MG capsule   HLD (hyperlipidemia)    Presume controlled. Continue lipitor 40mg       Routine physical examination - Primary    CBE performed. She declines mammogram. She no longer screens for colon cancer . Deferred pelvic exam in the absence of complaints. Strongly encouraged CT lung cancer screening program, she declines.       Relevant Orders   TSH   CBC with Differential/Platelet   Comprehensive metabolic panel   Hemoglobin A1c   Hepatitis C antibody   Lipid panel   VITAMIN D 25 Hydroxy (Vit-D Deficiency, Fractures)   B12 and Folate Panel       I have  discontinued Van Clines. Espinal's gabapentin, pantoprazole, and sertraline. I am also having her start on DULoxetine. Additionally, I am having her maintain her ALPRAZolam, timolol, fluticasone, atorvastatin, metoprolol succinate, nitroGLYCERIN, amiodarone, amLODipine, and Eliquis.   Meds ordered this encounter  Medications  . DULoxetine (CYMBALTA) 30 MG capsule    Sig: Take 1 capsule (30 mg total) by mouth daily.    Dispense:  90 capsule    Refill:  3    Order Specific Question:   Supervising Provider    Answer:   Crecencio Mc [[id number]]    Return precautions given.   Risks, benefits, and alternatives of the medications and treatment plan prescribed today were discussed, and patient expressed understanding.   Education regarding symptom management and diagnosis given to patient on AVS.   Continue to follow with Burnard Hawthorne, FNP for routine health maintenance.   Velora Mediate and I agreed with plan.   Mable Paris, FNP

## 2020-06-22 NOTE — Assessment & Plan Note (Signed)
Symptomatically stable. Continue eliquis 5mg  bid,amiodarone 200mg  qam.

## 2020-06-22 NOTE — Assessment & Plan Note (Signed)
CBE performed. She declines mammogram. She no longer screens for colon cancer . Deferred pelvic exam in the absence of complaints. Strongly encouraged CT lung cancer screening program, she declines.

## 2020-06-22 NOTE — Assessment & Plan Note (Signed)
Suboptimal control. Start cymbalta 30mg  for diffuse arthralgia as well. Close follow up.

## 2020-06-22 NOTE — Assessment & Plan Note (Addendum)
Presume controlled. Continue lipitor 40mg 

## 2020-06-22 NOTE — Patient Instructions (Signed)
Please continue to follow with Dr Rockey Situ, Dr Irene Limbo  [facility] Maintenance for Postmenopausal Women Menopause is a normal process in which your ability to get pregnant comes to an end. This process happens slowly over many months or years, usually between the ages of 14 and 34. Menopause is complete when you have missed your menstrual periods for 12 months. It is important to talk with your health care provider about some of the most common conditions that affect women after menopause (postmenopausal women). These include heart disease, cancer, and bone loss (osteoporosis). Adopting a healthy lifestyle and getting preventive care can help to promote your health and wellness. The actions you take can also lower your chances of developing some of these common conditions. What should I know about menopause? During menopause, you may get a number of symptoms, such as:  Hot flashes. These can be moderate or severe.  Night sweats.  Decrease in sex drive.  Mood swings.  Headaches.  Tiredness.  Irritability.  Memory problems.  Insomnia. Choosing to treat or not to treat these symptoms is a decision that you make with your health care provider. Do I need hormone replacement therapy?  Hormone replacement therapy is effective in treating symptoms that are caused by menopause, such as hot flashes and night sweats.  Hormone replacement carries certain risks, especially as you become older. If you are thinking about using estrogen or estrogen with progestin, discuss the benefits and risks with your health care provider. What is my risk for heart disease and stroke? The risk of heart disease, heart attack, and stroke increases as you age. One of the causes may be a change in the body's hormones during menopause. This can affect how your body uses dietary fats, triglycerides, and cholesterol. Heart attack and stroke are medical emergencies. There are many things that you can do to help  prevent heart disease and stroke. Watch your blood pressure  High blood pressure causes heart disease and increases the risk of stroke. This is more likely to develop in people who have high blood pressure readings, are of African descent, or are overweight.  Have your blood pressure checked: ? Every 3-5 years if you are 82-52 years of age. ? Every year if you are 65 years old or older. Eat a healthy diet  Eat a diet that includes plenty of vegetables, fruits, low-fat dairy products, and lean protein.  Do not eat a lot of foods that are high in solid fats, added sugars, or sodium.   Get regular exercise Get regular exercise. This is one of the most important things you can do for your health. Most adults should:  Try to exercise for at least 150 minutes each week. The exercise should increase your heart rate and make you sweat (moderate-intensity exercise).  Try to do strengthening exercises at least twice each week. Do these in addition to the moderate-intensity exercise.  Spend less time sitting. Even light physical activity can be beneficial. Other tips  Work with your health care provider to achieve or maintain a healthy weight.  Do not use any products that contain nicotine or tobacco, such as cigarettes, e-cigarettes, and chewing tobacco. If you need help quitting, ask your health care provider.  Know your numbers. Ask your health care provider to check your cholesterol and your blood sugar (glucose). Continue to have your blood tested as directed by your health care provider. Do I need screening for cancer? Depending on your health history and family  history, you may need to have cancer screening at different stages of your life. This may include screening for:  Breast cancer.  Cervical cancer.  Lung cancer.  Colorectal cancer. What is my risk for osteoporosis? After menopause, you may be at increased risk for osteoporosis. Osteoporosis is a condition in which bone  destruction happens more quickly than new bone creation. To help prevent osteoporosis or the bone fractures that can happen because of osteoporosis, you may take the following actions:  If you are 26-71 years old, get at least 1,000 mg of calcium and at least 600 mg of vitamin D per day.  If you are older than age 51 but younger than age 26, get at least 1,200 mg of calcium and at least 600 mg of vitamin D per day.  If you are older than age 60, get at least 1,200 mg of calcium and at least 800 mg of vitamin D per day. Smoking and drinking excessive alcohol increase the risk of osteoporosis. Eat foods that are rich in calcium and vitamin D, and do weight-bearing exercises several times each week as directed by your health care provider. How does menopause affect my mental health? Depression may occur at any age, but it is more common as you become older. Common symptoms of depression include:  Low or sad mood.  Changes in sleep patterns.  Changes in appetite or eating patterns.  Feeling an overall lack of motivation or enjoyment of activities that you previously enjoyed.  Frequent crying spells. Talk with your health care provider if you think that you are experiencing depression. General instructions See your health care provider for regular wellness exams and vaccines. This may include:  Scheduling regular health, dental, and eye exams.  Getting and maintaining your vaccines. These include: ? Influenza vaccine. Get this vaccine each year before the flu season begins. ? Pneumonia vaccine. ? Shingles vaccine. ? Tetanus, diphtheria, and pertussis (Tdap) booster vaccine. Your health care provider may also recommend other immunizations. Tell your health care provider if you have ever been abused or do not feel safe at home. Summary  Menopause is a normal process in which your ability to get pregnant comes to an end.  This condition causes hot flashes, night sweats, decreased  interest in sex, mood swings, headaches, or lack of sleep.  Treatment for this condition may include hormone replacement therapy.  Take actions to keep yourself healthy, including exercising regularly, eating a healthy diet, watching your weight, and checking your blood pressure and blood sugar levels.  Get screened for cancer and depression. Make sure that you are up to date with all your vaccines. This information is not intended to replace advice given to you by your health care provider. Make sure you discuss any questions you have with your health care provider. Document Revised: 02/26/2018 Document Reviewed: 02/26/2018 Elsevier Patient Education  2021 [employer].

## 2020-06-22 NOTE — Assessment & Plan Note (Addendum)
Well controlled. Compliant with toprol 50mg 

## 2020-06-23 DIAGNOSIS — M542 Cervicalgia: Secondary | ICD-10-CM | POA: Diagnosis not present

## 2020-06-23 LAB — B12 AND FOLATE PANEL
Folate: 8.6 ng/mL (ref >5.9)
Vitamin B-12: 166 pg/mL — ABNORMAL LOW (ref 211–911)

## 2020-06-23 LAB — CBC WITH DIFFERENTIAL/PLATELET
Basophils Absolute: 0.1 10*3/uL (ref 0.0–0.1)
Basophils Relative: 0.7 % (ref 0.0–3.0)
Eosinophils Absolute: 0.2 10*3/uL (ref 0.0–0.7)
Eosinophils Relative: 1.7 % (ref 0.0–5.0)
HCT: 38.2 % (ref 36.0–46.0)
Hemoglobin: 12.7 g/dL (ref 12.0–15.0)
Lymphocytes Relative: 21.6 % (ref 12.0–46.0)
Lymphs Abs: 2.1 10*3/uL (ref 0.7–4.0)
MCHC: 33.2 g/dL (ref 30.0–36.0)
MCV: 91.2 fl (ref 78.0–100.0)
Monocytes Absolute: 0 10*3/uL — ABNORMAL LOW (ref 0.1–1.0)
Monocytes Relative: 0.4 % — ABNORMAL LOW (ref 3.0–12.0)
Neutro Abs: 7.2 10*3/uL (ref 1.4–7.7)
Neutrophils Relative %: 75.6 % (ref 43.0–77.0)
Platelets: 215 10*3/uL (ref 150.0–400.0)
RBC: 4.19 Mil/uL (ref 3.87–5.11)
RDW: 14.2 % (ref 11.5–15.5)
WBC: 9.5 10*3/uL (ref 4.0–10.5)

## 2020-06-23 LAB — COMPREHENSIVE METABOLIC PANEL
ALT: 12 U/L (ref 0–35)
AST: 17 U/L (ref 0–37)
Albumin: 3.9 g/dL (ref 3.5–5.2)
Alkaline Phosphatase: 59 U/L (ref 39–117)
BUN: 14 mg/dL (ref 6–23)
CO2: 30 mEq/L (ref 19–32)
Calcium: 9.2 mg/dL (ref 8.4–10.5)
Chloride: 104 mEq/L (ref 96–112)
Creatinine, Ser: 0.87 mg/dL (ref 0.40–1.20)
GFR: 63.79 mL/min (ref >60.00)
Glucose, Bld: 94 mg/dL (ref 70–99)
Potassium: 4.4 mEq/L (ref 3.5–5.1)
Sodium: 142 mEq/L (ref 135–145)
Total Bilirubin: 0.5 mg/dL (ref 0.2–1.2)
Total Protein: 6.4 g/dL (ref 6.0–8.3)

## 2020-06-23 LAB — LIPID PANEL
Cholesterol: 116 mg/dL (ref 0–200)
HDL: 40.4 mg/dL (ref >39.00)
LDL Cholesterol: 50 mg/dL (ref 0–99)
NonHDL: 75.69
Total CHOL/HDL Ratio: 3
Triglycerides: 129 mg/dL (ref 0.0–149.0)
VLDL: 25.8 mg/dL (ref 0.0–40.0)

## 2020-06-23 LAB — VITAMIN D 25 HYDROXY (VIT D DEFICIENCY, FRACTURES): VITD: 14.68 ng/mL — ABNORMAL LOW (ref 30.00–100.00)

## 2020-06-23 LAB — HEPATITIS C ANTIBODY
Hepatitis C Ab: NONREACTIVE
SIGNAL TO CUT-OFF: 0 (ref <1.00)

## 2020-06-23 LAB — TSH: TSH: 2.88 u[IU]/mL (ref 0.35–4.50)

## 2020-06-23 LAB — HEMOGLOBIN A1C: Hgb A1c MFr Bld: 6.1 % (ref 4.6–6.5)

## 2020-06-27 ENCOUNTER — Other Ambulatory Visit: Payer: Self-pay | Admitting: Family

## 2020-06-27 DIAGNOSIS — R899 Unspecified abnormal finding in specimens from other organs, systems and tissues: Secondary | ICD-10-CM

## 2020-06-27 DIAGNOSIS — F330 Major depressive disorder, recurrent, mild: Secondary | ICD-10-CM | POA: Diagnosis not present

## 2020-06-27 DIAGNOSIS — M47812 Spondylosis without myelopathy or radiculopathy, cervical region: Secondary | ICD-10-CM | POA: Diagnosis not present

## 2020-06-27 DIAGNOSIS — G629 Polyneuropathy, unspecified: Secondary | ICD-10-CM | POA: Diagnosis not present

## 2020-06-27 DIAGNOSIS — M461 Sacroiliitis, not elsewhere classified: Secondary | ICD-10-CM | POA: Diagnosis not present

## 2020-06-27 DIAGNOSIS — J449 Chronic obstructive pulmonary disease, unspecified: Secondary | ICD-10-CM | POA: Diagnosis not present

## 2020-06-27 DIAGNOSIS — C8590 Non-Hodgkin lymphoma, unspecified, unspecified site: Secondary | ICD-10-CM | POA: Diagnosis not present

## 2020-06-27 DIAGNOSIS — D692 Other nonthrombocytopenic purpura: Secondary | ICD-10-CM | POA: Diagnosis not present

## 2020-06-27 DIAGNOSIS — I10 Essential (primary) hypertension: Secondary | ICD-10-CM | POA: Diagnosis not present

## 2020-06-27 DIAGNOSIS — I2510 Atherosclerotic heart disease of native coronary artery without angina pectoris: Secondary | ICD-10-CM | POA: Diagnosis not present

## 2020-06-27 DIAGNOSIS — I70203 Unspecified atherosclerosis of native arteries of extremities, bilateral legs: Secondary | ICD-10-CM | POA: Diagnosis not present

## 2020-06-27 DIAGNOSIS — I480 Paroxysmal atrial fibrillation: Secondary | ICD-10-CM | POA: Diagnosis not present

## 2020-06-27 DIAGNOSIS — D6869 Other thrombophilia: Secondary | ICD-10-CM | POA: Diagnosis not present

## 2020-06-28 DIAGNOSIS — M542 Cervicalgia: Secondary | ICD-10-CM | POA: Diagnosis not present

## 2020-07-04 ENCOUNTER — Encounter: Payer: PPO | Admitting: Family

## 2020-07-14 DIAGNOSIS — F17200 Nicotine dependence, unspecified, uncomplicated: Secondary | ICD-10-CM | POA: Diagnosis not present

## 2020-07-14 DIAGNOSIS — J383 Other diseases of vocal cords: Secondary | ICD-10-CM | POA: Diagnosis not present

## 2020-07-14 DIAGNOSIS — M5416 Radiculopathy, lumbar region: Secondary | ICD-10-CM | POA: Diagnosis not present

## 2020-07-14 DIAGNOSIS — R490 Dysphonia: Secondary | ICD-10-CM | POA: Diagnosis not present

## 2020-07-26 ENCOUNTER — Other Ambulatory Visit (INDEPENDENT_AMBULATORY_CARE_PROVIDER_SITE_OTHER): Payer: PPO

## 2020-07-26 ENCOUNTER — Other Ambulatory Visit: Payer: Self-pay

## 2020-07-26 DIAGNOSIS — R899 Unspecified abnormal finding in specimens from other organs, systems and tissues: Secondary | ICD-10-CM

## 2020-07-26 LAB — CBC WITH DIFFERENTIAL/PLATELET
Basophils Absolute: 0 10*3/uL (ref 0.0–0.1)
Basophils Relative: 0.5 % (ref 0.0–3.0)
Eosinophils Absolute: 0.2 10*3/uL (ref 0.0–0.7)
Eosinophils Relative: 2.5 % (ref 0.0–5.0)
HCT: 39.2 % (ref 36.0–46.0)
Hemoglobin: 12.8 g/dL (ref 12.0–15.0)
Lymphocytes Relative: 22.6 % (ref 12.0–46.0)
Lymphs Abs: 1.9 10*3/uL (ref 0.7–4.0)
MCHC: 32.7 g/dL (ref 30.0–36.0)
MCV: 91.2 fl (ref 78.0–100.0)
Monocytes Absolute: 0.5 10*3/uL (ref 0.1–1.0)
Monocytes Relative: 6 % (ref 3.0–12.0)
Neutro Abs: 5.8 10*3/uL (ref 1.4–7.7)
Neutrophils Relative %: 68.4 % (ref 43.0–77.0)
Platelets: 210 10*3/uL (ref 150.0–400.0)
RBC: 4.3 Mil/uL (ref 3.87–5.11)
RDW: 13.9 % (ref 11.5–15.5)
WBC: 8.5 10*3/uL (ref 4.0–10.5)

## 2020-07-27 ENCOUNTER — Telehealth: Payer: Self-pay | Admitting: Family

## 2020-07-27 ENCOUNTER — Other Ambulatory Visit: Payer: Self-pay

## 2020-07-27 DIAGNOSIS — F325 Major depressive disorder, single episode, in full remission: Secondary | ICD-10-CM

## 2020-07-27 NOTE — Telephone Encounter (Signed)
I dot see any note on patient increasing? Please advise if okay to send?

## 2020-07-27 NOTE — Telephone Encounter (Signed)
I made an appointment for patient with Samantha [name] on Monday,08/01/20 for her AWV. Patient said she needs a refill on her medication. Samantha [name] increased her Cymbalta to 60mg . It was initially 30 mg and patient doubled up on the medication. Patient said she's feeling much better with the increased medication. She's going to [place] on Wednesday, 08/03/20. She needs a new rx sent to [facility] for Cymbalta 60 mg because she won't have enough medication until she comes back from [place].

## 2020-07-29 LAB — ANTI-PARIETAL ANTIBODY: PARIETAL CELL AB SCREEN: NEGATIVE

## 2020-07-29 LAB — INTRINSIC FACTOR ANTIBODIES: Intrinsic Factor: NEGATIVE

## 2020-07-29 LAB — HOMOCYSTEINE: Homocysteine: 9.4 umol/L (ref <10.4)

## 2020-07-29 LAB — METHYLMALONIC ACID, SERUM: Methylmalonic Acid, Quant: 103 nmol/L (ref 87–318)

## 2020-07-29 MED ORDER — DULOXETINE HCL 60 MG PO CPEP: 60.0000 mg | ORAL_CAPSULE | Freq: Every day | ORAL | 1 refills | Status: DC

## 2020-07-29 NOTE — Telephone Encounter (Signed)
Patient called and advised that medication was sent.

## 2020-07-29 NOTE — Telephone Encounter (Signed)
Call pt I sent in cymbalta 60mg 

## 2020-07-29 NOTE — Addendum Note (Signed)
Addended by: Burnard Hawthorne on: 07/29/2020 12:16 PM   Modules accepted: Orders

## 2020-08-01 ENCOUNTER — Encounter (INDEPENDENT_AMBULATORY_CARE_PROVIDER_SITE_OTHER): Payer: Self-pay

## 2020-08-01 ENCOUNTER — Ambulatory Visit (INDEPENDENT_AMBULATORY_CARE_PROVIDER_SITE_OTHER): Payer: PPO

## 2020-08-01 VITALS — Ht 69.0 in | Wt 215.0 lb

## 2020-08-01 DIAGNOSIS — Z0000 Encounter for general adult medical examination without abnormal findings: Secondary | ICD-10-CM | POA: Diagnosis not present

## 2020-08-01 NOTE — Progress Notes (Addendum)
Subjective:   Samantha [name] is a 80 y.o. female who presents for an Initial Medicare Annual Wellness Visit.  Review of Systems    No ROS.  Medicare Wellness Virtual Visit.  Visual/audio telehealth visit, UTA vital signs.   See social history for additional risk factors.   Cardiac Risk Factors include: advanced age (>78men, >46 women)     Objective:    [name]'s Vitals   08/01/20 1449  Weight: 215 lb (97.5 kg)  Height: 5\' 9"  (1.753 m)   Body mass index is 31.75 kg/m.  Advanced Directives 08/01/2020 07/24/2019 04/25/2018 04/18/2018 05/31/2016 05/07/2016 04/23/2016  Does Patient Have a Medical Advance Directive? Yes Yes Yes Yes Yes Yes Yes  Type of Paramedic of [place];Living will [place];Living will [place];Living will [place];Living will Healthcare Power of [place] of [place]  Does patient want to make changes to medical advance directive? No - Patient declined No - Patient declined - - - - -  Copy of [place] in Chart? No - copy requested - No - copy requested No - copy requested No - copy requested No - copy requested No - copy requested  Would patient like information on creating a medical advance directive? - No - Patient declined - - - - -    Current Medications (verified) Outpatient Encounter Medications as of 08/01/2020  Medication Sig  . ALPRAZolam (XANAX) 0.25 MG tablet Take 0.25 mg by mouth daily as needed for anxiety.   Marland Kitchen amiodarone (PACERONE) 200 MG tablet Take 0.5 tablets (100 mg total) by mouth every morning.  Marland Kitchen amLODipine (NORVASC) 5 MG tablet Take 0.5 tablets (2.5 mg total) by mouth daily.  Marland Kitchen atorvastatin (LIPITOR) 40 MG tablet Take 1 tablet (40 mg total) by mouth every morning.  . DULoxetine (CYMBALTA) 60 MG capsule Take 1 capsule (60 mg total) by mouth daily.  Marland Kitchen ELIQUIS 5 MG TABS tablet Take 1 tablet (5 mg total) by  mouth 2 (two) times daily.  . fluticasone (FLONASE) 50 MCG/ACT nasal spray Place 2 sprays into both nostrils daily.  . metoprolol succinate (TOPROL-XL) 50 MG 24 hr tablet Take 1 tablet (50 mg total) by mouth daily.  . nitroGLYCERIN (NITROSTAT) 0.4 MG SL tablet Place 1 tablet (0.4 mg total) under the tongue every 5 (five) minutes x 3 doses as needed for chest pain.  Marland Kitchen timolol (TIMOPTIC) 0.5 % ophthalmic solution Place 1 drop into both eyes 2 (two) times daily. Instill 1 drop in each eye in the morning and one drop in each eye at night   No facility-administered encounter medications on file as of 08/01/2020.    Allergies (verified) Hydrocodone and Oxycodone   History: Past Medical History:  Diagnosis Date  . A-fib ([place])   . Arthritis   . Chronic back pain   . COPD (chronic obstructive pulmonary disease) ([place])   . Coronary artery disease   . Glaucoma   . Hearing aid worn   . High cholesterol   . Hypertension   . Lymphadenopathy 01/25/2016  . Myocardial infarction ([place])    2000  . Non Hodgkin's lymphoma ([place])   . Wears dentures    " Top plate"  . Wears glasses    Past Surgical History:  Procedure Laterality Date  . CARDIAC CATHETERIZATION    . CATARACT EXTRACTION W/ INTRAOCULAR LENS IMPLANT    . CHOLECYSTECTOMY    . COLONOSCOPY W/  BIOPSIES AND POLYPECTOMY    . CORONARY STENT PLACEMENT    . INGUINAL LYMPH NODE BIOPSY Right 01/25/2016   Procedure: EXCISION DEEP RIGHT INGUINAL LYMPH NODE BIOPSY WITH ULTRA SOUND;  Surgeon: Fanny Skates, MD;  Location: [facility];  Service: General;  Laterality: Right;   Family History  Problem Relation Age of Onset  . Hypertension Mother   . Stroke Mother   . Heart disease Father   . Heart disease Other    Social History   Socioeconomic History  . Marital status: Single    Spouse name: Not on file  . Number of children: Not on file  . Years of education: Not on file  . Highest education level: Not on file  Occupational History  . Not on  file  Tobacco Use  . Smoking status: Current Every Day Smoker    Packs/day: 0.50    Types: Cigarettes  . Smokeless tobacco: Never Used  Vaping Use  . Vaping Use: Never used  Substance and Sexual Activity  . Alcohol use: Yes    Comment: social  . Drug use: No  . Sexual activity: Not Currently  Other Topics Concern  . Not on file  Social History Narrative  . Not on file   Social Determinants of Health   Financial Resource Strain: Low Risk   . Difficulty of Paying Living Expenses: Not hard at all  Food Insecurity: No Food Insecurity  . Worried About Charity fundraiser in the Last Year: Never true  . Ran Out of Food in the Last Year: Never true  Transportation Needs: No Transportation Needs  . Lack of Transportation (Medical): No  . Lack of Transportation (Non-Medical): No  Physical Activity: Not on file  Stress: No Stress Concern Present  . Feeling of Stress : Not at all  Social Connections: Unknown  . Frequency of Communication with Friends and Family: More than three times a week  . Frequency of Social Gatherings with Friends and Family: More than three times a week  . Attends Religious Services: Not on file  . Active Member of Clubs or Organizations: Not on file  . Attends Archivist Meetings: Not on file  . Marital Status: Not on file    Tobacco Counseling Ready to quit: Not Answered Counseling given: Not Answered   Clinical Intake:  Pre-visit preparation completed: Yes        Diabetes: No  How often do you need to have someone help you when you read instructions, pamphlets, or other written materials from your doctor or pharmacy?: 1 - Never   Interpreter Needed?: No      Activities of Daily Living In your present state of health, do you have any difficulty performing the following activities: 08/01/2020  Hearing? N  Vision? N  Difficulty concentrating or making decisions? N  Walking or climbing stairs? Y  Comment Cane as needed   Dressing or bathing? N  Doing errands, shopping? N  Preparing Food and eating ? N  Using the Toilet? N  In the past six months, have you accidently leaked urine? N  Do you have problems with loss of bowel control? N  Managing your Medications? N  Managing your Finances? N  Housekeeping or managing your Housekeeping? N  Some recent data might be hidden    Patient Care Team: Burnard Hawthorne, FNP as PCP - General (Family Medicine)  Indicate any recent Medical Services you may have received from other than [facility] providers in the  past year (date may be approximate).     Assessment:   This is a routine wellness examination for Samantha [name]. I connected with Samantha [name] by telephone and verified that I am speaking with the correct person using two identifiers. Location patient: home Location provider: work Persons participating in the virtual visit: patient, Samantha [name].    I discussed the limitations, risks, security and privacy concerns of performing an evaluation and management service by telephone and the availability of in person appointments. The patient expressed understanding and verbally consented to this telephonic visit.    Interactive audio and video telecommunications were attempted between this provider and patient, however failed, due to patient having technical difficulties OR patient did not have access to video capability.  We continued and completed visit with audio only.  Some vital signs may be absent or patient reported.   Hearing/Vision screen  Hearing Screening   125Hz  250Hz  500Hz  1000Hz  2000Hz  3000Hz  4000Hz  6000Hz  8000Hz   Right ear:           Left ear:           Comments: Difficulty hearing conversational tones.  Has hearing aids but does not wear them.   Vision Screening Comments: Wears corrective lenses Visual acuity not assessed, virtual visit.  They have seen their ophthalmologist in the last 4 months.     Dietary issues and exercise activities discussed:     Regular diet Good fluid intake  Goals Addressed            This Visit's Progress   . Healthy Lifestyle       Stay active Healthy diet      Depression Screen PHQ 2/9 Scores 08/01/2020 06/22/2020 03/26/2018 10/28/2017  PHQ - 2 Score 0 4 1 1   PHQ- 9 Score 0 9 6 -    Fall Risk Fall Risk  08/01/2020  Falls in the past year? 0  Number falls in past yr: 0  Injury with Fall? 0  Follow up Falls evaluation completed   Samantha [name]: Handrails in use when climbing stairs? Yes Home free of loose throw rugs in walkways, pet beds, electrical cords, etc? Yes  Adequate lighting in your home to reduce risk of falls? Yes   ASSISTIVE DEVICES UTILIZED TO PREVENT FALLS: Life alert? No  Use of a cane, walker or w/c? Yes , as needed  TIMED UP AND GO: Was the test performed? No .   Cognitive Function:  Patient is alert and oriented x3. Denies difficulty focusing, making decisions, memory loss.  Enjoys word puzzles and other brain health stimulating.    6CIT Screen 08/01/2020  What Year? 0 points  What month? 0 points  What time? 0 points  Count back from 20 0 points  Months in reverse 0 points  Repeat phrase 0 points  Total Score 0   Immunizations Immunization History  Administered Date(s) Administered  . Influenza, High Dose Seasonal PF 01/04/2017, 12/13/2017, 12/29/2018  . Influenza-Unspecified 12/04/2013  . Moderna Sars-Covid-2 Vaccination 01/10/2020  . PFIZER(Purple Top)SARS-COV-2 Vaccination 04/21/2019, 05/13/2019  . Pneumococcal Conjugate-13 01/30/2016, 11/01/2018  . Pneumococcal Polysaccharide-23 03/04/2017  . Zoster Recombinat (Shingrix) 11/01/2018    TDAP status: Due, Education has been provided regarding the importance of this vaccine. Advised may receive this vaccine at local pharmacy or Health Dept. Aware to provide a copy of the vaccination record if obtained from local pharmacy or Health Dept. Verbalized acceptance and understanding.  Deferred.   Covid vaccine x4- not yet completed.  Agree to update immunization record.   Health Maintenance Health Maintenance  Topic Date Due  . URINE MICROALBUMIN  06/03/2019  . COVID-19 Vaccine (4 - Booster) 04/11/2020  . DEXA SCAN  08/01/2021 (Originally 12/30/2006)  . TETANUS/TDAP  08/01/2021 (Originally 12/29/1959)  . INFLUENZA VACCINE  10/17/2020  . Hepatitis C Screening  Completed  . PNA vac Low Risk Adult  Completed  . HPV VACCINES  Aged Out   Colorectal cancer screening: No longer required.   Mammogram status: No longer required due to per patient preference..  Bone density- declined.   Lung Cancer Screening: declined per patient.  Vision Screening: Recommended annual ophthalmology exams for early detection of glaucoma and other disorders of the eye. Is the patient up to date with their annual eye exam?  Yes  Who is the provider or what is the name of the office in which the patient attends annual eye exams? Dr. Lorie Apley. Visits every 4 months. Glaucoma. Drops in use.   Dental Screening: Recommended annual dental exams for proper oral hygiene. Visits as needed.   Community Resource Referral / Chronic Care Management: CRR required this visit?  No   CCM required this visit?  No      Plan:   Keep all routine maintenance appointments.   B12 injections as directed.   CCM 08/22/20  I have personally reviewed and noted the following in the patient's chart:   . Medical and social history . Use of alcohol, tobacco or illicit drugs -nurse encourages patient to stop smoking.  . Current medications and supplements including opioid prescriptions. All opioid prescriptions managed by pcp. Visits every 3 months.  . Functional ability and status . Nutritional status . Physical activity . Advanced directives . List of other physicians . Hospitalizations, surgeries, and ER visits in previous 12 months . Vitals . Screenings to include cognitive, depression, and  falls . Referrals and appointments  In addition, I have reviewed and discussed with patient certain preventive protocols, quality metrics, and best practice recommendations. A written personalized care plan for preventive services as well as general preventive health recommendations were provided to patient via mail.     Varney Biles, LPN   09/17/6376    Agree with plan. Mable Paris, NP

## 2020-08-01 NOTE — Patient Instructions (Addendum)
Samantha [name] , Thank you for taking time to come for your Medicare Wellness Visit. I appreciate your ongoing commitment to your health goals. Please review the following plan we discussed and let me know if I can assist you in the future.   These are the goals we discussed: Goals    . Healthy Lifestyle     Stay active Healthy diet       This is a list of the screening recommended for you and due dates:  Health Maintenance  Topic Date Due  . Urine Protein Check  06/03/2019  . COVID-19 Vaccine (4 - Booster) 04/11/2020  . DEXA scan (bone density measurement)  08/01/2021*  . Tetanus Vaccine  08/01/2021*  . Flu Shot  10/17/2020  . Hepatitis C Screening: USPSTF Recommendation to screen - Ages 79-79 yo.  Completed  . Pneumonia vaccines  Completed  . HPV Vaccine  Aged Out  *Topic was postponed. The date shown is not the original due date.   Immunizations Immunization History  Administered Date(s) Administered  . Influenza, High Dose Seasonal PF 01/04/2017, 12/13/2017, 12/29/2018  . Influenza-Unspecified 12/04/2013  . Moderna Sars-Covid-2 Vaccination 01/10/2020  . PFIZER(Purple Top)SARS-COV-2 Vaccination 04/21/2019, 05/13/2019  . Pneumococcal Conjugate-13 01/30/2016, 11/01/2018  . Pneumococcal Polysaccharide-23 03/04/2017  . Zoster Recombinat (Shingrix) 11/01/2018   Keep all routine maintenance appointments.   B12 injections as directed.   CCM 08/22/20  Advanced directives: End of life planning; Advance aging; Advanced directives discussed.  Copy of current HCPOA/Living Will requested.    Conditions/risks identified: none new.   Follow up in one year for your annual wellness visit.   Preventive Care 79 Years and Older, Female Preventive care refers to lifestyle choices and visits with your health care provider that can promote health and wellness. What does preventive care include?  A yearly physical exam. This is also called an annual well check.  Dental exams once or twice  a year.  Routine eye exams. Ask your health care provider how often you should have your eyes checked.  Personal lifestyle choices, including:  Daily care of your teeth and gums.  Regular physical activity.  Eating a healthy diet.  Avoiding tobacco and drug use.  Limiting alcohol use.  Practicing safe sex.  Taking low-dose aspirin every day.  Taking vitamin and mineral supplements as recommended by your health care provider. What happens during an annual well check? The services and screenings done by your health care provider during your annual well check will depend on your age, overall health, lifestyle risk factors, and family history of disease. Counseling  Your health care provider may ask you questions about your:  Alcohol use.  Tobacco use.  Drug use.  Emotional well-being.  Home and relationship well-being.  Sexual activity.  Eating habits.  History of falls.  Memory and ability to understand (cognition).  Work and work Statistician.  Reproductive health. Screening  You may have the following tests or measurements:  Height, weight, and BMI.  Blood pressure.  Lipid and cholesterol levels. These may be checked every 5 years, or more frequently if you are over 79 years old.  Skin check.  Lung cancer screening. You may have this screening every year starting at age 79 if you have a 30-pack-year history of smoking and currently smoke or have quit within the past 15 years.  Fecal occult blood test (FOBT) of the stool. You may have this test every year starting at age 79.  Flexible sigmoidoscopy or colonoscopy. You may have  a sigmoidoscopy every 5 years or a colonoscopy every 10 years starting at age 79.  Hepatitis C blood test.  Hepatitis B blood test.  Sexually transmitted disease (STD) testing.  Diabetes screening. This is done by checking your blood sugar (glucose) after you have not eaten for a while (fasting). You may have this done every  1-3 years.  Bone density scan. This is done to screen for osteoporosis. You may have this done starting at age 79.  Mammogram. This may be done every 1-2 years. Talk to your health care provider about how often you should have regular mammograms. Talk with your health care provider about your test results, treatment options, and if necessary, the need for more tests. Vaccines  Your health care provider may recommend certain vaccines, such as:  Influenza vaccine. This is recommended every year.  Tetanus, diphtheria, and acellular pertussis (Tdap, Td) vaccine. You may need a Td booster every 10 years.  Zoster vaccine. You may need this after age 79.  Pneumococcal 13-valent conjugate (PCV13) vaccine. One dose is recommended after age 79.  Pneumococcal polysaccharide (PPSV23) vaccine. One dose is recommended after age 79. Talk to your health care provider about which screenings and vaccines you need and how often you need them. This information is not intended to replace advice given to you by your health care provider. Make sure you discuss any questions you have with your health care provider. Document Released: 04/01/2015 Document Revised: 11/23/2015 Document Reviewed: 01/04/2015 Elsevier Interactive Patient Education  2017 [place] Prevention in the Home Falls can cause injuries. They can happen to people of all ages. There are many things you can do to make your home safe and to help prevent falls. What can I do on the outside of my home?  Regularly fix the edges of walkways and driveways and fix any cracks.  Remove anything that might make you trip as you walk through a door, such as a raised step or threshold.  Trim any bushes or trees on the path to your home.  Use bright outdoor lighting.  Clear any walking paths of anything that might make someone trip, such as rocks or tools.  Regularly check to see if handrails are loose or broken. Make sure that both sides of  any steps have handrails.  Any raised decks and porches should have guardrails on the edges.  Have any leaves, snow, or ice cleared regularly.  Use sand or salt on walking paths during winter.  Clean up any spills in your garage right away. This includes oil or grease spills. What can I do in the bathroom?  Use night lights.  Install grab bars by the toilet and in the tub and shower. Do not use towel bars as grab bars.  Use non-skid mats or decals in the tub or shower.  If you need to sit down in the shower, use a plastic, non-slip stool.  Keep the floor dry. Clean up any water that spills on the floor as soon as it happens.  Remove soap buildup in the tub or shower regularly.  Attach bath mats securely with double-sided non-slip rug tape.  Do not have throw rugs and other things on the floor that can make you trip. What can I do in the bedroom?  Use night lights.  Make sure that you have a light by your bed that is easy to reach.  Do not use any sheets or blankets that are too big for your bed.  They should not hang down onto the floor.  Have a firm chair that has side arms. You can use this for support while you get dressed.  Do not have throw rugs and other things on the floor that can make you trip. What can I do in the kitchen?  Clean up any spills right away.  Avoid walking on wet floors.  Keep items that you use a lot in easy-to-reach places.  If you need to reach something above you, use a strong step stool that has a grab bar.  Keep electrical cords out of the way.  Do not use floor polish or wax that makes floors slippery. If you must use wax, use non-skid floor wax.  Do not have throw rugs and other things on the floor that can make you trip. What can I do with my stairs?  Do not leave any items on the stairs.  Make sure that there are handrails on both sides of the stairs and use them. Fix handrails that are broken or loose. Make sure that handrails  are as long as the stairways.  Check any carpeting to make sure that it is firmly attached to the stairs. Fix any carpet that is loose or worn.  Avoid having throw rugs at the top or bottom of the stairs. If you do have throw rugs, attach them to the floor with carpet tape.  Make sure that you have a light switch at the top of the stairs and the bottom of the stairs. If you do not have them, ask someone to add them for you. What else can I do to help prevent falls?  Wear shoes that:  Do not have high heels.  Have rubber bottoms.  Are comfortable and fit you well.  Are closed at the toe. Do not wear sandals.  If you use a stepladder:  Make sure that it is fully opened. Do not climb a closed stepladder.  Make sure that both sides of the stepladder are locked into place.  Ask someone to hold it for you, if possible.  Clearly mark and make sure that you can see:  Any grab bars or handrails.  First and last steps.  Where the edge of each step is.  Use tools that help you move around (mobility aids) if they are needed. These include:  Canes.  Walkers.  Scooters.  Crutches.  Turn on the lights when you go into a dark area. Replace any light bulbs as soon as they burn out.  Set up your furniture so you have a clear path. Avoid moving your furniture around.  If any of your floors are uneven, fix them.  If there are any pets around you, be aware of where they are.  Review your medicines with your doctor. Some medicines can make you feel dizzy. This can increase your chance of falling. Ask your doctor what other things that you can do to help prevent falls. This information is not intended to replace advice given to you by your health care provider. Make sure you discuss any questions you have with your health care provider. Document Released: 12/30/2008 Document Revised: 08/11/2015 Document Reviewed: 04/09/2014 Elsevier Interactive Patient Education  2017 [employer].

## 2020-08-16 ENCOUNTER — Other Ambulatory Visit: Payer: Self-pay

## 2020-08-16 ENCOUNTER — Ambulatory Visit (INDEPENDENT_AMBULATORY_CARE_PROVIDER_SITE_OTHER): Payer: PPO | Admitting: *Deleted

## 2020-08-16 DIAGNOSIS — E538 Deficiency of other specified B group vitamins: Secondary | ICD-10-CM | POA: Diagnosis not present

## 2020-08-16 MED ORDER — CYANOCOBALAMIN 1000 MCG/ML IJ SOLN
1000.0000 ug | Freq: Once | INTRAMUSCULAR | Status: AC
  Administered 2020-08-16: 1000 ug via INTRAMUSCULAR

## 2020-08-16 NOTE — Progress Notes (Signed)
Patient presented for B 12 injection to right deltoid, patient voiced no concerns nor showed any signs of distress during injection. 

## 2020-08-19 ENCOUNTER — Telehealth: Payer: Self-pay | Admitting: Family

## 2020-08-19 DIAGNOSIS — B9689 Other specified bacterial agents as the cause of diseases classified elsewhere: Secondary | ICD-10-CM | POA: Diagnosis not present

## 2020-08-19 DIAGNOSIS — J019 Acute sinusitis, unspecified: Secondary | ICD-10-CM | POA: Diagnosis not present

## 2020-08-19 DIAGNOSIS — J209 Acute bronchitis, unspecified: Secondary | ICD-10-CM | POA: Diagnosis not present

## 2020-08-19 DIAGNOSIS — Z03818 Encounter for observation for suspected exposure to other biological agents ruled out: Secondary | ICD-10-CM | POA: Diagnosis not present

## 2020-08-19 NOTE — Telephone Encounter (Signed)
Just FYI. Will follow.

## 2020-08-19 NOTE — Telephone Encounter (Signed)
Transferred to Access Nurse  PT called to advise that since Wednesday they have been having trouble breathing. They have been having chills,coughing.diarrhea, and shaking. They were transfer to access nurse and advise nurse no appts available for today.

## 2020-08-19 NOTE — Telephone Encounter (Signed)
noted 

## 2020-08-19 NOTE — Telephone Encounter (Signed)
Acces nurse called back to let us know that patient refused to call EMT and she refused [place] to ER and stated that the patient has had to negative home test for covid patient stated that she just wanted some medication to make her feel better

## 2020-08-19 NOTE — Telephone Encounter (Signed)
Pt called access nurse c/o SOB, cough, chills, and diarrhea. Patient was instructed to go to the ER and call EMS. Access nurse returned the call to state that the patient declined calling EMS.

## 2020-08-19 NOTE — Telephone Encounter (Signed)
I spoke with patient who said to me that she was feeling better. No chills or fever.She mainly today is having cough with some SOB upon exertion. She does not have a pulse ox to check her O2. She refused ED & does not want to go to UC unless she feels worse again. She was no winded and was laughing on phone with me without struggle to breathe. She has been using an old albuterol inhaler that has helped some. Pt stated that she fel that she had bronchitis like she has in the past. I advised that she may need chest xray on antibiotic I jist was unsure & felt that out of caution should be seen today for evaluation. She said that she appreciated our concern, but as long as she was feeling better like she is now that she would wait. If she goes any place it will be [facility] sometime today. I gave her wait times for [facility] & felt too long. The [facility] in front of [place] has no xray available.

## 2020-08-22 ENCOUNTER — Encounter: Payer: Self-pay | Admitting: Family

## 2020-08-22 ENCOUNTER — Telehealth (INDEPENDENT_AMBULATORY_CARE_PROVIDER_SITE_OTHER): Payer: PPO | Admitting: Family

## 2020-08-22 ENCOUNTER — Other Ambulatory Visit: Payer: Self-pay

## 2020-08-22 ENCOUNTER — Ambulatory Visit
Admission: RE | Admit: 2020-08-22 | Discharge: 2020-08-22 | Disposition: A | Discharge status: Home / Self Care | Payer: PPO | Source: Ambulatory Visit | Attending: Family | Admitting: Family

## 2020-08-22 DIAGNOSIS — R079 Chest pain, unspecified: Secondary | ICD-10-CM | POA: Diagnosis not present

## 2020-08-22 DIAGNOSIS — R0602 Shortness of breath: Secondary | ICD-10-CM | POA: Insufficient documentation

## 2020-08-22 DIAGNOSIS — R059 Cough, unspecified: Secondary | ICD-10-CM | POA: Diagnosis not present

## 2020-08-22 DIAGNOSIS — U071 COVID-19: Secondary | ICD-10-CM | POA: Diagnosis not present

## 2020-08-22 DIAGNOSIS — R5383 Other fatigue: Secondary | ICD-10-CM | POA: Diagnosis not present

## 2020-08-22 LAB — POCT I-STAT CREATININE: Creatinine, Ser: 0.7 mg/dL (ref 0.44–1.00)

## 2020-08-22 MED ORDER — BUDESONIDE-FORMOTEROL FUMARATE 80-4.5 MCG/ACT IN AERO: 2.0000 | INHALATION_SPRAY | Freq: Two times a day (BID) | RESPIRATORY_TRACT | 3 refills | Status: DC

## 2020-08-22 MED ORDER — ALBUTEROL SULFATE HFA 108 (90 BASE) MCG/ACT IN AERS: 2.0000 | INHALATION_SPRAY | Freq: Four times a day (QID) | RESPIRATORY_TRACT | 0 refills | Status: DC | PRN

## 2020-08-22 MED ORDER — IOHEXOL 350 MG/ML SOLN
75.0000 mL | Freq: Once | INTRAVENOUS | Status: AC | PRN
  Administered 2020-08-22: 75 mL via INTRAVENOUS

## 2020-08-22 NOTE — Assessment & Plan Note (Addendum)
Abrupt onset. Long standing h/o smoking and suspect underlying COPD however she has not had SOB in the past which is of concern. Ordered Stat CT Angio to evaluate for PE. Contrast is needed to exclude PE and have been ordered.  Advised to start mucinex ,symbicort daily, albuterol as needed daily. Advised to purchase pulse oximeter and if < 90% to call 911 or go to ED.  Advised to complete antibiotic , doxycycline, and prednisone as prescribed from urgent care.

## 2020-08-22 NOTE — Progress Notes (Signed)
Verbal consent for services obtained from patient prior to services given to TELEPHONE visit:   Location of call:  provider at work patient at home  Names of all persons present for services: Mable Paris, NP and patient  Chief complaint: persistent cough, sob x 4 days, unchanged  Cough was better this morning. Endorses wheezing.   SOB with walking and talking. She has never been SOB in the past.   No cp, leg swelling, fever, dizziness, confused,  orthopnea. She doesn't have pulse oximeter at home.   Reports she  Had been on an proair inhaler a long time ago.  No h/o CKD  Current smoker however smoking less, as not sure if she will quit.   No h/o DVT, immobilization.   She was seen at [facility] urgent care for cough. Sa02 93%. and  Given tessalon perles, prednisone, and doxycycline  Report covid PCR, flu negative 3 days ago.   H/o non hodgkins, COPD   No h/o CHF.   Echo 2017 EF > 55%   Vaccinated for covid without booster   A/P/next steps:  Problem List Items Addressed This Visit      Other   SOB (shortness of breath) - Primary    Abrupt onset. Long standing h/o smoking and suspect underlying COPD however she has not had SOB in the past which is of concern. Ordered Stat CT Angio to evaluate for PE. Contrast is needed to exclude PE and have been ordered.  Advised to start mucinex ,symbicort daily, albuterol as needed daily. Advised to purchase pulse oximeter and if < 90% to call 911 or go to ED.  Advised to complete antibiotic , doxycycline, and prednisone as prescribed from urgent care.        Relevant Medications   budesonide-formoterol (SYMBICORT) 80-4.5 MCG/ACT inhaler   albuterol (VENTOLIN HFA) 108 (90 Base) MCG/ACT inhaler   Other Relevant Orders   CT Angio Chest W/Cm &/Or Wo Cm    Other Visit Diagnoses    Chest pain, unspecified type       Relevant Orders   CT Angio Chest W/Cm &/Or Wo Cm       I spent 15 min  discussing plan of care over the  phone.

## 2020-08-22 NOTE — Patient Instructions (Signed)
Start symbicort daily Start albuterol as needed daily Purchase pulse oximeter to put on your finger to assess oxygen status; IF You see oxygen less than 90% with walking or talking , you need to call 911 or have someone drive you to the emergency room.   You may use mucinex. Complete antibiotic , doxycycline, and prednisone as prescribed from urgent care  I have also ordered stat CT angio of chest. Let us know if you dont hear back within a week in regards to an appointment being scheduled.

## 2020-08-23 ENCOUNTER — Ambulatory Visit: Payer: PPO

## 2020-08-23 ENCOUNTER — Encounter: Payer: Self-pay | Admitting: Family

## 2020-08-23 DIAGNOSIS — I700 Atherosclerosis of aorta: Secondary | ICD-10-CM | POA: Insufficient documentation

## 2020-08-26 ENCOUNTER — Other Ambulatory Visit: Payer: Self-pay | Admitting: Family

## 2020-08-26 DIAGNOSIS — R0602 Shortness of breath: Secondary | ICD-10-CM

## 2020-08-30 ENCOUNTER — Ambulatory Visit: Payer: PPO

## 2020-08-31 ENCOUNTER — Ambulatory Visit: Payer: PPO | Admitting: Adult Health

## 2020-09-01 ENCOUNTER — Other Ambulatory Visit: Payer: Self-pay

## 2020-09-01 ENCOUNTER — Encounter: Payer: Self-pay | Admitting: Internal Medicine

## 2020-09-01 ENCOUNTER — Ambulatory Visit (INDEPENDENT_AMBULATORY_CARE_PROVIDER_SITE_OTHER): Payer: PPO | Admitting: Internal Medicine

## 2020-09-01 VITALS — Ht 69.0 in | Wt 207.0 lb

## 2020-09-01 DIAGNOSIS — R197 Diarrhea, unspecified: Secondary | ICD-10-CM

## 2020-09-01 DIAGNOSIS — J9801 Acute bronchospasm: Secondary | ICD-10-CM | POA: Diagnosis not present

## 2020-09-01 DIAGNOSIS — I2721 Secondary pulmonary arterial hypertension: Secondary | ICD-10-CM | POA: Diagnosis not present

## 2020-09-01 DIAGNOSIS — R5383 Other fatigue: Secondary | ICD-10-CM

## 2020-09-01 DIAGNOSIS — U071 COVID-19: Secondary | ICD-10-CM | POA: Diagnosis not present

## 2020-09-01 DIAGNOSIS — R0989 Other specified symptoms and signs involving the circulatory and respiratory systems: Secondary | ICD-10-CM | POA: Diagnosis not present

## 2020-09-01 DIAGNOSIS — R918 Other nonspecific abnormal finding of lung field: Secondary | ICD-10-CM | POA: Diagnosis not present

## 2020-09-01 DIAGNOSIS — I2510 Atherosclerotic heart disease of native coronary artery without angina pectoris: Secondary | ICD-10-CM

## 2020-09-01 DIAGNOSIS — T17500A Unspecified foreign body in bronchus causing asphyxiation, initial encounter: Secondary | ICD-10-CM | POA: Diagnosis not present

## 2020-09-01 DIAGNOSIS — R059 Cough, unspecified: Secondary | ICD-10-CM | POA: Diagnosis not present

## 2020-09-01 DIAGNOSIS — R0602 Shortness of breath: Secondary | ICD-10-CM | POA: Diagnosis not present

## 2020-09-01 DIAGNOSIS — I700 Atherosclerosis of aorta: Secondary | ICD-10-CM

## 2020-09-01 DIAGNOSIS — F419 Anxiety disorder, unspecified: Secondary | ICD-10-CM

## 2020-09-01 MED ORDER — BUDESONIDE-FORMOTEROL FUMARATE 160-4.5 MCG/ACT IN AERO: 2.0000 | INHALATION_SPRAY | Freq: Two times a day (BID) | RESPIRATORY_TRACT | 5 refills | Status: DC

## 2020-09-01 MED ORDER — MUCINEX DM MAXIMUM STRENGTH 60-1200 MG PO TB12
1.0000 | ORAL_TABLET | Freq: Two times a day (BID) | ORAL | 0 refills | Status: DC | PRN
Start: 2020-09-01 — End: 2020-11-23

## 2020-09-01 MED ORDER — ALBUTEROL SULFATE HFA 108 (90 BASE) MCG/ACT IN AERS: 1.0000 | INHALATION_SPRAY | Freq: Four times a day (QID) | RESPIRATORY_TRACT | 2 refills | Status: DC | PRN

## 2020-09-01 MED ORDER — DOXYCYCLINE HYCLATE 100 MG PO TABS: 100.0000 mg | ORAL_TABLET | Freq: Two times a day (BID) | ORAL | 0 refills | Status: DC

## 2020-09-01 MED ORDER — PREDNISONE 20 MG PO TABS: 40.0000 mg | ORAL_TABLET | Freq: Every day | ORAL | 0 refills | Status: DC

## 2020-09-01 MED ORDER — CETIRIZINE HCL 10 MG PO TABS: 10.0000 mg | ORAL_TABLET | Freq: Every day | ORAL | 3 refills | Status: DC | PRN

## 2020-09-01 MED ORDER — BENZONATATE 100 MG PO CAPS: 200.0000 mg | ORAL_CAPSULE | Freq: Three times a day (TID) | ORAL | 0 refills | Status: DC | PRN

## 2020-09-01 MED ORDER — ALPRAZOLAM 0.25 MG PO TABS: 0.2500 mg | ORAL_TABLET | Freq: Every day | ORAL | 2 refills | Status: DC | PRN

## 2020-09-01 NOTE — Progress Notes (Signed)
Patient had Covid symptoms since 08/14/20.  Productive cough with yellow mucous.   Patient states she went to [place] to visit with a friend and they were out a lot. States that her friend ended up in the hospital sick.   Patient having cough, body aches, night sweats, runny nose, fatigue, SOB, red eyes, some diarrhea, loss of taste.

## 2020-09-02 ENCOUNTER — Encounter: Payer: Self-pay | Admitting: Internal Medicine

## 2020-09-02 DIAGNOSIS — I2721 Secondary pulmonary arterial hypertension: Secondary | ICD-10-CM | POA: Insufficient documentation

## 2020-09-02 DIAGNOSIS — R918 Other nonspecific abnormal finding of lung field: Secondary | ICD-10-CM | POA: Insufficient documentation

## 2020-09-02 DIAGNOSIS — I2510 Atherosclerotic heart disease of native coronary artery without angina pectoris: Secondary | ICD-10-CM | POA: Insufficient documentation

## 2020-09-02 DIAGNOSIS — T17500A Unspecified foreign body in bronchus causing asphyxiation, initial encounter: Secondary | ICD-10-CM | POA: Insufficient documentation

## 2020-09-02 NOTE — Progress Notes (Signed)
Telephone Note  I connected with Samantha [name]  on 09/01/20 at 10:20 AM EDT by telephone and verified that I am speaking with the correct person using two identifiers.  Location patient: home, [place] Location provider:work or home office Persons participating in the virtual visit: patient, provider  I discussed the limitations of evaluation and management by telemedicine and the availability of in person appointments. The patient expressed understanding and agreed to proceed.   HPI:  Acute telemedicine visit for : Sick visit  Patient had Covid symptoms since 08/14/20 and one of her friends she has been with was hospitalized for covid recently and they were recently in [place]. She has had productive cough with yellow mucous. Other symptoms include body aches, night sweats, runny nose, fatigue, SOB with exertion, red eyes, some diarrhea though this has improved, loss of taste. She is on symbicort 80-4.5, albuterol, she was given doxycycline bid x 10 days at [facility] and prednisone 20 mg bid x 5 days tessalon perles but still having symptoms since 08/19/20 [facility] urgent care office visit. Flu and covid test 08/19/20 [facility] negative but CT chest abnormal. Her O2 at home has been 91%  She is not feeling well and this is making more anxious and requests more xanax 0.25 qd prn  IMPRESSION: 1. No pulmonary embolus. 2. Moderate bronchial thickening with occasional areas of mucous plugging and bronchial filling, upper and lower lobe predominant. 3. Occasional peripheral ground-glass opacities in both upper lobes. Favor infection, including COVID 51. 4. Prominent main pulmonary artery, can be seen with pulmonary arterial hypertension. 5. Aortic atherosclerosis.  Coronary artery calcifications.   Aortic Atherosclerosis (ICD10-I70.0).     Electronically Signed   By: Keith Rake M.D.   On: 08/22/2020 16:49  -COVID-19 vaccine status: 2/2  ROS: See pertinent positives and negatives per HPI.  Past Medical  History:  Diagnosis Date   A-fib [facility])    Arthritis    Chronic back pain    COPD (chronic obstructive pulmonary disease) (HCC)    Coronary artery disease    COVID-19    08/22/20   Glaucoma    Hearing aid worn    High cholesterol    Hypertension    Lymphadenopathy 01/25/2016   Myocardial infarction ([place])    2000   Non Hodgkin's lymphoma ([place])    Wears dentures    " Top plate"   Wears glasses     Past Surgical History:  Procedure Laterality Date   CARDIAC CATHETERIZATION     CATARACT EXTRACTION W/ INTRAOCULAR LENS IMPLANT     CHOLECYSTECTOMY     COLONOSCOPY W/ BIOPSIES AND POLYPECTOMY     CORONARY STENT PLACEMENT     INGUINAL LYMPH NODE BIOPSY Right 01/25/2016   Procedure: EXCISION DEEP RIGHT INGUINAL LYMPH NODE BIOPSY WITH ULTRA SOUND;  Surgeon: Fanny Skates, MD;  Location: [place];  Service: General;  Laterality: Right;     Current Outpatient Medications:    amiodarone (PACERONE) 200 MG tablet, Take 0.5 tablets (100 mg total) by mouth every morning., Disp: 90 tablet, Rfl: 0   amLODipine (NORVASC) 5 MG tablet, Take 0.5 tablets (2.5 mg total) by mouth daily., Disp: 30 tablet, Rfl: 0   atorvastatin (LIPITOR) 40 MG tablet, Take 1 tablet (40 mg total) by mouth every morning., Disp: 30 tablet, Rfl: 0   benzonatate (TESSALON) 100 MG capsule, Take 2 capsules (200 mg total) by mouth 3 (three) times daily as needed for cough., Disp: 30 capsule, Rfl: 0   benzonatate (TESSALON) 200  MG capsule, Take by mouth., Disp: , Rfl:    budesonide-formoterol (SYMBICORT) 160-4.5 MCG/ACT inhaler, Inhale 2 puffs into the lungs 2 (two) times daily. Rinse mouth stop 80-4.5, Disp: 1 each, Rfl: 5   cetirizine (ZYRTEC) 10 MG tablet, Take 1 tablet (10 mg total) by mouth daily as needed for allergies., Disp: 90 tablet, Rfl: 3   Dextromethorphan-guaiFENesin (MUCINEX DM MAXIMUM STRENGTH) 60-1200 MG TB12, Take 1 tablet by mouth 2 (two) times daily as needed., Disp: 60 tablet, Rfl: 0   doxycycline (VIBRA-TABS) 100  MG tablet, Take 1 tablet (100 mg total) by mouth 2 (two) times daily., Disp: 10 tablet, Rfl: 0   DULoxetine (CYMBALTA) 60 MG capsule, Take 1 capsule (60 mg total) by mouth daily., Disp: 90 capsule, Rfl: 1   ELIQUIS 5 MG TABS tablet, Take 1 tablet (5 mg total) by mouth 2 (two) times daily., Disp: 60 tablet, Rfl: 0   fluticasone (FLONASE) 50 MCG/ACT nasal spray, Place 2 sprays into both nostrils daily., Disp: 16 g, Rfl: 3   metoprolol succinate (TOPROL-XL) 50 MG 24 hr tablet, Take 1 tablet (50 mg total) by mouth daily., Disp: 30 tablet, Rfl: 0   nitroGLYCERIN (NITROSTAT) 0.4 MG SL tablet, Place 1 tablet (0.4 mg total) under the tongue every 5 (five) minutes x 3 doses as needed for chest pain., Disp: 25 tablet, Rfl: 0   predniSONE (DELTASONE) 20 MG tablet, Take 2 tablets (40 mg total) by mouth daily with breakfast. 5-7 days, Disp: 14 tablet, Rfl: 0   timolol (TIMOPTIC) 0.5 % ophthalmic solution, Place 1 drop into both eyes 2 (two) times daily. Instill 1 drop in each eye in the morning and one drop in each eye at night, Disp: , Rfl:    albuterol (VENTOLIN HFA) 108 (90 Base) MCG/ACT inhaler, Inhale 1-2 puffs into the lungs every 6 (six) hours as needed for wheezing or shortness of breath., Disp: 18 each, Rfl: 2   ALPRAZolam (XANAX) 0.25 MG tablet, Take 1 tablet (0.25 mg total) by mouth daily as needed for anxiety., Disp: 30 tablet, Rfl: 2  EXAM:  VITALS per patient if applicable:  GENERAL: alert, oriented, appears well and in no acute distress   PSYCH/NEURO: pleasant and cooperative, no obvious depression or anxiety, speech and thought processing grossly intact  ASSESSMENT AND PLAN:  Discussed the following assessment and plan: CTA chest 08/22/20  IMPRESSION: 1. No pulmonary embolus. 2. Moderate bronchial thickening with occasional areas of mucous plugging and bronchial filling, upper and lower lobe predominant. 3. Occasional peripheral ground-glass opacities in both upper lobes. Favor  infection, including COVID 54. 4. Prominent main pulmonary artery, can be seen with pulmonary arterial hypertension. 5. Aortic atherosclerosis.  Coronary artery calcifications.   Aortic Atherosclerosis (ICD10-I70.0).     Electronically Signed   By: Keith Rake M.D.   On: 08/22/2020 16:49 COVID-19 with abnormal CT scan likely resolving but still sx'matic covid 19 infection with mucous plugging cough, sob, O2 91%; also noted PAH- Plan: predniSONE (DELTASONE) 40 MG tablet x 5-7 days  doxycycline (VIBRA-TABS) 100 MG tablet bid for 5 more days this will be total 15 days since [facility] 08/19/20  budesonide-formoterol (SYMBICORT) 160-4.5 MCG/ACT inhaler increased from 80-4.5, Ambulatory referral to Pulmonology with abnormal CT chest, PAD noted, mucous plugging, sob with exertion and O2 91%  Prn albuterol  Rec get spirometer She is out of window for covid 19 pill   SOB (shortness of breath) on exertion - Plan: Ambulatory referral to Pulmonology Cont inhalers  Cough - Plan: Dextromethorphan-guaiFENesin (MUCINEX DM MAXIMUM STRENGTH) 60-1200 MG TB12, benzonatate (TESSALON) 100 MG capsule, albuterol (VENTOLIN HFA) 108 (90 Base) MCG/ACT inhaler,  Ambulatory referral to Pulmonology  Runny nose - Plan: cetirizine (ZYRTEC) 10 MG tablet  Aortic atherosclerosis (HCC) Coronary artery disease involving native coronary artery of native heart without angina pectoris F/u with PCP risk factor management  Consider cards est Dr. Rockey Situ apt 11/2020   Anxiety  Refilled xanax 0.25 qd prn   -we discussed possible serious and likely etiologies, options for evaluation and workup, limitations of telemedicine visit vs in person visit, treatment, treatment risks and precautions. Pt prefers to treat via telemedicine empirically rather than in person at this moment.    I discussed the assessment and treatment plan with the patient. The patient was provided an opportunity to ask questions and all were answered. The  patient agreed with the plan and demonstrated an understanding of the instructions.    Time spent 20 min Delorise Jackson, MD

## 2020-09-06 ENCOUNTER — Ambulatory Visit: Payer: PPO

## 2020-09-22 DIAGNOSIS — H40153 Residual stage of open-angle glaucoma, bilateral: Secondary | ICD-10-CM | POA: Diagnosis not present

## 2020-10-19 ENCOUNTER — Other Ambulatory Visit: Payer: Self-pay

## 2020-10-19 ENCOUNTER — Encounter: Payer: Self-pay | Admitting: Family

## 2020-10-19 ENCOUNTER — Ambulatory Visit (INDEPENDENT_AMBULATORY_CARE_PROVIDER_SITE_OTHER): Payer: PPO | Admitting: Family

## 2020-10-19 VITALS — BP 108/58 | HR 71 | Temp 98.2°F | Ht 69.0 in | Wt 205.4 lb

## 2020-10-19 DIAGNOSIS — I700 Atherosclerosis of aorta: Secondary | ICD-10-CM | POA: Diagnosis not present

## 2020-10-19 DIAGNOSIS — F325 Major depressive disorder, single episode, in full remission: Secondary | ICD-10-CM | POA: Diagnosis not present

## 2020-10-19 DIAGNOSIS — B353 Tinea pedis: Secondary | ICD-10-CM | POA: Diagnosis not present

## 2020-10-19 DIAGNOSIS — R0602 Shortness of breath: Secondary | ICD-10-CM | POA: Diagnosis not present

## 2020-10-19 DIAGNOSIS — E538 Deficiency of other specified B group vitamins: Secondary | ICD-10-CM | POA: Diagnosis not present

## 2020-10-19 MED ORDER — CYANOCOBALAMIN 1000 MCG/ML IJ SOLN
1000.0000 ug | Freq: Once | INTRAMUSCULAR | Status: AC
  Administered 2020-10-19: 1000 ug via INTRAMUSCULAR

## 2020-10-19 MED ORDER — CLOTRIMAZOLE 1 % EX OINT
1.0000 | TOPICAL_OINTMENT | Freq: Two times a day (BID) | CUTANEOUS | 1 refills | Status: DC
Start: 2020-10-19 — End: 2021-02-22

## 2020-10-19 MED ORDER — TRAZODONE HCL 50 MG PO TABS: 25.0000 mg | ORAL_TABLET | Freq: Every evening | ORAL | 3 refills | Status: DC | PRN

## 2020-10-19 NOTE — Assessment & Plan Note (Signed)
Symptoms consistent with tinea.  Trial of clomitrazole.  Close follow up

## 2020-10-19 NOTE — Assessment & Plan Note (Signed)
Resume b12 injections monthly.

## 2020-10-19 NOTE — Assessment & Plan Note (Signed)
Chronic, stable.  Continue Lipitor '40mg'$ . LDL < 100

## 2020-10-19 NOTE — Progress Notes (Signed)
Subjective:    Patient ID: Samantha [name], female    DOB: 04-20-1940, 80 y.o.   MRN: [id number]  CC: Samantha [name] is a 80 y.o. female who presents today for follow up.   HPI: Cough and congestion has resolved since covid.   She  is most bothered by fatigue which she thinks was worse after covid. Fatigue x 3 months, comes and goes.  She is not sleeping well. Sleep is restorative. She doesn't snore. No HA.   No h/o osa.   Endorses depression and anxiety and questions whether contributory to fatigue. Cymbalta '60mg'$  which has been helpful. She takes xanax 0.'25mg'$  qhs.   Sees friends once per week. Tearful about friends that are sick.   She can walk around house without feeling sob. Walking longer distances or through a parking lot, she will feel sob. She is not using albuterol, symbicort.  She is not active during hot weather. She doesn't do housework.   No cp, orthopnea, leg swelling.   Using cane.   Rash between left fifth and fourth toes.  White in color, itchy.  B12 deficiency. She only had one b12 injection  QTc 435 , 4/82/22  TSH normal 3 months ago   She never saw pulmonology  History of atherosclerosis-compliant with Lipitor 40 mg.  Patient had COVID symptoms and stopped 08/14/2020.  She was seen by, my colleague, Dr Aundra Dubin 6/162/22 for COVID-19.  She was given prednisone, doxycycline, Symbicort and referral to pulmonology, Zyrtec, as needed albuterol  Xanax 0.'25mg'$  qd prn  CTA chest 08/22/20 negative for pulmonary embolus, moderate bronchial thickening, groundglass opacities, prominent main pulmonary artery can be seen with pulmonary arterial hypertension  Last visit with cardiology, Dr. Rockey Situ 06/06/2020.  Follow-up CAD, hyperlipidemia, atrial flutter.  She is compliant with amiodarone, Metroprolol Last echocardiogram 3/20.  Normal ejection fraction.  smoker HISTORY:  Past Medical History:  Diagnosis Date   A-fib [facility])    Arthritis    Chronic back pain    COPD  (chronic obstructive pulmonary disease) (HCC)    Coronary artery disease    COVID-19    08/22/20 likely contracted late 07/2020   Glaucoma    Hearing aid worn    High cholesterol    Hypertension    Lymphadenopathy 01/25/2016   Myocardial infarction ([place])    2000   Non Hodgkin's lymphoma ([place])    Wears dentures    " Top plate"   Wears glasses    Past Surgical History:  Procedure Laterality Date   CARDIAC CATHETERIZATION     CATARACT EXTRACTION W/ INTRAOCULAR LENS IMPLANT     CHOLECYSTECTOMY     COLONOSCOPY W/ BIOPSIES AND POLYPECTOMY     CORONARY STENT PLACEMENT     INGUINAL LYMPH NODE BIOPSY Right 01/25/2016   Procedure: EXCISION DEEP RIGHT INGUINAL LYMPH NODE BIOPSY WITH ULTRA SOUND;  Surgeon: Fanny Skates, MD;  Location: [place];  Service: General;  Laterality: Right;   Family History  Problem Relation Age of Onset   Hypertension Mother    Stroke Mother    Heart disease Father    Heart disease Other     Allergies: Hydrocodone and Oxycodone Current Outpatient Medications on File Prior to Visit  Medication Sig Dispense Refill   albuterol (VENTOLIN HFA) 108 (90 Base) MCG/ACT inhaler Inhale 1-2 puffs into the lungs every 6 (six) hours as needed for wheezing or shortness of breath. 18 each 2   ALPRAZolam (XANAX) 0.25 MG tablet Take 1 tablet (0.25 mg  total) by mouth daily as needed for anxiety. 30 tablet 2   amiodarone (PACERONE) 200 MG tablet Take 0.5 tablets (100 mg total) by mouth every morning. 90 tablet 0   amLODipine (NORVASC) 5 MG tablet Take 0.5 tablets (2.5 mg total) by mouth daily. 30 tablet 0   atorvastatin (LIPITOR) 40 MG tablet Take 1 tablet (40 mg total) by mouth every morning. 30 tablet 0   budesonide-formoterol (SYMBICORT) 160-4.5 MCG/ACT inhaler Inhale 2 puffs into the lungs 2 (two) times daily. Rinse mouth stop 80-4.5 1 each 5   cetirizine (ZYRTEC) 10 MG tablet Take 1 tablet (10 mg total) by mouth daily as needed for allergies. 90 tablet 3    Dextromethorphan-guaiFENesin (MUCINEX DM MAXIMUM STRENGTH) 60-1200 MG TB12 Take 1 tablet by mouth 2 (two) times daily as needed. 60 tablet 0   DULoxetine (CYMBALTA) 60 MG capsule Take 1 capsule (60 mg total) by mouth daily. 90 capsule 1   ELIQUIS 5 MG TABS tablet Take 1 tablet (5 mg total) by mouth 2 (two) times daily. 60 tablet 0   fluticasone (FLONASE) 50 MCG/ACT nasal spray Place 2 sprays into both nostrils daily. 16 g 3   metoprolol succinate (TOPROL-XL) 50 MG 24 hr tablet Take 1 tablet (50 mg total) by mouth daily. 30 tablet 0   nitroGLYCERIN (NITROSTAT) 0.4 MG SL tablet Place 1 tablet (0.4 mg total) under the tongue every 5 (five) minutes x 3 doses as needed for chest pain. 25 tablet 0   timolol (TIMOPTIC) 0.5 % ophthalmic solution Place 1 drop into both eyes 2 (two) times daily. Instill 1 drop in each eye in the morning and one drop in each eye at night     No current facility-administered medications on file prior to visit.    Social History   Tobacco Use   Smoking status: Every Day    Packs/day: 0.50    Types: Cigarettes   Smokeless tobacco: Never  Vaping Use   Vaping Use: Never used  Substance Use Topics   Alcohol use: Yes    Comment: social   Drug use: No    Review of Systems  Constitutional:  Positive for fatigue. Negative for chills and fever.  Respiratory:  Positive for shortness of breath. Negative for cough.   Cardiovascular:  Negative for chest pain, palpitations and leg swelling.  Gastrointestinal:  Negative for nausea and vomiting.  Skin:  Positive for rash.  Psychiatric/Behavioral:  Positive for sleep disturbance. Negative for suicidal ideas. The patient is nervous/anxious.      Objective:    BP (!) 108/58 (BP Location: Left Arm, Patient Position: Sitting, Cuff Size: Large)   Pulse 71   Temp 98.2 F (36.8 C) (Oral)   Ht '5\' 9"'$  (1.753 m)   Wt 205 lb 6.4 oz (93.2 kg)   SpO2 96%   BMI 30.33 kg/m  BP Readings from Last 3 Encounters:  10/19/20 (!) 108/58   06/22/20 (!) 122/54  06/06/20 132/60   Wt Readings from Last 3 Encounters:  10/19/20 205 lb 6.4 oz (93.2 kg)  09/01/20 207 lb (93.9 kg)  08/01/20 215 lb (97.5 kg)    Physical Exam Vitals reviewed.  Constitutional:      Appearance: She is well-developed.  Eyes:     Conjunctiva/sclera: Conjunctivae normal.  Cardiovascular:     Rate and Rhythm: Normal rate and regular rhythm.     Pulses: Normal pulses.     Heart sounds: Normal heart sounds.  Pulmonary:     Effort:  Pulmonary effort is normal.     Breath sounds: Normal breath sounds. No wheezing, rhonchi or rales.  Skin:    General: Skin is warm and dry.     Comments: White  macerated appearance of skin in between fifth and fourth toe on right foot.  Neurological:     Mental Status: She is alert.  Psychiatric:        Speech: Speech normal.        Behavior: Behavior normal.        Thought Content: Thought content normal.       Assessment & Plan:   Problem List Items Addressed This Visit       Cardiovascular and Mediastinum   Atherosclerosis of aorta (HCC)    Chronic, stable.  Continue Lipitor '40mg'$ . LDL < 100         Musculoskeletal and Integument   Tinea pedis    Symptoms consistent with tinea.  Trial of clomitrazole.  Close follow up       Relevant Medications   Clotrimazole 1 % OINT     Other   B12 deficiency    Resume b12 injections monthly.       Depression, major, single episode, complete remission ([place]) - Primary    Suboptimal control.  Discussed this may be playing a role in fatigue.  Continue Cymbalta 60 mg.  Start trazodone 25 mg.    Advised to limit Xanax 0.25 mg  more as needed versus daily.  Advised patient how to wean from daily on AVS.        Relevant Medications   traZODone (DESYREL) 50 MG tablet   SOB (shortness of breath)    Chronic, unchanged.  Advised her to establish with pulmonology, referral again replaced.        Relevant Orders   Ambulatory referral to Pulmonology     I  have discontinued Samantha [name]. Colton's benzonatate, benzonatate, predniSONE, and doxycycline. I am also having her start on Clotrimazole and traZODone. Additionally, I am having her maintain her timolol, fluticasone, atorvastatin, metoprolol succinate, nitroGLYCERIN, amiodarone, amLODipine, Eliquis, DULoxetine, Mucinex DM Maximum Strength, cetirizine, albuterol, ALPRAZolam, and budesonide-formoterol.   Meds ordered this encounter  Medications   Clotrimazole 1 % OINT    Sig: Apply 1 application topically 2 (two) times daily.    Dispense:  56.7 g    Refill:  1    Order Specific Question:   Supervising Provider    Answer:   Deborra Medina L [[id number]]   traZODone (DESYREL) 50 MG tablet    Sig: Take 0.5-1 tablets (25-50 mg total) by mouth at bedtime as needed for sleep.    Dispense:  30 tablet    Refill:  3    Order Specific Question:   Supervising Provider    Answer:   Crecencio Mc [[id number]]    Return precautions given.   Risks, benefits, and alternatives of the medications and treatment plan prescribed today were discussed, and patient expressed understanding.   Education regarding symptom management and diagnosis given to patient on AVS.  Continue to follow with Burnard Hawthorne, FNP for routine health maintenance.   Velora Mediate and I agreed with plan.   Mable Paris, FNP

## 2020-10-19 NOTE — Assessment & Plan Note (Signed)
Chronic, unchanged.  Advised her to establish with pulmonology, referral again replaced.

## 2020-10-19 NOTE — Patient Instructions (Addendum)
Start taking xanax 0.'25mg'$  every other night for one week.  On the second week you may start taking Xanax every third night.  At that point, I would like for you to just use Xanax as needed once per day.  This medication is not scheduled it is really intended to be as needed.  Start trazodone '25mg'$  night-this medication is very helpful for sleep however may have to increase dose of follow-up   Start clotrimazole for suspected yeast infection (athlete's foot) on right toe.  Let me know if no improvement.

## 2020-10-19 NOTE — Assessment & Plan Note (Signed)
Suboptimal control.  Discussed this may be playing a role in fatigue.  Continue Cymbalta 60 mg.  Start trazodone 25 mg.    Advised to limit Xanax 0.25 mg  more as needed versus daily.  Advised patient how to wean from daily on AVS.

## 2020-10-19 NOTE — Addendum Note (Signed)
Addended by: Cheri Rous E on: 10/19/2020 03:01 PM   Modules accepted: Orders

## 2020-10-26 DIAGNOSIS — M9903 Segmental and somatic dysfunction of lumbar region: Secondary | ICD-10-CM | POA: Diagnosis not present

## 2020-10-26 DIAGNOSIS — M5416 Radiculopathy, lumbar region: Secondary | ICD-10-CM | POA: Diagnosis not present

## 2020-10-26 DIAGNOSIS — M9905 Segmental and somatic dysfunction of pelvic region: Secondary | ICD-10-CM | POA: Diagnosis not present

## 2020-10-26 DIAGNOSIS — M5136 Other intervertebral disc degeneration, lumbar region: Secondary | ICD-10-CM | POA: Diagnosis not present

## 2020-10-27 DIAGNOSIS — M5136 Other intervertebral disc degeneration, lumbar region: Secondary | ICD-10-CM | POA: Diagnosis not present

## 2020-10-27 DIAGNOSIS — M5416 Radiculopathy, lumbar region: Secondary | ICD-10-CM | POA: Diagnosis not present

## 2020-10-27 DIAGNOSIS — M9903 Segmental and somatic dysfunction of lumbar region: Secondary | ICD-10-CM | POA: Diagnosis not present

## 2020-10-27 DIAGNOSIS — M9905 Segmental and somatic dysfunction of pelvic region: Secondary | ICD-10-CM | POA: Diagnosis not present

## 2020-10-28 DIAGNOSIS — M5416 Radiculopathy, lumbar region: Secondary | ICD-10-CM | POA: Diagnosis not present

## 2020-10-28 DIAGNOSIS — M9905 Segmental and somatic dysfunction of pelvic region: Secondary | ICD-10-CM | POA: Diagnosis not present

## 2020-10-28 DIAGNOSIS — M9903 Segmental and somatic dysfunction of lumbar region: Secondary | ICD-10-CM | POA: Diagnosis not present

## 2020-10-28 DIAGNOSIS — M5136 Other intervertebral disc degeneration, lumbar region: Secondary | ICD-10-CM | POA: Diagnosis not present

## 2020-10-31 DIAGNOSIS — M9905 Segmental and somatic dysfunction of pelvic region: Secondary | ICD-10-CM | POA: Diagnosis not present

## 2020-10-31 DIAGNOSIS — M5416 Radiculopathy, lumbar region: Secondary | ICD-10-CM | POA: Diagnosis not present

## 2020-10-31 DIAGNOSIS — M5136 Other intervertebral disc degeneration, lumbar region: Secondary | ICD-10-CM | POA: Diagnosis not present

## 2020-10-31 DIAGNOSIS — M9903 Segmental and somatic dysfunction of lumbar region: Secondary | ICD-10-CM | POA: Diagnosis not present

## 2020-11-02 DIAGNOSIS — M5136 Other intervertebral disc degeneration, lumbar region: Secondary | ICD-10-CM | POA: Diagnosis not present

## 2020-11-02 DIAGNOSIS — M9905 Segmental and somatic dysfunction of pelvic region: Secondary | ICD-10-CM | POA: Diagnosis not present

## 2020-11-02 DIAGNOSIS — M9903 Segmental and somatic dysfunction of lumbar region: Secondary | ICD-10-CM | POA: Diagnosis not present

## 2020-11-02 DIAGNOSIS — M5416 Radiculopathy, lumbar region: Secondary | ICD-10-CM | POA: Diagnosis not present

## 2020-11-04 DIAGNOSIS — M9905 Segmental and somatic dysfunction of pelvic region: Secondary | ICD-10-CM | POA: Diagnosis not present

## 2020-11-04 DIAGNOSIS — M9903 Segmental and somatic dysfunction of lumbar region: Secondary | ICD-10-CM | POA: Diagnosis not present

## 2020-11-04 DIAGNOSIS — M5136 Other intervertebral disc degeneration, lumbar region: Secondary | ICD-10-CM | POA: Diagnosis not present

## 2020-11-04 DIAGNOSIS — M5416 Radiculopathy, lumbar region: Secondary | ICD-10-CM | POA: Diagnosis not present

## 2020-11-05 ENCOUNTER — Other Ambulatory Visit: Payer: Self-pay | Admitting: Family

## 2020-11-05 DIAGNOSIS — F325 Major depressive disorder, single episode, in full remission: Secondary | ICD-10-CM

## 2020-11-07 DIAGNOSIS — M9905 Segmental and somatic dysfunction of pelvic region: Secondary | ICD-10-CM | POA: Diagnosis not present

## 2020-11-07 DIAGNOSIS — M5416 Radiculopathy, lumbar region: Secondary | ICD-10-CM | POA: Diagnosis not present

## 2020-11-07 DIAGNOSIS — M5136 Other intervertebral disc degeneration, lumbar region: Secondary | ICD-10-CM | POA: Diagnosis not present

## 2020-11-07 DIAGNOSIS — M9903 Segmental and somatic dysfunction of lumbar region: Secondary | ICD-10-CM | POA: Diagnosis not present

## 2020-11-09 DIAGNOSIS — M5416 Radiculopathy, lumbar region: Secondary | ICD-10-CM | POA: Diagnosis not present

## 2020-11-09 DIAGNOSIS — M5136 Other intervertebral disc degeneration, lumbar region: Secondary | ICD-10-CM | POA: Diagnosis not present

## 2020-11-09 DIAGNOSIS — M9903 Segmental and somatic dysfunction of lumbar region: Secondary | ICD-10-CM | POA: Diagnosis not present

## 2020-11-09 DIAGNOSIS — M9905 Segmental and somatic dysfunction of pelvic region: Secondary | ICD-10-CM | POA: Diagnosis not present

## 2020-11-10 ENCOUNTER — Other Ambulatory Visit: Payer: Self-pay | Admitting: Family

## 2020-11-10 DIAGNOSIS — F325 Major depressive disorder, single episode, in full remission: Secondary | ICD-10-CM

## 2020-11-11 DIAGNOSIS — M5416 Radiculopathy, lumbar region: Secondary | ICD-10-CM | POA: Diagnosis not present

## 2020-11-11 DIAGNOSIS — M9905 Segmental and somatic dysfunction of pelvic region: Secondary | ICD-10-CM | POA: Diagnosis not present

## 2020-11-11 DIAGNOSIS — M5136 Other intervertebral disc degeneration, lumbar region: Secondary | ICD-10-CM | POA: Diagnosis not present

## 2020-11-11 DIAGNOSIS — M9903 Segmental and somatic dysfunction of lumbar region: Secondary | ICD-10-CM | POA: Diagnosis not present

## 2020-11-14 DIAGNOSIS — M5416 Radiculopathy, lumbar region: Secondary | ICD-10-CM | POA: Diagnosis not present

## 2020-11-14 DIAGNOSIS — M5136 Other intervertebral disc degeneration, lumbar region: Secondary | ICD-10-CM | POA: Diagnosis not present

## 2020-11-14 DIAGNOSIS — M9903 Segmental and somatic dysfunction of lumbar region: Secondary | ICD-10-CM | POA: Diagnosis not present

## 2020-11-14 DIAGNOSIS — M9905 Segmental and somatic dysfunction of pelvic region: Secondary | ICD-10-CM | POA: Diagnosis not present

## 2020-11-16 DIAGNOSIS — M5136 Other intervertebral disc degeneration, lumbar region: Secondary | ICD-10-CM | POA: Diagnosis not present

## 2020-11-16 DIAGNOSIS — M5416 Radiculopathy, lumbar region: Secondary | ICD-10-CM | POA: Diagnosis not present

## 2020-11-16 DIAGNOSIS — M9905 Segmental and somatic dysfunction of pelvic region: Secondary | ICD-10-CM | POA: Diagnosis not present

## 2020-11-16 DIAGNOSIS — M9903 Segmental and somatic dysfunction of lumbar region: Secondary | ICD-10-CM | POA: Diagnosis not present

## 2020-11-17 DIAGNOSIS — M5416 Radiculopathy, lumbar region: Secondary | ICD-10-CM | POA: Diagnosis not present

## 2020-11-17 DIAGNOSIS — M9903 Segmental and somatic dysfunction of lumbar region: Secondary | ICD-10-CM | POA: Diagnosis not present

## 2020-11-17 DIAGNOSIS — M9905 Segmental and somatic dysfunction of pelvic region: Secondary | ICD-10-CM | POA: Diagnosis not present

## 2020-11-17 DIAGNOSIS — M5136 Other intervertebral disc degeneration, lumbar region: Secondary | ICD-10-CM | POA: Diagnosis not present

## 2020-11-22 DIAGNOSIS — M9905 Segmental and somatic dysfunction of pelvic region: Secondary | ICD-10-CM | POA: Diagnosis not present

## 2020-11-22 DIAGNOSIS — M9903 Segmental and somatic dysfunction of lumbar region: Secondary | ICD-10-CM | POA: Diagnosis not present

## 2020-11-22 DIAGNOSIS — M5136 Other intervertebral disc degeneration, lumbar region: Secondary | ICD-10-CM | POA: Diagnosis not present

## 2020-11-22 DIAGNOSIS — M5416 Radiculopathy, lumbar region: Secondary | ICD-10-CM | POA: Diagnosis not present

## 2020-11-23 ENCOUNTER — Other Ambulatory Visit: Payer: Self-pay

## 2020-11-23 ENCOUNTER — Encounter: Payer: Self-pay | Admitting: Family

## 2020-11-23 ENCOUNTER — Ambulatory Visit (INDEPENDENT_AMBULATORY_CARE_PROVIDER_SITE_OTHER): Payer: PPO | Admitting: Family

## 2020-11-23 VITALS — BP 118/65 | HR 62 | Temp 97.9°F | Ht 69.0 in | Wt 209.8 lb

## 2020-11-23 DIAGNOSIS — F325 Major depressive disorder, single episode, in full remission: Secondary | ICD-10-CM

## 2020-11-23 DIAGNOSIS — Z23 Encounter for immunization: Secondary | ICD-10-CM | POA: Diagnosis not present

## 2020-11-23 DIAGNOSIS — R0602 Shortness of breath: Secondary | ICD-10-CM | POA: Diagnosis not present

## 2020-11-23 NOTE — Assessment & Plan Note (Signed)
denies shortness of breath during her visit today.  She is not labored in her speech.  Shortness of breath only occurs when she is bending forward such as with tying her shoes.  Discussed this is being more physiologic.  However as she is a longstanding smoker, and I have concerns for underlying COPD.  Advised her to take Symbicort daily as a preventative medication to see if notices any improvement.  I emphasized the importance of establishing with pulmonology for more formal evaluation.  I provided her with the phone number for [facility] pulmonology and she will call to schedule an appointment.

## 2020-11-23 NOTE — Patient Instructions (Signed)
Call [facility] pulmonology to schedule an appointment  [phone]  Trial of symbicort EVERY DAY to see if breathing improves.   Nice to see you!

## 2020-11-23 NOTE — Progress Notes (Signed)
Subjective:    Patient ID: Samantha [name], female    DOB: 02/21/1941, 80 y.o.   MRN: [id number]  CC: Samantha [name] is a 80 y.o. female who presents today for follow up for depression.   HPI: She is feeling best 'Ive felt in a long tme.'    SOB is unchanged. Only present when bending over. She is only using symbicort prn.  SOB when bending  over such as tying shoes. No SOB with walking or talking. No CP, left arm numbness.   Smokes 5-6 cig per day   Depression-compliant with Cymbalta 60 mg; she stopped trazodone 25 mg as started 6 weeks ago has kept her awake.  Compliant with as needed Xanax 0.25 mg qhs. No alcohol use.  Depression has improved and she doesn't feel is a problem any more.  She feels the depression at that time was circumstantial.  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  HISTORY:  Past Medical History:  Diagnosis Date   A-fib [facility])    Arthritis    Chronic back pain    COPD (chronic obstructive pulmonary disease) ([place])    Coronary artery disease    COVID-19    08/22/20 likely contracted late 07/2020   Glaucoma    Hearing aid worn    High cholesterol    Hypertension    Lymphadenopathy 01/25/2016   Myocardial infarction ([place])    2000   Non Hodgkin's lymphoma ([place])    Wears dentures    " Top plate"   Wears glasses    Past Surgical History:  Procedure Laterality Date   CARDIAC CATHETERIZATION     CATARACT EXTRACTION W/ INTRAOCULAR LENS IMPLANT     CHOLECYSTECTOMY     COLONOSCOPY W/ BIOPSIES AND POLYPECTOMY     CORONARY STENT PLACEMENT     INGUINAL LYMPH NODE BIOPSY Right 01/25/2016   Procedure: EXCISION DEEP RIGHT INGUINAL LYMPH NODE BIOPSY WITH ULTRA SOUND;  Surgeon: Fanny Skates, MD;  Location: [place];  Service: General;  Laterality: Right;   Family History  Problem Relation Age of Onset   Hypertension Mother    Stroke Mother    Heart disease Father    Heart disease Other     Allergies: Hydrocodone and Oxycodone Current Outpatient Medications on File Prior to  Visit  Medication Sig Dispense Refill   albuterol (VENTOLIN HFA) 108 (90 Base) MCG/ACT inhaler Inhale 1-2 puffs into the lungs every 6 (six) hours as needed for wheezing or shortness of breath. 18 each 2   ALPRAZolam (XANAX) 0.25 MG tablet Take 1 tablet (0.25 mg total) by mouth daily as needed for anxiety. 30 tablet 2   amiodarone (PACERONE) 200 MG tablet Take 0.5 tablets (100 mg total) by mouth every morning. 90 tablet 0   amLODipine (NORVASC) 5 MG tablet Take 0.5 tablets (2.5 mg total) by mouth daily. 30 tablet 0   atorvastatin (LIPITOR) 40 MG tablet Take 1 tablet (40 mg total) by mouth every morning. 30 tablet 0   budesonide-formoterol (SYMBICORT) 160-4.5 MCG/ACT inhaler Inhale 2 puffs into the lungs 2 (two) times daily. Rinse mouth stop 80-4.5 1 each 5   cetirizine (ZYRTEC) 10 MG tablet Take 1 tablet (10 mg total) by mouth daily as needed for allergies. 90 tablet 3   Clotrimazole 1 % OINT Apply 1 application topically 2 (two) times daily. 56.7 g 1   DULoxetine (CYMBALTA) 60 MG capsule TAKE 1 CAPSULE BY MOUTH EVERY DAY 90 capsule 1   ELIQUIS 5 MG TABS tablet Take  1 tablet (5 mg total) by mouth 2 (two) times daily. 60 tablet 0   fluticasone (FLONASE) 50 MCG/ACT nasal spray Place 2 sprays into both nostrils daily. 16 g 3   metoprolol succinate (TOPROL-XL) 50 MG 24 hr tablet Take 1 tablet (50 mg total) by mouth daily. 30 tablet 0   nitroGLYCERIN (NITROSTAT) 0.4 MG SL tablet Place 1 tablet (0.4 mg total) under the tongue every 5 (five) minutes x 3 doses as needed for chest pain. 25 tablet 0   timolol (TIMOPTIC) 0.5 % ophthalmic solution Place 1 drop into both eyes 2 (two) times daily. Instill 1 drop in each eye in the morning and one drop in each eye at night     No current facility-administered medications on file prior to visit.    Social History   Tobacco Use   Smoking status: Every Day    Packs/day: 0.50    Types: Cigarettes   Smokeless tobacco: Never  Vaping Use   Vaping Use: Never  used  Substance Use Topics   Alcohol use: Yes    Comment: social   Drug use: No    Review of Systems  Constitutional:  Negative for chills and fever.  Respiratory:  Positive for shortness of breath. Negative for cough and wheezing.   Cardiovascular:  Negative for chest pain, palpitations and leg swelling.  Gastrointestinal:  Negative for nausea and vomiting.  Psychiatric/Behavioral:  The patient is not nervous/anxious.      Objective:    BP 118/65 (BP Location: Left Arm, Patient Position: Sitting, Cuff Size: Large)   Pulse 62   Temp 97.9 F (36.6 C) (Oral)   Ht '5\' 9"'$  (1.753 m)   Wt 209 lb 12.8 oz (95.2 kg)   SpO2 96%   BMI 30.98 kg/m  BP Readings from Last 3 Encounters:  11/23/20 118/65  10/19/20 (!) 108/58  06/22/20 (!) 122/54   Wt Readings from Last 3 Encounters:  11/23/20 209 lb 12.8 oz (95.2 kg)  10/19/20 205 lb 6.4 oz (93.2 kg)  09/01/20 207 lb (93.9 kg)    Physical Exam Vitals reviewed.  Constitutional:      Appearance: She is well-developed.  Eyes:     Conjunctiva/sclera: Conjunctivae normal.  Cardiovascular:     Rate and Rhythm: Normal rate and regular rhythm.     Pulses: Normal pulses.     Heart sounds: Normal heart sounds.  Pulmonary:     Effort: Pulmonary effort is normal.     Breath sounds: Normal breath sounds. No wheezing, rhonchi or rales.  Skin:    General: Skin is warm and dry.  Neurological:     Mental Status: She is alert.  Psychiatric:        Speech: Speech normal.        Behavior: Behavior normal.        Thought Content: Thought content normal.       Assessment & Plan:   Problem List Items Addressed This Visit       Other   Depression, major, single episode, complete remission ([place])     improved and symptoms returned to baseline.  Patient politely declines any further evaluation or medication changes.  She would like to continue Cymbalta 60 mg.  She is no longer on trazodone.  She may continue Xanax 0.25 mg daily as needed       SOB (shortness of breath)    denies shortness of breath during her visit today.  She is not labored in her speech.  Shortness of breath only occurs when she is bending forward such as with tying her shoes.  Discussed this is being more physiologic.  However as she is a longstanding smoker, and I have concerns for underlying COPD.  Advised her to take Symbicort daily as a preventative medication to see if notices any improvement.  I emphasized the importance of establishing with pulmonology for more formal evaluation.  I provided her with the phone number for [facility] pulmonology and she will call to schedule an appointment.      Other Visit Diagnoses     Need for immunization against influenza    -  Primary   Relevant Orders   Flu Vaccine QUAD High Dose(Fluad) (Completed)        I have discontinued Van Clines. Carriger's Mucinex DM Maximum Strength and traZODone. I am also having her maintain her timolol, fluticasone, atorvastatin, metoprolol succinate, nitroGLYCERIN, amiodarone, amLODipine, Eliquis, cetirizine, albuterol, ALPRAZolam, budesonide-formoterol, Clotrimazole, and DULoxetine.   No orders of the defined types were placed in this encounter.   Return precautions given.   Risks, benefits, and alternatives of the medications and treatment plan prescribed today were discussed, and patient expressed understanding.   Education regarding symptom management and diagnosis given to patient on AVS.  Continue to follow with Burnard Hawthorne, FNP for routine health maintenance.   Velora Mediate and I agreed with plan.   Mable Paris, FNP

## 2020-11-23 NOTE — Assessment & Plan Note (Signed)
improved and symptoms returned to baseline.  Patient politely declines any further evaluation or medication changes.  She would like to continue Cymbalta 60 mg.  She is no longer on trazodone.  She may continue Xanax 0.25 mg daily as needed

## 2020-11-24 DIAGNOSIS — M9905 Segmental and somatic dysfunction of pelvic region: Secondary | ICD-10-CM | POA: Diagnosis not present

## 2020-11-24 DIAGNOSIS — M5416 Radiculopathy, lumbar region: Secondary | ICD-10-CM | POA: Diagnosis not present

## 2020-11-24 DIAGNOSIS — M9903 Segmental and somatic dysfunction of lumbar region: Secondary | ICD-10-CM | POA: Diagnosis not present

## 2020-11-24 DIAGNOSIS — M5136 Other intervertebral disc degeneration, lumbar region: Secondary | ICD-10-CM | POA: Diagnosis not present

## 2020-11-28 DIAGNOSIS — M9905 Segmental and somatic dysfunction of pelvic region: Secondary | ICD-10-CM | POA: Diagnosis not present

## 2020-11-28 DIAGNOSIS — M9903 Segmental and somatic dysfunction of lumbar region: Secondary | ICD-10-CM | POA: Diagnosis not present

## 2020-11-28 DIAGNOSIS — M5136 Other intervertebral disc degeneration, lumbar region: Secondary | ICD-10-CM | POA: Diagnosis not present

## 2020-11-28 DIAGNOSIS — M5416 Radiculopathy, lumbar region: Secondary | ICD-10-CM | POA: Diagnosis not present

## 2020-12-01 DIAGNOSIS — M5416 Radiculopathy, lumbar region: Secondary | ICD-10-CM | POA: Diagnosis not present

## 2020-12-01 DIAGNOSIS — M5136 Other intervertebral disc degeneration, lumbar region: Secondary | ICD-10-CM | POA: Diagnosis not present

## 2020-12-01 DIAGNOSIS — M9903 Segmental and somatic dysfunction of lumbar region: Secondary | ICD-10-CM | POA: Diagnosis not present

## 2020-12-01 DIAGNOSIS — M9905 Segmental and somatic dysfunction of pelvic region: Secondary | ICD-10-CM | POA: Diagnosis not present

## 2020-12-06 DIAGNOSIS — M9905 Segmental and somatic dysfunction of pelvic region: Secondary | ICD-10-CM | POA: Diagnosis not present

## 2020-12-06 DIAGNOSIS — M5416 Radiculopathy, lumbar region: Secondary | ICD-10-CM | POA: Diagnosis not present

## 2020-12-06 DIAGNOSIS — M5136 Other intervertebral disc degeneration, lumbar region: Secondary | ICD-10-CM | POA: Diagnosis not present

## 2020-12-06 DIAGNOSIS — M9903 Segmental and somatic dysfunction of lumbar region: Secondary | ICD-10-CM | POA: Diagnosis not present

## 2020-12-07 ENCOUNTER — Other Ambulatory Visit: Payer: Self-pay | Admitting: Internal Medicine

## 2020-12-07 DIAGNOSIS — F419 Anxiety disorder, unspecified: Secondary | ICD-10-CM

## 2020-12-08 DIAGNOSIS — M9905 Segmental and somatic dysfunction of pelvic region: Secondary | ICD-10-CM | POA: Diagnosis not present

## 2020-12-08 DIAGNOSIS — M5136 Other intervertebral disc degeneration, lumbar region: Secondary | ICD-10-CM | POA: Diagnosis not present

## 2020-12-08 DIAGNOSIS — M9903 Segmental and somatic dysfunction of lumbar region: Secondary | ICD-10-CM | POA: Diagnosis not present

## 2020-12-08 DIAGNOSIS — M5416 Radiculopathy, lumbar region: Secondary | ICD-10-CM | POA: Diagnosis not present

## 2020-12-12 ENCOUNTER — Other Ambulatory Visit: Payer: Self-pay

## 2020-12-12 ENCOUNTER — Ambulatory Visit: Payer: PPO | Admitting: Cardiovascular Disease

## 2020-12-12 ENCOUNTER — Encounter: Payer: Self-pay | Admitting: Cardiovascular Disease

## 2020-12-12 VITALS — BP 140/62 | HR 59 | Ht 69.0 in | Wt 208.0 lb

## 2020-12-12 DIAGNOSIS — I25118 Atherosclerotic heart disease of native coronary artery with other forms of angina pectoris: Secondary | ICD-10-CM | POA: Diagnosis not present

## 2020-12-12 DIAGNOSIS — I480 Paroxysmal atrial fibrillation: Secondary | ICD-10-CM

## 2020-12-12 DIAGNOSIS — I10 Essential (primary) hypertension: Secondary | ICD-10-CM

## 2020-12-12 DIAGNOSIS — I700 Atherosclerosis of aorta: Secondary | ICD-10-CM

## 2020-12-12 DIAGNOSIS — E782 Mixed hyperlipidemia: Secondary | ICD-10-CM | POA: Diagnosis not present

## 2020-12-12 NOTE — Progress Notes (Signed)
Cardiology Office Note  Date:  12/12/2020   ID:  Samantha OBERRY, DOB [birth year], MRN [id number]  PCP:  Burnard Hawthorne, FNP   Chief Complaint  Patient presents with   6 month follow up     "Doing well." Medications reviewed by the patient verbally.     HPI:  Ms Shanece [name] is 80 yo woman with PMH of  Follicular lymphoma, chemo Atrial fibrillation Smokes 1/2 ppd CAD prior stent, 2001, mid RCA Back surgery, lower Previously seen in [place] Presents for f/u of her coronary disease, stable angina, paroxysmal atrial fibrillation  In follow-up today reports she is doing relatively well Had infection of Covid 07/2020 Seemed to recover well  Activity limited by sciatic pain, walks with a cane Going to chiropractor Lives alone, does ADLs, history of cleaning houses No regular exercise Smokes 5 to 6 cigarettes/day.  Enjoys smoking in the morning with her coffee  EKG personally reviewed by myself on todays visit Normal sinus rhythm rate 59 bpm nonspecific ST abnormality, old inferior MI  Other past medical history reviewed Cardiac cath: 2001,  Had left arm pain, chest pain Stent to RCA Has had cath since then, details unclear ("couple times")  Echo 2017:normal EF  CT chest 2019 for lymphoma 1. Stable mild retroperitoneal adenopathy, compatible with treated lymphoma. No new or progressive adenopathy. No new sites of disease. Normal size spleen. 2. Three-vessel coronary atherosclerosis. 3.  Aortic Atherosclerosis   Stress test 2015 Small relatively matched area of attenuation involving the  inferior wall of the left ventricle without associated regional wall  motion abnormality. No definite scintigraphic evidence of prior  infarction or pharmacologically induced ischemia.  2. Normal wall motion. Ejection fraction - 54%. (Previous ejection  fraction - 68%).  In 2020 Total chol 118, LDL 58 A1C 6.0   PMH:   has a past medical history of A-fib ([place]), Arthritis, Chronic back  pain, COPD (chronic obstructive pulmonary disease) ([place]), Coronary artery disease, COVID-19, Glaucoma, Hearing aid worn, High cholesterol, Hypertension, Lymphadenopathy (01/25/2016), Myocardial infarction ([place]), Non Hodgkin's lymphoma ([place]), Wears dentures, and Wears glasses.  PSH:    Past Surgical History:  Procedure Laterality Date   CARDIAC CATHETERIZATION     CATARACT EXTRACTION W/ INTRAOCULAR LENS IMPLANT     CHOLECYSTECTOMY     COLONOSCOPY W/ BIOPSIES AND POLYPECTOMY     CORONARY STENT PLACEMENT     INGUINAL LYMPH NODE BIOPSY Right 01/25/2016   Procedure: EXCISION DEEP RIGHT INGUINAL LYMPH NODE BIOPSY WITH ULTRA SOUND;  Surgeon: Fanny Skates, MD;  Location: [place];  Service: General;  Laterality: Right;    Current Outpatient Medications  Medication Sig Dispense Refill   albuterol (VENTOLIN HFA) 108 (90 Base) MCG/ACT inhaler Inhale 1-2 puffs into the lungs every 6 (six) hours as needed for wheezing or shortness of breath. 18 each 2   ALPRAZolam (XANAX) 0.25 MG tablet TAKE 1 TABLET BY MOUTH DAILY AS NEEDED FOR ANXIETY 30 tablet 2   amiodarone (PACERONE) 200 MG tablet Take 0.5 tablets (100 mg total) by mouth every morning. 90 tablet 0   amLODipine (NORVASC) 5 MG tablet Take 0.5 tablets (2.5 mg total) by mouth daily. 30 tablet 0   atorvastatin (LIPITOR) 40 MG tablet Take 1 tablet (40 mg total) by mouth every morning. 30 tablet 0   budesonide-formoterol (SYMBICORT) 160-4.5 MCG/ACT inhaler Inhale 2 puffs into the lungs 2 (two) times daily. Rinse mouth stop 80-4.5 1 each 5   cetirizine (ZYRTEC) 10 MG tablet Take 1 tablet (  10 mg total) by mouth daily as needed for allergies. 90 tablet 3   Clotrimazole 1 % OINT Apply 1 application topically 2 (two) times daily. 56.7 g 1   DULoxetine (CYMBALTA) 60 MG capsule TAKE 1 CAPSULE BY MOUTH EVERY DAY 90 capsule 1   ELIQUIS 5 MG TABS tablet Take 1 tablet (5 mg total) by mouth 2 (two) times daily. 60 tablet 0   fluticasone (FLONASE) 50 MCG/ACT nasal spray  Place 2 sprays into both nostrils daily. 16 g 3   metoprolol succinate (TOPROL-XL) 50 MG 24 hr tablet Take 1 tablet (50 mg total) by mouth daily. 30 tablet 0   nitroGLYCERIN (NITROSTAT) 0.4 MG SL tablet Place 1 tablet (0.4 mg total) under the tongue every 5 (five) minutes x 3 doses as needed for chest pain. 25 tablet 0   timolol (TIMOPTIC) 0.5 % ophthalmic solution Place 1 drop into both eyes 2 (two) times daily. Instill 1 drop in each eye in the morning and one drop in each eye at night     No current facility-administered medications for this visit.     Allergies:   Hydrocodone and Oxycodone   Social History:  The patient  reports that she has been smoking cigarettes. She has been smoking an average of .5 packs per day. She has never used smokeless tobacco. She reports current alcohol use. She reports that she does not use drugs.   Family History:   family history includes Heart disease in her father and another family member; Hypertension in her mother; Stroke in her mother.    Review of Systems: Review of Systems  Constitutional: Negative.   HENT: Negative.    Respiratory: Negative.    Cardiovascular: Negative.   Gastrointestinal: Negative.   Musculoskeletal:  Positive for back pain and joint pain.  Neurological: Negative.   Psychiatric/Behavioral: Negative.    All other systems reviewed and are negative.   PHYSICAL EXAM: VS:  BP 140/62 (BP Location: Left Arm, Patient Position: Sitting, Cuff Size: Normal)   Pulse (!) 59   Ht 5\' 9"  (1.753 m)   Wt 208 lb (94.3 kg)   SpO2 97%   BMI 30.72 kg/m  , BMI Body mass index is 30.72 kg/m. Constitutional:  oriented to person, place, and time. No distress.  HENT:  Head: Grossly normal Eyes:  no discharge. No scleral icterus.  Neck: No JVD, no carotid bruits  Cardiovascular: Regular rate and rhythm, no murmurs appreciated Pulmonary/Chest: Clear to auscultation bilaterally, no wheezes or rails Abdominal: Soft.  no distension.  no  tenderness.  Musculoskeletal: Normal range of motion Neurological:  normal muscle tone. Coordination normal. No atrophy Skin: Skin warm and dry Psychiatric: normal affect, pleasant  Recent Labs: 06/22/2020: ALT 12; BUN 14; Potassium 4.4; Sodium 142; TSH 2.88 07/26/2020: Hemoglobin 12.8; Platelets 210.0 08/22/2020: Creatinine, Ser 0.70    Lipid Panel Lab Results  Component Value Date   CHOL 116 06/22/2020   HDL 40.40 06/22/2020   LDLCALC 50 06/22/2020   TRIG 129.0 06/22/2020      Wt Readings from Last 3 Encounters:  12/12/20 208 lb (94.3 kg)  11/23/20 209 lb 12.8 oz (95.2 kg)  10/19/20 205 lb 6.4 oz (93.2 kg)     ASSESSMENT AND PLAN:  Problem List Items Addressed This Visit       Cardiology Problems   Intermittent atrial fibrillation ([place]) - Primary   Relevant Orders   EKG 12-Lead   Other Visit Diagnoses     Coronary artery disease  of native artery of native heart with stable angina pectoris ([place])       Relevant Orders   EKG 12-Lead     CAD with stable angina Currently with no symptoms of angina. No further workup at this time. Continue current medication regimen. Still smoking,  5-6 cigs a day  Hyperlipidemia Cholesterol is at goal on the current lipid regimen. No changes to the medications were made.  HTN Blood pressure is well controlled on today's visit. No changes made to the medications.  Atrial fib maintaining NSR, Amiodarone, metoprolol No breakthrough    Total encounter time more than 25 minutes  Greater than 50% was spent in counseling and coordination of care with the patient    Signed, Esmond Plants, M.D., Ph.D. [place], [place]

## 2020-12-12 NOTE — Patient Instructions (Addendum)
Medication Instructions:  No changes  If you need a refill on your cardiac medications before your next appointment, please call your pharmacy.   Lab work: No new labs needed  Testing/Procedures: No new testing needed  Follow-Up: At [facility], you and your health needs are our priority.  As part of our continuing mission to provide you with exceptional heart care, we have created designated Provider Care Teams.  These Care Teams include your primary Cardiologist (physician) and Advanced Practice Providers (APPs -  Physician Assistants and Nurse Practitioners) who all work together to provide you with the care you need, when you need it.  You will need a follow up appointment in 12 months  Providers on your designated Care Team:   Christopher Berge, NP Ryan Dunn, PA-C Jacquelyn Visser, PA-C Cadence Furth, PA-C  COVID-19 Vaccine Information can be found at: [email or web address] For questions related to vaccine distribution or appointments, please email [email or web address] or call [phone].    

## 2020-12-13 ENCOUNTER — Ambulatory Visit: Payer: PPO | Admitting: Internal Medicine

## 2020-12-13 ENCOUNTER — Encounter: Payer: Self-pay | Admitting: Internal Medicine

## 2020-12-13 VITALS — BP 136/78 | HR 64 | Temp 97.7°F | Ht 69.0 in | Wt 208.6 lb

## 2020-12-13 DIAGNOSIS — M5136 Other intervertebral disc degeneration, lumbar region: Secondary | ICD-10-CM | POA: Diagnosis not present

## 2020-12-13 DIAGNOSIS — M9903 Segmental and somatic dysfunction of lumbar region: Secondary | ICD-10-CM | POA: Diagnosis not present

## 2020-12-13 DIAGNOSIS — M9905 Segmental and somatic dysfunction of pelvic region: Secondary | ICD-10-CM | POA: Diagnosis not present

## 2020-12-13 DIAGNOSIS — R0602 Shortness of breath: Secondary | ICD-10-CM

## 2020-12-13 DIAGNOSIS — F17210 Nicotine dependence, cigarettes, uncomplicated: Secondary | ICD-10-CM

## 2020-12-13 DIAGNOSIS — J449 Chronic obstructive pulmonary disease, unspecified: Secondary | ICD-10-CM

## 2020-12-13 DIAGNOSIS — M5416 Radiculopathy, lumbar region: Secondary | ICD-10-CM | POA: Diagnosis not present

## 2020-12-13 NOTE — Assessment & Plan Note (Addendum)
Active smoker -  12/13/2020 rx symbicort 160 up to 2 puffs every 12 hours pending f/u pfts   - The proper method of use, as well as anticipated side effects, of a metered-dose inhaler were discussed and demonstrated to the patient using teach back method.   Prn symbicort approp for now since really more of a mild AB patter and  Based on two studies from [place]; 20 p 1865 (2018) and 380 : p2020-30 (2019) in pts with mild asthma it is reasonable to use   symbicort   2bid "prn" flare in this setting but I emphasized this was only shown with symbicort and takes advantage of the rapid onset of action but is not the same as "rescue therapy" but can be stopped once the acute symptoms have resolved and the need for rescue has been minimized (< 2 x weekly)    Note using timolol eyedrops and if AB not well controlled on prn symb 160 would first try off the timolol and on bisoptic   >>> f/u with pfts in 3 m    Each maintenance medication was reviewed in detail including emphasizing most importantly the difference between maintenance and prns and under what circumstances the prns are to be triggered using an action plan format where appropriate.  Total time for H and P, chart review, counseling, reviewing hfa device(s) and generating customized AVS unique to this office visit / same day charting = 45 min

## 2020-12-13 NOTE — Assessment & Plan Note (Signed)
4-5 min discussion re active cigarette smoking in addition to office E&M  Ask about tobacco use:  onging  I reviewed the Fletcher curve with the patient that basically indicates  if you quit smoking when your best day FEV1 is still well preserved (as is clearly  the case here)  it is highly unlikely you will progress to severe disease and informed the patient there was  no medication on the market that has proven to alter the curve/ its downward trajectory  or the likelihood of progression of their disease(unlike other chronic medical conditions such as atheroclerosis where we do think we can change the natural hx with risk reducing meds)    Therefore stopping smoking and maintaining abstinence are  the most important aspects of her care, not choice of inhalers or for that matter, doctors. Treatment other than smoking cessation  is entirely directed by severity of symptoms and focused also on reducing exacerbations, not attempting to change the natural history of the disease.   Assess willingness:  Not committed at this point Assist in quit attempt:  Per PCP when ready Arrange follow up:   Follow up per Primary Care planned

## 2020-12-13 NOTE — Progress Notes (Signed)
Samantha [name], female    DOB: [birth year],    MRN: [id number]   Brief patient profile:  9 yowf  active smoker  referred to pulmonary clinic in [facility]  12/13/2020 by Mable Paris  for doe  ? Copd / no baseline pfts        Baseline = not really bothered by breathing much and not on any inhalers then end of May covid 2022 and much energy but denies limited by sob and not using any inhalers   History of Present Illness  12/13/2020  Pulmonary/ 1st office eval/ Gianpaolo Mindel / [employer] / on timolol eyedrops/ on amiodarone x years ? p MI 2000  Chief Complaint  Patient presents with   pulmonary consult    Per Mable Paris, NP. Hx of COPD. Sob with exertion and wheezing.   Dyspnea:  not really limited but does not push herself Cough: some noct wheeze but no cough  Sleep: flat bed one pillow SABA use: sev days prior to OV    No obvious day to day or daytime variability or assoc excess/ purulent sputum or mucus plugs or hemoptysis or cp or chest tightness,  or overt sinus or hb symptoms.   Sleeping  without nocturnal  or early am exacerbation  of respiratory  c/o's or need for noct saba. Also denies any obvious fluctuation of symptoms with weather or environmental changes or other aggravating or alleviating factors except as outlined above   No unusual exposure hx or h/o childhood pna/ asthma or knowledge of premature birth.  Current Allergies, Complete Past Medical History, Past Surgical History, Family History, and Social History were reviewed in [employer] record.  ROS  The following are not active complaints unless bolded Hoarseness, sore throat, dysphagia, dental problems, itching, sneezing,  nasal congestion or discharge of excess mucus or purulent secretions, ear ache,   fever, chills, sweats, unintended wt loss or wt gain, classically pleuritic or exertional cp,  orthopnea pnd or arm/hand swelling  or leg swelling, presyncope, palpitations, abdominal  pain, anorexia, nausea, vomiting, diarrhea  or change in bowel habits or change in bladder habits, change in stools or change in urine, dysuria, hematuria,  rash, arthralgias, visual complaints, headache, numbness, weakness or ataxia or problems with walking(cane)  or coordination,  change in mood or  memory.           Past Medical History:  Diagnosis Date   A-fib [facility])    Arthritis    Chronic back pain    COPD (chronic obstructive pulmonary disease) (HCC)    Coronary artery disease    COVID-19    08/22/20 likely contracted late 07/2020   Glaucoma    Hearing aid worn    High cholesterol    Hypertension    Lymphadenopathy 01/25/2016   Myocardial infarction ([place])    2000   Non Hodgkin's lymphoma ([place])    Wears dentures    " Top plate"   Wears glasses     Outpatient Medications Prior to Visit  Medication Sig Dispense Refill   albuterol (VENTOLIN HFA) 108 (90 Base) MCG/ACT inhaler Inhale 1-2 puffs into the lungs every 6 (six) hours as needed for wheezing or shortness of breath. 18 each 2   ALPRAZolam (XANAX) 0.25 MG tablet TAKE 1 TABLET BY MOUTH DAILY AS NEEDED FOR ANXIETY 30 tablet 2   amiodarone (PACERONE) 200 MG tablet Take 0.5 tablets (100 mg total) by mouth every morning. 90 tablet 0   amLODipine (NORVASC)  5 MG tablet Take 0.5 tablets (2.5 mg total) by mouth daily. 30 tablet 0   atorvastatin (LIPITOR) 40 MG tablet Take 1 tablet (40 mg total) by mouth every morning. 30 tablet 0   budesonide-formoterol (SYMBICORT) 160-4.5 MCG/ACT inhaler Inhale 2 puffs into the lungs 2 (two) times daily. Rinse mouth stop 80-4.5 1 each 5   cetirizine (ZYRTEC) 10 MG tablet Take 1 tablet (10 mg total) by mouth daily as needed for allergies. 90 tablet 3   Clotrimazole 1 % OINT Apply 1 application topically 2 (two) times daily. 56.7 g 1   DULoxetine (CYMBALTA) 60 MG capsule TAKE 1 CAPSULE BY MOUTH EVERY DAY 90 capsule 1   ELIQUIS 5 MG TABS tablet Take 1 tablet (5 mg total) by mouth 2 (two) times daily. 60  tablet 0   fluticasone (FLONASE) 50 MCG/ACT nasal spray Place 2 sprays into both nostrils daily. 16 g 3   metoprolol succinate (TOPROL-XL) 50 MG 24 hr tablet Take 1 tablet (50 mg total) by mouth daily. 30 tablet 0   nitroGLYCERIN (NITROSTAT) 0.4 MG SL tablet Place 1 tablet (0.4 mg total) under the tongue every 5 (five) minutes x 3 doses as needed for chest pain. 25 tablet 0   timolol (TIMOPTIC) 0.5 % ophthalmic solution Place 1 drop into both eyes 2 (two) times daily. Instill 1 drop in each eye in the morning and one drop in each eye at night     No facility-administered medications prior to visit.     Objective:     BP 136/78 (BP Location: Left Arm, Cuff Size: Normal)   Pulse 64   Temp 97.7 F (36.5 C) (Temporal)   Ht 5\' 9"  (1.753 m)   Wt 208 lb 9.6 oz (94.6 kg)   SpO2 95%   BMI 30.80 kg/m   SpO2: 95 %  Stoic amb wf nad/ walks with cane   HEENT : pt wearing mask not removed for exam due to covid - 19 concerns.   NECK :  without JVD/Nodes/TM/ nl carotid upstrokes bilaterally   LUNGS: no acc muscle use,  Min barrel  contour chest wall with bilateral  slightly decreased bs s audible wheeze and  without cough on insp or exp maneuvers and min  Hyperresonant  to  percussion bilaterally     CV:  RRR  no s3 or murmur or increase in P2, and no edema   ABD:  soft and nontender with pos end  insp Hoover's  in the supine position. No bruits or organomegaly appreciated, bowel sounds nl  MS:   Nl gait/  ext warm without deformities, calf tenderness, cyanosis or clubbing No obvious joint restrictions   SKIN: warm and dry without lesions    NEURO:  alert, approp, nl sensorium with  no motor or cerebellar deficits apparent.        I personally reviewed images and agree with radiology impression as follows:   Chest CTa 08/22/2020  (p covid end of May 2022 )  1. No pulmonary embolus. 2. Moderate bronchial thickening with occasional areas of mucous plugging and bronchial filling, upper  and lower lobe predominant. 3. Occasional peripheral ground-glass opacities in both upper lobes. Favor infection, including COVID 71. 4. Prominent main pulmonary artery, can be seen with pulmonary arterial hypertension. 5. Aortic atherosclerosis.  Coronary artery calcifications.    Assessment   COPD GOLD ? / still smoking  Active smoker -  12/13/2020 rx symbicort 160 up to 2 puffs every 12 hours pending  f/u pfts   - The proper method of use, as well as anticipated side effects, of a metered-dose inhaler were discussed and demonstrated to the patient using teach back method.   Prn symbicort approp for now since really more of a mild AB patter and  Based on two studies from [place]; 20 p 1865 (2018) and 380 : p2020-30 (2019) in pts with mild asthma it is reasonable to use   symbicort   2bid "prn" flare in this setting but I emphasized this was only shown with symbicort and takes advantage of the rapid onset of action but is not the same as "rescue therapy" but can be stopped once the acute symptoms have resolved and the need for rescue has been minimized (< 2 x weekly)    Note using timolol eyedrops and if AB not well controlled on prn symb 160 would first try off the timolol and on bisoptic   >>> f/u with pfts in 3 m    Each maintenance medication was reviewed in detail including emphasizing most importantly the difference between maintenance and prns and under what circumstances the prns are to be triggered using an action plan format where appropriate.  Total time for H and P, chart review, counseling, reviewing hfa device(s) and generating customized AVS unique to this office visit / same day charting = 45 min          Cigarette smoker 4-5 min discussion re active cigarette smoking in addition to office E&M  Ask about tobacco use:  onging  I reviewed the Fletcher curve with the patient that basically indicates  if you quit smoking when your best day FEV1 is still well preserved  (as is clearly  the case here)  it is highly unlikely you will progress to severe disease and informed the patient there was  no medication on the market that has proven to alter the curve/ its downward trajectory  or the likelihood of progression of their disease(unlike other chronic medical conditions such as atheroclerosis where we do think we can change the natural hx with risk reducing meds)    Therefore stopping smoking and maintaining abstinence are  the most important aspects of her care, not choice of inhalers or for that matter, doctors. Treatment other than smoking cessation  is entirely directed by severity of symptoms and focused also on reducing exacerbations, not attempting to change the natural history of the disease.   Assess willingness:  Not committed at this point Assist in quit attempt:  Per PCP when ready Arrange follow up:   Follow up per Primary Care planned            Christinia Gully, MD 12/13/2020

## 2020-12-13 NOTE — Patient Instructions (Signed)
Plan A = Automatic = Always=    Symbicort up to 2 puffs every 12 hours  Work on inhaler technique:  relax and gently blow all the way out then take a nice smooth full deep breath back in, triggering the inhaler at same time you start breathing in.  Hold for up to 5 seconds if you can. Blow symbicort out thru nose. Rinse and gargle with water when done.  If mouth or throat bother you at all,  try brushing teeth/gums/tongue with arm and hammer toothpaste/ make a slurry and gargle and spit out.       Plan B = Backup (to supplement plan A, not to replace it) Only use your albuterol inhaler as a rescue medication to be used if you can't catch your breath by resting or doing a relaxed purse lip breathing pattern.  - The less you use it, the better it will work when you need it. - Ok to use the inhaler up to 2 puffs  every 4 hours if you must but call for appointment if use goes up over your usual need - Don't leave home without it !!  (think of it like the spare tire for your car)    Please schedule a follow up visit in 3 months but call sooner if needed with PFTS

## 2020-12-15 DIAGNOSIS — M5136 Other intervertebral disc degeneration, lumbar region: Secondary | ICD-10-CM | POA: Diagnosis not present

## 2020-12-15 DIAGNOSIS — M5416 Radiculopathy, lumbar region: Secondary | ICD-10-CM | POA: Diagnosis not present

## 2020-12-15 DIAGNOSIS — M9903 Segmental and somatic dysfunction of lumbar region: Secondary | ICD-10-CM | POA: Diagnosis not present

## 2020-12-15 DIAGNOSIS — M9905 Segmental and somatic dysfunction of pelvic region: Secondary | ICD-10-CM | POA: Diagnosis not present

## 2020-12-20 DIAGNOSIS — L821 Other seborrheic keratosis: Secondary | ICD-10-CM | POA: Diagnosis not present

## 2020-12-20 DIAGNOSIS — X32XXXA Exposure to sunlight, initial encounter: Secondary | ICD-10-CM | POA: Diagnosis not present

## 2020-12-20 DIAGNOSIS — D485 Neoplasm of uncertain behavior of skin: Secondary | ICD-10-CM | POA: Diagnosis not present

## 2020-12-20 DIAGNOSIS — M9903 Segmental and somatic dysfunction of lumbar region: Secondary | ICD-10-CM | POA: Diagnosis not present

## 2020-12-20 DIAGNOSIS — L570 Actinic keratosis: Secondary | ICD-10-CM | POA: Diagnosis not present

## 2020-12-20 DIAGNOSIS — M5416 Radiculopathy, lumbar region: Secondary | ICD-10-CM | POA: Diagnosis not present

## 2020-12-20 DIAGNOSIS — M5136 Other intervertebral disc degeneration, lumbar region: Secondary | ICD-10-CM | POA: Diagnosis not present

## 2020-12-20 DIAGNOSIS — D0439 Carcinoma in situ of skin of other parts of face: Secondary | ICD-10-CM | POA: Diagnosis not present

## 2020-12-20 DIAGNOSIS — M9905 Segmental and somatic dysfunction of pelvic region: Secondary | ICD-10-CM | POA: Diagnosis not present

## 2020-12-20 DIAGNOSIS — L720 Epidermal cyst: Secondary | ICD-10-CM | POA: Diagnosis not present

## 2020-12-22 DIAGNOSIS — M5416 Radiculopathy, lumbar region: Secondary | ICD-10-CM | POA: Diagnosis not present

## 2020-12-22 DIAGNOSIS — M9903 Segmental and somatic dysfunction of lumbar region: Secondary | ICD-10-CM | POA: Diagnosis not present

## 2020-12-22 DIAGNOSIS — M9905 Segmental and somatic dysfunction of pelvic region: Secondary | ICD-10-CM | POA: Diagnosis not present

## 2020-12-22 DIAGNOSIS — M5136 Other intervertebral disc degeneration, lumbar region: Secondary | ICD-10-CM | POA: Diagnosis not present

## 2020-12-27 DIAGNOSIS — M9905 Segmental and somatic dysfunction of pelvic region: Secondary | ICD-10-CM | POA: Diagnosis not present

## 2020-12-27 DIAGNOSIS — M5416 Radiculopathy, lumbar region: Secondary | ICD-10-CM | POA: Diagnosis not present

## 2020-12-27 DIAGNOSIS — M9903 Segmental and somatic dysfunction of lumbar region: Secondary | ICD-10-CM | POA: Diagnosis not present

## 2020-12-27 DIAGNOSIS — M5136 Other intervertebral disc degeneration, lumbar region: Secondary | ICD-10-CM | POA: Diagnosis not present

## 2021-01-02 DIAGNOSIS — M5416 Radiculopathy, lumbar region: Secondary | ICD-10-CM | POA: Diagnosis not present

## 2021-01-02 DIAGNOSIS — M9905 Segmental and somatic dysfunction of pelvic region: Secondary | ICD-10-CM | POA: Diagnosis not present

## 2021-01-02 DIAGNOSIS — M9903 Segmental and somatic dysfunction of lumbar region: Secondary | ICD-10-CM | POA: Diagnosis not present

## 2021-01-02 DIAGNOSIS — M5136 Other intervertebral disc degeneration, lumbar region: Secondary | ICD-10-CM | POA: Diagnosis not present

## 2021-01-05 DIAGNOSIS — M9903 Segmental and somatic dysfunction of lumbar region: Secondary | ICD-10-CM | POA: Diagnosis not present

## 2021-01-05 DIAGNOSIS — M9905 Segmental and somatic dysfunction of pelvic region: Secondary | ICD-10-CM | POA: Diagnosis not present

## 2021-01-05 DIAGNOSIS — M5416 Radiculopathy, lumbar region: Secondary | ICD-10-CM | POA: Diagnosis not present

## 2021-01-05 DIAGNOSIS — M5136 Other intervertebral disc degeneration, lumbar region: Secondary | ICD-10-CM | POA: Diagnosis not present

## 2021-01-09 DIAGNOSIS — M5136 Other intervertebral disc degeneration, lumbar region: Secondary | ICD-10-CM | POA: Diagnosis not present

## 2021-01-09 DIAGNOSIS — M9903 Segmental and somatic dysfunction of lumbar region: Secondary | ICD-10-CM | POA: Diagnosis not present

## 2021-01-09 DIAGNOSIS — M5416 Radiculopathy, lumbar region: Secondary | ICD-10-CM | POA: Diagnosis not present

## 2021-01-09 DIAGNOSIS — M9905 Segmental and somatic dysfunction of pelvic region: Secondary | ICD-10-CM | POA: Diagnosis not present

## 2021-01-16 DIAGNOSIS — M5136 Other intervertebral disc degeneration, lumbar region: Secondary | ICD-10-CM | POA: Diagnosis not present

## 2021-01-16 DIAGNOSIS — M9903 Segmental and somatic dysfunction of lumbar region: Secondary | ICD-10-CM | POA: Diagnosis not present

## 2021-01-16 DIAGNOSIS — M9905 Segmental and somatic dysfunction of pelvic region: Secondary | ICD-10-CM | POA: Diagnosis not present

## 2021-01-16 DIAGNOSIS — M5416 Radiculopathy, lumbar region: Secondary | ICD-10-CM | POA: Diagnosis not present

## 2021-01-23 ENCOUNTER — Telehealth: Payer: Self-pay

## 2021-01-23 DIAGNOSIS — M5136 Other intervertebral disc degeneration, lumbar region: Secondary | ICD-10-CM | POA: Diagnosis not present

## 2021-01-23 DIAGNOSIS — M9905 Segmental and somatic dysfunction of pelvic region: Secondary | ICD-10-CM | POA: Diagnosis not present

## 2021-01-23 DIAGNOSIS — M9903 Segmental and somatic dysfunction of lumbar region: Secondary | ICD-10-CM | POA: Diagnosis not present

## 2021-01-23 DIAGNOSIS — M5416 Radiculopathy, lumbar region: Secondary | ICD-10-CM | POA: Diagnosis not present

## 2021-01-23 NOTE — Telephone Encounter (Signed)
Cal;led and spoke to patient about upcoming covid test, pt had a clear understanding. Nothing further needed.

## 2021-01-25 ENCOUNTER — Other Ambulatory Visit
Admission: RE | Admit: 2021-01-25 | Discharge: 2021-01-25 | Disposition: A | Discharge status: Home / Self Care | Payer: PPO | Source: Ambulatory Visit | Attending: Internal Medicine | Admitting: Internal Medicine

## 2021-01-25 ENCOUNTER — Other Ambulatory Visit: Payer: Self-pay

## 2021-01-25 DIAGNOSIS — Z20822 Contact with and (suspected) exposure to covid-19: Secondary | ICD-10-CM | POA: Diagnosis not present

## 2021-01-26 ENCOUNTER — Ambulatory Visit: Payer: PPO | Attending: Internal Medicine

## 2021-01-26 DIAGNOSIS — R0602 Shortness of breath: Secondary | ICD-10-CM | POA: Insufficient documentation

## 2021-01-26 LAB — PULMONARY FUNCTION TEST ARMC ONLY
DL/VA % pred: 74 %
DL/VA: 2.92 ml/min/mmHg/L
DLCO unc % pred: 63 %
DLCO unc: 14.1 ml/min/mmHg
FEF 25-75 Post: 1.42 L/sec
FEF 25-75 Pre: 1.07 L/sec
FEF2575-%Change-Post: 32 %
FEF2575-%Pred-Post: 80 %
FEF2575-%Pred-Pre: 60 %
FEV1-%Change-Post: 10 %
FEV1-%Pred-Post: 72 %
FEV1-%Pred-Pre: 65 %
FEV1-Post: 1.79 L
FEV1-Pre: 1.62 L
FEV1FVC-%Change-Post: 0 %
FEV1FVC-%Pred-Pre: 91 %
FEV6-%Change-Post: 7 %
FEV6-%Pred-Post: 81 %
FEV6-%Pred-Pre: 75 %
FEV6-Post: 2.54 L
FEV6-Pre: 2.37 L
FEV6FVC-%Change-Post: 0 %
FEV6FVC-%Pred-Post: 104 %
FEV6FVC-%Pred-Pre: 104 %
FVC-%Change-Post: 10 %
FVC-%Pred-Post: 79 %
FVC-%Pred-Pre: 72 %
FVC-Post: 2.63 L
FVC-Pre: 2.39 L
Post FEV1/FVC ratio: 68 %
Post FEV6/FVC ratio: 99 %
Pre FEV1/FVC ratio: 68 %
Pre FEV6/FVC Ratio: 99 %
RV % pred: 102 %
RV: 2.69 L
TLC % pred: 91 %
TLC: 5.3 L

## 2021-01-26 LAB — SARS CORONAVIRUS 2 (TAT 6-24 HRS): SARS Coronavirus 2: NEGATIVE

## 2021-01-26 MED ORDER — ALBUTEROL SULFATE (2.5 MG/3ML) 0.083% IN NEBU
2.5000 mg | INHALATION_SOLUTION | Freq: Once | RESPIRATORY_TRACT | Status: AC
  Administered 2021-01-26: 2.5 mg via RESPIRATORY_TRACT
  Filled 2021-01-26: qty 3

## 2021-01-30 DIAGNOSIS — M9903 Segmental and somatic dysfunction of lumbar region: Secondary | ICD-10-CM | POA: Diagnosis not present

## 2021-01-30 DIAGNOSIS — M9905 Segmental and somatic dysfunction of pelvic region: Secondary | ICD-10-CM | POA: Diagnosis not present

## 2021-01-30 DIAGNOSIS — M5136 Other intervertebral disc degeneration, lumbar region: Secondary | ICD-10-CM | POA: Diagnosis not present

## 2021-01-30 DIAGNOSIS — M5416 Radiculopathy, lumbar region: Secondary | ICD-10-CM | POA: Diagnosis not present

## 2021-02-06 DIAGNOSIS — H40153 Residual stage of open-angle glaucoma, bilateral: Secondary | ICD-10-CM | POA: Diagnosis not present

## 2021-02-13 DIAGNOSIS — M9903 Segmental and somatic dysfunction of lumbar region: Secondary | ICD-10-CM | POA: Diagnosis not present

## 2021-02-13 DIAGNOSIS — M9905 Segmental and somatic dysfunction of pelvic region: Secondary | ICD-10-CM | POA: Diagnosis not present

## 2021-02-13 DIAGNOSIS — M5416 Radiculopathy, lumbar region: Secondary | ICD-10-CM | POA: Diagnosis not present

## 2021-02-13 DIAGNOSIS — M5136 Other intervertebral disc degeneration, lumbar region: Secondary | ICD-10-CM | POA: Diagnosis not present

## 2021-02-22 ENCOUNTER — Ambulatory Visit (INDEPENDENT_AMBULATORY_CARE_PROVIDER_SITE_OTHER): Payer: PPO | Admitting: Family

## 2021-02-22 ENCOUNTER — Other Ambulatory Visit: Payer: Self-pay

## 2021-02-22 ENCOUNTER — Encounter: Payer: Self-pay | Admitting: Family

## 2021-02-22 VITALS — BP 124/68 | HR 65 | Temp 95.6°F | Ht 69.0 in | Wt 208.8 lb

## 2021-02-22 DIAGNOSIS — U071 COVID-19: Secondary | ICD-10-CM

## 2021-02-22 DIAGNOSIS — I480 Paroxysmal atrial fibrillation: Secondary | ICD-10-CM

## 2021-02-22 DIAGNOSIS — E782 Mixed hyperlipidemia: Secondary | ICD-10-CM

## 2021-02-22 DIAGNOSIS — I10 Essential (primary) hypertension: Secondary | ICD-10-CM | POA: Diagnosis not present

## 2021-02-22 DIAGNOSIS — J449 Chronic obstructive pulmonary disease, unspecified: Secondary | ICD-10-CM

## 2021-02-22 DIAGNOSIS — F325 Major depressive disorder, single episode, in full remission: Secondary | ICD-10-CM

## 2021-02-22 DIAGNOSIS — J9801 Acute bronchospasm: Secondary | ICD-10-CM

## 2021-02-22 MED ORDER — BUDESONIDE-FORMOTEROL FUMARATE 160-4.5 MCG/ACT IN AERO: 2.0000 | INHALATION_SPRAY | Freq: Two times a day (BID) | RESPIRATORY_TRACT | 5 refills | Status: DC

## 2021-02-22 NOTE — Patient Instructions (Signed)
Please continue to follow with pulmonology , Dr Melvyn Novas, once per year as discussed.   Nice to see you!

## 2021-02-22 NOTE — Assessment & Plan Note (Signed)
Chronic, stable.  will continue Cymbalta 60 mg, uses Xanax 0.25 mg daily qhs

## 2021-02-22 NOTE — Assessment & Plan Note (Signed)
Chronic, symptomatically stable.  Continue regimen as prescribed by Dr. Truitt Merle 100mg , metoprolol succinate 50mg ,  eliquis 5mg  bid.  Will follow

## 2021-02-22 NOTE — Progress Notes (Signed)
Subjective:    Patient ID: DEENA SHAUB, female    DOB: 1940-10-10, 80 y.o.   MRN: [id number]  CC: EMILIJA BOHMAN is a 80 y.o. female who presents today for follow up.   HPI: Feels well today.  No complaints   Depression and anxiety-compliant with Cymbalta 60 mg, uses Xanax 0.25 mg daily qhs which is very helpful for sleep.  She feels that Cymbalta has been helpful for her.  No alcohol use.   Hyperlipidemia-compliant Lipitor 40 mg atrial fibrillation-compliant with amiodarone 100mg , metoprolol succinate 50mg ,  eliquis 5mg  bid. No bleeding, palpitations  No sob and breathing well today. No increase of sputum.    recent follow-up with pulmonology for COPD, 12/13/2020.  Patient using Symbicort prn.  Follow-up cardiology 12/12/2020 atrial fib, CAD, hyperlipidemia, HTN without changes to medication HISTORY:  Past Medical History:  Diagnosis Date   A-fib [facility])    Arthritis    Chronic back pain    COPD (chronic obstructive pulmonary disease) ([place])    Coronary artery disease    COVID-19    08/22/20 likely contracted late 07/2020   Glaucoma    Hearing aid worn    High cholesterol    Hypertension    Lymphadenopathy 01/25/2016   Myocardial infarction ([place])    2000   Non Hodgkin's lymphoma ([place])    Wears dentures    " Top plate"   Wears glasses    Past Surgical History:  Procedure Laterality Date   CARDIAC CATHETERIZATION     CATARACT EXTRACTION W/ INTRAOCULAR LENS IMPLANT     CHOLECYSTECTOMY     COLONOSCOPY W/ BIOPSIES AND POLYPECTOMY     CORONARY STENT PLACEMENT     INGUINAL LYMPH NODE BIOPSY Right 01/25/2016   Procedure: EXCISION DEEP RIGHT INGUINAL LYMPH NODE BIOPSY WITH ULTRA SOUND;  Surgeon: Fanny Skates, MD;  Location: [place];  Service: General;  Laterality: Right;   Family History  Problem Relation Age of Onset   Hypertension Mother    Stroke Mother    Heart disease Father    Heart disease Other     Allergies: Hydrocodone and Oxycodone Current Outpatient  Medications on File Prior to Visit  Medication Sig Dispense Refill   albuterol (VENTOLIN HFA) 108 (90 Base) MCG/ACT inhaler Inhale 1-2 puffs into the lungs every 6 (six) hours as needed for wheezing or shortness of breath. 18 each 2   ALPRAZolam (XANAX) 0.25 MG tablet TAKE 1 TABLET BY MOUTH DAILY AS NEEDED FOR ANXIETY 30 tablet 2   amiodarone (PACERONE) 200 MG tablet Take 0.5 tablets (100 mg total) by mouth every morning. 90 tablet 0   amLODipine (NORVASC) 5 MG tablet Take 0.5 tablets (2.5 mg total) by mouth daily. 30 tablet 0   atorvastatin (LIPITOR) 40 MG tablet Take 1 tablet (40 mg total) by mouth every morning. 30 tablet 0   cetirizine (ZYRTEC) 10 MG tablet Take 1 tablet (10 mg total) by mouth daily as needed for allergies. 90 tablet 3   DULoxetine (CYMBALTA) 60 MG capsule TAKE 1 CAPSULE BY MOUTH EVERY DAY 90 capsule 1   ELIQUIS 5 MG TABS tablet Take 1 tablet (5 mg total) by mouth 2 (two) times daily. 60 tablet 0   fluticasone (FLONASE) 50 MCG/ACT nasal spray Place 2 sprays into both nostrils daily. 16 g 3   metoprolol succinate (TOPROL-XL) 50 MG 24 hr tablet Take 1 tablet (50 mg total) by mouth daily. 30 tablet 0   nitroGLYCERIN (NITROSTAT) 0.4 MG SL tablet  Place 1 tablet (0.4 mg total) under the tongue every 5 (five) minutes x 3 doses as needed for chest pain. 25 tablet 0   timolol (TIMOPTIC) 0.5 % ophthalmic solution Place 1 drop into both eyes 2 (two) times daily. Instill 1 drop in each eye in the morning and one drop in each eye at night     No current facility-administered medications on file prior to visit.    Social History   Tobacco Use   Smoking status: Every Day    Packs/day: 0.50    Years: 60.00    Pack years: 30.00    Types: Cigarettes   Smokeless tobacco: Never  Vaping Use   Vaping Use: Never used  Substance Use Topics   Alcohol use: Yes    Comment: social   Drug use: No    Review of Systems  Constitutional:  Negative for chills and fever.  Respiratory:   Negative for cough and shortness of breath.   Cardiovascular:  Negative for chest pain and palpitations.  Gastrointestinal:  Negative for nausea and vomiting.  Psychiatric/Behavioral:  The patient is not nervous/anxious.      Objective:    BP 124/68 (BP Location: Left Arm, Patient Position: Sitting, Cuff Size: Normal)   Pulse 65   Temp (!) 95.6 F (35.3 C) (Temporal)   Ht 5\' 9"  (1.753 m)   Wt 208 lb 12.8 oz (94.7 kg)   SpO2 96%   BMI 30.83 kg/m  BP Readings from Last 3 Encounters:  02/22/21 124/68  12/13/20 136/78  12/12/20 140/62   Wt Readings from Last 3 Encounters:  02/22/21 208 lb 12.8 oz (94.7 kg)  12/13/20 208 lb 9.6 oz (94.6 kg)  12/12/20 208 lb (94.3 kg)    Physical Exam Vitals reviewed.  Constitutional:      Appearance: She is well-developed.  Eyes:     Conjunctiva/sclera: Conjunctivae normal.  Cardiovascular:     Rate and Rhythm: Normal rate and regular rhythm.     Pulses: Normal pulses.     Heart sounds: Normal heart sounds.  Pulmonary:     Effort: Pulmonary effort is normal.     Breath sounds: Normal breath sounds. No wheezing, rhonchi or rales.  Skin:    General: Skin is warm and dry.  Neurological:     Mental Status: She is alert.  Psychiatric:        Speech: Speech normal.        Behavior: Behavior normal.        Thought Content: Thought content normal.       Assessment & Plan:   Problem List Items Addressed This Visit       Cardiovascular and Mediastinum   HTN (hypertension) - Primary   Relevant Orders   Microalbumin / creatinine urine ratio   Intermittent atrial fibrillation (HCC)    Chronic, symptomatically stable.  Continue regimen as prescribed by Dr. Truitt Merle 100mg , metoprolol succinate 50mg ,  eliquis 5mg  bid.  Will follow        Respiratory   COPD GOLD 2 / still smoking     Chronic, stable at this time.  She is established with pulmonology.  She is currently Symbicort as needed.  Counseled her on the importance of  continued follow-up with pulmonology.  She verbalized understanding      Relevant Medications   budesonide-formoterol (SYMBICORT) 160-4.5 MCG/ACT inhaler     Other   Depression, major, single episode, complete remission (HCC)    Chronic, stable.  will continue Cymbalta  60 mg, uses Xanax 0.25 mg daily qhs       HLD (hyperlipidemia)    Chronic, stable.  Continue Lipitor 40 mg      Other Visit Diagnoses     COVID-19       Relevant Medications   budesonide-formoterol (SYMBICORT) 160-4.5 MCG/ACT inhaler   Bronchospasm       Relevant Medications   budesonide-formoterol (SYMBICORT) 160-4.5 MCG/ACT inhaler        I have discontinued Van Clines. Imel's Clotrimazole and traZODone. I am also having her maintain her timolol, fluticasone, atorvastatin, metoprolol succinate, nitroGLYCERIN, amiodarone, amLODipine, Eliquis, cetirizine, albuterol, DULoxetine, ALPRAZolam, and budesonide-formoterol.   Meds ordered this encounter  Medications   budesonide-formoterol (SYMBICORT) 160-4.5 MCG/ACT inhaler    Sig: Inhale 2 puffs into the lungs 2 (two) times daily. Rinse mouth stop 80-4.5    Dispense:  1 each    Refill:  5    Order Specific Question:   Supervising Provider    Answer:   Crecencio Mc [[id number]]     Return precautions given.   Risks, benefits, and alternatives of the medications and treatment plan prescribed today were discussed, and patient expressed understanding.   Education regarding symptom management and diagnosis given to patient on AVS.  Continue to follow with Burnard Hawthorne, FNP for routine health maintenance.   Velora Mediate and I agreed with plan.   Mable Paris, FNP

## 2021-02-22 NOTE — Assessment & Plan Note (Signed)
Chronic, stable.  Continue Lipitor 40 mg  

## 2021-02-22 NOTE — Assessment & Plan Note (Signed)
Chronic, stable at this time.  She is established with pulmonology.  She is currently Symbicort as needed.  Counseled her on the importance of continued follow-up with pulmonology.  She verbalized understanding

## 2021-02-23 DIAGNOSIS — L570 Actinic keratosis: Secondary | ICD-10-CM | POA: Diagnosis not present

## 2021-02-23 DIAGNOSIS — D0439 Carcinoma in situ of skin of other parts of face: Secondary | ICD-10-CM | POA: Diagnosis not present

## 2021-02-23 DIAGNOSIS — L578 Other skin changes due to chronic exposure to nonionizing radiation: Secondary | ICD-10-CM | POA: Diagnosis not present

## 2021-02-23 LAB — MICROALBUMIN / CREATININE URINE RATIO
Creatinine,U: 134.4 mg/dL
Microalb Creat Ratio: 1.2 mg/g (ref 0.0–30.0)
Microalb, Ur: 1.6 mg/dL (ref 0.0–1.9)

## 2021-03-06 DIAGNOSIS — M9903 Segmental and somatic dysfunction of lumbar region: Secondary | ICD-10-CM | POA: Diagnosis not present

## 2021-03-06 DIAGNOSIS — M5416 Radiculopathy, lumbar region: Secondary | ICD-10-CM | POA: Diagnosis not present

## 2021-03-06 DIAGNOSIS — M9905 Segmental and somatic dysfunction of pelvic region: Secondary | ICD-10-CM | POA: Diagnosis not present

## 2021-03-06 DIAGNOSIS — M5136 Other intervertebral disc degeneration, lumbar region: Secondary | ICD-10-CM | POA: Diagnosis not present

## 2021-03-16 ENCOUNTER — Other Ambulatory Visit: Payer: Self-pay | Admitting: Family

## 2021-03-16 DIAGNOSIS — F419 Anxiety disorder, unspecified: Secondary | ICD-10-CM

## 2021-03-16 NOTE — Telephone Encounter (Signed)
Last OV 02/22/21 okay to fill

## 2021-03-27 DIAGNOSIS — M5136 Other intervertebral disc degeneration, lumbar region: Secondary | ICD-10-CM | POA: Diagnosis not present

## 2021-03-27 DIAGNOSIS — M9903 Segmental and somatic dysfunction of lumbar region: Secondary | ICD-10-CM | POA: Diagnosis not present

## 2021-03-27 DIAGNOSIS — M9905 Segmental and somatic dysfunction of pelvic region: Secondary | ICD-10-CM | POA: Diagnosis not present

## 2021-03-27 DIAGNOSIS — M62830 Muscle spasm of back: Secondary | ICD-10-CM | POA: Diagnosis not present

## 2021-04-11 DIAGNOSIS — J449 Chronic obstructive pulmonary disease, unspecified: Secondary | ICD-10-CM | POA: Diagnosis not present

## 2021-04-11 DIAGNOSIS — D6869 Other thrombophilia: Secondary | ICD-10-CM | POA: Diagnosis not present

## 2021-04-11 DIAGNOSIS — C8590 Non-Hodgkin lymphoma, unspecified, unspecified site: Secondary | ICD-10-CM | POA: Diagnosis not present

## 2021-04-11 DIAGNOSIS — I480 Paroxysmal atrial fibrillation: Secondary | ICD-10-CM | POA: Diagnosis not present

## 2021-04-11 DIAGNOSIS — D692 Other nonthrombocytopenic purpura: Secondary | ICD-10-CM | POA: Diagnosis not present

## 2021-04-11 DIAGNOSIS — I70203 Unspecified atherosclerosis of native arteries of extremities, bilateral legs: Secondary | ICD-10-CM | POA: Diagnosis not present

## 2021-04-11 DIAGNOSIS — M461 Sacroiliitis, not elsewhere classified: Secondary | ICD-10-CM | POA: Diagnosis not present

## 2021-04-11 DIAGNOSIS — F330 Major depressive disorder, recurrent, mild: Secondary | ICD-10-CM | POA: Diagnosis not present

## 2021-04-11 DIAGNOSIS — I8393 Asymptomatic varicose veins of bilateral lower extremities: Secondary | ICD-10-CM | POA: Diagnosis not present

## 2021-04-11 DIAGNOSIS — I25118 Atherosclerotic heart disease of native coronary artery with other forms of angina pectoris: Secondary | ICD-10-CM | POA: Diagnosis not present

## 2021-04-11 DIAGNOSIS — I10 Essential (primary) hypertension: Secondary | ICD-10-CM | POA: Diagnosis not present

## 2021-04-11 DIAGNOSIS — Z720 Tobacco use: Secondary | ICD-10-CM | POA: Diagnosis not present

## 2021-04-17 DIAGNOSIS — M9903 Segmental and somatic dysfunction of lumbar region: Secondary | ICD-10-CM | POA: Diagnosis not present

## 2021-04-17 DIAGNOSIS — M9905 Segmental and somatic dysfunction of pelvic region: Secondary | ICD-10-CM | POA: Diagnosis not present

## 2021-04-17 DIAGNOSIS — M62830 Muscle spasm of back: Secondary | ICD-10-CM | POA: Diagnosis not present

## 2021-04-17 DIAGNOSIS — M5136 Other intervertebral disc degeneration, lumbar region: Secondary | ICD-10-CM | POA: Diagnosis not present

## 2021-05-05 DIAGNOSIS — L578 Other skin changes due to chronic exposure to nonionizing radiation: Secondary | ICD-10-CM | POA: Diagnosis not present

## 2021-05-05 DIAGNOSIS — L244 Irritant contact dermatitis due to drugs in contact with skin: Secondary | ICD-10-CM | POA: Diagnosis not present

## 2021-05-08 DIAGNOSIS — M9903 Segmental and somatic dysfunction of lumbar region: Secondary | ICD-10-CM | POA: Diagnosis not present

## 2021-05-08 DIAGNOSIS — M5136 Other intervertebral disc degeneration, lumbar region: Secondary | ICD-10-CM | POA: Diagnosis not present

## 2021-05-08 DIAGNOSIS — M62830 Muscle spasm of back: Secondary | ICD-10-CM | POA: Diagnosis not present

## 2021-05-08 DIAGNOSIS — M9905 Segmental and somatic dysfunction of pelvic region: Secondary | ICD-10-CM | POA: Diagnosis not present

## 2021-05-12 ENCOUNTER — Other Ambulatory Visit: Payer: Self-pay | Admitting: Family

## 2021-05-12 DIAGNOSIS — F325 Major depressive disorder, single episode, in full remission: Secondary | ICD-10-CM

## 2021-05-19 ENCOUNTER — Other Ambulatory Visit: Payer: Self-pay | Admitting: Internal Medicine

## 2021-05-19 DIAGNOSIS — R0989 Other specified symptoms and signs involving the circulatory and respiratory systems: Secondary | ICD-10-CM

## 2021-05-29 DIAGNOSIS — Z85828 Personal history of other malignant neoplasm of skin: Secondary | ICD-10-CM | POA: Diagnosis not present

## 2021-05-29 DIAGNOSIS — Z08 Encounter for follow-up examination after completed treatment for malignant neoplasm: Secondary | ICD-10-CM | POA: Diagnosis not present

## 2021-06-05 DIAGNOSIS — M9905 Segmental and somatic dysfunction of pelvic region: Secondary | ICD-10-CM | POA: Diagnosis not present

## 2021-06-05 DIAGNOSIS — M62830 Muscle spasm of back: Secondary | ICD-10-CM | POA: Diagnosis not present

## 2021-06-05 DIAGNOSIS — H40153 Residual stage of open-angle glaucoma, bilateral: Secondary | ICD-10-CM | POA: Diagnosis not present

## 2021-06-05 DIAGNOSIS — M9903 Segmental and somatic dysfunction of lumbar region: Secondary | ICD-10-CM | POA: Diagnosis not present

## 2021-06-05 DIAGNOSIS — M5136 Other intervertebral disc degeneration, lumbar region: Secondary | ICD-10-CM | POA: Diagnosis not present

## 2021-06-18 ENCOUNTER — Other Ambulatory Visit: Payer: Self-pay | Admitting: Family

## 2021-06-18 ENCOUNTER — Encounter: Payer: Self-pay | Admitting: Family

## 2021-06-18 DIAGNOSIS — F419 Anxiety disorder, unspecified: Secondary | ICD-10-CM

## 2021-06-19 ENCOUNTER — Other Ambulatory Visit: Payer: Self-pay

## 2021-06-19 ENCOUNTER — Other Ambulatory Visit: Payer: Self-pay | Admitting: Family

## 2021-06-19 DIAGNOSIS — F419 Anxiety disorder, unspecified: Secondary | ICD-10-CM

## 2021-06-19 MED ORDER — ALPRAZOLAM 0.25 MG PO TABS: ORAL_TABLET | ORAL | 2 refills | Status: DC

## 2021-06-19 MED ORDER — ALPRAZOLAM 0.5 MG PO TABS: 0.2500 mg | ORAL_TABLET | Freq: Every day | ORAL | 0 refills | Status: DC | PRN

## 2021-06-19 NOTE — Progress Notes (Signed)
Sent in new rx xanax ?

## 2021-06-27 ENCOUNTER — Other Ambulatory Visit: Payer: Self-pay | Admitting: Family

## 2021-06-27 DIAGNOSIS — F419 Anxiety disorder, unspecified: Secondary | ICD-10-CM

## 2021-06-27 MED ORDER — ALPRAZOLAM 0.5 MG PO TABS: 0.2500 mg | ORAL_TABLET | Freq: Every day | ORAL | 0 refills | Status: DC | PRN

## 2021-07-03 DIAGNOSIS — Z86007 Personal history of in-situ neoplasm of skin: Secondary | ICD-10-CM | POA: Diagnosis not present

## 2021-07-03 DIAGNOSIS — M9905 Segmental and somatic dysfunction of pelvic region: Secondary | ICD-10-CM | POA: Diagnosis not present

## 2021-07-03 DIAGNOSIS — Z08 Encounter for follow-up examination after completed treatment for malignant neoplasm: Secondary | ICD-10-CM | POA: Diagnosis not present

## 2021-07-03 DIAGNOSIS — M5136 Other intervertebral disc degeneration, lumbar region: Secondary | ICD-10-CM | POA: Diagnosis not present

## 2021-07-03 DIAGNOSIS — M9903 Segmental and somatic dysfunction of lumbar region: Secondary | ICD-10-CM | POA: Diagnosis not present

## 2021-07-03 DIAGNOSIS — M62830 Muscle spasm of back: Secondary | ICD-10-CM | POA: Diagnosis not present

## 2021-07-04 DIAGNOSIS — I480 Paroxysmal atrial fibrillation: Secondary | ICD-10-CM | POA: Diagnosis not present

## 2021-07-04 DIAGNOSIS — Z720 Tobacco use: Secondary | ICD-10-CM | POA: Diagnosis not present

## 2021-07-04 DIAGNOSIS — D6869 Other thrombophilia: Secondary | ICD-10-CM | POA: Diagnosis not present

## 2021-07-04 DIAGNOSIS — D692 Other nonthrombocytopenic purpura: Secondary | ICD-10-CM | POA: Diagnosis not present

## 2021-07-04 DIAGNOSIS — M479 Spondylosis, unspecified: Secondary | ICD-10-CM | POA: Diagnosis not present

## 2021-07-04 DIAGNOSIS — Z7901 Long term (current) use of anticoagulants: Secondary | ICD-10-CM | POA: Diagnosis not present

## 2021-07-04 DIAGNOSIS — J449 Chronic obstructive pulmonary disease, unspecified: Secondary | ICD-10-CM | POA: Diagnosis not present

## 2021-07-04 DIAGNOSIS — I70203 Unspecified atherosclerosis of native arteries of extremities, bilateral legs: Secondary | ICD-10-CM | POA: Diagnosis not present

## 2021-07-04 DIAGNOSIS — I8393 Asymptomatic varicose veins of bilateral lower extremities: Secondary | ICD-10-CM | POA: Diagnosis not present

## 2021-07-04 DIAGNOSIS — I10 Essential (primary) hypertension: Secondary | ICD-10-CM | POA: Diagnosis not present

## 2021-07-14 DIAGNOSIS — R490 Dysphonia: Secondary | ICD-10-CM | POA: Diagnosis not present

## 2021-07-14 DIAGNOSIS — J383 Other diseases of vocal cords: Secondary | ICD-10-CM | POA: Diagnosis not present

## 2021-07-31 DIAGNOSIS — M9903 Segmental and somatic dysfunction of lumbar region: Secondary | ICD-10-CM | POA: Diagnosis not present

## 2021-07-31 DIAGNOSIS — M9905 Segmental and somatic dysfunction of pelvic region: Secondary | ICD-10-CM | POA: Diagnosis not present

## 2021-07-31 DIAGNOSIS — M5136 Other intervertebral disc degeneration, lumbar region: Secondary | ICD-10-CM | POA: Diagnosis not present

## 2021-07-31 DIAGNOSIS — M62830 Muscle spasm of back: Secondary | ICD-10-CM | POA: Diagnosis not present

## 2021-08-02 ENCOUNTER — Telehealth: Payer: Self-pay

## 2021-08-02 ENCOUNTER — Ambulatory Visit: Payer: PPO

## 2021-08-02 NOTE — Telephone Encounter (Signed)
Unable to reach patient for scheduled AWV on preferred number. No answer. No voicemail available. Okay to reschedule. ?

## 2021-08-07 ENCOUNTER — Ambulatory Visit (INDEPENDENT_AMBULATORY_CARE_PROVIDER_SITE_OTHER): Payer: PPO

## 2021-08-07 VITALS — Ht 69.0 in | Wt 208.0 lb

## 2021-08-07 DIAGNOSIS — Z0000 Encounter for general adult medical examination without abnormal findings: Secondary | ICD-10-CM | POA: Diagnosis not present

## 2021-08-07 NOTE — Patient Instructions (Addendum)
  Samantha [name] , Thank you for taking time to come for your Medicare Wellness Visit. I appreciate your ongoing commitment to your health goals. Please review the following plan we discussed and let me know if I can assist you in the future.   These are the goals we discussed:  Goals      Healthy Lifestyle     Stay active. Healthy diet.        This is a list of the screening recommended for you and due dates:  Health Maintenance  Topic Date Due   COVID-19 Vaccine (3 - Pfizer risk series) 08/23/2021*   Zoster (Shingles) Vaccine (2 of 2) 11/07/2021*   Tetanus Vaccine  08/08/2022*   Flu Shot  10/17/2021   Urine Protein Check  02/22/2022   Pneumonia Vaccine  Completed   Hepatitis C Screening: USPSTF Recommendation to screen - Ages 18-79 yo.  Completed   HPV Vaccine  Aged Out   DEXA scan (bone density measurement)  Discontinued  *Topic was postponed. The date shown is not the original due date.

## 2021-08-07 NOTE — Progress Notes (Signed)
Subjective:   Samantha [name] is a 81 y.o. female who presents for Medicare Annual (Subsequent) preventive examination.  Review of Systems    No ROS.  Medicare Wellness Virtual Visit.  Visual/audio telehealth visit, UTA vital signs.   See social history for additional risk factors.   Cardiac Risk Factors include: advanced age (>54mn, >>75women)     Objective:    Today's Vitals   08/07/21 1523  Weight: 208 lb (94.3 kg)  Height: '5\' 9"'$  (1.753 m)   Body mass index is 30.72 kg/m.     08/07/2021    3:38 PM 08/01/2020    3:07 PM 07/24/2019   10:46 AM 04/25/2018    8:23 AM 04/18/2018   10:43 AM 05/31/2016    9:09 AM 05/07/2016    9:26 AM  Advanced Directives  Does Patient Have a Medical Advance Directive? Yes Yes Yes Yes Yes Yes Yes  Type of AParamedicof ATaylors IslandLiving will HCantonLiving will HMartinsburgLiving will HCoopers PlainsLiving will HFultonLiving will Healthcare Power of [place]East [place] Does patient want to make changes to medical advance directive? No - Patient declined No - Patient declined No - Patient declined      Copy of HW[place] Chart? No - copy requested No - copy requested  No - copy requested No - copy requested No - copy requested No - copy requested  Would patient like information on creating a medical advance directive?   No - Patient declined        Current Medications (verified) Outpatient Encounter Medications as of 08/07/2021  Medication Sig   albuterol (VENTOLIN HFA) 108 (90 Base) MCG/ACT inhaler Inhale 1-2 puffs into the lungs every 6 (six) hours as needed for wheezing or shortness of breath.   ALPRAZolam (XANAX) 0.5 MG tablet Take 0.5 tablets (0.25 mg total) by mouth daily as needed for anxiety.   amiodarone (PACERONE) 200 MG tablet Take 0.5 tablets (100 mg total) by mouth every morning.   amLODipine (NORVASC) 5  MG tablet Take 0.5 tablets (2.5 mg total) by mouth daily.   atorvastatin (LIPITOR) 40 MG tablet Take 1 tablet (40 mg total) by mouth every morning.   budesonide-formoterol (SYMBICORT) 160-4.5 MCG/ACT inhaler Inhale 2 puffs into the lungs 2 (two) times daily. Rinse mouth stop 80-4.5   cetirizine (ZYRTEC) 10 MG tablet TAKE 1 TABLET BY MOUTH EVERY DAY AS NEEDED FOR ALLERGY   DULoxetine (CYMBALTA) 60 MG capsule TAKE 1 CAPSULE BY MOUTH EVERY DAY   ELIQUIS 5 MG TABS tablet Take 1 tablet (5 mg total) by mouth 2 (two) times daily.   fluticasone (FLONASE) 50 MCG/ACT nasal spray Place 2 sprays into both nostrils daily.   metoprolol succinate (TOPROL-XL) 50 MG 24 hr tablet Take 1 tablet (50 mg total) by mouth daily.   nitroGLYCERIN (NITROSTAT) 0.4 MG SL tablet Place 1 tablet (0.4 mg total) under the tongue every 5 (five) minutes x 3 doses as needed for chest pain.   timolol (TIMOPTIC) 0.5 % ophthalmic solution Place 1 drop into both eyes 2 (two) times daily. Instill 1 drop in each eye in the morning and one drop in each eye at night   No facility-administered encounter medications on file as of 08/07/2021.    Allergies (verified) Hydrocodone and Oxycodone   History: Past Medical History:  Diagnosis Date   A-fib ([place]    Arthritis  Chronic back pain    COPD (chronic obstructive pulmonary disease) (HCC)    Coronary artery disease    COVID-19    08/22/20 likely contracted late 07/2020   Glaucoma    Hearing aid worn    High cholesterol    Hypertension    Lymphadenopathy 01/25/2016   Myocardial infarction ([place])    2000   Non Hodgkin's lymphoma ([place])    Wears dentures    " Top plate"   Wears glasses    Past Surgical History:  Procedure Laterality Date   CARDIAC CATHETERIZATION     CATARACT EXTRACTION W/ INTRAOCULAR LENS IMPLANT     CHOLECYSTECTOMY     COLONOSCOPY W/ BIOPSIES AND POLYPECTOMY     CORONARY STENT PLACEMENT     INGUINAL LYMPH NODE BIOPSY Right 01/25/2016   Procedure: EXCISION  DEEP RIGHT INGUINAL LYMPH NODE BIOPSY WITH ULTRA SOUND;  Surgeon: Fanny Skates, MD;  Location: [place];  Service: General;  Laterality: Right;   Family History  Problem Relation Age of Onset   Hypertension Mother    Stroke Mother    Heart disease Father    Heart disease Other    Social History   Socioeconomic History   Marital status: Single    Spouse name: Not on file   Number of children: Not on file   Years of education: Not on file   Highest education level: Not on file  Occupational History   Not on file  Tobacco Use   Smoking status: Every Day    Packs/day: 0.50    Years: 60.00    Pack years: 30.00    Types: Cigarettes   Smokeless tobacco: Never  Vaping Use   Vaping Use: Never used  Substance and Sexual Activity   Alcohol use: Yes    Comment: social   Drug use: No   Sexual activity: Not Currently  Other Topics Concern   Not on file  Social History Narrative   Not on file   Social Determinants of Health   Financial Resource Strain: Low Risk    Difficulty of Paying Living Expenses: Not hard at all  Food Insecurity: No Food Insecurity   Worried About Charity fundraiser in the Last Year: Never true   Arboriculturist in the Last Year: Never true  Transportation Needs: No Transportation Needs   Lack of Transportation (Medical): No   Lack of Transportation (Non-Medical): No  Physical Activity: Not on file  Stress: No Stress Concern Present   Feeling of Stress : Not at all  Social Connections: Unknown   Frequency of Communication with Friends and Family: More than three times a week   Frequency of Social Gatherings with Friends and Family: More than three times a week   Attends Religious Services: Not on Electrical engineer or Organizations: Not on file   Attends Archivist Meetings: Not on file   Marital Status: Not on file    Tobacco Counseling Ready to quit: Not Answered Counseling given: Not Answered   Clinical  Intake:  Pre-visit preparation completed: Yes        Diabetes: No  How often do you need to have someone help you when you read instructions, pamphlets, or other written materials from your doctor or pharmacy?: 1 - Never   Interpreter Needed?: No      Activities of Daily Living    08/07/2021    3:25 PM  In your present state of health,  do you have any difficulty performing the following activities:  Hearing? 1  Comment Hearing aids  Vision? 0  Difficulty concentrating or making decisions? 0  Walking or climbing stairs? 1  Comment Cane in use  Dressing or bathing? 0  Doing errands, shopping? 0  Preparing Food and eating ? N  Using the Toilet? N  In the past six months, have you accidently leaked urine? N  Do you have problems with loss of bowel control? N  Managing your Medications? N  Managing your Finances? N  Housekeeping or managing your Housekeeping? Y  Comment Maid assist    Patient Care Team: Burnard Hawthorne, FNP as PCP - General (Family Medicine)  Indicate any recent Medical Services you may have received from other than [facility] providers in the past year (date may be approximate).     Assessment:   This is a routine wellness examination for Samantha [name].  Virtual Visit via Telephone Note  I connected with  Samantha [name] on 08/07/21 at  3:15 PM EDT by telephone and verified that I am speaking with the correct person using two identifiers.  Persons participating in the virtual visit: patient/Nurse Health Advisor   I discussed the limitations of performing an evaluation and management service by telehealth. We continued and completed visit with audio only. Some vital signs may be absent or patient reported.   Hearing/Vision screen Hearing Screening - Comments:: Difficulty hearing conversational tones. Has hearing aids but does not wear them often.  Vision Screening - Comments:: Followed by  Wears corrective lenses Cataract extraction, bilateral Visual  acuity not assessed, virtual visit.  They have seen their ophthalmologist in the last 12 months.    Dietary issues and exercise activities discussed: Current Exercise Habits: Home exercise routine, Type of exercise: stretching;walking, Intensity: Mild Regular diet   Goals Addressed             This Visit's Progress    Healthy Lifestyle       Stay active. Healthy diet.       Depression Screen    08/07/2021    3:32 PM 02/22/2021    2:46 PM 10/19/2020    2:59 PM 08/01/2020    3:39 PM 06/22/2020    3:26 PM 03/26/2018   11:04 AM 10/28/2017    4:13 PM  PHQ 2/9 Scores  PHQ - 2 Score 0 2 3 0 '4 1 1  '$ PHQ- 9 Score  8 11 0 9 6     Fall Risk    08/07/2021    3:40 PM 09/01/2020   10:13 AM 08/01/2020    2:56 PM  [place] in the past year? 0 0 0  Number falls in past yr: 0 0 0  Injury with Fall?  0 0  Follow up Falls evaluation completed Falls evaluation completed Falls evaluation completed    [place]: Home free of loose throw rugs in walkways, pet beds, electrical cords, etc? Yes  Adequate lighting in your home to reduce risk of falls? Yes   ASSISTIVE DEVICES UTILIZED TO PREVENT FALLS: Life alert? No  Use of a cane, walker or w/c? Yes   TIMED UP AND GO: Was the test performed? No .   Cognitive Function:  Patient is alert and oriented x3.       08/01/2020    3:39 PM  6CIT Screen  What Year? 0 points  What month? 0 points  What time? 0 points  Count back from 20 0 points  Months in reverse 0 points  Repeat phrase 0 points  Total Score 0 points    Immunizations Immunization History  Administered Date(s) Administered   Fluad Quad(high Dose 65+) 11/23/2020   Influenza, High Dose Seasonal PF 01/04/2017, 12/13/2017, 12/29/2018   Influenza-Unspecified 12/04/2013   Moderna Covid-19 Vaccine Bivalent Booster 98yr & up 02/06/2021   Moderna SARS-COV2 Booster Vaccination 01/10/2020   PFIZER(Purple Top)SARS-COV-2 Vaccination  04/21/2019, 05/13/2019   Pneumococcal Conjugate-13 01/30/2016, 11/01/2018   Pneumococcal Polysaccharide-23 03/04/2017   Zoster Recombinat (Shingrix) 11/01/2018   TDAP status: Due, Education has been provided regarding the importance of this vaccine. Advised may receive this vaccine at local pharmacy or Health Dept. Aware to provide a copy of the vaccination record if obtained from local pharmacy or Health Dept. Verbalized acceptance and understanding.  Shingrix Completed?: No.    Education has been provided regarding the importance of this vaccine. Patient has been advised to call insurance company to determine out of pocket expense if they have not yet received this vaccine. Advised may also receive vaccine at local pharmacy or Health Dept. Verbalized acceptance and understanding.  Screening Tests Health Maintenance  Topic Date Due   COVID-19 Vaccine (3 - Pfizer risk series) 08/23/2021 (Originally 02/06/2021)   Zoster Vaccines- Shingrix (2 of 2) 11/07/2021 (Originally 12/27/2018)   TETANUS/TDAP  08/08/2022 (Originally 12/29/1959)   INFLUENZA VACCINE  10/17/2021   URINE MICROALBUMIN  02/22/2022   Pneumonia Vaccine 80 Years old  Completed   Hepatitis C Screening  Completed   HPV VACCINES  Aged Out   DEXA SCAN  Discontinued   Health Maintenance There are no preventive care reminders to display for this patient.  Hepatitis C Screening: does not qualify.   Vision Screening: Recommended annual ophthalmology exams for early detection of glaucoma and other disorders of the eye.  Dental Screening: Recommended annual dental exams for proper oral hygiene  Community Resource Referral / Chronic Care Management: CRR required this visit?  No   CCM required this visit?  No      Plan:   Keep all routine maintenance appointments.   I have personally reviewed and noted the following in the patient's chart:   Medical and social history Use of alcohol, tobacco or illicit drugs  Current  medications and supplements including opioid prescriptions.  Functional ability and status Nutritional status Physical activity Advanced directives List of other physicians Hospitalizations, surgeries, and ER visits in previous 12 months Vitals Screenings to include cognitive, depression, and falls Referrals and appointments  In addition, I have reviewed and discussed with patient certain preventive protocols, quality metrics, and best practice recommendations. A written personalized care plan for preventive services as well as general preventive health recommendations were provided to patient.     OVarney Biles LPN   [id number]

## 2021-08-21 ENCOUNTER — Other Ambulatory Visit: Payer: Self-pay | Admitting: Family

## 2021-08-21 DIAGNOSIS — M62830 Muscle spasm of back: Secondary | ICD-10-CM | POA: Diagnosis not present

## 2021-08-21 DIAGNOSIS — M5136 Other intervertebral disc degeneration, lumbar region: Secondary | ICD-10-CM | POA: Diagnosis not present

## 2021-08-21 DIAGNOSIS — F419 Anxiety disorder, unspecified: Secondary | ICD-10-CM

## 2021-08-21 DIAGNOSIS — M9905 Segmental and somatic dysfunction of pelvic region: Secondary | ICD-10-CM | POA: Diagnosis not present

## 2021-08-21 DIAGNOSIS — M9903 Segmental and somatic dysfunction of lumbar region: Secondary | ICD-10-CM | POA: Diagnosis not present

## 2021-08-22 MED ORDER — ALPRAZOLAM 0.5 MG PO TABS: 0.2500 mg | ORAL_TABLET | Freq: Every day | ORAL | 2 refills | Status: DC | PRN

## 2021-08-23 ENCOUNTER — Ambulatory Visit (INDEPENDENT_AMBULATORY_CARE_PROVIDER_SITE_OTHER): Payer: PPO | Admitting: Family

## 2021-08-23 ENCOUNTER — Encounter: Payer: Self-pay | Admitting: Family

## 2021-08-23 VITALS — BP 130/82 | HR 62 | Temp 98.1°F | Ht 69.0 in | Wt 201.2 lb

## 2021-08-23 DIAGNOSIS — I10 Essential (primary) hypertension: Secondary | ICD-10-CM

## 2021-08-23 DIAGNOSIS — I2510 Atherosclerotic heart disease of native coronary artery without angina pectoris: Secondary | ICD-10-CM | POA: Diagnosis not present

## 2021-08-23 DIAGNOSIS — F325 Major depressive disorder, single episode, in full remission: Secondary | ICD-10-CM | POA: Diagnosis not present

## 2021-08-23 DIAGNOSIS — E538 Deficiency of other specified B group vitamins: Secondary | ICD-10-CM | POA: Diagnosis not present

## 2021-08-23 DIAGNOSIS — Z780 Asymptomatic menopausal state: Secondary | ICD-10-CM

## 2021-08-23 DIAGNOSIS — I480 Paroxysmal atrial fibrillation: Secondary | ICD-10-CM | POA: Diagnosis not present

## 2021-08-23 NOTE — Progress Notes (Signed)
Subjective:    Patient ID: Samantha [name], female    DOB: 1940-06-18, 81 y.o.   MRN: [id number]  CC: Samantha [name] is a 81 y.o. female who presents today for follow up.   HPI: Feels well today.  She enjoys working in her yard.  She continues to keep up with yard work including mowing her grass.  Intermittent atrial fibrillation-compliant with amiodarone 100 mg, metoprolol succinate 50 mg, Eliquis 5 mg twice daily. No cp, palpitations.   SOB at baseline. Compliant symbicort daily.   Depression-compliant with Cymbalta 60 mg, Xanax 0.25 mg daily nightly.  Sleeping well.  Feels that regimen is working well  HISTORY:  Past Medical History:  Diagnosis Date   A-fib [facility])    Arthritis    Chronic back pain    COPD (chronic obstructive pulmonary disease) ([place])    Coronary artery disease    COVID-19    08/22/20 likely contracted late 07/2020   Glaucoma    Hearing aid worn    High cholesterol    Hypertension    Lymphadenopathy 01/25/2016   Myocardial infarction ([place])    2000   Non Hodgkin's lymphoma ([place])    Wears dentures    " Top plate"   Wears glasses    Past Surgical History:  Procedure Laterality Date   CARDIAC CATHETERIZATION     CATARACT EXTRACTION W/ INTRAOCULAR LENS IMPLANT     CHOLECYSTECTOMY     COLONOSCOPY W/ BIOPSIES AND POLYPECTOMY     CORONARY STENT PLACEMENT     INGUINAL LYMPH NODE BIOPSY Right 01/25/2016   Procedure: EXCISION DEEP RIGHT INGUINAL LYMPH NODE BIOPSY WITH ULTRA SOUND;  Surgeon: Fanny Skates, MD;  Location: [place];  Service: General;  Laterality: Right;   Family History  Problem Relation Age of Onset   Hypertension Mother    Stroke Mother    Heart disease Father    Heart disease Other     Allergies: Hydrocodone and Oxycodone Current Outpatient Medications on File Prior to Visit  Medication Sig Dispense Refill   albuterol (VENTOLIN HFA) 108 (90 Base) MCG/ACT inhaler Inhale 1-2 puffs into the lungs every 6 (six) hours as needed for wheezing or  shortness of breath. 18 each 2   ALPRAZolam (XANAX) 0.5 MG tablet Take 0.5 tablets (0.25 mg total) by mouth daily as needed for anxiety. 15 tablet 2   amiodarone (PACERONE) 200 MG tablet Take 0.5 tablets (100 mg total) by mouth every morning. 90 tablet 0   amLODipine (NORVASC) 5 MG tablet Take 0.5 tablets (2.5 mg total) by mouth daily. 30 tablet 0   atorvastatin (LIPITOR) 40 MG tablet Take 1 tablet (40 mg total) by mouth every morning. 30 tablet 0   budesonide-formoterol (SYMBICORT) 160-4.5 MCG/ACT inhaler Inhale 2 puffs into the lungs 2 (two) times daily. Rinse mouth stop 80-4.5 1 each 5   cetirizine (ZYRTEC) 10 MG tablet TAKE 1 TABLET BY MOUTH EVERY DAY AS NEEDED FOR ALLERGY 90 tablet 3   DULoxetine (CYMBALTA) 60 MG capsule TAKE 1 CAPSULE BY MOUTH EVERY DAY 90 capsule 1   ELIQUIS 5 MG TABS tablet Take 1 tablet (5 mg total) by mouth 2 (two) times daily. 60 tablet 0   fluticasone (FLONASE) 50 MCG/ACT nasal spray Place 2 sprays into both nostrils daily. 16 g 3   metoprolol succinate (TOPROL-XL) 50 MG 24 hr tablet Take 1 tablet (50 mg total) by mouth daily. 30 tablet 0   nitroGLYCERIN (NITROSTAT) 0.4 MG SL tablet Place 1  tablet (0.4 mg total) under the tongue every 5 (five) minutes x 3 doses as needed for chest pain. 25 tablet 0   timolol (TIMOPTIC) 0.5 % ophthalmic solution Place 1 drop into both eyes 2 (two) times daily. Instill 1 drop in each eye in the morning and one drop in each eye at night     No current facility-administered medications on file prior to visit.    Social History   Tobacco Use   Smoking status: Every Day    Packs/day: 0.50    Years: 60.00    Pack years: 30.00    Types: Cigarettes   Smokeless tobacco: Never  Vaping Use   Vaping Use: Never used  Substance Use Topics   Alcohol use: Yes    Comment: social   Drug use: No    Review of Systems  Constitutional:  Negative for chills and fever.  Respiratory:  Negative for cough.   Cardiovascular:  Negative for chest  pain and palpitations.  Gastrointestinal:  Negative for nausea and vomiting.     Objective:    BP 130/82 (BP Location: Left Arm, Patient Position: Sitting, Cuff Size: Normal)   Pulse 62   Temp 98.1 F (36.7 C) (Oral)   Ht '5\' 9"'$  (1.753 m)   Wt 201 lb 3.2 oz (91.3 kg)   SpO2 96%   BMI 29.71 kg/m  BP Readings from Last 3 Encounters:  08/23/21 130/82  02/22/21 124/68  12/13/20 136/78   Wt Readings from Last 3 Encounters:  08/23/21 201 lb 3.2 oz (91.3 kg)  08/07/21 208 lb (94.3 kg)  02/22/21 208 lb 12.8 oz (94.7 kg)    Physical Exam Vitals reviewed.  Constitutional:      Appearance: She is well-developed.  Eyes:     Conjunctiva/sclera: Conjunctivae normal.  Cardiovascular:     Rate and Rhythm: Normal rate and regular rhythm.     Pulses: Normal pulses.     Heart sounds: Normal heart sounds.  Pulmonary:     Effort: Pulmonary effort is normal.     Breath sounds: Normal breath sounds. No wheezing, rhonchi or rales.  Skin:    General: Skin is warm and dry.  Neurological:     Mental Status: She is alert.  Psychiatric:        Speech: Speech normal.        Behavior: Behavior normal.        Thought Content: Thought content normal.       Assessment & Plan:   Problem List Items Addressed This Visit       Cardiovascular and Mediastinum   Coronary artery disease involving native coronary artery of native heart without angina pectoris - Primary   Relevant Orders   CBC with Differential/Platelet   Comprehensive metabolic panel   Lipid panel   HTN (hypertension)   Relevant Orders   TSH   CBC with Differential/Platelet   Hemoglobin A1c   Intermittent atrial fibrillation (HCC)    Chronic, stable.  Continue miodarone 100 mg, metoprolol succinate 50 mg, Eliquis 5 mg twice daily.  She understands to make a follow-up with Dr. Rockey Situ       Relevant Orders   TSH     Other   B12 deficiency   Relevant Orders   B12 and Folate Panel   Depression, major, single episode,  complete remission ([place])    Chronic, well controlled at this time.  Continue Cymbalta 60 mg, Xanax 0.25 mg nightly       Other  Visit Diagnoses     Asymptomatic postmenopausal state       Relevant Orders   VITAMIN D 25 Hydroxy (Vit-D Deficiency, Fractures)        I am having Inez Catalina S. Schueller maintain her timolol, fluticasone, atorvastatin, metoprolol succinate, nitroGLYCERIN, amiodarone, amLODipine, Eliquis, albuterol, budesonide-formoterol, DULoxetine, cetirizine, and ALPRAZolam.   No orders of the defined types were placed in this encounter.   Return precautions given.   Risks, benefits, and alternatives of the medications and treatment plan prescribed today were discussed, and patient expressed understanding.   Education regarding symptom management and diagnosis given to patient on AVS.  Continue to follow with Burnard Hawthorne, FNP for routine health maintenance.   Velora Mediate and I agreed with plan.   Mable Paris, FNP

## 2021-08-23 NOTE — Assessment & Plan Note (Signed)
Chronic, stable.  Continue miodarone 100 mg, metoprolol succinate 50 mg, Eliquis 5 mg twice daily.  She understands to make a follow-up with Dr. Rockey Situ

## 2021-08-23 NOTE — Assessment & Plan Note (Signed)
Chronic, well controlled at this time.  Continue Cymbalta 60 mg, Xanax 0.25 mg nightly

## 2021-08-24 LAB — LIPID PANEL
Cholesterol: 109 mg/dL (ref 0–200)
HDL: 36.4 mg/dL — ABNORMAL LOW (ref >39.00)
LDL Cholesterol: 43 mg/dL (ref 0–99)
NonHDL: 72.58
Total CHOL/HDL Ratio: 3
Triglycerides: 146 mg/dL (ref 0.0–149.0)
VLDL: 29.2 mg/dL (ref 0.0–40.0)

## 2021-08-24 LAB — COMPREHENSIVE METABOLIC PANEL
ALT: 8 U/L (ref 0–35)
AST: 12 U/L (ref 0–37)
Albumin: 3.8 g/dL (ref 3.5–5.2)
Alkaline Phosphatase: 65 U/L (ref 39–117)
BUN: 16 mg/dL (ref 6–23)
CO2: 29 mEq/L (ref 19–32)
Calcium: 9 mg/dL (ref 8.4–10.5)
Chloride: 104 mEq/L (ref 96–112)
Creatinine, Ser: 0.92 mg/dL (ref 0.40–1.20)
GFR: 59.16 mL/min — ABNORMAL LOW (ref >60.00)
Glucose, Bld: 99 mg/dL (ref 70–99)
Potassium: 4.4 mEq/L (ref 3.5–5.1)
Sodium: 141 mEq/L (ref 135–145)
Total Bilirubin: 0.4 mg/dL (ref 0.2–1.2)
Total Protein: 6.3 g/dL (ref 6.0–8.3)

## 2021-08-24 LAB — CBC WITH DIFFERENTIAL/PLATELET
Basophils Absolute: 0.1 10*3/uL (ref 0.0–0.1)
Basophils Relative: 1.3 % (ref 0.0–3.0)
Eosinophils Absolute: 0.1 10*3/uL (ref 0.0–0.7)
Eosinophils Relative: 1.4 % (ref 0.0–5.0)
HCT: 40 % (ref 36.0–46.0)
Hemoglobin: 12.9 g/dL (ref 12.0–15.0)
Lymphocytes Relative: 23.9 % (ref 12.0–46.0)
Lymphs Abs: 2.2 10*3/uL (ref 0.7–4.0)
MCHC: 32.2 g/dL (ref 30.0–36.0)
MCV: 91.8 fl (ref 78.0–100.0)
Monocytes Absolute: 0.7 10*3/uL (ref 0.1–1.0)
Monocytes Relative: 7.8 % (ref 3.0–12.0)
Neutro Abs: 6.2 10*3/uL (ref 1.4–7.7)
Neutrophils Relative %: 65.6 % (ref 43.0–77.0)
Platelets: 269 10*3/uL (ref 150.0–400.0)
RBC: 4.35 Mil/uL (ref 3.87–5.11)
RDW: 13.6 % (ref 11.5–15.5)
WBC: 9.4 10*3/uL (ref 4.0–10.5)

## 2021-08-24 LAB — VITAMIN D 25 HYDROXY (VIT D DEFICIENCY, FRACTURES): VITD: 25.1 ng/mL — ABNORMAL LOW (ref 30.00–100.00)

## 2021-08-24 LAB — HEMOGLOBIN A1C: Hgb A1c MFr Bld: 6.2 % (ref 4.6–6.5)

## 2021-08-24 LAB — TSH: TSH: 3.44 u[IU]/mL (ref 0.35–5.50)

## 2021-08-24 LAB — B12 AND FOLATE PANEL
Folate: 9 ng/mL (ref >5.9)
Vitamin B-12: 220 pg/mL (ref 211–911)

## 2021-09-11 DIAGNOSIS — M62830 Muscle spasm of back: Secondary | ICD-10-CM | POA: Diagnosis not present

## 2021-09-11 DIAGNOSIS — M9905 Segmental and somatic dysfunction of pelvic region: Secondary | ICD-10-CM | POA: Diagnosis not present

## 2021-09-11 DIAGNOSIS — M5136 Other intervertebral disc degeneration, lumbar region: Secondary | ICD-10-CM | POA: Diagnosis not present

## 2021-09-11 DIAGNOSIS — M9903 Segmental and somatic dysfunction of lumbar region: Secondary | ICD-10-CM | POA: Diagnosis not present

## 2021-09-21 DIAGNOSIS — H40153 Residual stage of open-angle glaucoma, bilateral: Secondary | ICD-10-CM | POA: Diagnosis not present

## 2021-10-02 DIAGNOSIS — M5136 Other intervertebral disc degeneration, lumbar region: Secondary | ICD-10-CM | POA: Diagnosis not present

## 2021-10-02 DIAGNOSIS — M62830 Muscle spasm of back: Secondary | ICD-10-CM | POA: Diagnosis not present

## 2021-10-02 DIAGNOSIS — M9905 Segmental and somatic dysfunction of pelvic region: Secondary | ICD-10-CM | POA: Diagnosis not present

## 2021-10-02 DIAGNOSIS — M9903 Segmental and somatic dysfunction of lumbar region: Secondary | ICD-10-CM | POA: Diagnosis not present

## 2021-10-12 ENCOUNTER — Other Ambulatory Visit: Payer: Self-pay | Admitting: Cardiovascular Disease

## 2021-10-12 MED ORDER — ELIQUIS 5 MG PO TABS: 5.0000 mg | ORAL_TABLET | Freq: Two times a day (BID) | ORAL | 2 refills | Status: DC

## 2021-10-12 MED ORDER — NITROGLYCERIN 0.4 MG SL SUBL: 0.4000 mg | SUBLINGUAL_TABLET | SUBLINGUAL | 0 refills | Status: AC | PRN

## 2021-10-12 NOTE — Addendum Note (Signed)
Addended by: Johny Shock B on: 10/12/2021 01:53 PM   Modules accepted: Orders

## 2021-10-12 NOTE — Telephone Encounter (Signed)
Prescription refill request for Eliquis received. Indication: afib  Last office visit: [place], 12/12/2020 Scr: 0.92, 08/23/2021 Age: 81 yo  Weight: 91.3kg

## 2021-10-16 ENCOUNTER — Encounter: Payer: Self-pay | Admitting: Cardiovascular Disease

## 2021-10-16 ENCOUNTER — Ambulatory Visit: Payer: PPO | Admitting: Cardiovascular Disease

## 2021-10-16 VITALS — BP 124/68 | HR 62 | Ht 69.0 in | Wt 201.0 lb

## 2021-10-16 DIAGNOSIS — I25118 Atherosclerotic heart disease of native coronary artery with other forms of angina pectoris: Secondary | ICD-10-CM

## 2021-10-16 DIAGNOSIS — I480 Paroxysmal atrial fibrillation: Secondary | ICD-10-CM

## 2021-10-16 DIAGNOSIS — I10 Essential (primary) hypertension: Secondary | ICD-10-CM

## 2021-10-16 DIAGNOSIS — F17200 Nicotine dependence, unspecified, uncomplicated: Secondary | ICD-10-CM

## 2021-10-16 DIAGNOSIS — E782 Mixed hyperlipidemia: Secondary | ICD-10-CM | POA: Diagnosis not present

## 2021-10-16 MED ORDER — AMLODIPINE BESYLATE 5 MG PO TABS: 2.5000 mg | ORAL_TABLET | Freq: Every day | ORAL | 3 refills | Status: DC

## 2021-10-16 MED ORDER — METOPROLOL SUCCINATE ER 50 MG PO TB24: 50.0000 mg | ORAL_TABLET | Freq: Every day | ORAL | 3 refills | Status: DC

## 2021-10-16 MED ORDER — ELIQUIS 5 MG PO TABS: 5.0000 mg | ORAL_TABLET | Freq: Two times a day (BID) | ORAL | 3 refills | Status: DC

## 2021-10-16 MED ORDER — AMIODARONE HCL 200 MG PO TABS: 100.0000 mg | ORAL_TABLET | Freq: Every morning | ORAL | 3 refills | Status: DC

## 2021-10-16 MED ORDER — ATORVASTATIN CALCIUM 40 MG PO TABS: 40.0000 mg | ORAL_TABLET | ORAL | 3 refills | Status: DC

## 2021-10-16 NOTE — Patient Instructions (Signed)
Medication Instructions:  No changes  If you need a refill on your cardiac medications before your next appointment, please call your pharmacy.   Lab work: No new labs needed  Testing/Procedures: No new testing needed  Follow-Up: At [facility], you and your health needs are our priority.  As part of our continuing mission to provide you with exceptional heart care, we have created designated Provider Care Teams.  These Care Teams include your primary Cardiologist (physician) and Advanced Practice Providers (APPs -  Physician Assistants and Nurse Practitioners) who all work together to provide you with the care you need, when you need it.  You will need a follow up appointment in 12 months  Providers on your designated Care Team:   Christopher Berge, NP Ryan Dunn, PA-C Cadence Furth, PA-C  COVID-19 Vaccine Information can be found at: [email or web address] For questions related to vaccine distribution or appointments, please email [email or web address] or call [phone].   

## 2021-10-16 NOTE — Progress Notes (Signed)
Cardiology Office Note  Date:  10/16/2021   ID:  Samantha [name], DOB 1941-01-30, MRN [id number]  PCP:  Burnard Hawthorne, FNP   Chief Complaint  Patient presents with   PHQ-9 12 Week Follow-up    Patient c/o shortness of breath with walking a long distance. Medications reviewed by the patient verbally.     HPI:  [name] is 81 yo woman with PMH of  Follicular lymphoma, chemo Atrial fibrillation Smokes 1/2 ppd CAD prior stent, 2001, mid RCA Back surgery, lower Previously seen in [place] Presents for f/u of her coronary disease, stable angina, paroxysmal atrial fibrillation  LOV 11/2020 In follow-up today she reports doing well overall Chronic SOB, continues to smoke 1/2 pack/day Prior anginal symptoms include chest pain and arm pain 20 years ago She denies having similar anginal symptoms currently No regular exercise Bothered by Chronic leg pain, scitica, followed by Dr. Sharlet Salina, hoping she can get a cortisone shot Walks with a cane Lives alone, does her ADLs  Covid 07/2020  EKG personally reviewed by myself on todays visit Normal sinus rhythm rate 62 bpm nonspecific ST abnormality, old inferior MI  Other past medical history reviewed Cardiac cath: 2001,  Had left arm pain, chest pain Stent to RCA Has had cath since then, details unclear ("couple times")  Echo 2017:normal EF  CT chest 2019 for lymphoma 1. Stable mild retroperitoneal adenopathy, compatible with treated lymphoma. No new or progressive adenopathy. No new sites of disease. Normal size spleen. 2. Three-vessel coronary atherosclerosis. 3.  Aortic Atherosclerosis   Stress test 2015 Small relatively matched area of attenuation involving the  inferior wall of the left ventricle without associated regional wall  motion abnormality. No definite scintigraphic evidence of prior  infarction or pharmacologically induced ischemia.  2. Normal wall motion. Ejection fraction - 54%. (Previous ejection  fraction -  68%).  In 2020 Total chol 118, LDL 58 A1C 6.0   PMH:   has a past medical history of A-fib ([place]), Arthritis, Chronic back pain, COPD (chronic obstructive pulmonary disease) ([place]), Coronary artery disease, COVID-19, Glaucoma, Hearing aid worn, High cholesterol, Hypertension, Lymphadenopathy (01/25/2016), Myocardial infarction ([place]), Non Hodgkin's lymphoma ([place]), Wears dentures, and Wears glasses.  PSH:    Past Surgical History:  Procedure Laterality Date   CARDIAC CATHETERIZATION     CATARACT EXTRACTION W/ INTRAOCULAR LENS IMPLANT     CHOLECYSTECTOMY     COLONOSCOPY W/ BIOPSIES AND POLYPECTOMY     CORONARY STENT PLACEMENT     INGUINAL LYMPH NODE BIOPSY Right 01/25/2016   Procedure: EXCISION DEEP RIGHT INGUINAL LYMPH NODE BIOPSY WITH ULTRA SOUND;  Surgeon: Fanny Skates, MD;  Location: [facility];  Service: General;  Laterality: Right;    Current Outpatient Medications  Medication Sig Dispense Refill   albuterol (VENTOLIN HFA) 108 (90 Base) MCG/ACT inhaler Inhale 1-2 puffs into the lungs every 6 (six) hours as needed for wheezing or shortness of breath. 18 each 2   ALPRAZolam (XANAX) 0.5 MG tablet Take 0.5 tablets (0.25 mg total) by mouth daily as needed for anxiety. 15 tablet 2   budesonide-formoterol (SYMBICORT) 160-4.5 MCG/ACT inhaler Inhale 2 puffs into the lungs 2 (two) times daily. Rinse mouth stop 80-4.5 1 each 5   DULoxetine (CYMBALTA) 60 MG capsule TAKE 1 CAPSULE BY MOUTH EVERY DAY 90 capsule 1   fluticasone (FLONASE) 50 MCG/ACT nasal spray Place 2 sprays into both nostrils daily. 16 g 3   nitroGLYCERIN (NITROSTAT) 0.4 MG SL tablet Place 1 tablet (0.4  mg total) under the tongue every 5 (five) minutes x 3 doses as needed for chest pain. 25 tablet 0   timolol (TIMOPTIC) 0.5 % ophthalmic solution Place 1 drop into both eyes 2 (two) times daily. Instill 1 drop in each eye in the morning and one drop in each eye at night     amiodarone (PACERONE) 200 MG tablet Take 0.5 tablets (100 mg  total) by mouth every morning. 90 tablet 3   amLODipine (NORVASC) 5 MG tablet Take 0.5 tablets (2.5 mg total) by mouth daily. 45 tablet 3   atorvastatin (LIPITOR) 40 MG tablet Take 1 tablet (40 mg total) by mouth every morning. 90 tablet 3   cetirizine (ZYRTEC) 10 MG tablet TAKE 1 TABLET BY MOUTH EVERY DAY AS NEEDED FOR ALLERGY (Patient not taking: Reported on 10/16/2021) 90 tablet 3   ELIQUIS 5 MG TABS tablet Take 1 tablet (5 mg total) by mouth 2 (two) times daily. 180 tablet 3   metoprolol succinate (TOPROL-XL) 50 MG 24 hr tablet Take 1 tablet (50 mg total) by mouth daily. 90 tablet 3   No current facility-administered medications for this visit.     Allergies:   Hydrocodone and Oxycodone   Social History:  The patient  reports that she has been smoking cigarettes. She has a 30.00 pack-year smoking history. She has never used smokeless tobacco. She reports current alcohol use. She reports that she does not use drugs.   Family History:   family history includes Heart disease in her father and another family member; Hypertension in her mother; Stroke in her mother.    Review of Systems: Review of Systems  Constitutional: Negative.   HENT: Negative.    Respiratory: Negative.    Cardiovascular: Negative.   Gastrointestinal: Negative.   Musculoskeletal:  Positive for back pain and joint pain.  Neurological: Negative.   Psychiatric/Behavioral: Negative.    All other systems reviewed and are negative.   PHYSICAL EXAM: VS:  BP 124/68 (BP Location: Left Arm, Patient Position: Sitting, Cuff Size: Normal)   Pulse 62   Ht '5\' 9"'$  (1.753 m)   Wt 201 lb (91.2 kg)   SpO2 98%   BMI 29.68 kg/m  , BMI Body mass index is 29.68 kg/m. Constitutional:  oriented to person, place, and time. No distress.  HENT:  Head: Grossly normal Eyes:  no discharge. No scleral icterus.  Neck: No JVD, no carotid bruits  Cardiovascular: Regular rate and rhythm, no murmurs appreciated Pulmonary/Chest: Clear to  auscultation bilaterally, no wheezes or rails Abdominal: Soft.  no distension.  no tenderness.  Musculoskeletal: Normal range of motion Neurological:  normal muscle tone. Coordination normal. No atrophy Skin: Skin warm and dry Psychiatric: normal affect, pleasant   Recent Labs: 08/23/2021: ALT 8; BUN 16; Creatinine, Ser 0.92; Hemoglobin 12.9; Platelets 269.0; Potassium 4.4; Sodium 141; TSH 3.44    Lipid Panel Lab Results  Component Value Date   CHOL 109 08/23/2021   HDL 36.40 (L) 08/23/2021   LDLCALC 43 08/23/2021   TRIG 146.0 08/23/2021      Wt Readings from Last 3 Encounters:  10/16/21 201 lb (91.2 kg)  08/23/21 201 lb 3.2 oz (91.3 kg)  08/07/21 208 lb (94.3 kg)     ASSESSMENT AND PLAN:  Problem List Items Addressed This Visit       Cardiology Problems   Intermittent atrial fibrillation (HCC) - Primary   Relevant Medications   amiodarone (PACERONE) 200 MG tablet   amLODipine (NORVASC) 5 MG tablet  atorvastatin (LIPITOR) 40 MG tablet   ELIQUIS 5 MG TABS tablet   metoprolol succinate (TOPROL-XL) 50 MG 24 hr tablet   HLD (hyperlipidemia)   Relevant Medications   amiodarone (PACERONE) 200 MG tablet   amLODipine (NORVASC) 5 MG tablet   atorvastatin (LIPITOR) 40 MG tablet   ELIQUIS 5 MG TABS tablet   metoprolol succinate (TOPROL-XL) 50 MG 24 hr tablet   Coronary artery disease involving native coronary artery of native heart without angina pectoris   Relevant Medications   amiodarone (PACERONE) 200 MG tablet   amLODipine (NORVASC) 5 MG tablet   atorvastatin (LIPITOR) 40 MG tablet   ELIQUIS 5 MG TABS tablet   metoprolol succinate (TOPROL-XL) 50 MG 24 hr tablet   Other Visit Diagnoses     Smoker       Essential hypertension       Relevant Medications   amiodarone (PACERONE) 200 MG tablet   amLODipine (NORVASC) 5 MG tablet   atorvastatin (LIPITOR) 40 MG tablet   ELIQUIS 5 MG TABS tablet   metoprolol succinate (TOPROL-XL) 50 MG 24 hr tablet     CAD with  stable angina Denies chest pain and arm pain concerning for angina Shortness of breath likely from deconditioning, long smoking history walking in the heat If symptoms get worse recommend she call our office for further evaluation No medication changes made  Hyperlipidemia Cholesterol is at goal on the current lipid regimen. No changes to the medications were made.  HTN Blood pressure is well controlled on today's visit. No changes made to the medications.  Atrial fib maintaining NSR, Recommend she continue low-dose amiodarone, metoprolol Denies tachypalpitations concerning for arrhythmia    Total encounter time more than 25 minutes  Greater than 50% was spent in counseling and coordination of care with the patient    Signed, Esmond Plants, M.D., Ph.D. [place], [place]

## 2021-10-17 DIAGNOSIS — F17200 Nicotine dependence, unspecified, uncomplicated: Secondary | ICD-10-CM | POA: Diagnosis not present

## 2021-10-17 DIAGNOSIS — D692 Other nonthrombocytopenic purpura: Secondary | ICD-10-CM | POA: Diagnosis not present

## 2021-10-17 DIAGNOSIS — I129 Hypertensive chronic kidney disease with stage 1 through stage 4 chronic kidney disease, or unspecified chronic kidney disease: Secondary | ICD-10-CM | POA: Diagnosis not present

## 2021-10-17 DIAGNOSIS — J449 Chronic obstructive pulmonary disease, unspecified: Secondary | ICD-10-CM | POA: Diagnosis not present

## 2021-10-17 DIAGNOSIS — I480 Paroxysmal atrial fibrillation: Secondary | ICD-10-CM | POA: Diagnosis not present

## 2021-10-17 DIAGNOSIS — D6869 Other thrombophilia: Secondary | ICD-10-CM | POA: Diagnosis not present

## 2021-10-23 DIAGNOSIS — M5416 Radiculopathy, lumbar region: Secondary | ICD-10-CM | POA: Diagnosis not present

## 2021-10-23 DIAGNOSIS — M48062 Spinal stenosis, lumbar region with neurogenic claudication: Secondary | ICD-10-CM | POA: Diagnosis not present

## 2021-10-25 DIAGNOSIS — M5416 Radiculopathy, lumbar region: Secondary | ICD-10-CM | POA: Diagnosis not present

## 2021-10-25 DIAGNOSIS — M48062 Spinal stenosis, lumbar region with neurogenic claudication: Secondary | ICD-10-CM | POA: Diagnosis not present

## 2021-10-30 DIAGNOSIS — M5136 Other intervertebral disc degeneration, lumbar region: Secondary | ICD-10-CM | POA: Diagnosis not present

## 2021-10-30 DIAGNOSIS — M62830 Muscle spasm of back: Secondary | ICD-10-CM | POA: Diagnosis not present

## 2021-10-30 DIAGNOSIS — M9905 Segmental and somatic dysfunction of pelvic region: Secondary | ICD-10-CM | POA: Diagnosis not present

## 2021-10-30 DIAGNOSIS — M9903 Segmental and somatic dysfunction of lumbar region: Secondary | ICD-10-CM | POA: Diagnosis not present

## 2021-11-03 ENCOUNTER — Ambulatory Visit (INDEPENDENT_AMBULATORY_CARE_PROVIDER_SITE_OTHER): Payer: PPO | Admitting: Family

## 2021-11-03 ENCOUNTER — Encounter: Payer: Self-pay | Admitting: Family

## 2021-11-03 DIAGNOSIS — E782 Mixed hyperlipidemia: Secondary | ICD-10-CM | POA: Diagnosis not present

## 2021-11-03 DIAGNOSIS — F325 Major depressive disorder, single episode, in full remission: Secondary | ICD-10-CM

## 2021-11-03 NOTE — Assessment & Plan Note (Signed)
Chronic, stable.  Continue Xanax 0.25 mg nightly, Cymbalta '60mg'$ 

## 2021-11-03 NOTE — Assessment & Plan Note (Signed)
Excellent control, continue Lipitor 40 mg

## 2021-11-03 NOTE — Progress Notes (Signed)
Subjective:    Patient ID: Samantha [name], female    DOB: [birth year], 81 y.o.   MRN: [id number]  CC: Samantha [name] is a 81 y.o. female who presents today for an acute visit.    HPI: She is here for follow up .  She feels well today.  No new complaints      Lumbar generative disc disease - improved after epidural injection.  She is not particular bothered by back pain at this time . she continues to following with Dr. Sharlet Salina  She is compliant with Lipitor 40 mg She is compliant with Xanax 0.25 mg nightly, Cymbalta '60mg'$  which she thinks is very helpful for anxiety     HISTORY:  Past Medical History:  Diagnosis Date   A-fib [facility])    Arthritis    Chronic back pain    COPD (chronic obstructive pulmonary disease) ([place])    Coronary artery disease    COVID-19    08/22/20 likely contracted late 07/2020   Glaucoma    Hearing aid worn    High cholesterol    Hypertension    Lymphadenopathy 01/25/2016   Myocardial infarction ([place])    2000   Non Hodgkin's lymphoma ([place])    Wears dentures    " Top plate"   Wears glasses    Past Surgical History:  Procedure Laterality Date   CARDIAC CATHETERIZATION     CATARACT EXTRACTION W/ INTRAOCULAR LENS IMPLANT     CHOLECYSTECTOMY     COLONOSCOPY W/ BIOPSIES AND POLYPECTOMY     CORONARY STENT PLACEMENT     INGUINAL LYMPH NODE BIOPSY Right 01/25/2016   Procedure: EXCISION DEEP RIGHT INGUINAL LYMPH NODE BIOPSY WITH ULTRA SOUND;  Surgeon: Fanny Skates, MD;  Location: [facility];  Service: General;  Laterality: Right;   Family History  Problem Relation Age of Onset   Hypertension Mother    Stroke Mother    Heart disease Father    Heart disease Other     Allergies: Hydrocodone and Oxycodone Current Outpatient Medications on File Prior to Visit  Medication Sig Dispense Refill   albuterol (VENTOLIN HFA) 108 (90 Base) MCG/ACT inhaler Inhale 1-2 puffs into the lungs every 6 (six) hours as needed for wheezing or shortness of breath. 18 each 2    ALPRAZolam (XANAX) 0.5 MG tablet Take 0.5 tablets (0.25 mg total) by mouth daily as needed for anxiety. 15 tablet 2   amiodarone (PACERONE) 200 MG tablet Take 0.5 tablets (100 mg total) by mouth every morning. 90 tablet 3   amLODipine (NORVASC) 5 MG tablet Take 0.5 tablets (2.5 mg total) by mouth daily. 45 tablet 3   atorvastatin (LIPITOR) 40 MG tablet Take 1 tablet (40 mg total) by mouth every morning. 90 tablet 3   budesonide-formoterol (SYMBICORT) 160-4.5 MCG/ACT inhaler Inhale 2 puffs into the lungs 2 (two) times daily. Rinse mouth stop 80-4.5 1 each 5   DULoxetine (CYMBALTA) 60 MG capsule TAKE 1 CAPSULE BY MOUTH EVERY DAY 90 capsule 1   ELIQUIS 5 MG TABS tablet Take 1 tablet (5 mg total) by mouth 2 (two) times daily. 180 tablet 3   fluticasone (FLONASE) 50 MCG/ACT nasal spray Place 2 sprays into both nostrils daily. 16 g 3   metoprolol succinate (TOPROL-XL) 50 MG 24 hr tablet Take 1 tablet (50 mg total) by mouth daily. 90 tablet 3   nitroGLYCERIN (NITROSTAT) 0.4 MG SL tablet Place 1 tablet (0.4 mg total) under the tongue every 5 (five) minutes x 3  doses as needed for chest pain. 25 tablet 0   timolol (TIMOPTIC) 0.5 % ophthalmic solution Place 1 drop into both eyes 2 (two) times daily. Instill 1 drop in each eye in the morning and one drop in each eye at night     cetirizine (ZYRTEC) 10 MG tablet TAKE 1 TABLET BY MOUTH EVERY DAY AS NEEDED FOR ALLERGY (Patient not taking: Reported on 10/16/2021) 90 tablet 3   No current facility-administered medications on file prior to visit.    Social History   Tobacco Use   Smoking status: Every Day    Packs/day: 0.50    Years: 60.00    Total pack years: 30.00    Types: Cigarettes   Smokeless tobacco: Never  Vaping Use   Vaping Use: Never used  Substance Use Topics   Alcohol use: Yes    Comment: social   Drug use: No    Review of Systems  Constitutional:  Negative for chills and fever.  Respiratory:  Negative for cough.   Cardiovascular:   Negative for chest pain and palpitations.  Gastrointestinal:  Negative for nausea and vomiting.  Musculoskeletal:  Positive for back pain (chronic).  Psychiatric/Behavioral:  The patient is not nervous/anxious.       Objective:    BP 126/70 (BP Location: Left Arm, Patient Position: Sitting, Cuff Size: Normal)   Pulse 63   Temp (!) 97.5 F (36.4 C) (Oral)   Ht '5\' 9"'$  (1.753 m)   Wt 202 lb 12.8 oz (92 kg)   SpO2 96%   BMI 29.95 kg/m    Physical Exam Vitals reviewed.  Constitutional:      Appearance: She is well-developed.  Eyes:     Conjunctiva/sclera: Conjunctivae normal.  Cardiovascular:     Rate and Rhythm: Normal rate and regular rhythm.     Pulses: Normal pulses.     Heart sounds: Normal heart sounds.  Pulmonary:     Effort: Pulmonary effort is normal.     Breath sounds: Normal breath sounds. No wheezing, rhonchi or rales.  Skin:    General: Skin is warm and dry.  Neurological:     Mental Status: She is alert.  Psychiatric:        Speech: Speech normal.        Behavior: Behavior normal.        Thought Content: Thought content normal.        Assessment & Plan:      I am having Van Clines. Rigor maintain her timolol, fluticasone, albuterol, budesonide-formoterol, DULoxetine, cetirizine, ALPRAZolam, nitroGLYCERIN, amiodarone, amLODipine, atorvastatin, Eliquis, and metoprolol succinate.   No orders of the defined types were placed in this encounter.   Return precautions given.   Risks, benefits, and alternatives of the medications and treatment plan prescribed today were discussed, and patient expressed understanding.   Education regarding symptom management and diagnosis given to patient on AVS.  Continue to follow with Burnard Hawthorne, FNP for routine health maintenance.   Velora Mediate and I agreed with plan.   Mable Paris, FNP

## 2021-11-06 DIAGNOSIS — L814 Other melanin hyperpigmentation: Secondary | ICD-10-CM | POA: Diagnosis not present

## 2021-11-06 DIAGNOSIS — L821 Other seborrheic keratosis: Secondary | ICD-10-CM | POA: Diagnosis not present

## 2021-11-06 DIAGNOSIS — D485 Neoplasm of uncertain behavior of skin: Secondary | ICD-10-CM | POA: Diagnosis not present

## 2021-11-06 DIAGNOSIS — Z85828 Personal history of other malignant neoplasm of skin: Secondary | ICD-10-CM | POA: Diagnosis not present

## 2021-11-06 DIAGNOSIS — Z08 Encounter for follow-up examination after completed treatment for malignant neoplasm: Secondary | ICD-10-CM | POA: Diagnosis not present

## 2021-11-06 DIAGNOSIS — C44519 Basal cell carcinoma of skin of other part of trunk: Secondary | ICD-10-CM | POA: Diagnosis not present

## 2021-11-09 ENCOUNTER — Other Ambulatory Visit: Payer: Self-pay | Admitting: Family

## 2021-11-09 DIAGNOSIS — F325 Major depressive disorder, single episode, in full remission: Secondary | ICD-10-CM

## 2021-11-23 DIAGNOSIS — M5416 Radiculopathy, lumbar region: Secondary | ICD-10-CM | POA: Diagnosis not present

## 2021-11-23 DIAGNOSIS — M48062 Spinal stenosis, lumbar region with neurogenic claudication: Secondary | ICD-10-CM | POA: Diagnosis not present

## 2021-11-24 ENCOUNTER — Other Ambulatory Visit: Payer: Self-pay | Admitting: Physical Medicine and Rehabilitation

## 2021-11-24 DIAGNOSIS — M5416 Radiculopathy, lumbar region: Secondary | ICD-10-CM

## 2021-11-27 ENCOUNTER — Ambulatory Visit: Payer: PPO | Admitting: Family

## 2021-12-04 DIAGNOSIS — C44519 Basal cell carcinoma of skin of other part of trunk: Secondary | ICD-10-CM | POA: Diagnosis not present

## 2021-12-08 ENCOUNTER — Ambulatory Visit
Admission: RE | Admit: 2021-12-08 | Discharge: 2021-12-08 | Disposition: A | Discharge status: Home / Self Care | Payer: PPO | Source: Ambulatory Visit | Attending: Physical Medicine and Rehabilitation | Admitting: Physical Medicine and Rehabilitation

## 2021-12-08 DIAGNOSIS — M545 Low back pain, unspecified: Secondary | ICD-10-CM | POA: Diagnosis not present

## 2021-12-08 DIAGNOSIS — M5416 Radiculopathy, lumbar region: Secondary | ICD-10-CM

## 2021-12-08 DIAGNOSIS — M79605 Pain in left leg: Secondary | ICD-10-CM | POA: Diagnosis not present

## 2021-12-08 DIAGNOSIS — M48061 Spinal stenosis, lumbar region without neurogenic claudication: Secondary | ICD-10-CM | POA: Diagnosis not present

## 2021-12-08 DIAGNOSIS — M47816 Spondylosis without myelopathy or radiculopathy, lumbar region: Secondary | ICD-10-CM | POA: Diagnosis not present

## 2021-12-13 DIAGNOSIS — M5416 Radiculopathy, lumbar region: Secondary | ICD-10-CM | POA: Diagnosis not present

## 2021-12-13 DIAGNOSIS — Z6828 Body mass index (BMI) 28.0-28.9, adult: Secondary | ICD-10-CM | POA: Diagnosis not present

## 2021-12-14 DIAGNOSIS — F419 Anxiety disorder, unspecified: Secondary | ICD-10-CM

## 2021-12-22 MED ORDER — ALPRAZOLAM 0.25 MG PO TABS: 0.2500 mg | ORAL_TABLET | Freq: Every day | ORAL | 1 refills | Status: DC | PRN

## 2021-12-22 NOTE — Telephone Encounter (Signed)
I looked up patient on [place] Controlled Substances Reporting System PMP AWARE and saw no activity that raised concern of inappropriate use.   

## 2021-12-25 ENCOUNTER — Encounter: Payer: Self-pay | Admitting: Family

## 2021-12-25 ENCOUNTER — Ambulatory Visit (INDEPENDENT_AMBULATORY_CARE_PROVIDER_SITE_OTHER): Payer: PPO | Admitting: Family

## 2021-12-25 VITALS — BP 128/70 | HR 67 | Temp 97.7°F | Ht 69.0 in | Wt 205.8 lb

## 2021-12-25 DIAGNOSIS — R7303 Prediabetes: Secondary | ICD-10-CM | POA: Diagnosis not present

## 2021-12-25 DIAGNOSIS — M9903 Segmental and somatic dysfunction of lumbar region: Secondary | ICD-10-CM | POA: Diagnosis not present

## 2021-12-25 DIAGNOSIS — F325 Major depressive disorder, single episode, in full remission: Secondary | ICD-10-CM

## 2021-12-25 DIAGNOSIS — E559 Vitamin D deficiency, unspecified: Secondary | ICD-10-CM

## 2021-12-25 DIAGNOSIS — M5136 Other intervertebral disc degeneration, lumbar region: Secondary | ICD-10-CM | POA: Diagnosis not present

## 2021-12-25 DIAGNOSIS — M9905 Segmental and somatic dysfunction of pelvic region: Secondary | ICD-10-CM | POA: Diagnosis not present

## 2021-12-25 DIAGNOSIS — Z23 Encounter for immunization: Secondary | ICD-10-CM | POA: Diagnosis not present

## 2021-12-25 DIAGNOSIS — M62830 Muscle spasm of back: Secondary | ICD-10-CM | POA: Diagnosis not present

## 2021-12-25 LAB — VITAMIN D 25 HYDROXY (VIT D DEFICIENCY, FRACTURES): VITD: 30.08 ng/mL (ref 30.00–100.00)

## 2021-12-25 LAB — HEMOGLOBIN A1C: Hgb A1c MFr Bld: 6.3 % (ref 4.6–6.5)

## 2021-12-25 NOTE — Assessment & Plan Note (Signed)
Chronic, stable.  Continue Cymbalta 60 mg, Xanax 0.25 mg rare use.

## 2021-12-25 NOTE — Progress Notes (Signed)
Subjective:    Patient ID: Samantha [name], female    DOB: Aug 02, 1940, 81 y.o.   MRN: [id number]  CC: Samantha [name] is a 81 y.o. female who presents today for follow up.   HPI: Feels well today. No new complaints   She had consult with Dr. Trenton Gammon 12/13/2021 of [facility]. Per notes from Dr Sharlet Salina, spinal cord stimulator trial recommended and referral placed to Dr Cari Caraway whom she sees 01/16/22.   Depression-compliant Cymbalta 60 mg, rare use of xanax.  She feels well on current regimen  Hyperlipidemia-compliant with Lipitor 40 mg  History of prediabetes  History of vitamin D deficiency .she takes general MV daily.   HISTORY:  Past Medical History:  Diagnosis Date   A-fib [facility])    Arthritis    Chronic back pain    COPD (chronic obstructive pulmonary disease) (HCC)    Coronary artery disease    COVID-19    08/22/20 likely contracted late 07/2020   Glaucoma    Hearing aid worn    High cholesterol    Hypertension    Lymphadenopathy 01/25/2016   Myocardial infarction ([place])    2000   Non Hodgkin's lymphoma ([place])    Wears dentures    " Top plate"   Wears glasses    Past Surgical History:  Procedure Laterality Date   CARDIAC CATHETERIZATION     CATARACT EXTRACTION W/ INTRAOCULAR LENS IMPLANT     CHOLECYSTECTOMY     COLONOSCOPY W/ BIOPSIES AND POLYPECTOMY     CORONARY STENT PLACEMENT     INGUINAL LYMPH NODE BIOPSY Right 01/25/2016   Procedure: EXCISION DEEP RIGHT INGUINAL LYMPH NODE BIOPSY WITH ULTRA SOUND;  Surgeon: Fanny Skates, MD;  Location: [facility];  Service: General;  Laterality: Right;   Family History  Problem Relation Age of Onset   Hypertension Mother    Stroke Mother    Heart disease Father    Heart disease Other     Allergies: Hydrocodone and Oxycodone Current Outpatient Medications on File Prior to Visit  Medication Sig Dispense Refill   albuterol (VENTOLIN HFA) 108 (90 Base) MCG/ACT inhaler Inhale 1-2 puffs into the  lungs every 6 (six) hours as needed for wheezing or shortness of breath. 18 each 2   amiodarone (PACERONE) 200 MG tablet Take 0.5 tablets (100 mg total) by mouth every morning. 90 tablet 3   amLODipine (NORVASC) 5 MG tablet Take 0.5 tablets (2.5 mg total) by mouth daily. 45 tablet 3   atorvastatin (LIPITOR) 40 MG tablet Take 1 tablet (40 mg total) by mouth every morning. 90 tablet 3   budesonide-formoterol (SYMBICORT) 160-4.5 MCG/ACT inhaler Inhale 2 puffs into the lungs 2 (two) times daily. Rinse mouth stop 80-4.5 1 each 5   DULoxetine (CYMBALTA) 60 MG capsule TAKE 1 CAPSULE BY MOUTH EVERY DAY 90 capsule 1   ELIQUIS 5 MG TABS tablet Take 1 tablet (5 mg total) by mouth 2 (two) times daily. 180 tablet 3   fluticasone (FLONASE) 50 MCG/ACT nasal spray Place 2 sprays into both nostrils daily. 16 g 3   metoprolol succinate (TOPROL-XL) 50 MG 24 hr tablet Take 1 tablet (50 mg total) by mouth daily. 90 tablet 3   nitroGLYCERIN (NITROSTAT) 0.4 MG SL tablet Place 1 tablet (0.4 mg total) under the tongue every 5 (five) minutes x 3 doses as needed for chest pain. 25 tablet 0   timolol (TIMOPTIC) 0.5 % ophthalmic solution Place 1 drop into both eyes 2 (  two) times daily. Instill 1 drop in each eye in the morning and one drop in each eye at night     ALPRAZolam (XANAX) 0.25 MG tablet Take 1 tablet (0.25 mg total) by mouth daily as needed for anxiety. (Patient not taking: Reported on 12/25/2021) 30 tablet 1   cetirizine (ZYRTEC) 10 MG tablet TAKE 1 TABLET BY MOUTH EVERY DAY AS NEEDED FOR ALLERGY (Patient not taking: Reported on 10/16/2021) 90 tablet 3   No current facility-administered medications on file prior to visit.    Social History   Tobacco Use   Smoking status: Every Day    Packs/day: 0.50    Years: 60.00    Total pack years: 30.00    Types: Cigarettes   Smokeless tobacco: Never  Vaping Use   Vaping Use: Never used  Substance Use Topics   Alcohol use: Yes    Comment: social   Drug use: No     Review of Systems  Constitutional:  Negative for chills and fever.  Respiratory:  Negative for cough.   Cardiovascular:  Negative for chest pain and palpitations.  Gastrointestinal:  Negative for nausea and vomiting.      Objective:    BP 128/70 (BP Location: Left Arm, Patient Position: Sitting, Cuff Size: Normal)   Pulse 67   Temp 97.7 F (36.5 C) (Oral)   Ht '5\' 9"'$  (1.753 m)   Wt 205 lb 12.8 oz (93.4 kg)   SpO2 95%   BMI 30.39 kg/m  BP Readings from Last 3 Encounters:  12/25/21 128/70  11/03/21 126/70  10/16/21 124/68   Wt Readings from Last 3 Encounters:  12/25/21 205 lb 12.8 oz (93.4 kg)  11/03/21 202 lb 12.8 oz (92 kg)  10/16/21 201 lb (91.2 kg)    Physical Exam Vitals reviewed.  Constitutional:      Appearance: She is well-developed.  Eyes:     Conjunctiva/sclera: Conjunctivae normal.  Cardiovascular:     Rate and Rhythm: Normal rate and regular rhythm.     Pulses: Normal pulses.     Heart sounds: Normal heart sounds.  Pulmonary:     Effort: Pulmonary effort is normal.     Breath sounds: Normal breath sounds. No wheezing, rhonchi or rales.  Skin:    General: Skin is warm and dry.  Neurological:     Mental Status: She is alert.  Psychiatric:        Speech: Speech normal.        Behavior: Behavior normal.        Thought Content: Thought content normal.        Assessment & Plan:   Problem List Items Addressed This Visit       Other   Depression, major, single episode, complete remission (HCC)    Chronic, stable.  Continue Cymbalta 60 mg, Xanax 0.25 mg rare use.       Prediabetes - Primary    Pending A1c.  Will follow closely      Relevant Orders   Hemoglobin A1c   Other Visit Diagnoses     Vitamin D deficiency       Relevant Orders   VITAMIN D 25 Hydroxy (Vit-D Deficiency, Fractures)        I am having Samantha [name] maintain her timolol, fluticasone, albuterol, budesonide-formoterol, cetirizine, nitroGLYCERIN, amiodarone,  amLODipine, atorvastatin, Eliquis, metoprolol succinate, DULoxetine, and ALPRAZolam.   No orders of the defined types were placed in this encounter.   Return precautions given.   Risks, benefits,  and alternatives of the medications and treatment plan prescribed today were discussed, and patient expressed understanding.   Education regarding symptom management and diagnosis given to patient on AVS.  Continue to follow with Burnard Hawthorne, FNP for routine health maintenance.   Velora Mediate and I agreed with plan.   Mable Paris, FNP

## 2021-12-25 NOTE — Patient Instructions (Signed)
  For post menopausal women, guidelines recommend a diet with 1200 mg of Calcium per day. If you are eating calcium rich foods, you do not need a calcium supplement. The body better absorbs the calcium that you eat over supplementation. If you do supplement, I recommend not supplementing the full 1200 mg/ day as this can lead to increased risk of cardiovascular disease. I recommend Calcium Citrate over the counter, and you may take a total of 600 to 800 mg per day in divided doses with meals for best absorption.   For bone health, you need adequate vitamin D, and I recommend you supplement as it is harder to do so with diet alone. I recommend cholecalciferol 800 units daily.  Also, please ensure you are following a diet high in calcium -- research shows better outcomes with dietary sources including kale, yogurt, broccolii, cheese, okra, almonds- to name a few.     Also remember that exercise is a great medicine for maintain and preserve bone health. Advise moderate exercise for 30 minutes , 3 times per week.   

## 2021-12-25 NOTE — Assessment & Plan Note (Signed)
Pending A1c.  Will follow closely

## 2022-01-09 ENCOUNTER — Encounter: Payer: Self-pay | Admitting: Family

## 2022-01-15 ENCOUNTER — Other Ambulatory Visit: Payer: Self-pay | Admitting: Cardiovascular Disease

## 2022-01-15 DIAGNOSIS — I480 Paroxysmal atrial fibrillation: Secondary | ICD-10-CM

## 2022-01-15 NOTE — Progress Notes (Unsigned)
Referring Physician:  Sharlet Salina, MD [place],  [place] [address]  Primary Physician:  Burnard Hawthorne, FNP  History of Present Illness: 01/16/2022  She was seeing Dr. Sharlet Salina and per his notes, he referred her back to Dr. Trenton Gammon who recommended SCS trial.   She has more constant left sided LBP with left lateral leg pain to her foot x years. No significant right sided pain. She has pain with walking, she does better with a shopping cart. Legs feel heavy. She has weakness in left leg.  No bowel or bladder dysfunction.   She is on ELIQUIS.   History of COPD, glaucoma, TIA, hyperlipidemia, HTN, MI, hypothyroidism, and follicular lymphoma.   Conservative measures:  Physical therapy: has seen chiropractor with no relief, no relief with previous PT.   Multimodal medical therapy including regular antiinflammatories: mobic, neurontin, cymbalta, and ultram.   Injections: 10/25/2021: Left L4-5 and left S1 transforaminal ESI (good relief x 1 day) 02/22/2020: Left L2-3 and left L3-4 transforaminal ESI (no relief) 10/14/2019: Left trochanteric bursa injection (good relief) 09/03/2019: Left L4-5 and left S1 transforaminal ESI (mild to moderate relief for less one week) 08/06/2019: Left L5-S1 and left S1 transforaminal ESI (mild relief) 04/25/2018: L4-5 fusion by Dr. Annette Stable  06/21/2017: Bilateral L5-S1 transforaminal ESI (moderate to good relief) 05/21/2017: Bilateral L5-S1 transforaminal ESI (mild to moderate relief) 10/25/2016: Left L5-S1 and left S1 transforaminal ESI (50% relief, stayed on Eliquis) 10/04/2016: Left L5-S1 and left S1 transforaminal ESI (stayed on Eliquis) 08/31/2015: Bilateral L5-S1 transforaminal ESI (mild relief, stayed on Eliquis) 07/15/2015: Bilateral L5-S1 transforaminal ESI (moderate relief, stayed on Eliquis) 06/24/2015: Bilateral L5-S1 transforaminal ESI 05/21/2013: Left L5-S1 transforaminal ESI (good relief, dexamethasone 10 mg, better  tolerated) 02/04/2013: Left L5-S1 transforaminal ESI (good relief, dexamethasone 10 mg, significant flushing)  Past Surgery: L4-L5 fusion by Dr. Shary Key has no symptoms of cervical myelopathy.  The symptoms are causing a significant impact on the patient's life.   Review of Systems:  A 10 point review of systems is negative, except for the pertinent positives and negatives detailed in the HPI.  Past Medical History: Past Medical History:  Diagnosis Date   A-fib [facility])    Arthritis    Chronic back pain    COPD (chronic obstructive pulmonary disease) ([place])    Coronary artery disease    COVID-19    08/22/20 likely contracted late 07/2020   Glaucoma    Hearing aid worn    High cholesterol    Hypertension    Lymphadenopathy 01/25/2016   Myocardial infarction ([place])    2000   Non Hodgkin's lymphoma ([place])    Wears dentures    " Top plate"   Wears glasses     Past Surgical History: Past Surgical History:  Procedure Laterality Date   CARDIAC CATHETERIZATION     CATARACT EXTRACTION W/ INTRAOCULAR LENS IMPLANT     CHOLECYSTECTOMY     COLONOSCOPY W/ BIOPSIES AND POLYPECTOMY     CORONARY STENT PLACEMENT     INGUINAL LYMPH NODE BIOPSY Right 01/25/2016   Procedure: EXCISION DEEP RIGHT INGUINAL LYMPH NODE BIOPSY WITH ULTRA SOUND;  Surgeon: Fanny Skates, MD;  Location: [facility];  Service: General;  Laterality: Right;    Allergies: Allergies as of 01/16/2022 - Review Complete 01/16/2022  Allergen Reaction Noted   Hydrocodone Rash 08/05/2013   Oxycodone Rash 09/19/2015    Medications: Outpatient Encounter Medications as of 01/16/2022  Medication Sig   ALPRAZolam (XANAX) 0.25  MG tablet Take 1 tablet (0.25 mg total) by mouth daily as needed for anxiety.   amiodarone (PACERONE) 200 MG tablet Take 0.5 tablets (100 mg total) by mouth every morning.   amLODipine (NORVASC) 5 MG tablet Take 0.5 tablets (2.5 mg total) by mouth daily.   apixaban (ELIQUIS) 5 MG TABS tablet TAKE 1  TABLET BY MOUTH TWICE A DAY   atorvastatin (LIPITOR) 40 MG tablet Take 1 tablet (40 mg total) by mouth every morning.   cetirizine (ZYRTEC) 10 MG tablet TAKE 1 TABLET BY MOUTH EVERY DAY AS NEEDED FOR ALLERGY   DULoxetine (CYMBALTA) 60 MG capsule TAKE 1 CAPSULE BY MOUTH EVERY DAY   fluticasone (FLONASE) 50 MCG/ACT nasal spray Place 2 sprays into both nostrils daily.   metoprolol succinate (TOPROL-XL) 50 MG 24 hr tablet Take 1 tablet (50 mg total) by mouth daily.   nitroGLYCERIN (NITROSTAT) 0.4 MG SL tablet Place 1 tablet (0.4 mg total) under the tongue every 5 (five) minutes x 3 doses as needed for chest pain.   timolol (TIMOPTIC) 0.5 % ophthalmic solution Place 1 drop into both eyes 2 (two) times daily. Instill 1 drop in each eye in the morning and one drop in each eye at night   [DISCONTINUED] albuterol (VENTOLIN HFA) 108 (90 Base) MCG/ACT inhaler Inhale 1-2 puffs into the lungs every 6 (six) hours as needed for wheezing or shortness of breath. (Patient not taking: Reported on 01/16/2022)   [DISCONTINUED] budesonide-formoterol (SYMBICORT) 160-4.5 MCG/ACT inhaler Inhale 2 puffs into the lungs 2 (two) times daily. Rinse mouth stop 80-4.5 (Patient not taking: Reported on 01/16/2022)   [DISCONTINUED] ELIQUIS 5 MG TABS tablet Take 1 tablet (5 mg total) by mouth 2 (two) times daily.   No facility-administered encounter medications on file as of 01/16/2022.    Social History: Social History   Tobacco Use   Smoking status: Every Day    Packs/day: 0.50    Years: 60.00    Total pack years: 30.00    Types: Cigarettes   Smokeless tobacco: Never  Vaping Use   Vaping Use: Never used  Substance Use Topics   Alcohol use: Yes    Comment: social   Drug use: No    Family Medical History: Family History  Problem Relation Age of Onset   Hypertension Mother    Stroke Mother    Heart disease Father    Heart disease Other     Physical Examination: Vitals:   01/16/22 0952  BP: 128/68     General: Patient is well developed, well nourished, calm, collected, and in no apparent distress. Attention to examination is appropriate.  Respiratory: Patient is breathing without any difficulty.   NEUROLOGICAL:     Awake, alert, oriented to person, place, and time.  Speech is clear and fluent. Fund of knowledge is appropriate.   Cranial Nerves: Pupils equal round and reactive to light.  Facial tone is symmetric.  Facial sensation is symmetric.  Lumbar incision is well healed.   Strength: Side Biceps Triceps Deltoid Interossei Grip Wrist Ext. Wrist Flex.  R '5 5 5 5 5 5 5  '$ L '5 5 5 5 5 5 5   '$ Side Iliopsoas Quads Hamstring PF DF EHL  R '5 5 5 5 5 5  '$ L '5 5 5 5 5 5   '$ Reflexes are 1+ and symmetric at the biceps, triceps, brachioradialis, patella and achilles.   Hoffman's is absent.  Clonus is not present.   Bilateral upper and lower extremity sensation  is intact to light touch.     She ambulates with a cane.     Medical Decision Making  Imaging: Lumbar spine MRI dated 12/08/21:  FINDINGS: Segmentation:  Standard.   Alignment:  Physiologic.   Vertebrae: No acute fracture, evidence of discitis, or aggressive bone lesion.   Conus medullaris and cauda equina: Conus extends to the L1 level. Conus and cauda equina appear normal.   Paraspinal and other soft tissues: No acute paraspinal abnormality.   Disc levels:   Disc spaces: Posterior lumbar interbody fusion at L4-5. Degenerative disease with disc height loss at T12-L1, L1-2, L2-3 and L5-S1.   T12-L1: Broad-based disc bulge with a small central disc protrusion. Mild bilateral facet arthropathy. No foraminal or central canal stenosis.   L1-L2: Broad-based disc bulge. Mild bilateral facet arthropathy. Mild spinal stenosis. Bilateral lateral recess narrowing. No significant foraminal stenosis.   L2-L3: Broad-based disc bulge. Mild bilateral facet arthropathy. Mild spinal stenosis. Bilateral lateral recess  stenosis. No significant foraminal stenosis.   L3-L4: Broad-based disc bulge. Moderate bilateral facet arthropathy. Moderate spinal stenosis. Bilateral subarticular recess stenosis. Mild right foraminal stenosis. No left foraminal stenosis.   L4-L5: Interbody fusion.  No foraminal or central canal stenosis.   L5-S1: Broad-based disc bulge with a central disc protrusion. Moderate bilateral facet arthropathy. Severe left foraminal stenosis. Moderate right foraminal stenosis. No spinal stenosis.   IMPRESSION: 1. Diffuse lumbar spine spondylosis as described above. 2. No acute osseous injury of the lumbar spine.     Electronically Signed   By: Kathreen Devoid M.D.   On: 12/11/2021 11:20    I have personally reviewed the images and agree with the above interpretation.  Above imaging reviewed with Dr. Izora Ribas as well.   Assessment and Plan: Ms. Kakos is a pleasant 81 y.o. female with more constant left sided LBP with left lateral leg pain to her foot x years. She has pain with walking, she does better with a shopping cart. Legs feel heavy.   History of L4-L5 fusion with Dr. Trenton Gammon. She has known mild spinal stenosis at L1-L3 along with lumbar spondylosis.   Treatment options discussed with patient and following plan made:   - Dr. Izora Ribas reviewed her imaging and agrees with referral for SCS trial.  - We discussed SCS and trial at length. She would like to proceed. Given booklets to review.  - MRI of thoracic spine ordered due to possible SCS.  - Psych consult recommended as well. She would like to see Dr. Sima Matas in [place]. Referral sent and she will call to schedule appointment.  - Referral to pain clinic [facility]) for SCS trial.  - Will call her with thoracic MRI results.   I spent a total of 30 minutes in face-to-face and non-face-to-face activities related to this patient's care toda including review of outside records, review of imaging, review of symptoms, physical  exam, discussion of differential diagnosis, discussion of treatment options, and documentation.   Thank you for involving me in the care of this patient.   Geronimo Boot PA-C Dept. of Neurosurgery

## 2022-01-15 NOTE — Telephone Encounter (Signed)
Refill request

## 2022-01-15 NOTE — Telephone Encounter (Signed)
Eliquis '5mg'$  refill request received. Patient is 81 years old, weight-93.4kg, Crea-0.92 on 08/23/2021, Diagnosis-Afib, and last seen by Dr. Rockey Situ on 10/16/2021. Dose is appropriate based on dosing criteria. Will send in refill to requested pharmacy.

## 2022-01-16 ENCOUNTER — Ambulatory Visit: Payer: PPO | Admitting: Orthopedic Surgery

## 2022-01-16 ENCOUNTER — Encounter: Payer: Self-pay | Admitting: Orthopedic Surgery

## 2022-01-16 VITALS — BP 128/68 | Ht 69.0 in | Wt 200.0 lb

## 2022-01-16 DIAGNOSIS — G894 Chronic pain syndrome: Secondary | ICD-10-CM | POA: Diagnosis not present

## 2022-01-16 DIAGNOSIS — Z981 Arthrodesis status: Secondary | ICD-10-CM

## 2022-01-16 DIAGNOSIS — M48061 Spinal stenosis, lumbar region without neurogenic claudication: Secondary | ICD-10-CM | POA: Diagnosis not present

## 2022-01-16 DIAGNOSIS — M5416 Radiculopathy, lumbar region: Secondary | ICD-10-CM

## 2022-01-16 NOTE — Patient Instructions (Signed)
It was so nice to see you today, I am sorry that you are hurting so much.   Dr. Izora Ribas and I agree that a spinal cord stimulator trial is a good option for your pain.   Look over the booklets that I gave you to learn more.   I want to get an MRI of your mid back to make sure you are can have a spinal cord stimulator. [facility] will call you to schedule this.   You need to get a psychiatric evaluation prior to a spinal cord stimulator trial. See below.   Dr Sima Matas in [place] Dr Norton Pastel in [place] - also does televisits [phone] Dr Arlana Pouch in [place] [phone] Dr Velta Addison in [place] - also does televisits [phone] [facility] (offices in [place], [place], and [place]) [place] [phone]   Please let us know which one of the above psychologist's you would like to see for evaluation prior to the spinal cord stimulator trial. These are the only providers we are aware of that perform this type of evaluation. Once we fax the referral, you are required to call them to set up an appointment (they will not call you).   I will call you with results of your thoracic MRI.   I put in a referral with the pain clinic as they do the stimulator trial. They will call you to schedule.   Please do not hesitate to call if you have any questions or concerns. You can also message me in [place].   Geronimo Boot PA-C [phone]

## 2022-01-24 DIAGNOSIS — M5416 Radiculopathy, lumbar region: Secondary | ICD-10-CM | POA: Diagnosis not present

## 2022-01-24 DIAGNOSIS — M48062 Spinal stenosis, lumbar region with neurogenic claudication: Secondary | ICD-10-CM | POA: Diagnosis not present

## 2022-01-24 DIAGNOSIS — M961 Postlaminectomy syndrome, not elsewhere classified: Secondary | ICD-10-CM | POA: Diagnosis not present

## 2022-01-26 ENCOUNTER — Other Ambulatory Visit: Payer: Self-pay | Admitting: Family

## 2022-01-29 ENCOUNTER — Telehealth: Payer: Self-pay

## 2022-01-30 DIAGNOSIS — H40153 Residual stage of open-angle glaucoma, bilateral: Secondary | ICD-10-CM | POA: Diagnosis not present

## 2022-02-01 ENCOUNTER — Ambulatory Visit
Admission: RE | Admit: 2022-02-01 | Discharge: 2022-02-01 | Disposition: A | Discharge status: Home / Self Care | Payer: PPO | Source: Ambulatory Visit | Attending: Orthopedic Surgery | Admitting: Orthopedic Surgery

## 2022-02-01 DIAGNOSIS — M5416 Radiculopathy, lumbar region: Secondary | ICD-10-CM | POA: Insufficient documentation

## 2022-02-01 DIAGNOSIS — Z981 Arthrodesis status: Secondary | ICD-10-CM | POA: Diagnosis not present

## 2022-02-01 DIAGNOSIS — G894 Chronic pain syndrome: Secondary | ICD-10-CM | POA: Insufficient documentation

## 2022-02-01 DIAGNOSIS — M48061 Spinal stenosis, lumbar region without neurogenic claudication: Secondary | ICD-10-CM | POA: Insufficient documentation

## 2022-02-01 DIAGNOSIS — M546 Pain in thoracic spine: Secondary | ICD-10-CM | POA: Diagnosis not present

## 2022-02-05 NOTE — Progress Notes (Unsigned)
Telephone Visit- Progress Note: Referring Physician:  Burnard Hawthorne, FNP [address] [place],  [place] [address]  Primary Physician:  Burnard Hawthorne, FNP  This visit was performed via telephone.  Patient location: home Provider location: office  I spent a total of 10 minutes non-face-to-face activities for this visit on the date of this encounter including review of current clinical condition and response to treatment.    Chief Complaint:  review thoracic MRI results  History of Present Illness: Samantha [name] is a 81 y.o. female was last seen be me on 01/16/22 for more constant left sided LBP with left lateral leg pain to her foot x years. She has pain with walking, she does better with a shopping cart. Legs feel heavy.    History of L4-L5 fusion with Dr. Trenton Gammon. She has known mild spinal stenosis at L1-L3 along with lumbar spondylosis.   MRI of thoracic spine done to see if she is a candidate for SCS trial. She was to set up psych consult and was referred to Dr. Holley Raring.   She said she has called to make an appointment with Dr. Sima Matas for psych consult. She looked over information I gave her about SCS and still wants to proceed with trial.   She continues with more constant left sided LBP with left lateral leg pain to her foot.    Exam: No exam done as this was a telephone encounter.     Imaging: MRI of thoracic spine dated 02/01/22:  FINDINGS: Alignment:  Physiologic.   Vertebrae: No acute fracture, evidence of discitis, or aggressive bone lesion.   Cord:  Normal signal and morphology.   Paraspinal and other soft tissues: No acute paraspinal abnormality.   Disc levels:   Disc spaces: Degenerative disease with disc height loss C6-7. Degenerative disease with disc height loss at T6-7, T7-8 and T8-9. Mild disc height loss at T12-L1.   C6-7: Broad-based disc bulge with bilateral uncovertebral degenerative changes and bilateral foraminal  stenosis partially visualized.   C7-T1: Moderate-severe right foraminal stenosis. Mild left foraminal stenosis.   T1-T2: No disc protrusion, foraminal stenosis or central canal stenosis.   T2-T3: No disc protrusion, foraminal stenosis or central canal stenosis.   T3-T4: No disc protrusion, foraminal stenosis or central canal stenosis.   T4-T5: No disc protrusion, foraminal stenosis or central canal stenosis.   T5-T6: Small right paracentral disc protrusion. No foraminal or central canal stenosis.   T6-T7: Mild broad-based disc bulge. Mild bilateral foraminal stenosis. No spinal stenosis.   T7-T8: Mild broad-based disc bulge. Mild bilateral foraminal stenosis. No spinal stenosis.   T8-T9: Mild broad-based disc bulge. Mild bilateral foraminal stenosis. No spinal stenosis.   T9-T10: No disc protrusion, foraminal stenosis or central canal stenosis. Mild bilateral facet arthropathy.   T10-T11: Minimal broad-based disc bulge. Mild bilateral facet arthropathy. Mild left foraminal stenosis. No right foraminal stenosis. No spinal stenosis.   T11-T12: Mild broad-based disc bulge. No foraminal or central canal stenosis. Mild bilateral facet arthropathy.   T12-L1: Mild broad-based disc bulge. Moderate bilateral facet arthropathy. Mild left foraminal stenosis. No right foraminal stenosis.   IMPRESSION: 1. No acute osseous injury of the thoracic spine. 2. Mild thoracic spine spondylosis as described above.     Electronically Signed   By: Kathreen Devoid M.D.   On: 02/03/2022 07:55   I have personally reviewed the images and agree with the above interpretation.  Assessment and Plan: Samantha [name] is a pleasant 81 y.o. female  with more constant left sided LBP with left lateral leg pain to her foot x years. She has pain with walking, she does better with a shopping cart. Legs feel heavy.    History of L4-L5 fusion with Dr. Trenton Gammon. She has known mild spinal stenosis at L1-L3 along  with lumbar spondylosis.   Treatment options discussed with patient and following plan made:   - Continue to agree with trial of SCS.  - Referral to Dr. Holley Raring in pain management for this.  - She will f/u with [place] prn.   Geronimo Boot PA-C Neurosurgery

## 2022-02-06 ENCOUNTER — Ambulatory Visit (INDEPENDENT_AMBULATORY_CARE_PROVIDER_SITE_OTHER): Payer: PPO | Admitting: Orthopedic Surgery

## 2022-02-06 ENCOUNTER — Encounter: Payer: Self-pay | Admitting: Orthopedic Surgery

## 2022-02-06 DIAGNOSIS — M5416 Radiculopathy, lumbar region: Secondary | ICD-10-CM | POA: Diagnosis not present

## 2022-02-06 DIAGNOSIS — G894 Chronic pain syndrome: Secondary | ICD-10-CM

## 2022-02-06 DIAGNOSIS — M48061 Spinal stenosis, lumbar region without neurogenic claudication: Secondary | ICD-10-CM

## 2022-02-06 DIAGNOSIS — Z981 Arthrodesis status: Secondary | ICD-10-CM

## 2022-02-28 ENCOUNTER — Other Ambulatory Visit: Payer: Self-pay | Admitting: Family

## 2022-02-28 ENCOUNTER — Telehealth: Payer: Self-pay | Admitting: Family

## 2022-02-28 DIAGNOSIS — F325 Major depressive disorder, single episode, in full remission: Secondary | ICD-10-CM

## 2022-02-28 DIAGNOSIS — F419 Anxiety disorder, unspecified: Secondary | ICD-10-CM

## 2022-02-28 MED ORDER — ALPRAZOLAM 0.25 MG PO TABS: 0.2500 mg | ORAL_TABLET | Freq: Every day | ORAL | 1 refills | Status: DC | PRN

## 2022-02-28 MED ORDER — DULOXETINE HCL 60 MG PO CPEP: 60.0000 mg | ORAL_CAPSULE | Freq: Every day | ORAL | 3 refills | Status: DC

## 2022-02-28 NOTE — Telephone Encounter (Signed)
Call patient I have refilled Xanax and Cymbalta to [facility] as requested.    She will need to call cardiology office, Dr. Rockey Situ to request refill of nitro, Eliquis, amiodarone and amlodipine to be prescribed fby [facility]    I looked up patient on [place] Controlled Substances Reporting System PMP AWARE and saw no activity that raised concern of inappropriate use.

## 2022-03-01 ENCOUNTER — Telehealth: Payer: Self-pay | Admitting: Cardiovascular Disease

## 2022-03-01 NOTE — Telephone Encounter (Signed)
   Pre-operative Risk Assessment    Patient Name: Samantha [name]  DOB: 1940-08-23 MRN: [id number]      Request for Surgical Clearance    Procedure:   Spinal Cord Stimulator Trial   Date of Surgery:  Clearance TBD                                 Surgeon:  not indicated Surgeon's Group or Practice Name:  [facility] Phone number:  [phone] Fax number:  [phone]   Type of Clearance Requested:   - Pharmacy:  Hold Apixaban (Eliquis) discontinue 3 days prior and 5 days after   Type of Anesthesia:  Not Indicated   Additional requests/questions:    Manfred Arch   03/01/2022, 3:44 PM

## 2022-03-01 NOTE — Telephone Encounter (Signed)
Sent mychart message

## 2022-03-02 NOTE — Telephone Encounter (Signed)
Patient with diagnosis of afib on Eliquis for anticoagulation.    Procedure: Spinal Cord Stimulator Trial  Date of procedure: 03/16/22  CHA2DS2-VASc Score = 5   This indicates a 7.2% annual risk of stroke. The patient's score is based upon: CHF History: 0 HTN History: 1 Diabetes History: 0 Stroke History: 0 Vascular Disease History: 1 Age Score: 2 Gender Score: 1   CrCl 70m/min using adjusted body weight Platelet count 269K  Per office protocol, patient can hold Eliquis as requested for 3 days prior and 5 days post procedure.    **This guidance is not considered finalized until pre-operative APP has relayed final recommendations.**

## 2022-03-02 NOTE — Telephone Encounter (Signed)
Left message for the pt to call to schedule tele pre op appt.

## 2022-03-02 NOTE — Telephone Encounter (Signed)
Primary Cardiologist:Timothy Rockey Situ, MD   Preoperative team, please contact this patient and set up a phone call appointment for further preoperative risk assessment. Please obtain consent and complete medication review. Thank you for your help.   I confirm that guidance regarding antiplatelet and oral anticoagulation therapy has been completed and, if necessary, noted below.   Emmaline Life, NP-C  03/02/2022, 12:47 PM [address] Office [phone] Fax [phone]

## 2022-03-05 ENCOUNTER — Telehealth: Payer: Self-pay | Admitting: *Deleted

## 2022-03-05 ENCOUNTER — Other Ambulatory Visit: Payer: Self-pay

## 2022-03-05 ENCOUNTER — Ambulatory Visit (INDEPENDENT_AMBULATORY_CARE_PROVIDER_SITE_OTHER): Payer: PPO | Admitting: Family

## 2022-03-05 ENCOUNTER — Encounter: Payer: Self-pay | Admitting: Family

## 2022-03-05 VITALS — BP 122/78 | HR 60 | Temp 98.0°F | Ht 69.0 in | Wt 199.2 lb

## 2022-03-05 DIAGNOSIS — F325 Major depressive disorder, single episode, in full remission: Secondary | ICD-10-CM

## 2022-03-05 DIAGNOSIS — I700 Atherosclerosis of aorta: Secondary | ICD-10-CM | POA: Diagnosis not present

## 2022-03-05 MED ORDER — METOPROLOL SUCCINATE ER 50 MG PO TB24: 50.0000 mg | ORAL_TABLET | Freq: Every day | ORAL | 3 refills | Status: DC

## 2022-03-05 MED ORDER — ATORVASTATIN CALCIUM 40 MG PO TABS: ORAL_TABLET | ORAL | 3 refills | Status: DC

## 2022-03-05 MED ORDER — TIMOLOL MALEATE 0.5 % OP SOLN: 1.0000 [drp] | Freq: Two times a day (BID) | OPHTHALMIC | 0 refills | Status: DC

## 2022-03-05 NOTE — Telephone Encounter (Signed)
Pt agreeable to tele pre op appt 03/07/22 @ 2:40. Med rec and consent are done.     Patient Consent for Virtual Visit        Samantha [name] has provided verbal consent on 03/05/2022 for a virtual visit (video or telephone).   CONSENT FOR VIRTUAL VISIT FOR:  Samantha [name]  By participating in this virtual visit I agree to the following:  I hereby voluntarily request, consent and authorize [facility] and its employed or contracted physicians, physician assistants, nurse practitioners or other licensed health care professionals (the Practitioner), to provide me with telemedicine health care services (the "Services") as deemed necessary by the treating Practitioner. I acknowledge and consent to receive the Services by the Practitioner via telemedicine. I understand that the telemedicine visit will involve communicating with the Practitioner through live audiovisual communication technology and the disclosure of certain medical information by electronic transmission. I acknowledge that I have been given the opportunity to request an in-person assessment or other available alternative prior to the telemedicine visit and am voluntarily participating in the telemedicine visit.  I understand that I have the right to withhold or withdraw my consent to the use of telemedicine in the course of my care at any time, without affecting my right to future care or treatment, and that the Practitioner or I may terminate the telemedicine visit at any time. I understand that I have the right to inspect all information obtained and/or recorded in the course of the telemedicine visit and may receive copies of available information for a reasonable fee.  I understand that some of the potential risks of receiving the Services via telemedicine include:  Delay or interruption in medical evaluation due to technological equipment failure or disruption; Information transmitted may not be sufficient (e.g. poor  resolution of images) to allow for appropriate medical decision making by the Practitioner; and/or  In rare instances, security protocols could fail, causing a breach of personal health information.  Furthermore, I acknowledge that it is my responsibility to provide information about my medical history, conditions and care that is complete and accurate to the best of my ability. I acknowledge that Practitioner's advice, recommendations, and/or decision may be based on factors not within their control, such as incomplete or inaccurate data provided by me or distortions of diagnostic images or specimens that may result from electronic transmissions. I understand that the practice of medicine is not an exact science and that Practitioner makes no warranties or guarantees regarding treatment outcomes. I acknowledge that a copy of this consent can be made available to me via my patient portal ([facility]), or I can request a printed copy by calling the office of [facility].    I understand that my insurance will be billed for this visit.   I have read or had this consent read to me. I understand the contents of this consent, which adequately explains the benefits and risks of the Services being provided via telemedicine.  I have been provided ample opportunity to ask questions regarding this consent and the Services and have had my questions answered to my satisfaction. I give my informed consent for the services to be provided through the use of telemedicine in my medical care

## 2022-03-05 NOTE — Progress Notes (Signed)
Assessment & Plan:  Depression, major, single episode, complete remission (HCC) Assessment & Plan: Chronic, stable.  Continue Cymbalta 60 mg qd, Xanax 0.25 mg rare use.    Atherosclerosis of aorta [facility]) Assessment & Plan: Chronic, stable.  Continue Lipitor '40mg'$  QD. LDL 43.      Return precautions given.   Risks, benefits, and alternatives of the medications and treatment plan prescribed today were discussed, and patient expressed understanding.   Education regarding symptom management and diagnosis given to patient on AVS either electronically or printed.  Return in about 4 months (around 07/05/2022) for Follow Up Chronic Management.  Samantha Paris, Samantha [name]  Subjective:    Patient ID: Samantha [name], female    DOB: 1941-01-16, 81 y.o.   MRN: [id number]  CC: Samantha [name] is a 81 y.o. female who presents today for follow up.   HPI: Feels well today No complaints  Compliant with lipitor '40mg'$  qd.     Consult neurosurgery 02/06/22. Discussed SCS trial, surgery scheduled 03/16/22 She was referred to Dr. Holley Raring, consult scheduled 03/08/22.  She has cardiology preop telephone 12/20 to discuss eliquis.  Depression-compliant with Cymbalta 60 mg, Xanax 0.'25mg'$  . Regimen is working well for her. She drinks alcohol very rarely  Allergies: Hydrocodone and Oxycodone Current Outpatient Medications on File Prior to Visit  Medication Sig Dispense Refill   ALPRAZolam (XANAX) 0.25 MG tablet Take 1 tablet (0.25 mg total) by mouth daily as needed for anxiety. 30 tablet 1   amiodarone (PACERONE) 200 MG tablet Take 0.5 tablets (100 mg total) by mouth every morning. 90 tablet 3   amLODipine (NORVASC) 5 MG tablet Take 0.5 tablets (2.5 mg total) by mouth daily. 45 tablet 3   apixaban (ELIQUIS) 5 MG TABS tablet TAKE 1 TABLET BY MOUTH TWICE A DAY 180 tablet 1   cetirizine (ZYRTEC) 10 MG tablet TAKE 1 TABLET BY MOUTH EVERY DAY AS NEEDED FOR ALLERGY 90 tablet 3   DULoxetine (CYMBALTA) 60 MG capsule  Take 1 capsule (60 mg total) by mouth daily. 90 capsule 3   fluticasone (FLONASE) 50 MCG/ACT nasal spray Place 2 sprays into both nostrils daily. 16 g 3   nitroGLYCERIN (NITROSTAT) 0.4 MG SL tablet Place 1 tablet (0.4 mg total) under the tongue every 5 (five) minutes x 3 doses as needed for chest pain. 25 tablet 0   No current facility-administered medications on file prior to visit.    Review of Systems  Constitutional:  Negative for chills and fever.  Respiratory:  Negative for cough.   Cardiovascular:  Negative for chest pain and palpitations.  Gastrointestinal:  Negative for nausea and vomiting.  Musculoskeletal:  Positive for back pain.      Objective:    BP 122/78   Pulse 60   Temp 98 F (36.7 C) (Oral)   Ht '5\' 9"'$  (1.753 m)   Wt 199 lb 3.2 oz (90.4 kg)   SpO2 96%   BMI 29.42 kg/m  BP Readings from Last 3 Encounters:  03/05/22 122/78  01/16/22 128/68  12/25/21 128/70   Wt Readings from Last 3 Encounters:  03/05/22 199 lb 3.2 oz (90.4 kg)  01/16/22 200 lb (90.7 kg)  12/25/21 205 lb 12.8 oz (93.4 kg)    Physical Exam Vitals reviewed.  Constitutional:      Appearance: She is well-developed.  Eyes:     Conjunctiva/sclera: Conjunctivae normal.  Cardiovascular:     Rate and Rhythm: Normal rate and regular rhythm.     Pulses: Normal  pulses.     Heart sounds: Normal heart sounds.  Pulmonary:     Effort: Pulmonary effort is normal.     Breath sounds: Normal breath sounds. No wheezing, rhonchi or rales.  Skin:    General: Skin is warm and dry.  Neurological:     Mental Status: She is alert.  Psychiatric:        Speech: Speech normal.        Behavior: Behavior normal.        Thought Content: Thought content normal.

## 2022-03-05 NOTE — Telephone Encounter (Signed)
Pt agreeable to tele pre op appt 03/07/22 @ 2:40. Med rec and consent are done.    PROCEDURE IS 03/16/22

## 2022-03-05 NOTE — Assessment & Plan Note (Signed)
Chronic, stable.  Continue Cymbalta 60 mg qd, Xanax 0.25 mg rare use.

## 2022-03-05 NOTE — Assessment & Plan Note (Addendum)
Chronic, stable.  Continue Lipitor '40mg'$  QD. LDL 43.

## 2022-03-07 ENCOUNTER — Ambulatory Visit: Payer: PPO | Attending: Cardiovascular Disease | Admitting: Student

## 2022-03-07 DIAGNOSIS — Z01810 Encounter for preprocedural cardiovascular examination: Secondary | ICD-10-CM

## 2022-03-07 NOTE — Progress Notes (Signed)
Virtual Visit via Telephone Note   Because of Samantha [name]'s co-morbid illnesses, she is at least at moderate risk for complications without adequate follow up.  This format is felt to be most appropriate for this patient at this time.  The patient did not have access to video technology/had technical difficulties with video requiring transitioning to audio format only (telephone).  All issues noted in this document were discussed and addressed.  No physical exam could be performed with this format.  Please refer to the patient's chart for her consent to telehealth for [facility].  Evaluation Performed:  Preoperative Cardiovascular Risk Assessment _____________   Date:  03/07/2022   Patient ID:  Samantha [name], DOB 07/06/1940, MRN [id number] Patient Location:  Home Provider location:   Office  Primary Care Provider:  Burnard Hawthorne, FNP Primary Cardiologist:  Ida Rogue, MD  Chief Complaint / Patient Profile   Samantha [name] is a 81 y.o. year old female with a history of CAD with remote PCI, paroxysmal atrial fibrillation on Eliquis, hypertension, hyperlipidemia, COPD, chronic back pain, and Non-Hodgkin's lymhoma who is pending a spinal cord stimulator trial and presents today for telephonic preoperative cardiovascular risk assessment.  Past Medical History    Past Medical History:  Diagnosis Date   A-fib [facility])    Arthritis    Chronic back pain    COPD (chronic obstructive pulmonary disease) ([place])    Coronary artery disease    COVID-19    08/22/20 likely contracted late 07/2020   Glaucoma    Hearing aid worn    High cholesterol    Hypertension    Lymphadenopathy 01/25/2016   Myocardial infarction ([place])    2000   Non Hodgkin's lymphoma ([place])    Wears dentures    " Top plate"   Wears glasses    Past Surgical History:  Procedure Laterality Date   CARDIAC CATHETERIZATION     CATARACT EXTRACTION W/ INTRAOCULAR LENS IMPLANT     CHOLECYSTECTOMY      COLONOSCOPY W/ BIOPSIES AND POLYPECTOMY     CORONARY STENT PLACEMENT     INGUINAL LYMPH NODE BIOPSY Right 01/25/2016   Procedure: EXCISION DEEP RIGHT INGUINAL LYMPH NODE BIOPSY WITH ULTRA SOUND;  Surgeon: Fanny Skates, MD;  Location: [facility];  Service: General;  Laterality: Right;    Allergies  Allergies  Allergen Reactions   Hydrocodone Rash    Itching, rash   Oxycodone Rash    History of Present Illness    Samantha [name] is a 81 y.o. female who presents via audio/video conferencing for a telehealth visit today.  Patient was last seen in our office on 10/16/2021 by Dr. Rockey Situ.  At that time, she was stable from a cardiac standpoint with some chronic shortness of breath which was felt to be due from deconditioning.  She is now pending procedure as outlined above. Since her last visit, she has had no significant changes from a cardiac standpoint. She continues to have dyspnea on exertion but she states she has had this for a long time and it is stable. She states it is not any worse than her usual baseline. No shortness of breath at rest. No orthopnea, PND, or edema. No chest pain, palpitations, lightheadedness, dizziness, or syncope. She is able to complete >4.0 METS without any angina.  Home Medications    Prior to Admission medications   Medication Sig Start Date End Date Taking? Authorizing Provider  ALPRAZolam [name]) 0.25 MG tablet Take  1 tablet (0.25 mg total) by mouth daily as needed for anxiety. 02/28/22   Burnard Hawthorne, FNP  amiodarone (PACERONE) 200 MG tablet Take 0.5 tablets (100 mg total) by mouth every morning. 10/16/21   Minna Merritts, MD  amLODipine (NORVASC) 5 MG tablet Take 0.5 tablets (2.5 mg total) by mouth daily. 10/16/21   Minna Merritts, MD  apixaban (ELIQUIS) 5 MG TABS tablet TAKE 1 TABLET BY MOUTH TWICE A DAY 01/15/22   Gollan, Kathlene November, MD  atorvastatin (LIPITOR) 40 MG tablet *NEED OFFICE VISIT* TAKE 1 TABLET EVERY DAY 03/05/22   Burnard Hawthorne, FNP   cetirizine (ZYRTEC) 10 MG tablet TAKE 1 TABLET BY MOUTH EVERY DAY AS NEEDED FOR ALLERGY 05/22/21   Dutch Quint B, FNP  DULoxetine (CYMBALTA) 60 MG capsule Take 1 capsule (60 mg total) by mouth daily. 02/28/22   Burnard Hawthorne, FNP  fluticasone (FLONASE) 50 MCG/ACT nasal spray Place 2 sprays into both nostrils daily. 07/25/18   Burnard Hawthorne, FNP  metoprolol succinate (TOPROL-XL) 50 MG 24 hr tablet Take 1 tablet (50 mg total) by mouth daily. 03/05/22   Burnard Hawthorne, FNP  nitroGLYCERIN (NITROSTAT) 0.4 MG SL tablet Place 1 tablet (0.4 mg total) under the tongue every 5 (five) minutes x 3 doses as needed for chest pain. 10/12/21   Minna Merritts, MD  timolol (TIMOPTIC) 0.5 % ophthalmic solution Place 1 drop into both eyes 2 (two) times daily. Instill 1 drop in each eye in the morning and one drop in each eye at night 03/05/22   Burnard Hawthorne, FNP    Physical Exam    Vital Signs:  Samantha [name] does not have vital signs available for review today.  Given telephonic nature of communication, physical exam is limited. Alert and oriented x3. No acute distress. Normal affect. Speech and respirations are unlabored.  Accessory Clinical Findings    None  Assessment & Plan    Preoperative Cardiovascular Risk Assessment: Patient has an upcoming spinal cord stimulator trial planned. She is stable from a cardiac standpoint. She has chronic dyspnea on exertion but this is not new and is unchanged. No chest pain, shortness of breath at rest, acute CHF symptoms, palpitations, or syncope. She is able to complete >4.0 METS without any chest pain. Per Revised Cardiac Risk Index, considered low risk for an adverse cardiac event perioperatively.  Therefore, based on ACC/AHA guidelines, patient would be at acceptable risk for the planned procedure without further cardiovascular testing. Per Pharmacy and office protocol, patient can hold Eliquis as requested for 3 days prior and 5 days post  procedure.   A copy of this note will be routed to requesting surgeon.  Time:   Today, I have spent 5 minutes with the patient with telehealth technology discussing medical history, symptoms, and management plan.     Darreld Mclean, PA-C  03/07/2022, 2:53 PM

## 2022-03-08 ENCOUNTER — Ambulatory Visit
Admission: RE | Admit: 2022-03-08 | Discharge: 2022-03-08 | Disposition: A | Discharge status: Home / Self Care | Payer: PPO | Source: Ambulatory Visit | Attending: Student in an Organized Health Care Education/Training Program | Admitting: Student in an Organized Health Care Education/Training Program

## 2022-03-08 ENCOUNTER — Ambulatory Visit
Payer: PPO | Attending: Student in an Organized Health Care Education/Training Program | Admitting: Student in an Organized Health Care Education/Training Program

## 2022-03-08 ENCOUNTER — Encounter: Payer: Self-pay | Admitting: Student in an Organized Health Care Education/Training Program

## 2022-03-08 VITALS — BP 117/55 | HR 122 | Temp 97.2°F | Resp 18 | Ht 69.0 in | Wt 199.0 lb

## 2022-03-08 DIAGNOSIS — M5410 Radiculopathy, site unspecified: Secondary | ICD-10-CM | POA: Insufficient documentation

## 2022-03-08 DIAGNOSIS — M961 Postlaminectomy syndrome, not elsewhere classified: Secondary | ICD-10-CM | POA: Diagnosis not present

## 2022-03-08 NOTE — Progress Notes (Signed)
Patient: Samantha [name]  Service Category: E/M  Provider: Gillis Santa, MD  DOB: 11-16-1940  DOS: 03/08/2022  Referring Provider: Jola Babinski  MRN: [id number]  Setting: Ambulatory outpatient  PCP: Burnard Hawthorne, FNP  Type: New Patient  Specialty: Interventional Pain Management    Location: Office  Delivery: Face-to-face     Primary Reason(s) for Visit: Encounter for initial evaluation of one or more chronic problems (new to examiner) potentially causing chronic pain, and posing a threat to normal musculoskeletal function. (Level of risk: High) CC: Back Pain (lower)  HPI  Samantha [name] is a 81 y.o. year old, female patient, who comes for the first time to our practice referred by Geronimo Boot, PA-C for our initial evaluation of her chronic pain. She has Follicular lymphoma of lymph nodes of multiple regions ([place]); HTN (hypertension); Depression, major, single episode, complete remission ([place]); Routine physical examination; Chronic low back pain; Lumbar foraminal stenosis; Intermittent atrial fibrillation ([place]); Hoarseness; HLD (hyperlipidemia); SOB (shortness of breath); Atherosclerosis of aorta ([place]); Ground glass opacity present on imaging of lung; Mucus plugging of bronchi; Pulmonary artery hypertension ([place]); Coronary artery disease involving native coronary artery of native heart without angina pectoris; Abnormal CT scan of lung; DDD (degenerative disc disease), lumbar; B12 deficiency; Tinea pedis; COPD GOLD 2 / still smoking ; Cigarette smoker; Prediabetes; Lumbar post-laminectomy syndrome; and Radicular pain of left lower extremity on their problem list. Today she comes in for evaluation of her Back Pain (lower)  Pain Assessment: Location: Lower Back Radiating: down side of left leg to bottom of left foot; sometimes numbness/tingling of left toes Onset: More than a month ago Duration: Chronic pain Quality: Numbness, Tingling, Burning, Constant Severity: 8 /10 (subjective, self-reported  pain score)  Effect on ADL: limits daily activities Timing: Constant Modifying factors: sitting BP: (!) 117/55  HR: (!) 122  Onset and Duration: Present longer than 3 months Cause of pain: Unknown Severity: Getting worse, NAS-11 at its worse: 8/10, and NAS-11 on the average: 8/10 Timing: Not influenced by the time of the day, During activity or exercise, and After activity or exercise Aggravating Factors: Bending, Lifiting, Prolonged standing, Twisting, Walking, Walking uphill, and Working Alleviating Factors: Cold packs, Lying down, Resting, Sitting, and Sleeping Associated Problems: Constipation, Fatigue, Inability to control bladder (urine), Inability to control bowel, Numbness, and Weakness Quality of Pain: Toothache-like Previous Examinations or Tests: MRI scan Previous Treatments: Chiropractic manipulations, Epidural steroid injections, Physical Therapy, Steroid treatments by mouth, and TENS  Samantha [name] is a 81 year old female who presents with a chief complaint of low back pain with radiation into her left lateral leg.  She has a history of L4-L5 fusion with Dr. Trenton Gammon as well as associated lumbar spinal stenosis which is mild at L1-L3.  She is being referred from Geronimo Boot to consider a spinal cord stim trial.  Upon further chart review, the patient already has a stim trial set up with [facility] [place] on 03/16/2022.  I encouraged her to continue with that.  No follow-up with me needed.  Meds   Current Outpatient Medications:    ALPRAZolam (XANAX) 0.25 MG tablet, Take 1 tablet (0.25 mg total) by mouth daily as needed for anxiety., Disp: 30 tablet, Rfl: 1   amiodarone (PACERONE) 200 MG tablet, Take 0.5 tablets (100 mg total) by mouth every morning., Disp: 90 tablet, Rfl: 3   amLODipine (NORVASC) 5 MG tablet, Take 0.5 tablets (2.5 mg total) by mouth daily., Disp: 45 tablet, Rfl: 3   apixaban (ELIQUIS)  5 MG TABS tablet, TAKE 1 TABLET BY MOUTH TWICE A DAY, Disp: 180 tablet, Rfl: 1    atorvastatin (LIPITOR) 40 MG tablet, *NEED OFFICE VISIT* TAKE 1 TABLET EVERY DAY, Disp: 90 tablet, Rfl: 3   cetirizine (ZYRTEC) 10 MG tablet, TAKE 1 TABLET BY MOUTH EVERY DAY AS NEEDED FOR ALLERGY, Disp: 90 tablet, Rfl: 3   DULoxetine (CYMBALTA) 60 MG capsule, Take 1 capsule (60 mg total) by mouth daily., Disp: 90 capsule, Rfl: 3   fluticasone (FLONASE) 50 MCG/ACT nasal spray, Place 2 sprays into both nostrils daily., Disp: 16 g, Rfl: 3   gabapentin (NEURONTIN) 300 MG capsule, Take 300 mg by mouth 2 (two) times daily., Disp: , Rfl:    metoprolol succinate (TOPROL-XL) 50 MG 24 hr tablet, Take 1 tablet (50 mg total) by mouth daily., Disp: 90 tablet, Rfl: 3   nitroGLYCERIN (NITROSTAT) 0.4 MG SL tablet, Place 1 tablet (0.4 mg total) under the tongue every 5 (five) minutes x 3 doses as needed for chest pain., Disp: 25 tablet, Rfl: 0   timolol (TIMOPTIC) 0.5 % ophthalmic solution, Place 1 drop into both eyes 2 (two) times daily. Instill 1 drop in each eye in the morning and one drop in each eye at night, Disp: 10 mL, Rfl: 0  Imaging Review  MR THORACIC SPINE WO CONTRAST  Narrative CLINICAL DATA:  Chronic pain syndrome, mid back pain going into the left leg.  EXAM: MRI THORACIC SPINE WITHOUT CONTRAST  TECHNIQUE: Multiplanar, multisequence MR imaging of the thoracic spine was performed. No intravenous contrast was administered.  COMPARISON:  None Available.  FINDINGS: Alignment:  Physiologic.  Vertebrae: No acute fracture, evidence of discitis, or aggressive bone lesion.  Cord:  Normal signal and morphology.  Paraspinal and other soft tissues: No acute paraspinal abnormality.  Disc levels:  Disc spaces: Degenerative disease with disc height loss C6-7. Degenerative disease with disc height loss at T6-7, T7-8 and T8-9. Mild disc height loss at T12-L1.  C6-7: Broad-based disc bulge with bilateral uncovertebral degenerative changes and bilateral foraminal stenosis  partially visualized.  C7-T1: Moderate-severe right foraminal stenosis. Mild left foraminal stenosis.  T1-T2: No disc protrusion, foraminal stenosis or central canal stenosis.  T2-T3: No disc protrusion, foraminal stenosis or central canal stenosis.  T3-T4: No disc protrusion, foraminal stenosis or central canal stenosis.  T4-T5: No disc protrusion, foraminal stenosis or central canal stenosis.  T5-T6: Small right paracentral disc protrusion. No foraminal or central canal stenosis.  T6-T7: Mild broad-based disc bulge. Mild bilateral foraminal stenosis. No spinal stenosis.  T7-T8: Mild broad-based disc bulge. Mild bilateral foraminal stenosis. No spinal stenosis.  T8-T9: Mild broad-based disc bulge. Mild bilateral foraminal stenosis. No spinal stenosis.  T9-T10: No disc protrusion, foraminal stenosis or central canal stenosis. Mild bilateral facet arthropathy.  T10-T11: Minimal broad-based disc bulge. Mild bilateral facet arthropathy. Mild left foraminal stenosis. No right foraminal stenosis. No spinal stenosis.  T11-T12: Mild broad-based disc bulge. No foraminal or central canal stenosis. Mild bilateral facet arthropathy.  T12-L1: Mild broad-based disc bulge. Moderate bilateral facet arthropathy. Mild left foraminal stenosis. No right foraminal stenosis.  IMPRESSION: 1. No acute osseous injury of the thoracic spine. 2. Mild thoracic spine spondylosis as described above.   Electronically Signed By: Kathreen Devoid M.D. On: 02/03/2022 07:55 MR LUMBAR SPINE WO CONTRAST  Narrative CLINICAL DATA:  Left-sided low back pain and left leg pain.  EXAM: MRI LUMBAR SPINE WITHOUT CONTRAST  TECHNIQUE: Multiplanar, multisequence MR imaging of the lumbar spine was performed. No  intravenous contrast was administered.  COMPARISON:  05/05/2020  FINDINGS: Segmentation:  Standard.  Alignment:  Physiologic.  Vertebrae: No acute fracture, evidence of discitis, or  aggressive bone lesion.  Conus medullaris and cauda equina: Conus extends to the L1 level. Conus and cauda equina appear normal.  Paraspinal and other soft tissues: No acute paraspinal abnormality.  Disc levels:  Disc spaces: Posterior lumbar interbody fusion at L4-5. Degenerative disease with disc height loss at T12-L1, L1-2, L2-3 and L5-S1.  T12-L1: Broad-based disc bulge with a small central disc protrusion. Mild bilateral facet arthropathy. No foraminal or central canal stenosis.  L1-L2: Broad-based disc bulge. Mild bilateral facet arthropathy. Mild spinal stenosis. Bilateral lateral recess narrowing. No significant foraminal stenosis.  L2-L3: Broad-based disc bulge. Mild bilateral facet arthropathy. Mild spinal stenosis. Bilateral lateral recess stenosis. No significant foraminal stenosis.  L3-L4: Broad-based disc bulge. Moderate bilateral facet arthropathy. Moderate spinal stenosis. Bilateral subarticular recess stenosis. Mild right foraminal stenosis. No left foraminal stenosis.  L4-L5: Interbody fusion.  No foraminal or central canal stenosis.  L5-S1: Broad-based disc bulge with a central disc protrusion. Moderate bilateral facet arthropathy. Severe left foraminal stenosis. Moderate right foraminal stenosis. No spinal stenosis.  IMPRESSION: 1. Diffuse lumbar spine spondylosis as described above. 2. No acute osseous injury of the lumbar spine.   Electronically Signed By: Kathreen Devoid M.D. On: 12/11/2021 11:20  Narrative CLINICAL DATA:  Lumbar fusion.  EXAM: DG C-ARM 61-120 MIN; LUMBAR SPINE - 2-3 VIEW  COMPARISON:  Radiographs 04/10/2018  FINDINGS: Bilateral pedicle screws at L4 and L5 are well position without complicating features. Interbody fusion device is well positioned at L4-5.  IMPRESSION: Fusion hardware at L4-5 in good position without complicating features.   Electronically Signed By: Marijo Sanes M.D. On: 04/25/2018 12:33 Complexity  Note: Imaging results reviewed.                         ROS  Cardiovascular: High blood pressure and Heart attack ( Date: 2001)bloof thinners, needs antibiotics prior to dental work Pulmonary or Respiratory: Smoking, Snoring , and Coughing up mucus (Bronchitis) Neurological: No reported neurological signs or symptoms such as seizures, abnormal skin sensations, urinary and/or fecal incontinence, being born with an abnormal open spine and/or a tethered spinal cord Psychological-Psychiatric: Anxiousness Gastrointestinal: Irregular, infrequent bowel movements (Constipation) Genitourinary: No reported renal or genitourinary signs or symptoms such as difficulty voiding or producing urine, peeing blood, non-functioning kidney, kidney stones, difficulty emptying the bladder, difficulty controlling the flow of urine, or chronic kidney disease Hematological: Brusing easily and Bleeding easily Endocrine: No reported endocrine signs or symptoms such as high or low blood sugar, rapid heart rate due to high thyroid levels, obesity or weight gain due to slow thyroid or thyroid disease Rheumatologic: No reported rheumatological signs and symptoms such as fatigue, joint pain, tenderness, swelling, redness, heat, stiffness, decreased range of motion, with or without associated rash Musculoskeletal: Negative for myasthenia gravis, muscular dystrophy, multiple sclerosis or malignant hyperthermia Work History: Retired  Allergies  Samantha [name] is allergic to hydrocodone and oxycodone.  Laboratory Chemistry Profile   Renal Lab Results  Component Value Date   BUN 16 08/23/2021   CREATININE 0.92 08/23/2021   GFR 59.16 (L) 08/23/2021   GFRAA >60 07/24/2019   GFRNONAA >60 07/24/2019     Electrolytes Lab Results  Component Value Date   NA 141 08/23/2021   K 4.4 08/23/2021   CL 104 08/23/2021   CALCIUM 9.0 08/23/2021  Hepatic Lab Results  Component Value Date   AST 12 08/23/2021   ALT 8 08/23/2021    ALBUMIN 3.8 08/23/2021   ALKPHOS 65 08/23/2021   LIPASE 32 09/19/2015     ID Lab Results  Component Value Date   SARSCOV2NAA NEGATIVE 01/25/2021   STAPHAUREUS NEGATIVE 04/18/2018   MRSAPCR NEGATIVE 04/18/2018     Bone Lab Results  Component Value Date   VD25OH 30.08 12/25/2021     Endocrine Lab Results  Component Value Date   GLUCOSE 99 08/23/2021   HGBA1C 6.3 12/25/2021   TSH 3.44 08/23/2021     Neuropathy Lab Results  Component Value Date   VITAMINB12 220 08/23/2021   FOLATE 9.0 08/23/2021   HGBA1C 6.3 12/25/2021     CNS No results found for: "COLORCSF", "APPEARCSF", "RBCCOUNTCSF", "WBCCSF", "POLYSCSF", "LYMPHSCSF", "EOSCSF", "PROTEINCSF", "GLUCCSF", "JCVIRUS", "CSFOLI", "IGGCSF", "LABACHR", "ACETBL"   Inflammation (CRP: Acute  ESR: Chronic) No results found for: "CRP", "ESRSEDRATE", "LATICACIDVEN"   Rheumatology No results found for: "RF", "ANA", "LABURIC", "URICUR", "LYMEIGGIGMAB", "LYMEABIGMQN", "HLAB27"   Coagulation Lab Results  Component Value Date   INR 1.04 04/25/2018   LABPROT 13.5 04/25/2018   APTT 32 01/23/2016   PLT 269.0 08/23/2021     Cardiovascular Lab Results  Component Value Date   HGB 12.9 08/23/2021   HCT 40.0 08/23/2021     Screening Lab Results  Component Value Date   SARSCOV2NAA NEGATIVE 01/25/2021   STAPHAUREUS NEGATIVE 04/18/2018   MRSAPCR NEGATIVE 04/18/2018     Cancer No results found for: "CEA", "CA125", "LABCA2"   Allergens No results found for: "ALMOND", "APPLE", "ASPARAGUS", "AVOCADO", "BANANA", "BARLEY", "BASIL", "BAYLEAF", "GREENBEAN", "LIMABEAN", "WHITEBEAN", "BEEFIGE", "REDBEET", "BLUEBERRY", "BROCCOLI", "CABBAGE", "MELON", "CARROT", "CASEIN", "CASHEWNUT", "CAULIFLOWER", "CELERY"     Note: Lab results reviewed.  [place]  Drug: Samantha [name]  reports no history of drug use. Alcohol:  reports current alcohol use. Tobacco:  reports that she has been smoking cigarettes. She has a 30.00 pack-year smoking history. She  has never used smokeless tobacco. Medical:  has a past medical history of A-fib ([place]), Arthritis, Chronic back pain, COPD (chronic obstructive pulmonary disease) ([place]), Coronary artery disease, COVID-19, Glaucoma, Hearing aid worn, High cholesterol, Hypertension, Lymphadenopathy (01/25/2016), Myocardial infarction ([place]), Non Hodgkin's lymphoma ([place]), Wears dentures, and Wears glasses. Family: family history includes Heart disease in her father and another family member; Hypertension in her mother; Stroke in her mother.  Past Surgical History:  Procedure Laterality Date   CARDIAC CATHETERIZATION     CATARACT EXTRACTION W/ INTRAOCULAR LENS IMPLANT     CHOLECYSTECTOMY     COLONOSCOPY W/ BIOPSIES AND POLYPECTOMY     CORONARY STENT PLACEMENT     INGUINAL LYMPH NODE BIOPSY Right 01/25/2016   Procedure: EXCISION DEEP RIGHT INGUINAL LYMPH NODE BIOPSY WITH ULTRA SOUND;  Surgeon: Fanny Skates, MD;  Location: [facility];  Service: General;  Laterality: Right;   SPINE SURGERY     Active Ambulatory Problems    Diagnosis Date Noted   Follicular lymphoma of lymph nodes of multiple regions ([place]) 01/30/2016   HTN (hypertension) 03/26/2018   Depression, major, single episode, complete remission ([place]) 03/26/2018   Routine physical examination 03/26/2018   Chronic low back pain 03/26/2018   Lumbar foraminal stenosis 04/25/2018   Intermittent atrial fibrillation ([place]) 05/30/2018   Hoarseness 07/25/2018   HLD (hyperlipidemia) 06/22/2020   SOB (shortness of breath) 08/22/2020   Atherosclerosis of aorta ([place]) 08/23/2020   Ground glass opacity present on imaging of lung 09/01/2020   Mucus  plugging of bronchi 09/02/2020   Pulmonary artery hypertension ([place]) 09/02/2020   Coronary artery disease involving native coronary artery of native heart without angina pectoris 09/02/2020   Abnormal CT scan of lung 09/02/2020   DDD (degenerative disc disease), lumbar 05/31/2015   B12 deficiency 10/19/2020   Tinea pedis  10/19/2020   COPD GOLD 2 / still smoking  12/13/2020   Cigarette smoker 12/13/2020   Prediabetes 12/25/2021   Lumbar post-laminectomy syndrome 03/08/2022   Radicular pain of left lower extremity 03/08/2022   Resolved Ambulatory Problems    Diagnosis Date Noted   Lymphadenopathy 01/25/2016   Past Medical History:  Diagnosis Date   A-fib [facility])    Arthritis    Chronic back pain    COPD (chronic obstructive pulmonary disease) ([place])    Coronary artery disease    COVID-19    Glaucoma    Hearing aid worn    High cholesterol    Hypertension    Myocardial infarction ([place])    Non Hodgkin's lymphoma ([place])    Wears dentures    Wears glasses    Constitutional Exam  General appearance: Well nourished, well developed, and well hydrated. In no apparent acute distress Vitals:   03/08/22 1052  BP: (!) 117/55  Pulse: (!) 122  Resp: 18  Temp: (!) 97.2 F (36.2 C)  TempSrc: Temporal  SpO2: 98%  Weight: 199 lb (90.3 kg)  Height: _0  (1.753 m)   BMI Assessment: Estimated body mass index is 29.39 kg/m as calculated from the following:   Height as of this encounter: _1  (1.753 m).   Weight as of this encounter: 199 lb (90.3 kg).  BMI interpretation table: BMI level Category Range association with higher incidence of chronic pain  <18 kg/m2 Underweight   18.5-24.9 kg/m2 Ideal body weight   25-29.9 kg/m2 Overweight Increased incidence by 20%  30-34.9 kg/m2 Obese (Class I) Increased incidence by 68%  35-39.9 kg/m2 Severe obesity (Class II) Increased incidence by 136%  >40 kg/m2 Extreme obesity (Class III) Increased incidence by 254%   Patient's current BMI Ideal Body weight  Body mass index is 29.39 kg/m. Ideal body weight: 66.2 kg (145 lb 15.1 oz) Adjusted ideal body weight: 75.8 kg (167 lb 2.7 oz)   BMI Readings from Last 4 Encounters:  03/08/22 29.39 kg/m  03/05/22 29.42 kg/m  01/16/22 29.53 kg/m  12/25/21 30.39 kg/m   Wt Readings from Last 4 Encounters:  03/08/22  199 lb (90.3 kg)  03/05/22 199 lb 3.2 oz (90.4 kg)  01/16/22 200 lb (90.7 kg)  12/25/21 205 lb 12.8 oz (93.4 kg)    Psych/Mental status: Alert, oriented x 3 (person, place, & time)       Eyes: PERLA Respiratory: No evidence of acute respiratory distress  Lumbar Spine Area Exam  Skin & Axial Inspection: Well healed scar from previous spine surgery detected Alignment: Symmetrical Functional ROM: Pain restricted ROM affecting primarily the left Stability: No instability detected Muscle Tone/Strength: Functionally intact. No obvious neuro-muscular anomalies detected. Sensory (Neurological): Dermatomal pain pattern left  Gait & Posture Assessment  Ambulation: Unassisted Gait: Relatively normal for age and body habitus Posture: WNL  Lower Extremity Exam    Side: Right lower extremity  Side: Left lower extremity  Stability: No instability observed          Stability: No instability observed          Skin & Extremity Inspection: Skin color, temperature, and hair growth are WNL. No peripheral edema or  cyanosis. No masses, redness, swelling, asymmetry, or associated skin lesions. No contractures.  Skin & Extremity Inspection: Skin color, temperature, and hair growth are WNL. No peripheral edema or cyanosis. No masses, redness, swelling, asymmetry, or associated skin lesions. No contractures.  Functional ROM: Unrestricted ROM                  Functional ROM: Pain restricted ROM for hip and knee joints          Muscle Tone/Strength: Functionally intact. No obvious neuro-muscular anomalies detected.  Muscle Tone/Strength: Functionally intact. No obvious neuro-muscular anomalies detected.  Sensory (Neurological): Unimpaired        Sensory (Neurological): Dermatomal pain pattern        DTR: Patellar: deferred today Achilles: deferred today Plantar: deferred today  DTR: Patellar: deferred today Achilles: deferred today Plantar: deferred today  Palpation: No palpable anomalies  Palpation: No  palpable anomalies    Assessment  Primary Diagnosis & Pertinent Problem List: The primary encounter diagnosis was Failed back surgical syndrome (L4-L5 fusion). Diagnoses of Lumbar post-laminectomy syndrome and Radicular pain of left lower extremity were also pertinent to this visit.  Visit Diagnosis (New problems to examiner): 1. Failed back surgical syndrome (L4-L5 fusion)   2. Lumbar post-laminectomy syndrome   3. Radicular pain of left lower extremity    Plan of Care (Initial workup plan)  Patient was referred to me by Geronimo Boot for consideration of spinal cord stimulator trial for failed back surgical syndrome in the context of her L4-L5 fusion.  Upon further chart review, she already has a spinal cord stim trial scheduled with Dr. Earlyne Iba at [facility] in [place].  I informed her that she does not need to see me for this and that she can continue her care with Dr. Felix Ahmadi.  She endorsed understanding.  Imaging Orders         DG PAIN CLINIC C-ARM 1-60 MIN NO REPORT      Provider-requested follow-up: No follow-ups on file.  Future Appointments  Date Time Provider [place]  04/27/2022  2:00 PM Burnard Hawthorne, FNP [facility]  07/06/2022  1:30 PM Arnett, Yvetta Coder, FNP [facility]    Duration of encounter: 43mnutes.  Total time on encounter, as per AMA guidelines included both the face-to-face and non-face-to-face time personally spent by the physician and/or other qualified health care professional(s) on the day of the encounter (includes time in activities that require the physician or other qualified health care professional and does not include time in activities normally performed by clinical staff). Physician's time may include the following activities when performed: Preparing to see the patient (e.g., pre-charting review of records, searching for previously ordered imaging, lab work, and nerve conduction tests) Review of prior analgesic  pharmacotherapies. Reviewing PMP Interpreting ordered tests (e.g., lab work, imaging, nerve conduction tests) Performing post-procedure evaluations, including interpretation of diagnostic procedures Obtaining and/or reviewing separately obtained history Performing a medically appropriate examination and/or evaluation Counseling and educating the patient/family/caregiver Ordering medications, tests, or procedures Referring and communicating with other health care professionals (when not separately reported) Documenting clinical information in the electronic or other health record Independently interpreting results (not separately reported) and communicating results to the patient/ family/caregiver Care coordination (not separately reported)  Note by: BGillis Santa MD Date: 03/08/2022; Time: 11:45 AM

## 2022-03-08 NOTE — Progress Notes (Signed)
Safety precautions to be maintained throughout the outpatient stay will include: orient to surroundings, keep bed in low position, maintain call bell within reach at all times, provide assistance with transfer out of bed and ambulation.  

## 2022-03-16 DIAGNOSIS — M48061 Spinal stenosis, lumbar region without neurogenic claudication: Secondary | ICD-10-CM | POA: Diagnosis not present

## 2022-03-16 DIAGNOSIS — M5416 Radiculopathy, lumbar region: Secondary | ICD-10-CM | POA: Diagnosis not present

## 2022-03-16 DIAGNOSIS — M961 Postlaminectomy syndrome, not elsewhere classified: Secondary | ICD-10-CM | POA: Diagnosis not present

## 2022-03-16 DIAGNOSIS — I10 Essential (primary) hypertension: Secondary | ICD-10-CM | POA: Diagnosis not present

## 2022-03-16 DIAGNOSIS — Z7901 Long term (current) use of anticoagulants: Secondary | ICD-10-CM | POA: Diagnosis not present

## 2022-03-16 DIAGNOSIS — Z79899 Other long term (current) drug therapy: Secondary | ICD-10-CM | POA: Diagnosis not present

## 2022-03-16 DIAGNOSIS — I2510 Atherosclerotic heart disease of native coronary artery without angina pectoris: Secondary | ICD-10-CM | POA: Diagnosis not present

## 2022-04-06 ENCOUNTER — Telehealth: Payer: Self-pay | Admitting: Family

## 2022-04-06 MED ORDER — AMLODIPINE BESYLATE 5 MG PO TABS: 2.5000 mg | ORAL_TABLET | Freq: Every day | ORAL | 3 refills | Status: DC

## 2022-04-06 NOTE — Telephone Encounter (Signed)
Prescription Request  04/06/2022  Is this a "Controlled Substance" medicine? No  LOV: 03/05/2022  What is the name of the medication or equipment? amLODipine (NORVASC) 5 MG tablet  Have you contacted your pharmacy to request a refill? Yes   Which pharmacy would you like this sent to?   [facility] [place] [address] Phone: [phone] Fax: [phone]    Patient notified that their request is being sent to the clinical staff for review and that they should receive a response within 2 business days.   Please advise at [facility] [phone]

## 2022-04-06 NOTE — Telephone Encounter (Signed)
RX sent in pt is aware

## 2022-04-20 DIAGNOSIS — I252 Old myocardial infarction: Secondary | ICD-10-CM | POA: Diagnosis not present

## 2022-04-20 DIAGNOSIS — I10 Essential (primary) hypertension: Secondary | ICD-10-CM | POA: Diagnosis not present

## 2022-04-20 DIAGNOSIS — F325 Major depressive disorder, single episode, in full remission: Secondary | ICD-10-CM | POA: Diagnosis not present

## 2022-04-20 DIAGNOSIS — E785 Hyperlipidemia, unspecified: Secondary | ICD-10-CM | POA: Diagnosis not present

## 2022-04-20 DIAGNOSIS — M5416 Radiculopathy, lumbar region: Secondary | ICD-10-CM | POA: Diagnosis not present

## 2022-04-20 DIAGNOSIS — I4891 Unspecified atrial fibrillation: Secondary | ICD-10-CM | POA: Diagnosis not present

## 2022-04-20 DIAGNOSIS — I2510 Atherosclerotic heart disease of native coronary artery without angina pectoris: Secondary | ICD-10-CM | POA: Diagnosis not present

## 2022-04-20 DIAGNOSIS — J449 Chronic obstructive pulmonary disease, unspecified: Secondary | ICD-10-CM | POA: Diagnosis not present

## 2022-04-20 DIAGNOSIS — M961 Postlaminectomy syndrome, not elsewhere classified: Secondary | ICD-10-CM | POA: Diagnosis not present

## 2022-04-20 HISTORY — PX: SPINAL CORD STIMULATOR IMPLANT: SHX2422

## 2022-04-23 ENCOUNTER — Other Ambulatory Visit: Payer: Self-pay | Admitting: Family

## 2022-04-23 ENCOUNTER — Other Ambulatory Visit: Payer: Self-pay

## 2022-04-23 MED ORDER — AMLODIPINE BESYLATE 5 MG PO TABS: 2.5000 mg | ORAL_TABLET | Freq: Every day | ORAL | 3 refills | Status: DC

## 2022-04-23 NOTE — Telephone Encounter (Signed)
Pt called stating [facility] stated the provider need to send another prescription in for amlodipine because It was some missing information

## 2022-04-24 NOTE — Telephone Encounter (Signed)
Spoke to pt and [facility] and was able to get rx straightened out.

## 2022-04-27 ENCOUNTER — Ambulatory Visit (INDEPENDENT_AMBULATORY_CARE_PROVIDER_SITE_OTHER): Payer: Medicare PPO | Admitting: Family

## 2022-04-27 ENCOUNTER — Encounter: Payer: Self-pay | Admitting: Family

## 2022-04-27 VITALS — BP 130/78 | HR 63 | Temp 97.8°F | Ht 69.0 in | Wt 205.2 lb

## 2022-04-27 DIAGNOSIS — G8929 Other chronic pain: Secondary | ICD-10-CM

## 2022-04-27 DIAGNOSIS — R7309 Other abnormal glucose: Secondary | ICD-10-CM | POA: Diagnosis not present

## 2022-04-27 DIAGNOSIS — M545 Low back pain, unspecified: Secondary | ICD-10-CM

## 2022-04-27 LAB — POCT GLYCOSYLATED HEMOGLOBIN (HGB A1C): Hemoglobin A1C: 5.9 % — AB (ref 4.0–5.6)

## 2022-04-27 MED ORDER — DULOXETINE HCL 30 MG PO CPEP: ORAL_CAPSULE | ORAL | 1 refills | Status: DC

## 2022-04-27 NOTE — Patient Instructions (Addendum)
You will be due to follow-up with Dr. Rockey Situ, [facility] this summer.  Please call to schedule, [phone]  If you would like to wean off of gabapentin, please decrease from 300 mg twice daily to 300 mg once daily.  From there you may take 300 mg every other day for 1 week and then stop medication completely.   As for Cymbalta you must decrease from 60 mg daily to 30 mg daily.  I have sent this into [facility] Cymbalta 30 mg for you to start.  You may take 30 mg for a week or so and then you may stop medication entirely.    A pleasure seeing you!

## 2022-04-27 NOTE — Assessment & Plan Note (Signed)
Significant improvement after spinal cord stimulator placed last week.  Provided instructions with how to wean safely off of the Cymbalta 60 mg daily and gabapentin 300 mg twice daily.

## 2022-04-27 NOTE — Progress Notes (Signed)
Assessment & Plan:  Elevated glucose -     POCT glycosylated hemoglobin (Hb A1C)  Chronic low back pain, unspecified back pain laterality, unspecified whether sciatica present Assessment & Plan: Significant improvement after spinal cord stimulator placed last week.  Provided instructions with how to wean safely off of the Cymbalta 60 mg daily and gabapentin 300 mg twice daily.  Orders: -     DULoxetine HCl; Take 67m daily after stopping cymbalta 680mfor one week. Then may stop medication.  Dispense: 30 capsule; Refill: 1     Return precautions given.   Risks, benefits, and alternatives of the medications and treatment plan prescribed today were discussed, and patient expressed understanding.   Education regarding symptom management and diagnosis given to patient on AVS either electronically or printed.  Return for Me[employer]. Penneypcoming or due, schedule.  MaMable [name]  Subjective:    Patient ID: Samantha [name]    DOB: [id number]   MRN: [id number]: BeAIJALON GREENHILLs a [age].o. female who presents today for follow up.   HPI: Feels well today.  No new complaints.  She is exceedingly pleased with recent spinal cord stimulator placed 1 week ago.  Dissolvable sutures in place and covered over low back.   She feels sore however back pain immediately was improved.  She has follow-up with Dr. SeFelix Ahmadiext week. She is interested in weaning off gabapentin and Cymbalta for low back pain, since resolved.    Depression-compliant with Cymbalta.  Rare use of Xanax.    status post spinal cord stimulator 04/20/2022 Dr SeFelix Ahmadillergies: Hydrocodone and Oxycodone Current Outpatient Medications on File Prior to Visit  Medication Sig Dispense Refill   ALPRAZolam (XANAX) 0.25 MG tablet Take 1 tablet (0.25 mg total) by mouth daily as needed for anxiety. 30 tablet 1   amiodarone (PACERONE) 200 MG tablet Take 0.5 tablets (100 mg total) by mouth every morning. 90  tablet 3   amLODipine (NORVASC) 5 MG tablet Take 0.5 tablets (2.5 mg total) by mouth daily. 45 tablet 3   apixaban (ELIQUIS) 5 MG TABS tablet TAKE 1 TABLET BY MOUTH TWICE A DAY 180 tablet 1   atorvastatin (LIPITOR) 40 MG tablet *NEED OFFICE VISIT* TAKE 1 TABLET EVERY DAY 90 tablet 3   cetirizine (ZYRTEC) 10 MG tablet TAKE 1 TABLET BY MOUTH EVERY DAY AS NEEDED FOR ALLERGY 90 tablet 3   fluticasone (FLONASE) 50 MCG/ACT nasal spray Place 2 sprays into both nostrils daily. 16 g 3   metoprolol succinate (TOPROL-XL) 50 MG 24 hr tablet Take 1 tablet (50 mg total) by mouth daily. 90 tablet 3   nitroGLYCERIN (NITROSTAT) 0.4 MG SL tablet Place 1 tablet (0.4 mg total) under the tongue every 5 (five) minutes x 3 doses as needed for chest pain. 25 tablet 0   timolol (TIMOPTIC) 0.5 % ophthalmic solution INSTILL 1 DROP IN EACH EYE IN THE MORNING AND [facility] 10 mL 3   No current facility-administered medications on file prior to visit.    Review of Systems  Constitutional:  Negative for chills and fever.  Respiratory:  Negative for cough.   Cardiovascular:  Negative for chest pain and palpitations.  Gastrointestinal:  Negative for nausea and vomiting.  Musculoskeletal:  Negative for back pain (resolved).      Objective:    BP 130/78   Pulse 63   Temp 97.8 F (36.6 C) (Oral)   Ht 5'  9" (1.753 m)   Wt 205 lb 3.2 oz (93.1 kg)   SpO2 98%   BMI 30.30 kg/m  BP Readings from Last 3 Encounters:  04/27/22 130/78  03/08/22 (!) 117/55  03/05/22 122/78   Wt Readings from Last 3 Encounters:  04/27/22 205 lb 3.2 oz (93.1 kg)  03/08/22 199 lb (90.3 kg)  03/05/22 199 lb 3.2 oz (90.4 kg)    Physical Exam Vitals reviewed.  Constitutional:      Appearance: She is well-developed.  Eyes:     Conjunctiva/sclera: Conjunctivae normal.  Cardiovascular:     Rate and Rhythm: Normal rate and regular rhythm.     Pulses: Normal pulses.     Heart sounds: Normal heart sounds.  Pulmonary:      Effort: Pulmonary effort is normal.     Breath sounds: Normal breath sounds. No wheezing, rhonchi or rales.  Skin:    General: Skin is warm and dry.  Neurological:     Mental Status: She is alert.  Psychiatric:        Speech: Speech normal.        Behavior: Behavior normal.        Thought Content: Thought content normal.

## 2022-05-01 DIAGNOSIS — M961 Postlaminectomy syndrome, not elsewhere classified: Secondary | ICD-10-CM | POA: Diagnosis not present

## 2022-05-15 DIAGNOSIS — M961 Postlaminectomy syndrome, not elsewhere classified: Secondary | ICD-10-CM | POA: Diagnosis not present

## 2022-05-24 ENCOUNTER — Telehealth: Payer: Self-pay | Admitting: Family

## 2022-05-24 DIAGNOSIS — I480 Paroxysmal atrial fibrillation: Secondary | ICD-10-CM

## 2022-05-24 NOTE — Telephone Encounter (Signed)
Prescription Request  05/24/2022  LOV: 04/27/2022  What is the name of the medication or equipment? ELIQUIS   Have you contacted your pharmacy to request a refill? Yes   Which pharmacy would you like this sent to?  [facility] [address]   Patient notified that their request is being sent to the clinical staff for review and that they should receive a response within 2 business days.   Please advise at Mobile [phone] (mobile)

## 2022-05-25 MED ORDER — APIXABAN 5 MG PO TABS: 5.0000 mg | ORAL_TABLET | Freq: Two times a day (BID) | ORAL | 1 refills | Status: DC

## 2022-05-25 NOTE — Telephone Encounter (Signed)
Call patient I refilled Eliquis to mail order pharmacy.  However she will be due to [facility]u [facility] July [facility] this year.  Please ask her to call [phone] to schedule her follow-up

## 2022-05-25 NOTE — Addendum Note (Signed)
Addended by: Burnard Hawthorne on: 05/25/2022 12:21 PM   Modules accepted: Orders

## 2022-05-25 NOTE — Telephone Encounter (Signed)
Spoke to pt and she was given number to call and schedule her appt. patient verbalized understanding

## 2022-06-05 DIAGNOSIS — L821 Other seborrheic keratosis: Secondary | ICD-10-CM | POA: Diagnosis not present

## 2022-06-05 DIAGNOSIS — Z08 Encounter for follow-up examination after completed treatment for malignant neoplasm: Secondary | ICD-10-CM | POA: Diagnosis not present

## 2022-06-05 DIAGNOSIS — H40153 Residual stage of open-angle glaucoma, bilateral: Secondary | ICD-10-CM | POA: Diagnosis not present

## 2022-06-05 DIAGNOSIS — D2262 Melanocytic nevi of left upper limb, including shoulder: Secondary | ICD-10-CM | POA: Diagnosis not present

## 2022-06-05 DIAGNOSIS — D2261 Melanocytic nevi of right upper limb, including shoulder: Secondary | ICD-10-CM | POA: Diagnosis not present

## 2022-06-05 DIAGNOSIS — Z85828 Personal history of other malignant neoplasm of skin: Secondary | ICD-10-CM | POA: Diagnosis not present

## 2022-06-30 ENCOUNTER — Other Ambulatory Visit: Payer: Self-pay | Admitting: Family

## 2022-07-02 MED ORDER — AMLODIPINE BESYLATE 5 MG PO TABS: 2.5000 mg | ORAL_TABLET | Freq: Every day | ORAL | 6 refills | Status: DC

## 2022-07-02 NOTE — Telephone Encounter (Signed)
Spoke to pt and she does not need eye drop medication she has enough she did need bp meds refill I did send those in to pharmacy

## 2022-07-04 ENCOUNTER — Other Ambulatory Visit: Payer: Self-pay | Admitting: Cardiovascular Disease

## 2022-07-04 ENCOUNTER — Telehealth: Payer: Self-pay | Admitting: Family

## 2022-07-04 MED ORDER — AMIODARONE HCL 200 MG PO TABS: 100.0000 mg | ORAL_TABLET | Freq: Every morning | ORAL | 0 refills | Status: DC

## 2022-07-04 NOTE — Telephone Encounter (Signed)
*  STAT* If patient is at the pharmacy, call can be transferred to refill team.   1. Which medications need to be refilled? (please list name of each medication and dose if known) amiodarone (PACERONE) 200 MG tablet  2. Which pharmacy/location (including street and city if local pharmacy) is medication to be [facility] to? [facility] [place] [place] - [address]   3. Do they need a 30 day or 90 day supply? 90 day   Pt states she only has 1 wk left.

## 2022-07-04 NOTE — Telephone Encounter (Signed)
Requested Prescriptions   Signed Prescriptions Disp Refills   amiodarone (PACERONE) 200 MG tablet 45 tablet 0    Sig: Take 0.5 tablets (100 mg total) by mouth every morning.    Authorizing Provider: Antonieta Iba    Ordering User: Kendrick Fries

## 2022-07-04 NOTE — Telephone Encounter (Deleted)
Error

## 2022-07-06 ENCOUNTER — Encounter: Payer: Self-pay | Admitting: Family

## 2022-07-06 ENCOUNTER — Ambulatory Visit (INDEPENDENT_AMBULATORY_CARE_PROVIDER_SITE_OTHER): Payer: Medicare PPO | Admitting: Family

## 2022-07-06 VITALS — BP 128/78 | HR 87 | Temp 97.7°F | Ht 69.0 in | Wt 196.0 lb

## 2022-07-06 DIAGNOSIS — E538 Deficiency of other specified B group vitamins: Secondary | ICD-10-CM

## 2022-07-06 DIAGNOSIS — M79672 Pain in left foot: Secondary | ICD-10-CM | POA: Diagnosis not present

## 2022-07-06 DIAGNOSIS — I10 Essential (primary) hypertension: Secondary | ICD-10-CM | POA: Diagnosis not present

## 2022-07-06 MED ORDER — AMLODIPINE BESYLATE 2.5 MG PO TABS: 2.5000 mg | ORAL_TABLET | Freq: Every day | ORAL | 3 refills | Status: DC

## 2022-07-06 MED ORDER — GABAPENTIN 100 MG PO CAPS: 100.0000 mg | ORAL_CAPSULE | Freq: Three times a day (TID) | ORAL | 3 refills | Status: DC

## 2022-07-06 NOTE — Patient Instructions (Signed)
Patient declines printing AVS.

## 2022-07-06 NOTE — Assessment & Plan Note (Signed)
Well controlled. Compliant with toprol  qd, amlodipine 2.5 mg qd.

## 2022-07-06 NOTE — Assessment & Plan Note (Signed)
Differentials include idiopathic neuropathy, plantar fasciitis, osteoarthritis.  Recent spinal cord stimulator. H/o CAD and known  Diffuse lumbar spine spondylosis. History of B12 deficiency.  Titrate gabapentin and counseled patient how to slowly titrate. Pending ABIs. She will let me know how she is doing.

## 2022-07-06 NOTE — Progress Notes (Signed)
Assessment & Plan:  Primary hypertension Assessment & Plan: Well controlled. Compliant with toprol  qd, amlodipine 2.5 mg qd.   Orders: -     amLODIPine Besylate; Take 1 tablet (2.5 mg total) by mouth daily.  Dispense: 90 tablet; Refill: 3 -     Comprehensive metabolic panel; Future  Left foot pain Assessment & Plan: Differentials include idiopathic neuropathy, plantar fasciitis, osteoarthritis.  Recent spinal cord stimulator. H/o CAD and known  Diffuse lumbar spine spondylosis. History of B12 deficiency.  Titrate gabapentin and counseled patient how to slowly titrate. Pending ABIs. She will let me know how she is doing.   Orders: -     Gabapentin; Take 1 capsule (100 mg total) by mouth 3 (three) times daily.  Dispense: 90 capsule; Refill: 3 -     VAS [place] LOWER EXTREMITY ARTERIAL DUPLEX; Future  B12 deficiency -     B12 and Folate Panel; Future     Return precautions given.   Risks, benefits, and alternatives of the medications and treatment plan prescribed today were discussed, and patient expressed understanding.   Education regarding symptom management and diagnosis given to patient on AVS either electronically or printed.  Return in about 6 months (around 01/05/2023) for Medicare Wellness upcoming or due, schedule.  Rennie Plowman, FNP  Subjective:    Patient ID: Samantha [name], female    DOB: 11-06-1940, 82 y.o.   MRN: [id number]  CC: Samantha [name] is a 82 y.o. female who presents today for follow up.   HPI: Overall feels well today.  She does complain of numbness in left dorsalfoot for years. Some improvement with spinal cord stimulator.  She endorses an when walking She is no longer on Cymbalta and gabapentin after spinal cord stimulator placed 2 months ago    H/o CAD  She continues to follow with Dr. Wilmon Pali, pain management  Allergies: Hydrocodone and Oxycodone Current Outpatient Medications on File Prior to Visit  Medication Sig Dispense Refill    ALPRAZolam (XANAX) 0.25 MG tablet Take 1 tablet (0.25 mg total) by mouth daily as needed for anxiety. 30 tablet 1   amiodarone (PACERONE) 200 MG tablet Take 0.5 tablets (100 mg total) by mouth every morning. 45 tablet 0   apixaban (ELIQUIS) 5 MG TABS tablet Take 1 tablet (5 mg total) by mouth 2 (two) times daily. 180 tablet 1   atorvastatin (LIPITOR) 40 MG tablet *NEED OFFICE VISIT* TAKE 1 TABLET EVERY DAY 90 tablet 3   cetirizine (ZYRTEC) 10 MG tablet TAKE 1 TABLET BY MOUTH EVERY DAY AS NEEDED FOR ALLERGY 90 tablet 3   fluticasone (FLONASE) 50 MCG/ACT nasal spray Place 2 sprays into both nostrils daily. 16 g 3   metoprolol succinate (TOPROL-XL) 50 MG 24 hr tablet Take 1 tablet (50 mg total) by mouth daily. 90 tablet 3   nitroGLYCERIN (NITROSTAT) 0.4 MG SL tablet Place 1 tablet (0.4 mg total) under the tongue every 5 (five) minutes x 3 doses as needed for chest pain. 25 tablet 0   timolol (TIMOPTIC) 0.5 % ophthalmic solution INSTILL 1 DROP IN EACH EYE IN THE MORNING AND 1 DROP IN [facility] EYE AT NIGHT 10 mL 3   No current facility-administered medications on file prior to visit.    Review of Systems  Constitutional:  Negative for chills and fever.  Respiratory:  Negative for cough.   Cardiovascular:  Negative for chest pain and palpitations.  Gastrointestinal:  Negative for nausea and vomiting.  Musculoskeletal:  Positive for  arthralgias.  Neurological:  Positive for numbness.      Objective:    BP 128/78   Pulse 87   Temp 97.7 F (36.5 C) (Oral)   Ht  (1.753 m)   Wt 196 lb (88.9 kg)   SpO2 97%   BMI 28.94 kg/m  BP Readings from Last 3 Encounters:  07/06/22 128/78  04/27/22 130/78  03/08/22 (!) 117/55   Wt Readings from Last 3 Encounters:  07/06/22 196 lb (88.9 kg)  04/27/22 205 lb 3.2 oz (93.1 kg)  03/08/22 199 lb (90.3 kg)    Physical Exam Vitals reviewed.  Constitutional:      Appearance: She is well-developed.  Eyes:     Conjunctiva/sclera: Conjunctivae normal.   Cardiovascular:     Rate and Rhythm: Normal rate and regular rhythm.     Pulses: Normal pulses.     Heart sounds: Normal heart sounds.     Comments: No palpable cords or masses. No asymmetry in calf size when compared bilaterally.   Pulmonary:     Effort: Pulmonary effort is normal.     Breath sounds: Normal breath sounds. No wheezing, rhonchi or rales.  Musculoskeletal:     Right lower leg: No edema.     Left lower leg: No edema.  Feet:     Comments: Bilateral palpable pedal pulses.  Left foot decreased sensation compared to right.  No rash, erythema, edema.  Varicosities noted bilaterally Skin:    General: Skin is warm and dry.  Neurological:     Mental Status: She is alert.  Psychiatric:        Speech: Speech normal.        Behavior: Behavior normal.        Thought Content: Thought content normal.

## 2022-07-11 ENCOUNTER — Telehealth (INDEPENDENT_AMBULATORY_CARE_PROVIDER_SITE_OTHER): Payer: Self-pay

## 2022-07-11 NOTE — Telephone Encounter (Signed)
LVM for pt TCB and schedule ABI - from active requests.   np. ABI - LAB ONLY. Screen for PAD, left foot pain, intermittent claudication. referred by arnett, margaret.

## 2022-07-13 DIAGNOSIS — F17200 Nicotine dependence, unspecified, uncomplicated: Secondary | ICD-10-CM | POA: Diagnosis not present

## 2022-07-13 DIAGNOSIS — J383 Other diseases of vocal cords: Secondary | ICD-10-CM | POA: Diagnosis not present

## 2022-07-13 DIAGNOSIS — R490 Dysphonia: Secondary | ICD-10-CM | POA: Diagnosis not present

## 2022-08-07 ENCOUNTER — Telehealth: Payer: Self-pay | Admitting: Family

## 2022-08-07 DIAGNOSIS — Z9689 Presence of other specified functional implants: Secondary | ICD-10-CM | POA: Diagnosis not present

## 2022-08-07 DIAGNOSIS — M4306 Spondylolysis, lumbar region: Secondary | ICD-10-CM | POA: Diagnosis not present

## 2022-08-07 DIAGNOSIS — M961 Postlaminectomy syndrome, not elsewhere classified: Secondary | ICD-10-CM | POA: Diagnosis not present

## 2022-08-07 DIAGNOSIS — M9901 Segmental and somatic dysfunction of cervical region: Secondary | ICD-10-CM | POA: Diagnosis not present

## 2022-08-07 DIAGNOSIS — M9903 Segmental and somatic dysfunction of lumbar region: Secondary | ICD-10-CM | POA: Diagnosis not present

## 2022-08-07 DIAGNOSIS — M5416 Radiculopathy, lumbar region: Secondary | ICD-10-CM | POA: Diagnosis not present

## 2022-08-07 DIAGNOSIS — M542 Cervicalgia: Secondary | ICD-10-CM | POA: Diagnosis not present

## 2022-08-07 NOTE — Telephone Encounter (Signed)
close

## 2022-08-10 ENCOUNTER — Other Ambulatory Visit: Payer: Self-pay | Admitting: Family

## 2022-08-10 DIAGNOSIS — F419 Anxiety disorder, unspecified: Secondary | ICD-10-CM

## 2022-08-10 NOTE — Telephone Encounter (Signed)
Refill for ALPRAZolam (XANAX) 0.25 MG tablet   LR- 02/28/22 ( 30 tabs/1 refill) LOV- 07/06/22 NV- 01/07/23

## 2022-08-14 DIAGNOSIS — M542 Cervicalgia: Secondary | ICD-10-CM | POA: Diagnosis not present

## 2022-08-14 DIAGNOSIS — M4306 Spondylolysis, lumbar region: Secondary | ICD-10-CM | POA: Diagnosis not present

## 2022-08-14 DIAGNOSIS — M9901 Segmental and somatic dysfunction of cervical region: Secondary | ICD-10-CM | POA: Diagnosis not present

## 2022-08-14 DIAGNOSIS — M9903 Segmental and somatic dysfunction of lumbar region: Secondary | ICD-10-CM | POA: Diagnosis not present

## 2022-08-17 NOTE — Progress Notes (Signed)
Spoke with pt and scheduled her for a lab appt on Monday.  

## 2022-08-20 ENCOUNTER — Other Ambulatory Visit (INDEPENDENT_AMBULATORY_CARE_PROVIDER_SITE_OTHER): Payer: Medicare PPO

## 2022-08-20 DIAGNOSIS — E538 Deficiency of other specified B group vitamins: Secondary | ICD-10-CM

## 2022-08-20 DIAGNOSIS — I10 Essential (primary) hypertension: Secondary | ICD-10-CM | POA: Diagnosis not present

## 2022-08-21 LAB — COMPREHENSIVE METABOLIC PANEL
ALT: 11 U/L (ref 0–35)
AST: 17 U/L (ref 0–37)
Albumin: 3.7 g/dL (ref 3.5–5.2)
Alkaline Phosphatase: 65 U/L (ref 39–117)
BUN: 16 mg/dL (ref 6–23)
CO2: 31 mEq/L (ref 19–32)
Calcium: 8.7 mg/dL (ref 8.4–10.5)
Chloride: 103 mEq/L (ref 96–112)
Creatinine, Ser: 0.81 mg/dL (ref 0.40–1.20)
GFR: 68.45 mL/min (ref >60.00)
Glucose, Bld: 73 mg/dL (ref 70–99)
Potassium: 4.5 mEq/L (ref 3.5–5.1)
Sodium: 142 mEq/L (ref 135–145)
Total Bilirubin: 0.4 mg/dL (ref 0.2–1.2)
Total Protein: 6.4 g/dL (ref 6.0–8.3)

## 2022-08-21 LAB — B12 AND FOLATE PANEL
Folate: 11.6 ng/mL (ref >5.9)
Vitamin B-12: >1500 pg/mL — ABNORMAL HIGH (ref 211–911)

## 2022-08-28 DIAGNOSIS — M542 Cervicalgia: Secondary | ICD-10-CM | POA: Diagnosis not present

## 2022-08-28 DIAGNOSIS — M9901 Segmental and somatic dysfunction of cervical region: Secondary | ICD-10-CM | POA: Diagnosis not present

## 2022-08-28 DIAGNOSIS — M9903 Segmental and somatic dysfunction of lumbar region: Secondary | ICD-10-CM | POA: Diagnosis not present

## 2022-08-28 DIAGNOSIS — M4306 Spondylolysis, lumbar region: Secondary | ICD-10-CM | POA: Diagnosis not present

## 2022-09-07 ENCOUNTER — Telehealth: Payer: Self-pay

## 2022-09-07 NOTE — Telephone Encounter (Signed)
-----   Message from Allegra Grana, FNP sent at 08/31/2022  9:30 AM EDT ----- Call patient Patient has not viewed MyChart result note.  Please review my chart note in detail with patient.   Please let me know if questions

## 2022-09-07 NOTE — Telephone Encounter (Signed)
LMTCB in regards to lab results.   Samantha [name],   Labs stable.  B12 elevated which I would anticipate if you are taking a multivitamin with B12. Please confirm that you are taking b12.   Regards, Claris Che

## 2022-09-19 ENCOUNTER — Other Ambulatory Visit: Payer: Self-pay | Admitting: Cardiovascular Disease

## 2022-09-25 ENCOUNTER — Ambulatory Visit (INDEPENDENT_AMBULATORY_CARE_PROVIDER_SITE_OTHER): Payer: Medicare PPO | Admitting: *Deleted

## 2022-09-25 VITALS — Ht 69.0 in | Wt 188.0 lb

## 2022-09-25 DIAGNOSIS — M9901 Segmental and somatic dysfunction of cervical region: Secondary | ICD-10-CM | POA: Diagnosis not present

## 2022-09-25 DIAGNOSIS — M9903 Segmental and somatic dysfunction of lumbar region: Secondary | ICD-10-CM | POA: Diagnosis not present

## 2022-09-25 DIAGNOSIS — Z0000 Encounter for general adult medical examination without abnormal findings: Secondary | ICD-10-CM | POA: Diagnosis not present

## 2022-09-25 DIAGNOSIS — M542 Cervicalgia: Secondary | ICD-10-CM | POA: Diagnosis not present

## 2022-09-25 DIAGNOSIS — M4306 Spondylolysis, lumbar region: Secondary | ICD-10-CM | POA: Diagnosis not present

## 2022-09-25 NOTE — Patient Instructions (Signed)
Samantha [name] , Thank you for taking time to come for your Medicare Wellness Visit. I appreciate your ongoing commitment to your health goals. Please review the following plan we discussed and let me know if I can assist you in the future.   These are the goals we discussed:  Goals      Healthy Lifestyle     Stay active. Healthy diet.     Patient Stated     None        This is a list of the screening recommended for you and due dates:  Health Maintenance  Topic Date Due   DTaP/Tdap/Td vaccine (1 - Tdap) Never done   Zoster (Shingles) Vaccine (2 of 2) 12/27/2018   COVID-19 Vaccine (4 - 2023-24 season) 11/17/2021   Flu Shot  10/18/2022   Medicare Annual Wellness Visit  09/25/2023   Pneumonia Vaccine  Completed   HPV Vaccine  Aged Out   Screening for Lung Cancer  Discontinued   DEXA scan (bone density measurement)  Discontinued   Hepatitis C Screening  Discontinued    Advanced directives: Yes on file  Conditions/risks identified: None  Next appointment: Follow up in one year for your annual wellness visit 09/30/23 @ 9:30   Preventive Care 65 Years and Older, Female Preventive care refers to lifestyle choices and visits with your health care provider that can promote health and wellness. What does preventive care include? A yearly physical exam. This is also called an annual well check. Dental exams once or twice a year. Routine eye exams. Ask your health care provider how often you should have your eyes checked. Personal lifestyle choices, including: Daily care of your teeth and gums. Regular physical activity. Eating a healthy diet. Avoiding tobacco and drug use. Limiting alcohol use. Practicing safe sex. Taking low-dose aspirin every day. Taking vitamin and mineral supplements as recommended by your health care provider. What happens during an annual well check? The services and screenings done by your health care provider during your annual well check will depend on  your age, overall health, lifestyle risk factors, and family history of disease. Counseling  Your health care provider may ask you questions about your: Alcohol use. Tobacco use. Drug use. Emotional well-being. Home and relationship well-being. Sexual activity. Eating habits. History of falls. Memory and ability to understand (cognition). Work and work Astronomer. Reproductive health. Screening  You may have the following tests or measurements: Height, weight, and BMI. Blood pressure. Lipid and cholesterol levels. These may be checked every 5 years, or more frequently if you are over 4 years old. Skin check. Lung cancer screening. You may have this screening every year starting at age 49 if you have a 30-pack-year history of smoking and currently smoke or have quit within the past 15 years. Fecal occult blood test (FOBT) of the stool. You may have this test every year starting at age 68. Flexible sigmoidoscopy or colonoscopy. You may have a sigmoidoscopy every 5 years or a colonoscopy every 10 years starting at age 41. Hepatitis C blood test. Hepatitis B blood test. Sexually transmitted disease (STD) testing. Diabetes screening. This is done by checking your blood sugar (glucose) after you have not eaten for a while (fasting). You may have this done every 1-3 years. Bone density scan. This is done to screen for osteoporosis. You may have this done starting at age 45. Mammogram. This may be done every 1-2 years. Talk to your health care provider about how often you should have  regular mammograms. Talk with your health care provider about your test results, treatment options, and if necessary, the need for more tests. Vaccines  Your health care provider may recommend certain vaccines, such as: Influenza vaccine. This is recommended every year. Tetanus, diphtheria, and acellular pertussis (Tdap, Td) vaccine. You may need a Td booster every 10 years. Zoster vaccine. You may need this  after age 11. Pneumococcal 13-valent conjugate (PCV13) vaccine. One dose is recommended after age 86. Pneumococcal polysaccharide (PPSV23) vaccine. One dose is recommended after age 65. Talk to your health care provider about which screenings and vaccines you need and how often you need them. This information is not intended to replace advice given to you by your health care provider. Make sure you discuss any questions you have with your health care provider. Document Released: 04/01/2015 Document Revised: 11/23/2015 Document Reviewed: 01/04/2015 Elsevier Interactive Patient Education  2017 [employer].  Fall Prevention in the Home Falls can cause injuries. They can happen to people of all ages. There are many things you can do to make your home safe and to help prevent falls. What can I do on the outside of my home? Regularly fix the edges of walkways and driveways and fix any cracks. Remove anything that might make you trip as you walk through a door, such as a raised step or threshold. Trim any bushes or trees on the path to your home. Use bright outdoor lighting. Clear any walking paths of anything that might make someone trip, such as rocks or tools. Regularly check to see if handrails are loose or broken. Make sure that both sides of any steps have handrails. Any raised decks and porches should have guardrails on the edges. Have any leaves, snow, or ice cleared regularly. Use sand or salt on walking paths during winter. Clean up any spills in your garage right away. This includes oil or grease spills. What can I do in the bathroom? Use night lights. Install grab bars by the toilet and in the tub and shower. Do not use towel bars as grab bars. Use non-skid mats or decals in the tub or shower. If you need to sit down in the shower, use a plastic, non-slip stool. Keep the floor dry. Clean up any water that spills on the floor as soon as it happens. Remove soap buildup in the tub or  shower regularly. Attach bath mats securely with double-sided non-slip rug tape. Do not have throw rugs and other things on the floor that can make you trip. What can I do in the bedroom? Use night lights. Make sure that you have a light by your bed that is easy to reach. Do not use any sheets or blankets that are too big for your bed. They should not hang down onto the floor. Have a firm chair that has side arms. You can use this for support while you get dressed. Do not have throw rugs and other things on the floor that can make you trip. What can I do in the kitchen? Clean up any spills right away. Avoid walking on wet floors. Keep items that you use a lot in easy-to-reach places. If you need to reach something above you, use a strong step stool that has a grab bar. Keep electrical cords out of the way. Do not use floor polish or wax that makes floors slippery. If you must use wax, use non-skid floor wax. Do not have throw rugs and other things on the floor that  can make you trip. What can I do with my stairs? Do not leave any items on the stairs. Make sure that there are handrails on both sides of the stairs and use them. Fix handrails that are broken or loose. Make sure that handrails are as long as the stairways. Check any carpeting to make sure that it is firmly attached to the stairs. Fix any carpet that is loose or worn. Avoid having throw rugs at the top or bottom of the stairs. If you do have throw rugs, attach them to the floor with carpet tape. Make sure that you have a light switch at the top of the stairs and the bottom of the stairs. If you do not have them, ask someone to add them for you. What else can I do to help prevent falls? Wear shoes that: Do not have high heels. Have rubber bottoms. Are comfortable and fit you well. Are closed at the toe. Do not wear sandals. If you use a stepladder: Make sure that it is fully opened. Do not climb a closed stepladder. Make  sure that both sides of the stepladder are locked into place. Ask someone to hold it for you, if possible. Clearly mark and make sure that you can see: Any grab bars or handrails. First and last steps. Where the edge of each step is. Use tools that help you move around (mobility aids) if they are needed. These include: Canes. Walkers. Scooters. Crutches. Turn on the lights when you go into a dark area. Replace any light bulbs as soon as they burn out. Set up your furniture so you have a clear path. Avoid moving your furniture around. If any of your floors are uneven, fix them. If there are any pets around you, be aware of where they are. Review your medicines with your doctor. Some medicines can make you feel dizzy. This can increase your chance of falling. Ask your doctor what other things that you can do to help prevent falls. This information is not intended to replace advice given to you by your health care provider. Make sure you discuss any questions you have with your health care provider. Document Released: 12/30/2008 Document Revised: 08/11/2015 Document Reviewed: 04/09/2014 Elsevier Interactive Patient Education  2017 [employer].

## 2022-09-25 NOTE — Progress Notes (Signed)
Subjective:   Samantha [name] is a 82 y.o. female who presents for Medicare Annual (Subsequent) preventive examination.  Visit Complete: Virtual  I connected with  Samantha [name] on 09/25/22 by a audio enabled telemedicine application and verified that I am speaking with the correct person using two identifiers.  Patient Location: Home  Provider Location: Office/Clinic  I discussed the limitations of evaluation and management by telemedicine. The patient expressed understanding and agreed to proceed.  Review of Systems     Cardiac Risk Factors include: advanced age (>57men, >28 women);sedentary lifestyle;hypertension;smoking/ tobacco exposure;dyslipidemia;Other (see comment), Risk factor comments: CAD     Objective:    Today's Vitals   09/25/22 1526  Weight: 188 lb (85.3 kg)  Height: 5\' 9"  (1.753 m)   Body mass index is 27.76 kg/m.     03/08/2022   10:55 AM 08/07/2021    3:38 PM 08/01/2020    3:07 PM 07/24/2019   10:46 AM 04/25/2018    8:23 AM 04/18/2018   10:43 AM 05/31/2016    9:09 AM  Advanced Directives  Does Patient Have a Medical Advance Directive? Yes Yes Yes Yes Yes Yes Yes  Type of Special educational needs teacher of [place];Living will Healthcare Power of [place];Living will Healthcare Power of [place];Living will Healthcare Power of [place];Living will Healthcare Power of [place];Living will Healthcare Power of Attorney  Does patient want to make changes to medical advance directive? No - Patient declined No - Patient declined No - Patient declined No - Patient declined     Copy of Healthcare Power of Attorney in Chart?  No - copy requested No - copy requested  No - copy requested No - copy requested No - copy requested  Would patient like information on creating a medical advance directive?    No - Patient declined       Current Medications (verified) Outpatient Encounter Medications as of 09/25/2022  Medication Sig   ALPRAZolam (XANAX) 0.25 MG tablet TAKE 1  TABLET EVERY DAY AS NEEDED FOR ANXIETY   amiodarone (PACERONE) 200 MG tablet TAKE 1/2 TABLET EVERY MORNING   amLODipine (NORVASC) 2.5 MG tablet Take 1 tablet (2.5 mg total) by mouth daily.   apixaban (ELIQUIS) 5 MG TABS tablet Take 1 tablet (5 mg total) by mouth 2 (two) times daily.   atorvastatin (LIPITOR) 40 MG tablet *NEED OFFICE VISIT* TAKE 1 TABLET EVERY DAY   cetirizine (ZYRTEC) 10 MG tablet TAKE 1 TABLET BY MOUTH EVERY DAY AS NEEDED FOR ALLERGY   fluticasone (FLONASE) 50 MCG/ACT nasal spray Place 2 sprays into both nostrils daily.   gabapentin (NEURONTIN) 100 MG capsule Take 1 capsule (100 mg total) by mouth 3 (three) times daily.   metoprolol succinate (TOPROL-XL) 50 MG 24 hr tablet Take 1 tablet (50 mg total) by mouth daily.   nitroGLYCERIN (NITROSTAT) 0.4 MG SL tablet Place 1 tablet (0.4 mg total) under the tongue every 5 (five) minutes x 3 doses as needed for chest pain.   timolol (TIMOPTIC) 0.5 % ophthalmic solution INSTILL 1 DROP IN [facility] EYE IN THE MORNING AND 1 DROP IN [facility] EYE AT NIGHT   No facility-administered encounter medications on file as of 09/25/2022.    Allergies (verified) Hydrocodone and Oxycodone   History: Past Medical History:  Diagnosis Date   A-fib (HCC)    Arthritis    Chronic back pain    COPD (chronic obstructive pulmonary disease) (HCC)    Coronary artery disease    COVID-19  08/22/20 likely contracted late 07/2020   Glaucoma    Hearing aid worn    High cholesterol    Hypertension    Lymphadenopathy 01/25/2016   Myocardial infarction (HCC)    2000   Non Hodgkin's lymphoma (HCC)    Wears dentures    " Top plate"   Wears glasses    Past Surgical History:  Procedure Laterality Date   CARDIAC CATHETERIZATION     CATARACT EXTRACTION W/ INTRAOCULAR LENS IMPLANT     CHOLECYSTECTOMY     COLONOSCOPY W/ BIOPSIES AND POLYPECTOMY     CORONARY STENT PLACEMENT     INGUINAL LYMPH NODE BIOPSY Right 01/25/2016   Procedure: EXCISION DEEP RIGHT INGUINAL  LYMPH NODE BIOPSY WITH ULTRA SOUND;  Surgeon: Claud Kelp, MD;  Location: [facility] OR;  Service: General;  Laterality: Right;   SPINE SURGERY     Family History  Problem Relation Age of Onset   Hypertension Mother    Stroke Mother    Heart disease Father    Heart disease Other    Social History   Socioeconomic History   Marital status: Single    Spouse name: Not on file   Number of children: Not on file   Years of education: Not on file   Highest education level: Not on file  Occupational History   Not on file  Tobacco Use   Smoking status: Every Day    Packs/day: 0.50    Years: 60.00    Additional pack years: 0.00    Total pack years: 30.00    Types: Cigarettes   Smokeless tobacco: Never  Vaping Use   Vaping Use: Never used  Substance and Sexual Activity   Alcohol use: Yes    Comment: social   Drug use: No   Sexual activity: Not Currently  Other Topics Concern   Not on file  Social History Narrative   Not on file   Social Determinants of Health   Financial Resource Strain: Low Risk  (09/25/2022)   Overall Financial Resource Strain (CARDIA)    Difficulty of Paying Living Expenses: Not hard at all  Food Insecurity: No Food Insecurity (09/25/2022)   Hunger Vital Sign    Worried About Running Out of Food in the Last Year: Never true    Ran Out of Food in the Last Year: Never true  Transportation Needs: No Transportation Needs (09/25/2022)   PRAPARE - Administrator, Civil Service (Medical): No    Lack of Transportation (Non-Medical): No  Physical Activity: Inactive (09/25/2022)   Exercise Vital Sign    Days of Exercise per Week: 0 days    Minutes of Exercise per Session: 0 min  Stress: Stress Concern Present (09/25/2022)   [employer] of Occupational Health - Occupational Stress Questionnaire    Feeling of Stress : Rather much  Social Connections: Socially Isolated (09/25/2022)   Social Connection and Isolation Panel [NHANES]    Frequency of Communication  with Friends and Family: More than three times a week    Frequency of Social Gatherings with Friends and Family: More than three times a week    Attends Religious Services: Never    Database administrator or Organizations: No    Attends Banker Meetings: Never    Marital Status: Widowed    Tobacco Counseling Ready to quit: Yes Counseling given: No   Clinical Intake:  Pre-visit preparation completed: Yes  Pain : No/denies pain     BMI - recorded:  27.76 Nutritional Status: BMI 25 -29 Overweight Nutritional Risks: None Diabetes: No  How often do you need to have someone help you when you read instructions, pamphlets, or other written materials from your doctor or pharmacy?: 1 - Never  Interpreter Needed?: No  Information entered by :: R. Amera Banos LPN   Activities of Daily Living    09/25/2022    3:28 PM 09/24/2022    7:57 PM  In your present state of health, do you have any difficulty performing the following activities:  Hearing? 1 0  Comment aids, but don't like them   Vision? 0 0  Comment glasses   Difficulty concentrating or making decisions? 1 0  Walking or climbing stairs? 1 0  Comment uses a cane   Dressing or bathing? 0 0  Doing errands, shopping? 0 0  Preparing Food and eating ? N N  Using the Toilet? N N  In the past six months, have you accidently leaked urine? Y N  Comment occassionally   Do you have problems with loss of bowel control? N N  Managing your Medications? N N  Managing your Finances? N N  Housekeeping or managing your Housekeeping? N N    Patient Care Team: Allegra Grana, FNP as PCP - General (Family Medicine) Antonieta Iba, MD as PCP - Cardiology (Cardiology)  Indicate any recent Medical Services you may have received from other than [facility] providers in the past year (date may be approximate).     Assessment:   This is a routine wellness examination for Samantha [name].  Hearing/Vision screen Hearing Screening - Comments::  aids Vision Screening - Comments:: glasses  Dietary issues and exercise activities discussed:     Goals Addressed             This Visit's Progress    Patient Stated       None       Depression Screen    09/25/2022    3:34 PM 07/06/2022    1:29 PM 04/27/2022    1:55 PM 03/05/2022    1:29 PM 12/25/2021    1:22 PM 11/03/2021    1:50 PM 08/23/2021    2:02 PM  PHQ 2/9 Scores  PHQ - 2 Score 1 2 2 2 4  0 1  PHQ- 9 Score 4 6 5 5   1     Fall Risk    09/25/2022    3:39 PM 09/24/2022    7:57 PM 07/06/2022    1:29 PM 04/27/2022    1:55 PM 03/08/2022   10:55 AM  Fall Risk   Falls in the past year? 0 0 0 0 1  Number falls in past yr: 0 0 0 0 1  Injury with Fall? 0 0 0  0  Risk for fall due to : No Fall Risks;Medication side effect  No Fall Risks No Fall Risks   Follow up Falls prevention discussed;Falls evaluation completed;Education provided  Falls evaluation completed Falls evaluation completed     MEDICARE RISK AT HOME:  Medicare Risk at Home - 09/25/22 1539     Any stairs in or around the home? Yes    If so, are there any without handrails? Yes    Home free of loose throw rugs in walkways, pet beds, electrical cords, etc? Yes    Adequate lighting in your home to reduce risk of falls? Yes    Life alert? No    Use of a cane, walker or w/c? Yes   cane  Grab bars in the bathroom? Yes    Shower chair or bench in shower? Yes    Elevated toilet seat or a handicapped toilet? Yes             Cognitive Function:        09/25/2022    3:41 PM 08/01/2020    3:39 PM  6CIT Screen  What Year? 0 points 0 points  What month? 0 points 0 points  What time? 0 points 0 points  Count back from 20 0 points 0 points  Months in reverse 4 points 0 points  Repeat phrase 4 points 0 points  Total Score 8 points 0 points    Immunizations Immunization History  Administered Date(s) Administered   Fluad Quad(high Dose 65+) 11/23/2020, 12/25/2021   Influenza, High Dose Seasonal PF  01/04/2017, 12/13/2017, 12/29/2018   Influenza-Unspecified 12/04/2013   Moderna Covid-19 Vaccine Bivalent Booster 56yrs & up 02/06/2021   Moderna SARS-COV2 Booster Vaccination 01/10/2020   PFIZER(Purple Top)SARS-COV-2 Vaccination 04/21/2019, 05/13/2019   Pneumococcal Conjugate-13 01/30/2016, 11/01/2018   Pneumococcal Polysaccharide-23 03/04/2017   Zoster Recombinant(Shingrix) 11/01/2018    TDAP status: Due, Education has been provided regarding the importance of this vaccine. Advised may receive this vaccine at local pharmacy or Health Dept. Aware to provide a copy of the vaccination record if obtained from local pharmacy or Health Dept. Verbalized acceptance and understanding.  Flu Vaccine status: Up to date  Pneumococcal vaccine status: Up to date  Covid-19 vaccine status: Completed vaccines  Qualifies for Shingles Vaccine? Yes   Zostavax completed No   Shingrix Completed?: No.    Education has been provided regarding the importance of this vaccine. Patient has been advised to call insurance company to determine out of pocket expense if they have not yet received this vaccine. Advised may also receive vaccine at local pharmacy or Health Dept. Verbalized acceptance and understanding.  Screening Tests Health Maintenance  Topic Date Due   DTaP/Tdap/Td (1 - Tdap) Never done   Zoster Vaccines- Shingrix (2 of 2) 12/27/2018   COVID-19 Vaccine (4 - 2023-24 season) 11/17/2021   Medicare Annual Wellness (AWV)  08/08/2022   INFLUENZA VACCINE  10/18/2022   Pneumonia Vaccine 62+ Years old  Completed   HPV VACCINES  Aged Out   Lung Cancer Screening  Discontinued   DEXA SCAN  Discontinued   Hepatitis C Screening  Discontinued    Health Maintenance  Health Maintenance Due  Topic Date Due   DTaP/Tdap/Td (1 - Tdap) Never done   Zoster Vaccines- Shingrix (2 of 2) 12/27/2018   COVID-19 Vaccine (4 - 2023-24 season) 11/17/2021   Medicare Annual Wellness (AWV)  08/08/2022   Colonoscopy years  ago.per patient. Patient stated that she does not plan on having any more.  Mammogram aged out  patient states does not plan on having any more  DEXA Scan patient stated that she had one years ago but does not plan on having another one because of her age  Lung Cancer Screening: (Low Dose CT Chest recommended if Age 18-80 years, 20 pack-year currently smoking OR have quit w/in 15years.) does qualify.   Lung Cancer Screening Referral: patient declines because of her age  Additional Screening:  Hepatitis C Screening: does not qualify, Completed 4/22  Vision Screening: Recommended annual ophthalmology exams for early detection of glaucoma and other disorders of the eye. Is the patient up to date with their annual eye exam?  Yes  Who is the provider or what is the name of  the office in which the patient attends annual eye exams? Dr. Alvester Morin every 4 months If pt is not established with a provider, would they like to be referred to a provider to establish care? No .   Dental Screening: Recommended annual dental exams for proper oral hygiene    Community Resource Referral / Chronic Care Management: CRR required this visit?  No   CCM required this visit?  No     Plan:     I have personally reviewed and noted the following in the patient's chart:   Medical and social history Use of alcohol, tobacco or illicit drugs  Current medications and supplements including opioid prescriptions. Patient is not currently taking opioid prescriptions. Functional ability and status Nutritional status Physical activity Advanced directives List of other physicians Hospitalizations, surgeries, and ER visits in previous 12 months Vitals Screenings to include cognitive, depression, and falls Referrals and appointments  In addition, I have reviewed and discussed with patient certain preventive protocols, quality metrics, and best practice recommendations. A written personalized care plan for preventive  services as well as general preventive health recommendations were provided to patient.     Sydell Axon, LPN   03/24/1094   After Visit Summary: (Declined) Due to this being a telephonic visit, with patients personalized plan was offered to patient but patient Declined AVS at this time   Nurse Notes: None

## 2022-10-11 DIAGNOSIS — H40153 Residual stage of open-angle glaucoma, bilateral: Secondary | ICD-10-CM | POA: Diagnosis not present

## 2022-10-21 ENCOUNTER — Other Ambulatory Visit: Payer: Self-pay | Admitting: Cardiovascular Disease

## 2022-10-22 ENCOUNTER — Other Ambulatory Visit: Payer: Self-pay | Admitting: Family

## 2022-10-22 DIAGNOSIS — I480 Paroxysmal atrial fibrillation: Secondary | ICD-10-CM

## 2022-10-23 NOTE — Telephone Encounter (Signed)
Spoke to pt and she has appt with Dr Mariah Milling and she stated that she will send to him and also tell him when she sees him

## 2022-10-28 NOTE — Progress Notes (Unsigned)
Cardiology Office Note  Date:  10/29/2022   ID:  Samantha [name], DOB 11-03-1940, MRN [id number]  PCP:  Allegra Grana, FNP   Chief Complaint  Patient presents with   Follow-up    Patient denies new or acute cardiac problems/concerns today.      HPI:  Samantha [name] is 82 yo woman with PMH of  Follicular lymphoma, chemo Paroxysmal atrial fibrillation Smokes 1/2 ppd CAD prior stent, 2001, mid RCA, left chest and arm pain Back surgery, lower Previously seen in [place] Presents for f/u of her coronary disease, stable angina, paroxysmal atrial fibrillation  LOV with myself July 2023 Seen by one of our providers December 2023  In follow-up today reports feeling well overall "Hormones gone crazy", waking in a sweat, day as well Hot and flush, diaphoresis episodes "First time in 40 years" Scheduled to see primary care tomorrow  Rare left chest arm pain does not last, not worried about it Discussed prior anginal symptoms 20 years ago, reported having severe left-sided chest pain radiating into her arm  Continues to smoke 1/2 pack/day, no desire to quit Previously reported chronic shortness of breath  Works with a cane, no falls Lives alone, does her ADLs Leg pain  Covid 07/2020  EKG personally reviewed by myself on todays visit EKG Interpretation Date/Time:  Monday October 29 2022 13:54:22 EDT Ventricular Rate:  58 PR Interval:  168 QRS Duration:  92 QT Interval:  464 QTC Calculation: 455 R Axis:   -38  Text Interpretation: Sinus bradycardia Left axis deviation When compared with ECG of 18-Apr-2018 10:58, consider inferior MI Confirmed by Julien Nordmann [phone]) on 10/29/2022 1:57:20 PM   Other past medical history reviewed Cardiac cath: 2001,  Had left arm pain, chest pain Stent to RCA Has had cath since then, details unclear ("couple times")  Echo 2017:normal EF  CT chest 2019 for lymphoma 1. Stable mild retroperitoneal adenopathy, compatible with  treated lymphoma. No new or progressive adenopathy. No new sites of disease. Normal size spleen. 2. Three-vessel coronary atherosclerosis. 3.  Aortic Atherosclerosis   Stress test 2015 Small relatively matched area of attenuation involving the  inferior wall of the left ventricle without associated regional wall  motion abnormality. No definite scintigraphic evidence of prior  infarction or pharmacologically induced ischemia.  2. Normal wall motion. Ejection fraction - 54%. (Previous ejection  fraction - 68%).  In 2020 Total chol 118, LDL 58 A1C 6.0   PMH:   has a past medical history of A-fib (HCC), Arthritis, Chronic back pain, COPD (chronic obstructive pulmonary disease) (HCC), Coronary artery disease, COVID-19, Glaucoma, Hearing aid worn, High cholesterol, Hypertension, Lymphadenopathy (01/25/2016), Myocardial infarction (HCC), Non Hodgkin's lymphoma (HCC), Wears dentures, and Wears glasses.  PSH:    Past Surgical History:  Procedure Laterality Date   CARDIAC CATHETERIZATION     CATARACT EXTRACTION W/ INTRAOCULAR LENS IMPLANT     CHOLECYSTECTOMY     COLONOSCOPY W/ BIOPSIES AND POLYPECTOMY     CORONARY STENT PLACEMENT     INGUINAL LYMPH NODE BIOPSY Right 01/25/2016   Procedure: EXCISION DEEP RIGHT INGUINAL LYMPH NODE BIOPSY WITH ULTRA SOUND;  Surgeon: Claud Kelp, MD;  Location: [facility] OR;  Service: General;  Laterality: Right;   SPINE SURGERY      Current Outpatient Medications  Medication Sig Dispense Refill   ALPRAZolam (XANAX) 0.25 MG tablet TAKE 1 TABLET EVERY DAY AS NEEDED FOR ANXIETY 30 tablet 2   amiodarone (PACERONE) 200 MG tablet TAKE 1/2 TABLET EVERY MORNING  45 tablet 0   amLODipine (NORVASC) 2.5 MG tablet Take 1 tablet (2.5 mg total) by mouth daily. 90 tablet 3   apixaban (ELIQUIS) 5 MG TABS tablet Take 1 tablet (5 mg total) by mouth 2 (two) times daily. 180 tablet 1   atorvastatin (LIPITOR) 40 MG tablet *NEED OFFICE VISIT* TAKE 1 TABLET EVERY DAY 90 tablet 3    fluticasone (FLONASE) 50 MCG/ACT nasal spray Place 2 sprays into both nostrils daily. 16 g 3   gabapentin (NEURONTIN) 100 MG capsule Take 1 capsule (100 mg total) by mouth 3 (three) times daily. 90 capsule 3   metoprolol succinate (TOPROL-XL) 50 MG 24 hr tablet Take 1 tablet (50 mg total) by mouth daily. 90 tablet 3   nitroGLYCERIN (NITROSTAT) 0.4 MG SL tablet Place 1 tablet (0.4 mg total) under the tongue every 5 (five) minutes x 3 doses as needed for chest pain. 25 tablet 0   OVER THE COUNTER MEDICATION Energy beet gummy     timolol (TIMOPTIC) 0.5 % ophthalmic solution INSTILL 1 DROP IN EACH EYE IN THE MORNING AND 1 DROP IN EACH EYE AT NIGHT 10 mL 3   cetirizine (ZYRTEC) 10 MG tablet TAKE 1 TABLET BY MOUTH EVERY DAY AS NEEDED FOR ALLERGY (Patient not taking: Reported on 10/29/2022) 90 tablet 3   No current facility-administered medications for this visit.     Allergies:   Hydrocodone and Oxycodone   Social History:  The patient  reports that she has been smoking cigarettes. She has a 30 pack-year smoking history. She has never used smokeless tobacco. She reports current alcohol use. She reports that she does not use drugs.   Family History:   family history includes Heart disease in her father and another family member; Hypertension in her mother; Stroke in her mother.    Review of Systems: Review of Systems  Constitutional: Negative.   HENT: Negative.    Respiratory: Negative.    Cardiovascular: Negative.   Gastrointestinal: Negative.   Musculoskeletal:  Positive for back pain and joint pain.  Neurological: Negative.   Psychiatric/Behavioral: Negative.    All other systems reviewed and are negative.   PHYSICAL EXAM: VS:  BP (!) 132/56 (BP Location: Left Arm, Patient Position: Sitting, Cuff Size: Normal)   Pulse (!) 58   Ht 5\' 9"  (1.753 m)   Wt 193 lb 3.2 oz (87.6 kg)   SpO2 96%   BMI 28.53 kg/m  , BMI Body mass index is 28.53 kg/m. Constitutional:  oriented to person,  place, and time. No distress.  HENT:  Head: Grossly normal Eyes:  no discharge. No scleral icterus.  Neck: No JVD, no carotid bruits  Cardiovascular: Regular rate and rhythm, no murmurs appreciated Pulmonary/Chest: Clear to auscultation bilaterally, no wheezes or rails Abdominal: Soft.  no distension.  no tenderness.  Musculoskeletal: Normal range of motion Neurological:  normal muscle tone. Coordination normal. No atrophy Skin: Skin warm and dry Psychiatric: normal affect, pleasant  Recent Labs: 08/20/2022: ALT 11; BUN 16; Creatinine, Ser 0.81; Potassium 4.5; Sodium 142    Lipid Panel Lab Results  Component Value Date   CHOL 109 08/23/2021   HDL 36.40 (L) 08/23/2021   LDLCALC 43 08/23/2021   TRIG 146.0 08/23/2021      Wt Readings from Last 3 Encounters:  10/29/22 193 lb 3.2 oz (87.6 kg)  09/25/22 188 lb (85.3 kg)  07/06/22 196 lb (88.9 kg)     ASSESSMENT AND PLAN:  Problem List Items Addressed This Visit  Cardiology Problems   HTN (hypertension)   Atherosclerosis of aorta (HCC)   HLD (hyperlipidemia)   Pulmonary artery hypertension (HCC)   Other Visit Diagnoses     Paroxysmal atrial fibrillation (HCC)    -  Primary   Relevant Orders   EKG 12-Lead (Completed)   Coronary artery disease of native artery of native heart with stable angina pectoris (HCC)       Smoker       Essential hypertension          CAD with stable angina Denies chest pain and arm pain concerning for angina Shortness of breath likely from deconditioning, long smoking history walking in the heat Gust anginal symptoms to watch for  Hyperlipidemia Previously at goal, prefers to have lab work done tomorrow with primary care Goal LDL less than 70 preferably less than 55 Was at goal June 2023  HTN Blood pressure is well controlled on today's visit. No changes made to the medications.  Atrial fib maintaining NSR, Continue low-dose amiodarone, metoprolol and  Eliquis  Sweating/Flushing Etiology unclear, she feels that his hormonal issues Scheduled to see primary care tomorrow Likely infection, less likely cardiac etiology   Total encounter time more than 30 minutes  Greater than 50% was spent in counseling and coordination of care with the patient    Signed, Dossie Arbour, M.D., Ph.D. [facility] [place], [place] [phone]

## 2022-10-29 ENCOUNTER — Encounter: Payer: Self-pay | Admitting: Cardiovascular Disease

## 2022-10-29 ENCOUNTER — Ambulatory Visit: Payer: Medicare PPO | Attending: Cardiovascular Disease | Admitting: Cardiovascular Disease

## 2022-10-29 ENCOUNTER — Telehealth: Payer: Self-pay | Admitting: Family

## 2022-10-29 VITALS — BP 132/56 | HR 58 | Ht 69.0 in | Wt 193.2 lb

## 2022-10-29 DIAGNOSIS — I25118 Atherosclerotic heart disease of native coronary artery with other forms of angina pectoris: Secondary | ICD-10-CM | POA: Diagnosis not present

## 2022-10-29 DIAGNOSIS — F17200 Nicotine dependence, unspecified, uncomplicated: Secondary | ICD-10-CM | POA: Diagnosis not present

## 2022-10-29 DIAGNOSIS — I480 Paroxysmal atrial fibrillation: Secondary | ICD-10-CM | POA: Diagnosis not present

## 2022-10-29 DIAGNOSIS — E782 Mixed hyperlipidemia: Secondary | ICD-10-CM

## 2022-10-29 DIAGNOSIS — I700 Atherosclerosis of aorta: Secondary | ICD-10-CM | POA: Diagnosis not present

## 2022-10-29 DIAGNOSIS — I10 Essential (primary) hypertension: Secondary | ICD-10-CM

## 2022-10-29 DIAGNOSIS — I2721 Secondary pulmonary arterial hypertension: Secondary | ICD-10-CM | POA: Diagnosis not present

## 2022-10-29 NOTE — Telephone Encounter (Signed)
Spoke to pt and scheduled her for in office appt on 10/30/22

## 2022-10-29 NOTE — Patient Instructions (Signed)

## 2022-10-29 NOTE — Telephone Encounter (Signed)
Pt called in asking to speak to [facility] nurse. Pt did not provide me with additional info. Pt would like a call back.

## 2022-10-30 ENCOUNTER — Ambulatory Visit
Admission: RE | Admit: 2022-10-30 | Discharge: 2022-10-30 | Disposition: A | Discharge status: Home / Self Care | Payer: Medicare PPO | Source: Ambulatory Visit | Attending: Family | Admitting: Family

## 2022-10-30 ENCOUNTER — Ambulatory Visit (INDEPENDENT_AMBULATORY_CARE_PROVIDER_SITE_OTHER): Payer: Medicare PPO | Admitting: Family

## 2022-10-30 ENCOUNTER — Telehealth: Payer: Self-pay | Admitting: Family

## 2022-10-30 ENCOUNTER — Other Ambulatory Visit: Payer: Self-pay | Admitting: Family

## 2022-10-30 ENCOUNTER — Encounter: Payer: Self-pay | Admitting: Family

## 2022-10-30 VITALS — BP 124/78 | HR 91 | Temp 97.9°F | Ht 69.0 in | Wt 192.8 lb

## 2022-10-30 DIAGNOSIS — Z13220 Encounter for screening for lipoid disorders: Secondary | ICD-10-CM

## 2022-10-30 DIAGNOSIS — Z8639 Personal history of other endocrine, nutritional and metabolic disease: Secondary | ICD-10-CM | POA: Diagnosis not present

## 2022-10-30 DIAGNOSIS — R61 Generalized hyperhidrosis: Secondary | ICD-10-CM

## 2022-10-30 DIAGNOSIS — C8290 Follicular lymphoma, unspecified, unspecified site: Secondary | ICD-10-CM | POA: Insufficient documentation

## 2022-10-30 DIAGNOSIS — R634 Abnormal weight loss: Secondary | ICD-10-CM | POA: Diagnosis not present

## 2022-10-30 DIAGNOSIS — Z136 Encounter for screening for cardiovascular disorders: Secondary | ICD-10-CM | POA: Diagnosis not present

## 2022-10-30 DIAGNOSIS — F325 Major depressive disorder, single episode, in full remission: Secondary | ICD-10-CM

## 2022-10-30 DIAGNOSIS — R590 Localized enlarged lymph nodes: Secondary | ICD-10-CM | POA: Diagnosis not present

## 2022-10-30 DIAGNOSIS — I700 Atherosclerosis of aorta: Secondary | ICD-10-CM | POA: Insufficient documentation

## 2022-10-30 LAB — POCT I-STAT CREATININE: Creatinine, Ser: 0.9 mg/dL (ref 0.44–1.00)

## 2022-10-30 MED ORDER — PAROXETINE HCL 10 MG PO TABS: 10.0000 mg | ORAL_TABLET | Freq: Every day | ORAL | 3 refills | Status: DC

## 2022-10-30 MED ORDER — IOHEXOL 300 MG/ML  SOLN
100.0000 mL | Freq: Once | INTRAMUSCULAR | Status: AC | PRN
  Administered 2022-10-30: 100 mL via INTRAVENOUS

## 2022-10-30 NOTE — Progress Notes (Signed)
Assessment & Plan:  Excessive sweating Assessment & Plan: Decreased appetite, excessive sweating.  History of follicular lymphoma, diagnosed 2018. Pending thorough lab investigation as well as malignancy workup including CT chest, abdomen and pelvis with contrast  Orders: -     VITAMIN D 25 Hydroxy (Vit-D Deficiency, Fractures) -     CBC with Differential/Platelet -     Comprehensive metabolic panel -     Lipid panel -     TSH -     Hemoglobin A1c -     CT CHEST W CONTRAST; Future -     PARoxetine HCl; Take 1 tablet (10 mg total) by mouth daily.  Dispense: 30 tablet; Refill: 3  Depression, major, single episode, complete remission (HCC) Assessment & Plan: Uncontrolled. Discussed passive thoughts of SI.  She adamantly denies a suicide plan. She admits to being worried about lymphoma recurrence.  Start Paxil 10mg . Consulted with awaiting Catie Clearance Coots, pharmD in regards to drug drug interaction with metoprolol succinate and paxil.  We agreed heart rate and blood pressure would likely will tolerate serum concentration of metoprolol. Close follow up.   Orders: -     PARoxetine HCl; Take 1 tablet (10 mg total) by mouth daily.  Dispense: 30 tablet; Refill: 3  Encounter for lipid screening for cardiovascular disease -     Lipid panel  History of vitamin D deficiency -     VITAMIN D 25 Hydroxy (Vit-D Deficiency, Fractures)     Return precautions given.   Risks, benefits, and alternatives of the medications and treatment plan prescribed today were discussed, and patient expressed understanding.   Education regarding symptom management and diagnosis given to patient on AVS either electronically or printed.  Return in about 6 weeks (around 12/11/2022).  Rennie Plowman, FNP  Subjective:    Patient ID: Samantha [name], female    DOB: 03-23-1940, 82 y.o.   MRN: [id number]  CC: Samantha [name] is a 82 y.o. female who presents today for follow up.   HPI: Complains of 'hot flashes' all  day long x 2 weeks ago   She endorses feeling more apathy, depression x 2 weeks She is feeling more anxious.  She is unsure why these feelings can quickly for her.  She is concerned perhaps her lymphoma has returned Relationships are stable and she has supportive friends that she can confide in. She has to force herself to eat which is unusual.  No back pain, cough, SOB, fever.   Endorses passive thought of not being here.  She denies suicide plan.  Denies homicidal ideation.    History of non-Hodgkin's lymphoma s/o chemotherapy , COPD, hypertension  She is a smoker   CT chest 05/20/2017 demonstrated left para aortic nodal soft tissue nodule 1.6 cm, favoring treated lymphoma.  No findings suspicious for active lymphoma in the chest abdomen or pelvis   Crt 0.81 ,  08/20/22  She is no longer doing mammogram, colonoscopy   Previously followed with oncology Dr Wyvonnia Lora, last seen approx 2022   Allergies: Hydrocodone and Oxycodone Current Outpatient Medications on File Prior to Visit  Medication Sig Dispense Refill   ALPRAZolam (XANAX) 0.25 MG tablet TAKE 1 TABLET EVERY DAY AS NEEDED FOR ANXIETY 30 tablet 2   amiodarone (PACERONE) 200 MG tablet TAKE 1/2 TABLET EVERY MORNING 45 tablet 0   amLODipine (NORVASC) 2.5 MG tablet Take 1 tablet (2.5 mg total) by mouth daily. 90 tablet 3   apixaban (ELIQUIS) 5 MG TABS tablet Take  1 tablet (5 mg total) by mouth 2 (two) times daily. 180 tablet 1   atorvastatin (LIPITOR) 40 MG tablet *NEED OFFICE VISIT* TAKE 1 TABLET EVERY DAY 90 tablet 3   cetirizine (ZYRTEC) 10 MG tablet TAKE 1 TABLET BY MOUTH EVERY DAY AS NEEDED FOR ALLERGY (Patient not taking: Reported on 10/29/2022) 90 tablet 3   fluticasone (FLONASE) 50 MCG/ACT nasal spray Place 2 sprays into both nostrils daily. 16 g 3   gabapentin (NEURONTIN) 100 MG capsule Take 1 capsule (100 mg total) by mouth 3 (three) times daily. 90 capsule 3   metoprolol succinate (TOPROL-XL) 50 MG 24 hr tablet Take 1  tablet (50 mg total) by mouth daily. 90 tablet 3   nitroGLYCERIN (NITROSTAT) 0.4 MG SL tablet Place 1 tablet (0.4 mg total) under the tongue every 5 (five) minutes x 3 doses as needed for chest pain. 25 tablet 0   OVER THE COUNTER MEDICATION Energy beet gummy     timolol (TIMOPTIC) 0.5 % ophthalmic solution INSTILL 1 DROP IN EACH EYE IN THE MORNING AND 1 DROP IN [facility] EYE AT NIGHT 10 mL 3   No current facility-administered medications on file prior to visit.    Review of Systems  Constitutional:  Positive for diaphoresis. Negative for chills and fever.  Respiratory:  Negative for cough.   Cardiovascular:  Negative for chest pain and palpitations.  Gastrointestinal:  Negative for nausea and vomiting.  Psychiatric/Behavioral:  Negative for suicidal ideas. The patient is nervous/anxious.       Objective:    BP 124/78   Pulse 91   Temp 97.9 F (36.6 C) (Oral)   Ht 5\' 9"  (1.753 m)   Wt 192 lb 12.8 oz (87.5 kg)   SpO2 96%   BMI 28.47 kg/m  BP Readings from Last 3 Encounters:  10/30/22 124/78  10/29/22 (!) 132/56  07/06/22 128/78   Wt Readings from Last 3 Encounters:  10/30/22 192 lb 12.8 oz (87.5 kg)  10/29/22 193 lb 3.2 oz (87.6 kg)  09/25/22 188 lb (85.3 kg)    Physical Exam Vitals reviewed.  Constitutional:      Appearance: She is well-developed.  Eyes:     Conjunctiva/sclera: Conjunctivae normal.  Neck:     Thyroid: No thyroid mass or thyromegaly.  Cardiovascular:     Rate and Rhythm: Normal rate and regular rhythm.     Pulses: Normal pulses.     Heart sounds: Normal heart sounds.  Pulmonary:     Effort: Pulmonary effort is normal.     Breath sounds: Normal breath sounds. No wheezing, rhonchi or rales.  Lymphadenopathy:     Head:     Right side of head: No submental, submandibular, tonsillar, preauricular, posterior auricular or occipital adenopathy.     Left side of head: No submental, submandibular, tonsillar, preauricular, posterior auricular or occipital  adenopathy.     Cervical: No cervical adenopathy.  Skin:    General: Skin is warm and dry.  Neurological:     Mental Status: She is alert.  Psychiatric:        Speech: Speech normal.        Behavior: Behavior normal.        Thought Content: Thought content normal.

## 2022-10-30 NOTE — Telephone Encounter (Signed)
I sent pt paxil to [facility]. however I think she preferred [facility]- would you call her to confirm? and send to new pharmacy if needed

## 2022-10-30 NOTE — Assessment & Plan Note (Signed)
Decreased appetite, excessive sweating.  History of follicular lymphoma, diagnosed 2018. Pending thorough lab investigation as well as malignancy workup including CT chest, abdomen and pelvis with contrast

## 2022-10-30 NOTE — Assessment & Plan Note (Addendum)
Uncontrolled. Discussed passive thoughts of SI.  She adamantly denies a suicide plan. She admits to being worried about lymphoma recurrence.  Start Paxil 10mg . Consulted with awaiting Catie Clearance Coots, pharmD in regards to drug drug interaction with metoprolol succinate and paxil.  We agreed heart rate and blood pressure would likely will tolerate serum concentration of metoprolol. Close follow up.

## 2022-10-30 NOTE — Patient Instructions (Signed)
I am currently consulting with pharmacy in regards to starting Paxil.  As discussed has a drug interaction metoprolol succinate  and I wanted to be sure that a low-dose of Paxil would be appropriate.  I will let you know very soon.   I have ordered a stat CT of your chest and abdomen as well as labs today.  Please keep your cell phone on you as I  will you with any critical results.  Otherwise, I will results result via mychart.

## 2022-10-30 NOTE — Telephone Encounter (Signed)
LVM to call back to see if she wanted medications to go to [facility] or [facility]

## 2022-10-31 ENCOUNTER — Telehealth: Payer: Self-pay | Admitting: Oncology

## 2022-10-31 ENCOUNTER — Encounter: Payer: Self-pay | Admitting: Family

## 2022-10-31 ENCOUNTER — Telehealth: Payer: Self-pay | Admitting: Family

## 2022-10-31 ENCOUNTER — Other Ambulatory Visit: Payer: Self-pay | Admitting: Family

## 2022-10-31 DIAGNOSIS — R61 Generalized hyperhidrosis: Secondary | ICD-10-CM

## 2022-10-31 NOTE — Telephone Encounter (Signed)
Noted  

## 2022-10-31 NOTE — Telephone Encounter (Signed)
Called patient to discuss CT chest and abdomen.  Concern for progression, recurrence of lymphoma.  Patient agreeable to oncology referral which has been placed today.

## 2022-10-31 NOTE — Telephone Encounter (Signed)
Spoke with patient to schedule new consult per referral received from M. Arnette. Pt was asked if she would like to return to [facility] for care since she seen Dr. Candise Che prior. Pt declined at that moment and was scheduled with Dr. Orlie Dakin. Received call 15 minutes later from patient requesting to cancel appointment and transfer referral back to [facility] to Dr. Candise Che.

## 2022-10-31 NOTE — Telephone Encounter (Signed)
Pt called back and she want her medication to go to [facility]

## 2022-11-01 NOTE — Telephone Encounter (Signed)
noted 

## 2022-11-01 NOTE — Telephone Encounter (Signed)
Pt called in asking to speak with [facility] CMA. Pt did not provided me additional details.

## 2022-11-01 NOTE — Telephone Encounter (Signed)
Please let patient know that we have sent this referral to Dr. Candise Che

## 2022-11-01 NOTE — Telephone Encounter (Signed)
Dr Rance Muir,  [facility] you are doing well.  Samantha [name] last saw you 12/2018.   I am concerned for recurrence of lymphoma based on CT chest and abdomen 8/`3/24 in the setting of night sweats.  Patient initially thought she would want to be seen locally in [place] however she would like to be seen by you again.  Is your office able to reach out to her?    Samantha [name]  Pt has canceled appointment with oncology in [place].  She would like to be seen by Dr Wyvonnia Lora whom she previously saw for lymphoma.  Please send referral to him

## 2022-11-01 NOTE — Telephone Encounter (Signed)
Pt is aware.  

## 2022-11-01 NOTE — Telephone Encounter (Signed)
Spoke to pt and she stated that she would prefer to go to Dr Sheffield Slider Oncologist  in [place] instead of going somewhere here in [place]

## 2022-11-06 ENCOUNTER — Inpatient Hospital Stay: Payer: Medicare PPO | Admitting: Oncology

## 2022-11-06 ENCOUNTER — Other Ambulatory Visit: Payer: Medicare PPO

## 2022-11-06 DIAGNOSIS — M961 Postlaminectomy syndrome, not elsewhere classified: Secondary | ICD-10-CM | POA: Diagnosis not present

## 2022-11-09 DIAGNOSIS — L298 Other pruritus: Secondary | ICD-10-CM | POA: Diagnosis not present

## 2022-11-09 DIAGNOSIS — L820 Inflamed seborrheic keratosis: Secondary | ICD-10-CM | POA: Diagnosis not present

## 2022-11-09 DIAGNOSIS — L538 Other specified erythematous conditions: Secondary | ICD-10-CM | POA: Diagnosis not present

## 2022-11-27 ENCOUNTER — Inpatient Hospital Stay: Payer: Medicare PPO | Attending: Hematology | Admitting: Hematology

## 2022-11-27 ENCOUNTER — Inpatient Hospital Stay: Payer: Medicare PPO

## 2022-11-27 VITALS — BP 153/56 | HR 66 | Temp 97.5°F | Resp 16 | Ht 69.0 in | Wt 201.5 lb

## 2022-11-27 DIAGNOSIS — R61 Generalized hyperhidrosis: Secondary | ICD-10-CM | POA: Insufficient documentation

## 2022-11-27 DIAGNOSIS — F17210 Nicotine dependence, cigarettes, uncomplicated: Secondary | ICD-10-CM | POA: Insufficient documentation

## 2022-11-27 DIAGNOSIS — C8298 Follicular lymphoma, unspecified, lymph nodes of multiple sites: Secondary | ICD-10-CM | POA: Diagnosis not present

## 2022-11-27 LAB — CMP (CANCER CENTER ONLY)
ALT: 6 U/L (ref 0–44)
AST: 14 U/L — ABNORMAL LOW (ref 15–41)
Albumin: 3.7 g/dL (ref 3.5–5.0)
Alkaline Phosphatase: 54 U/L (ref 38–126)
Anion gap: 6 (ref 5–15)
BUN: 9 mg/dL (ref 8–23)
CO2: 32 mmol/L (ref 22–32)
Calcium: 8.8 mg/dL — ABNORMAL LOW (ref 8.9–10.3)
Chloride: 105 mmol/L (ref 98–111)
Creatinine: 0.92 mg/dL (ref 0.44–1.00)
GFR, Estimated: >60 mL/min (ref >60)
Glucose, Bld: 149 mg/dL — ABNORMAL HIGH (ref 70–99)
Potassium: 3.3 mmol/L — ABNORMAL LOW (ref 3.5–5.1)
Sodium: 143 mmol/L (ref 135–145)
Total Bilirubin: 0.5 mg/dL (ref 0.3–1.2)
Total Protein: 6.7 g/dL (ref 6.5–8.1)

## 2022-11-27 LAB — CBC WITH DIFFERENTIAL (CANCER CENTER ONLY)
Abs Immature Granulocytes: 0.02 10*3/uL (ref 0.00–0.07)
Basophils Absolute: 0.1 10*3/uL (ref 0.0–0.1)
Basophils Relative: 1 %
Eosinophils Absolute: 0.1 10*3/uL (ref 0.0–0.5)
Eosinophils Relative: 2 %
HCT: 32.8 % — ABNORMAL LOW (ref 36.0–46.0)
Hemoglobin: 10.1 g/dL — ABNORMAL LOW (ref 12.0–15.0)
Immature Granulocytes: 0 %
Lymphocytes Relative: 25 %
Lymphs Abs: 1.9 10*3/uL (ref 0.7–4.0)
MCH: 26.4 pg (ref 26.0–34.0)
MCHC: 30.8 g/dL (ref 30.0–36.0)
MCV: 85.6 fL (ref 80.0–100.0)
Monocytes Absolute: 0.6 10*3/uL (ref 0.1–1.0)
Monocytes Relative: 7 %
Neutro Abs: 5.1 10*3/uL (ref 1.7–7.7)
Neutrophils Relative %: 65 %
Platelet Count: 283 10*3/uL (ref 150–400)
RBC: 3.83 MIL/uL — ABNORMAL LOW (ref 3.87–5.11)
RDW: 14.1 % (ref 11.5–15.5)
WBC Count: 7.8 10*3/uL (ref 4.0–10.5)
nRBC: 0 % (ref 0.0–0.2)

## 2022-11-27 LAB — TSH: TSH: 2.652 u[IU]/mL (ref 0.350–4.500)

## 2022-11-27 LAB — T4, FREE: Free T4: 1.03 ng/dL (ref 0.61–1.12)

## 2022-11-27 LAB — LACTATE DEHYDROGENASE: LDH: 184 U/L (ref 98–192)

## 2022-11-27 NOTE — Progress Notes (Signed)
HEMATOLOGY/ONCOLOGY CONSULTATION NOTE  Date of Service: 11/27/2022  Patient Care Team: Allegra Grana, FNP as PCP - General (Family Medicine) Mariah Milling Tollie Pizza, MD as PCP - Cardiology (Cardiology)  CHIEF COMPLAINTS/PURPOSE OF CONSULTATION:  Recurrent lymphoma based CT chest and Abdomen   HISTORY OF PRESENTING ILLNESS:   Samantha [name] is a wonderful 82 y.o. female who has been referred to [place] by Dr. Rennie Plowman for evaluation and management of recurrence of lymphoma. Patient was having excessive night sweats and saw her PCP. She had an CT chest and abdomen on 10/20/2022 which shows concerns for recurrence.   Patient previously used to follow-up with me for follicular lymphoma of lymph nodes of multiple regions and was last seen by me on 07/24/2019. Patient was suppose to follow-up with [place], but did not follow-up. Patient was treated with 5 cycles of Bendamustine and Rituxan, with her last treatment in June 2018. PET scan and CT scans after 2018 did not show any disease progression.   Patient is accompanied by her son during this visit. She notes she has been doing well overall for the past 6 months. She notes she has been having drenching night sweats for around 1-2 months. She denies any recent infections or any respiratory symptoms. Patient notes she is not been having night sweats for the past 2 weeks.   Patient notes she has been taking Gabapentin for a long time and has been tolerating it well without any new or severe toxicities.   She denies any recent steroid usage or any other medicine changes in the past 6 months. She is complaint with all of her medicine. She denies taking any allergy medication.   She denies any new infection issues, fever, chills, unexpected weight loss, chest pain, abdominal pain, back pain, or leg swelling. She does complain of mild SOB and mild occasional fatigue. Patient complains of more frequent bathroom usage, but denies diarrhea. She denies black  stool or blood in stool.   During the physical examination, patient complained of mild lower left abdominal discomfort. Denies pain.   Patient notes she used to have hot flashes and went to her PCP. She was prescribed Paxil 10 mg which has improved her hot flashes and nightsweats. She denies hot flashes during this visit.    Patient notes she smokes little less than half pack a day.   She reports of mild change in appetite, but denies any weight loss.   MEDICAL HISTORY:  Past Medical History:  Diagnosis Date   A-fib [facility])    Arthritis    Chronic back pain    COPD (chronic obstructive pulmonary disease) (HCC)    Coronary artery disease    COVID-19    08/22/20 likely contracted late 07/2020   Glaucoma    Hearing aid worn    High cholesterol    Hypertension    Lymphadenopathy 01/25/2016   Myocardial infarction (HCC)    2000   Non Hodgkin's lymphoma (HCC)    Wears dentures    " Top plate"   Wears glasses     SURGICAL HISTORY: Past Surgical History:  Procedure Laterality Date   CARDIAC CATHETERIZATION     CATARACT EXTRACTION W/ INTRAOCULAR LENS IMPLANT     CHOLECYSTECTOMY     COLONOSCOPY W/ BIOPSIES AND POLYPECTOMY     CORONARY STENT PLACEMENT     INGUINAL LYMPH NODE BIOPSY Right 01/25/2016   Procedure: EXCISION DEEP RIGHT INGUINAL LYMPH NODE BIOPSY WITH ULTRA SOUND;  Surgeon: Claud Kelp,  MD;  Location: [facility] OR;  Service: General;  Laterality: Right;   SPINE SURGERY      SOCIAL HISTORY: Social History   Socioeconomic History   Marital status: Single    Spouse name: Not on file   Number of children: Not on file   Years of education: Not on file   Highest education level: Not on file  Occupational History   Not on file  Tobacco Use   Smoking status: Every Day    Current packs/day: 0.50    Average packs/day: 0.5 packs/day for 60.0 years (30.0 ttl pk-yrs)    Types: Cigarettes   Smokeless tobacco: Never  Vaping Use   Vaping status: Never Used  Substance and  Sexual Activity   Alcohol use: Yes    Comment: social   Drug use: No   Sexual activity: Not Currently  Other Topics Concern   Not on file  Social History Narrative   Not on file   Social Determinants of Health   Financial Resource Strain: Low Risk  (09/25/2022)   Overall Financial Resource Strain (CARDIA)    Difficulty of Paying Living Expenses: Not hard at all  Food Insecurity: No Food Insecurity (09/25/2022)   Hunger Vital Sign    Worried About Running Out of Food in the Last Year: Never true    Ran Out of Food in the Last Year: Never true  Transportation Needs: No Transportation Needs (09/25/2022)   PRAPARE - Administrator, Civil Service (Medical): No    Lack of Transportation (Non-Medical): No  Physical Activity: Inactive (09/25/2022)   Exercise Vital Sign    Days of Exercise per Week: 0 days    Minutes of Exercise per Session: 0 min  Stress: Stress Concern Present (09/25/2022)   [employer] of Occupational Health - Occupational Stress Questionnaire    Feeling of Stress : Rather much  Social Connections: Socially Isolated (09/25/2022)   Social Connection and Isolation Panel [NHANES]    Frequency of Communication with Friends and Family: More than three times a week    Frequency of Social Gatherings with Friends and Family: More than three times a week    Attends Religious Services: Never    Database administrator or Organizations: No    Attends Banker Meetings: Never    Marital Status: Widowed  Intimate Partner Violence: Not At Risk (09/25/2022)   Humiliation, Afraid, Rape, and Kick questionnaire    Fear of Current or Ex-Partner: No    Emotionally Abused: No    Physically Abused: No    Sexually Abused: No    FAMILY HISTORY: Family History  Problem Relation Age of Onset   Hypertension Mother    Stroke Mother    Heart disease Father    Heart disease Other     ALLERGIES:  is allergic to hydrocodone and oxycodone.  MEDICATIONS:  Current  Outpatient Medications  Medication Sig Dispense Refill   ALPRAZolam (XANAX) 0.25 MG tablet TAKE 1 TABLET EVERY DAY AS NEEDED FOR ANXIETY 30 tablet 2   amiodarone (PACERONE) 200 MG tablet TAKE 1/2 TABLET EVERY MORNING 45 tablet 0   amLODipine (NORVASC) 2.5 MG tablet Take 1 tablet (2.5 mg total) by mouth daily. 90 tablet 3   apixaban (ELIQUIS) 5 MG TABS tablet Take 1 tablet (5 mg total) by mouth 2 (two) times daily. 180 tablet 1   atorvastatin (LIPITOR) 40 MG tablet *NEED OFFICE VISIT* TAKE 1 TABLET EVERY DAY 90 tablet 3   cetirizine (  ZYRTEC) 10 MG tablet TAKE 1 TABLET BY MOUTH EVERY DAY AS NEEDED FOR ALLERGY (Patient not taking: Reported on 10/29/2022) 90 tablet 3   fluticasone (FLONASE) 50 MCG/ACT nasal spray Place 2 sprays into both nostrils daily. 16 g 3   gabapentin (NEURONTIN) 100 MG capsule Take 1 capsule (100 mg total) by mouth 3 (three) times daily. 90 capsule 3   metoprolol succinate (TOPROL-XL) 50 MG 24 hr tablet Take 1 tablet (50 mg total) by mouth daily. 90 tablet 3   nitroGLYCERIN (NITROSTAT) 0.4 MG SL tablet Place 1 tablet (0.4 mg total) under the tongue every 5 (five) minutes x 3 doses as needed for chest pain. 25 tablet 0   OVER THE COUNTER MEDICATION Energy beet gummy     PARoxetine (PAXIL) 10 MG tablet Take 1 tablet (10 mg total) by mouth daily. 30 tablet 3   timolol (TIMOPTIC) 0.5 % ophthalmic solution INSTILL 1 DROP IN EACH EYE IN THE MORNING AND 1 DROP IN [facility] EYE AT NIGHT 10 mL 3   No current facility-administered medications for this visit.    REVIEW OF SYSTEMS:    10 Point review of Systems was done is negative except as noted above.  PHYSICAL EXAMINATION: ECOG PERFORMANCE STATUS: 2 - Symptomatic, <50% confined to bed  . Vitals:   11/27/22 1057  BP: (!) 153/56  Pulse: 66  Resp: 16  Temp: (!) 97.5 F (36.4 C)  SpO2: 99%   Filed Weights   11/27/22 1057  Weight: 201 lb 8 oz (91.4 kg)   .Body mass index is 29.76 kg/m.  GENERAL:alert, in no acute distress  and comfortable SKIN: no acute rashes, no significant lesions EYES: conjunctiva are pink and non-injected, sclera anicteric OROPHARYNX: MMM, no exudates, no oropharyngeal erythema or ulceration NECK: supple, no JVD LYMPH:  no palpable lymphadenopathy in the cervical, axillary or inguinal regions LUNGS: clear to auscultation b/l with normal respiratory effort HEART: regular rate & rhythm ABDOMEN:  normoactive bowel sounds , non tender, not distended. Extremity: no pedal edema PSYCH: alert & oriented x 3 with fluent speech NEURO: no focal motor/sensory deficits  LABORATORY DATA:  I have reviewed the data as listed  .    Latest Ref Rng & Units 08/23/2021    2:32 PM 07/26/2020   10:54 AM 06/22/2020    2:46 PM  CBC  WBC 4.0 - 10.5 K/uL 9.4  8.5  9.5   Hemoglobin 12.0 - 15.0 g/dL 52.8  41.3  24.4   Hematocrit 36.0 - 46.0 % 40.0  39.2  38.2   Platelets 150.0 - 400.0 K/uL 269.0  210.0  215.0     .    Latest Ref Rng & Units 10/30/2022    5:34 PM 08/20/2022    2:26 PM 08/23/2021    2:32 PM  CMP  Glucose 70 - 99 mg/dL  73  99   BUN 6 - 23 mg/dL  16  16   Creatinine 0.10 - 1.00 mg/dL 2.72  5.36  6.44   Sodium 135 - 145 mEq/L  142  141   Potassium 3.5 - 5.1 mEq/L  4.5  4.4   Chloride 96 - 112 mEq/L  103  104   CO2 19 - 32 mEq/L  31  29   Calcium 8.4 - 10.5 mg/dL  8.7  9.0   Total Protein 6.0 - 8.3 g/dL  6.4  6.3   Total Bilirubin 0.2 - 1.2 mg/dL  0.4  0.4   Alkaline Phos 39 -  117 U/L  65  65   AST 0 - 37 U/L  17  12   ALT 0 - 35 U/L  11  8      RADIOGRAPHIC STUDIES: I have personally reviewed the radiological images as listed and agreed with the findings in the report. CT CHEST ABDOMEN PELVIS W CONTRAST  Result Date: 10/30/2022 CLINICAL DATA:  Weight loss, follow-up follicular lymphoma EXAM: CT CHEST, ABDOMEN, AND PELVIS WITH CONTRAST TECHNIQUE: Multidetector CT imaging of the chest, abdomen and pelvis was performed following the standard protocol during bolus administration of  intravenous contrast. RADIATION DOSE REDUCTION: This exam was performed according to the departmental dose-optimization program which includes automated exposure control, adjustment of the mA and/or kV according to patient size and/or use of iterative reconstruction technique. CONTRAST:  OMNIPAQUE IOHEXOL 300 MG/ML  SOLN COMPARISON:  CTA chest dated 08/22/2020. CT abdomen/pelvis dated 02/18/2018. FINDINGS: CT CHEST FINDINGS Cardiovascular: The heart is normal in size. No pericardial effusion. No evidence thoracic aortic aneurysm. Atherosclerotic calcifications of the aortic arch. Mild three-vessel coronary atherosclerosis. Mediastinum/Nodes: No suspicious mediastinal, hilar, or axillary lymphadenopathy. Visualized thyroid is unremarkable. Lungs/Pleura: Mild scarring/atelectasis in the posterior left lung apex. Mild subpleural reticulation/fibrosis in the lungs bilaterally, lower lobe predominant, suggesting mild chronic interstitial lung disease. No focal consolidation. No suspicious pulmonary nodules. No pleural effusion or pneumothorax. Musculoskeletal: Degenerative changes of the mid thoracic spine. Thoracic spine stimulator. CT ABDOMEN PELVIS FINDINGS Hepatobiliary: Liver is within normal limits. Status post cholecystectomy. No intrahepatic or extrahepatic duct dilatation. Pancreas: Within normal limits. Spleen: Normal in size, without focal lesion. Adrenals/Urinary Tract: Adrenal glands are within normal limits. Kidneys are within normal limits.  No hydronephrosis. Bladder is within normal limits. Stomach/Bowel: Stomach is within normal limits. No evidence of bowel obstruction. Normal appendix (series 2/image 100). Mild left colonic diverticulosis, without evidence of diverticulitis. Vascular/Lymphatic: No evidence of abdominal aortic aneurysm. Atherosclerotic calcifications of the abdominal aorta and branch vessels, although vessels remain patent. Retroperitoneal/pelvic lymphadenopathy, new/progressive  from remote prior, including: --18 mm short axis left para-aortic node (series 2/image 69), previously 12 mm --12 mm short axis right aortocaval node (series 2/image 81), previously 6 mm --14 mm short axis left external iliac node (series 2/image 93), new --11 mm short axis right external iliac node (series 2/image 105), new Reproductive: Uterus is unremarkable. Bilateral ovaries are within normal limits. Other: No abdominopelvic ascites. Musculoskeletal: Status post PLIF at L4-5. Degenerative changes of the lumbar spine. IMPRESSION: Retroperitoneal/pelvic lymphadenopathy, new/progressive from remote prior, suspicious for recurrent lymphoma. No evidence of thoracic lymphadenopathy. Spleen is normal in size without focal lesion. Additional ancillary findings as above. Aortic Atherosclerosis (ICD10-I70.0). Electronically Signed   By: Charline Bills M.D.   On: 10/30/2022 18:18    ASSESSMENT & PLAN:   82 y.o. Caucasian female with multiple chronic medical comorbidities as noted above with    #1 h/o Stage IVB Low grade Follicular Lymphoma diagnosed in 2018 with extensive retroperitoneal lymphadenopathy and right inguinal lymphadenopathy as well as osseous involvement of the sacrum. LDH level within normal limits.  S/p 5 cycles of BR (Bendamustine 70mg /m2)   PET CT scan after 5 cycles of Bendamustine and Rituxan done on 09/06/2016 shows no residual metabolic active disease and no evidence of disease progression.   CT C/A/P on 05/20/17 reveals no radiographic evidence of disease progression.    02/19/18 CT C/A/P revealed Stable mild retroperitoneal adenopathy, compatible with treated lymphoma. No new or progressive adenopathy. No new sites of disease. Normal size spleen.  2. Three-vessel coronary atherosclerosis. 3. Aortic Atherosclerosis.  PLAN: -Discussed CT scan results from 10/30/2022 with the patient. Showed Retroperitoneal/pelvic lymphadenopathy, new/progressive from remote prior, suspicious for  recurrent lymphoma. No evidence of thoracic lymphadenopathy. Spleen is normal in size without focal lesion. -Discussed with the patient that according to CT scans, it does like her lymphoma is growing back but she has non bulky disease -Discussed with the patient that her night sweats are controlled by Paxil.  -Discussed that we need to check everything with lab results with thyroid level. Patient agrees.  -labs done today were reviewed Cbc/diff- shows mild anemia hgb 10.1 with normal wbc count and platelets Cmp- K 3.3, otherwise stable. LDH WNL TSH and Ft4 -- WNL -Patient has no uncontrolled symptoms, significant cytopenia or bulky disease from her low grade FL to suggest need to initiate additional treatment for her FL at this time.  . Orders Placed This Encounter  Procedures   CBC with Differential (Cancer Center Only)    Standing Status:   Future    Number of Occurrences:   1    Standing Expiration Date:   11/27/2023   CMP (Cancer Center only)    Standing Status:   Future    Number of Occurrences:   1    Standing Expiration Date:   11/27/2023   Lactate dehydrogenase    Standing Status:   Future    Number of Occurrences:   1    Standing Expiration Date:   11/27/2023   TSH    Standing Status:   Future    Number of Occurrences:   1    Standing Expiration Date:   11/27/2023   T4, free    Standing Status:   Future    Number of Occurrences:   1    Standing Expiration Date:   11/27/2023    FOLLOW-UP: Labs today RTC with Dr Candise Che with labs in 6 months  .The total time spent in the appointment was 60 minutes* .  All of the patient's questions were answered with apparent satisfaction. The patient knows to call the clinic with any problems, questions or concerns.   Wyvonnia Lora MD MS AAHIVMS [facility] Hematology/Oncology Physician [facility]  .*Total Encounter Time as defined by the Centers for Medicare and Medicaid Services includes, in addition to the face-to-face  time of a patient visit (documented in the note above) non-face-to-face time: obtaining and reviewing outside history, ordering and reviewing medications, tests or procedures, care coordination (communications with other health care professionals or caregivers) and documentation in the medical record.   11/27/2022 9:36 AM   I,Param Shah,acting as a scribe for Wyvonnia Lora, MD.,have documented all relevant documentation on the behalf of Wyvonnia Lora, MD,as directed by  Wyvonnia Lora, MD while in the presence of Wyvonnia Lora, MD.

## 2022-12-08 ENCOUNTER — Other Ambulatory Visit: Payer: Self-pay | Admitting: Cardiovascular Disease

## 2022-12-10 ENCOUNTER — Telehealth: Payer: Self-pay | Admitting: Cardiovascular Disease

## 2022-12-10 DIAGNOSIS — I480 Paroxysmal atrial fibrillation: Secondary | ICD-10-CM

## 2022-12-10 MED ORDER — APIXABAN 5 MG PO TABS: 5.0000 mg | ORAL_TABLET | Freq: Two times a day (BID) | ORAL | 1 refills | Status: DC

## 2022-12-10 NOTE — Telephone Encounter (Signed)
Prescription refill request for Eliquis received. Indication: PAF Last office visit: 10/29/22  Concha Se MD Scr: 0.92 on 11/27/22  Epic Age: 82 Weight: 87.6kg  Based on above findings Eliquis 5mg  twice daily is the appropriate dose.  Refill approved.

## 2022-12-10 NOTE — Telephone Encounter (Signed)
*  STAT* If patient is at the pharmacy, call can be transferred to refill team.   1. Which medications need to be refilled? (please list name of each medication and dose if known) Eliquis 5mg    2. Would you like to learn more about the convenience, safety, & potential cost savings by using the [facility]? na   3. Are you open to using the [facility] .na    4. Which pharmacy/location (including street and city if local pharmacy) is medication to be sent to?[facility]   5. Do they need a 30 day or 90 day supply? 90 day

## 2022-12-10 NOTE — Telephone Encounter (Signed)
Please review

## 2023-01-02 ENCOUNTER — Encounter: Payer: Self-pay | Admitting: Family

## 2023-01-02 ENCOUNTER — Ambulatory Visit: Payer: Medicare PPO | Admitting: Family

## 2023-01-02 VITALS — BP 136/76 | HR 78 | Temp 98.1°F | Ht 69.0 in | Wt 195.8 lb

## 2023-01-02 DIAGNOSIS — R61 Generalized hyperhidrosis: Secondary | ICD-10-CM

## 2023-01-02 DIAGNOSIS — M79672 Pain in left foot: Secondary | ICD-10-CM | POA: Diagnosis not present

## 2023-01-02 DIAGNOSIS — C8298 Follicular lymphoma, unspecified, lymph nodes of multiple sites: Secondary | ICD-10-CM

## 2023-01-02 MED ORDER — GABAPENTIN 100 MG PO CAPS
200.0000 mg | ORAL_CAPSULE | Freq: Three times a day (TID) | ORAL | 3 refills | Status: DC
Start: 2023-01-02 — End: 2023-04-04

## 2023-01-02 NOTE — Progress Notes (Signed)
Assessment & Plan:  Follicular lymphoma of lymph nodes of multiple regions, unspecified follicular lymphoma type (HCC)  Left foot pain Assessment & Plan: Suboptimal control. Increase gabapentin to 200mg  TID ; after 2 weeks, advised she may then increase to gabapentin 300mg  tid if needed. Pending ABI.  Orders: -     Gabapentin; Take 2 capsules (200 mg total) by mouth 3 (three) times daily.  Dispense: 270 capsule; Refill: 3 -     US ARTERIAL ABI (SCREENING LOWER EXTREMITY); Future  Excessive sweating Assessment & Plan: Improved with Paxil 10mg , will continue. She is following with oncology, will follow.       Return precautions given.   Risks, benefits, and alternatives of the medications and treatment plan prescribed today were discussed, and patient expressed understanding.   Education regarding symptom management and diagnosis given to patient on AVS either electronically or printed.  No follow-ups on file.  Rennie Plowman, FNP  Subjective:    Patient ID: Samantha [name], female    DOB: 02-22-1941, 82 y.o.   MRN: [id number]  CC: Samantha [name] is a 82 y.o. female who presents today for follow up.   HPI: Feels well today No new complaints  She is feeling better on the paxil. Hot flashes resolved and anxiety improved  Denies depression  She does report that gabapentin 100mg  tid for peripheral numbness isnt that helpful. Previously she had been on 300mg  tid Denies sedation on medication   Seen 11/27/2022 for oncology for follicular lymphoma, Dr Candise Che  Reports no uncontrolled symptoms significant cytopenias or bulky disease, low-grade follicular lymphoma to initiate additional treatment.  Allergies: Hydrocodone and Oxycodone Current Outpatient Medications on File Prior to Visit  Medication Sig Dispense Refill   ALPRAZolam (XANAX) 0.25 MG tablet TAKE 1 TABLET EVERY DAY AS NEEDED FOR ANXIETY 30 tablet 2   amiodarone (PACERONE) 200 MG tablet TAKE 1/2 TABLET EVERY MORNING  45 tablet 3   amLODipine (NORVASC) 2.5 MG tablet Take 1 tablet (2.5 mg total) by mouth daily. 90 tablet 3   apixaban (ELIQUIS) 5 MG TABS tablet Take 1 tablet (5 mg total) by mouth 2 (two) times daily. 180 tablet 1   atorvastatin (LIPITOR) 40 MG tablet *NEED OFFICE VISIT* TAKE 1 TABLET EVERY DAY 90 tablet 3   fluticasone (FLONASE) 50 MCG/ACT nasal spray Place 2 sprays into both nostrils daily. 16 g 3   metoprolol succinate (TOPROL-XL) 50 MG 24 hr tablet Take 1 tablet (50 mg total) by mouth daily. 90 tablet 3   nitroGLYCERIN (NITROSTAT) 0.4 MG SL tablet Place 1 tablet (0.4 mg total) under the tongue every 5 (five) minutes x 3 doses as needed for chest pain. 25 tablet 0   ofloxacin (OCUFLOX) 0.3 % ophthalmic solution Place 1 drop into the left eye 3 (three) times daily.     OVER THE COUNTER MEDICATION Energy beet gummy     PARoxetine (PAXIL) 10 MG tablet Take 1 tablet (10 mg total) by mouth daily. 30 tablet 3   timolol (TIMOPTIC) 0.5 % ophthalmic solution INSTILL 1 DROP IN EACH EYE IN THE MORNING AND 1 DROP IN [facility] EYE AT NIGHT 10 mL 3   No current facility-administered medications on file prior to visit.    Review of Systems  Constitutional:  Negative for chills and fever.  Respiratory:  Negative for cough.   Cardiovascular:  Negative for chest pain and palpitations.  Gastrointestinal:  Negative for nausea and vomiting.  Neurological:  Positive for numbness.  Objective:    BP 136/76   Pulse 78   Temp 98.1 F (36.7 C) (Oral)   Ht 5\' 9"  (1.753 m)   Wt 195 lb 12.8 oz (88.8 kg)   SpO2 96%   BMI 28.91 kg/m  BP Readings from Last 3 Encounters:  01/02/23 136/76  11/27/22 (!) 153/56  10/30/22 124/78   Wt Readings from Last 3 Encounters:  01/02/23 195 lb 12.8 oz (88.8 kg)  11/27/22 201 lb 8 oz (91.4 kg)  10/30/22 192 lb 12.8 oz (87.5 kg)    Physical Exam Vitals reviewed.  Constitutional:      Appearance: She is well-developed.  Eyes:     Conjunctiva/sclera: Conjunctivae  normal.  Cardiovascular:     Rate and Rhythm: Normal rate and regular rhythm.     Pulses: Normal pulses.     Heart sounds: Normal heart sounds.  Pulmonary:     Effort: Pulmonary effort is normal.     Breath sounds: Normal breath sounds. No wheezing, rhonchi or rales.  Skin:    General: Skin is warm and dry.  Neurological:     Mental Status: She is alert.  Psychiatric:        Speech: Speech normal.        Behavior: Behavior normal.        Thought Content: Thought content normal.

## 2023-01-02 NOTE — Assessment & Plan Note (Addendum)
Suboptimal control. Increase gabapentin to 200mg  TID ; after 2 weeks, advised she may then increase to gabapentin 300mg  tid if needed. Pending ABI.

## 2023-01-02 NOTE — Assessment & Plan Note (Signed)
Improved with Paxil 10mg , will continue. She is following with oncology, will follow.

## 2023-01-02 NOTE — Patient Instructions (Signed)
Increase gabapentin to 200 mg 3 times a day.  After  2 weeks if pain is not improved, you may increase to 300 mg 3 times a day.   Please monitor for excessive sedation.  Nice to see you!

## 2023-01-07 ENCOUNTER — Ambulatory Visit: Payer: Medicare PPO | Admitting: Family

## 2023-01-08 ENCOUNTER — Ambulatory Visit
Admission: RE | Admit: 2023-01-08 | Discharge: 2023-01-08 | Disposition: A | Discharge status: Home / Self Care | Payer: Medicare PPO | Source: Ambulatory Visit | Attending: Family | Admitting: Family

## 2023-01-08 DIAGNOSIS — I10 Essential (primary) hypertension: Secondary | ICD-10-CM | POA: Diagnosis not present

## 2023-01-08 DIAGNOSIS — M79672 Pain in left foot: Secondary | ICD-10-CM | POA: Diagnosis not present

## 2023-01-08 DIAGNOSIS — M4306 Spondylolysis, lumbar region: Secondary | ICD-10-CM | POA: Diagnosis not present

## 2023-01-08 DIAGNOSIS — M9901 Segmental and somatic dysfunction of cervical region: Secondary | ICD-10-CM | POA: Diagnosis not present

## 2023-01-08 DIAGNOSIS — M9903 Segmental and somatic dysfunction of lumbar region: Secondary | ICD-10-CM | POA: Diagnosis not present

## 2023-01-08 DIAGNOSIS — M542 Cervicalgia: Secondary | ICD-10-CM | POA: Diagnosis not present

## 2023-01-08 DIAGNOSIS — I739 Peripheral vascular disease, unspecified: Secondary | ICD-10-CM | POA: Diagnosis not present

## 2023-01-08 DIAGNOSIS — E785 Hyperlipidemia, unspecified: Secondary | ICD-10-CM | POA: Diagnosis not present

## 2023-01-10 ENCOUNTER — Ambulatory Visit: Payer: Medicare PPO | Admitting: Family

## 2023-01-11 ENCOUNTER — Encounter: Payer: Self-pay | Admitting: *Deleted

## 2023-01-11 NOTE — Progress Notes (Signed)
Sent mychart message

## 2023-01-28 DIAGNOSIS — H40153 Residual stage of open-angle glaucoma, bilateral: Secondary | ICD-10-CM | POA: Diagnosis not present

## 2023-01-30 NOTE — Telephone Encounter (Signed)
Error

## 2023-02-05 DIAGNOSIS — M4306 Spondylolysis, lumbar region: Secondary | ICD-10-CM | POA: Diagnosis not present

## 2023-02-05 DIAGNOSIS — M9903 Segmental and somatic dysfunction of lumbar region: Secondary | ICD-10-CM | POA: Diagnosis not present

## 2023-02-05 DIAGNOSIS — M9901 Segmental and somatic dysfunction of cervical region: Secondary | ICD-10-CM | POA: Diagnosis not present

## 2023-02-05 DIAGNOSIS — M542 Cervicalgia: Secondary | ICD-10-CM | POA: Diagnosis not present

## 2023-02-09 ENCOUNTER — Other Ambulatory Visit: Payer: Self-pay | Admitting: Family

## 2023-02-09 DIAGNOSIS — R61 Generalized hyperhidrosis: Secondary | ICD-10-CM

## 2023-02-09 DIAGNOSIS — F325 Major depressive disorder, single episode, in full remission: Secondary | ICD-10-CM

## 2023-02-13 ENCOUNTER — Other Ambulatory Visit: Payer: Self-pay | Admitting: Family

## 2023-02-13 DIAGNOSIS — F325 Major depressive disorder, single episode, in full remission: Secondary | ICD-10-CM

## 2023-02-13 DIAGNOSIS — R61 Generalized hyperhidrosis: Secondary | ICD-10-CM

## 2023-02-13 MED ORDER — PAROXETINE HCL 10 MG PO TABS: 10.0000 mg | ORAL_TABLET | Freq: Every day | ORAL | 3 refills | Status: DC

## 2023-03-05 DIAGNOSIS — M9903 Segmental and somatic dysfunction of lumbar region: Secondary | ICD-10-CM | POA: Diagnosis not present

## 2023-03-05 DIAGNOSIS — M9901 Segmental and somatic dysfunction of cervical region: Secondary | ICD-10-CM | POA: Diagnosis not present

## 2023-03-05 DIAGNOSIS — M4306 Spondylolysis, lumbar region: Secondary | ICD-10-CM | POA: Diagnosis not present

## 2023-03-05 DIAGNOSIS — M542 Cervicalgia: Secondary | ICD-10-CM | POA: Diagnosis not present

## 2023-03-20 DIAGNOSIS — J189 Pneumonia, unspecified organism: Secondary | ICD-10-CM

## 2023-03-20 HISTORY — DX: Pneumonia, unspecified organism: J18.9

## 2023-04-02 DIAGNOSIS — M542 Cervicalgia: Secondary | ICD-10-CM | POA: Diagnosis not present

## 2023-04-02 DIAGNOSIS — M9901 Segmental and somatic dysfunction of cervical region: Secondary | ICD-10-CM | POA: Diagnosis not present

## 2023-04-02 DIAGNOSIS — M9903 Segmental and somatic dysfunction of lumbar region: Secondary | ICD-10-CM | POA: Diagnosis not present

## 2023-04-02 DIAGNOSIS — M4306 Spondylolysis, lumbar region: Secondary | ICD-10-CM | POA: Diagnosis not present

## 2023-04-04 ENCOUNTER — Encounter: Payer: Self-pay | Admitting: Family

## 2023-04-04 ENCOUNTER — Ambulatory Visit (INDEPENDENT_AMBULATORY_CARE_PROVIDER_SITE_OTHER): Payer: Medicare HMO | Admitting: Family

## 2023-04-04 VITALS — BP 132/76 | HR 58 | Temp 98.5°F | Ht 69.0 in | Wt 191.0 lb

## 2023-04-04 DIAGNOSIS — Z8639 Personal history of other endocrine, nutritional and metabolic disease: Secondary | ICD-10-CM

## 2023-04-04 DIAGNOSIS — F419 Anxiety disorder, unspecified: Secondary | ICD-10-CM | POA: Diagnosis not present

## 2023-04-04 DIAGNOSIS — Z136 Encounter for screening for cardiovascular disorders: Secondary | ICD-10-CM | POA: Diagnosis not present

## 2023-04-04 DIAGNOSIS — R7303 Prediabetes: Secondary | ICD-10-CM | POA: Diagnosis not present

## 2023-04-04 DIAGNOSIS — M79672 Pain in left foot: Secondary | ICD-10-CM | POA: Diagnosis not present

## 2023-04-04 DIAGNOSIS — I2510 Atherosclerotic heart disease of native coronary artery without angina pectoris: Secondary | ICD-10-CM

## 2023-04-04 DIAGNOSIS — I10 Essential (primary) hypertension: Secondary | ICD-10-CM

## 2023-04-04 DIAGNOSIS — Z13220 Encounter for screening for lipoid disorders: Secondary | ICD-10-CM

## 2023-04-04 DIAGNOSIS — R61 Generalized hyperhidrosis: Secondary | ICD-10-CM

## 2023-04-04 DIAGNOSIS — I700 Atherosclerosis of aorta: Secondary | ICD-10-CM | POA: Diagnosis not present

## 2023-04-04 DIAGNOSIS — F32A Depression, unspecified: Secondary | ICD-10-CM

## 2023-04-04 DIAGNOSIS — D649 Anemia, unspecified: Secondary | ICD-10-CM

## 2023-04-04 LAB — URINALYSIS, ROUTINE W REFLEX MICROSCOPIC
Hgb urine dipstick: NEGATIVE
Nitrite: NEGATIVE
Specific Gravity, Urine: 1.025 (ref 1.000–1.030)
Urine Glucose: NEGATIVE
Urobilinogen, UA: 0.2 (ref 0.0–1.0)
pH: 6 (ref 5.0–8.0)

## 2023-04-04 MED ORDER — ATORVASTATIN CALCIUM 40 MG PO TABS: 40.0000 mg | ORAL_TABLET | Freq: Every day | ORAL | 3 refills | Status: DC

## 2023-04-04 MED ORDER — GABAPENTIN 100 MG PO CAPS: 200.0000 mg | ORAL_CAPSULE | Freq: Two times a day (BID) | ORAL | Status: DC

## 2023-04-04 MED ORDER — METOPROLOL SUCCINATE ER 50 MG PO TB24: 50.0000 mg | ORAL_TABLET | Freq: Every day | ORAL | 3 refills | Status: DC

## 2023-04-04 NOTE — Progress Notes (Signed)
Assessment & Plan:  Primary hypertension -     Atorvastatin Calcium; Take 1 tablet (40 mg total) by mouth daily. *NEED OFFICE VISIT* TAKE 1 TABLET EVERY DAY  Dispense: 90 tablet; Refill: 3  Anxiety and depression Assessment & Plan: Chronic, stable.  Continue Xanax 0.25 mg daily, Paxil 10 mg qd   Excessive sweating -     TSH -     Lipid panel -     Comprehensive metabolic panel -     CBC with Differential/Platelet -     VITAMIN D 25 Hydroxy (Vit-D Deficiency, Fractures)  Encounter for lipid screening for cardiovascular disease -     Lipid panel  History of vitamin D deficiency -     VITAMIN D 25 Hydroxy (Vit-D Deficiency, Fractures)  Left foot pain Assessment & Plan: Chronic, stable. Continue gabapentin 200mg  BID.   Orders: -     Gabapentin; Take 2 capsules (200 mg total) by mouth 2 (two) times daily.  Coronary artery disease involving native coronary artery of native heart without angina pectoris  Atherosclerosis of aorta [facility]) Assessment & Plan: Chronic, symptomatically stable.  Pending lipid panel.  Continue Lipitor 40mg  qd  Orders: -     Metoprolol Succinate ER; Take 1 tablet (50 mg total) by mouth daily.  Dispense: 90 tablet; Refill: 3  Anemia, unspecified type -     Urinalysis, Routine w reflex microscopic  Prediabetes -     Hemoglobin A1c     Return precautions given.   Risks, benefits, and alternatives of the medications and treatment plan prescribed today were discussed, and patient expressed understanding.   Education regarding symptom management and diagnosis given to patient on AVS either electronically or printed.  Return in about 3 months (around 07/03/2023).  Rennie Plowman, FNP  Subjective:    Patient ID: Samantha [name], female    DOB: [birth year], 83 y.o.   MRN: [id number]  CC: Samantha [name] is a 83 y.o. female who presents today for follow up.   HPI: Feels well today.  No new complaints  She is pleased with level of pain control with  spinal cord stimulator. She feels gabapentin is working well for her.  Compliant with gabapentin 200 mg twice daily.  She often forgets the midday dose   Compliant with Paxil 10 mg daily.  She takes Xanax daily Follow-up scheduled with oncology 05/27/2023  Allergies: Hydrocodone and Oxycodone Current Outpatient Medications on File Prior to Visit  Medication Sig Dispense Refill   ALPRAZolam (XANAX) 0.25 MG tablet TAKE 1 TABLET EVERY DAY AS NEEDED FOR ANXIETY 30 tablet 2   amiodarone (PACERONE) 200 MG tablet TAKE 1/2 TABLET EVERY MORNING 45 tablet 3   amLODipine (NORVASC) 2.5 MG tablet Take 1 tablet (2.5 mg total) by mouth daily. 90 tablet 3   apixaban (ELIQUIS) 5 MG TABS tablet Take 1 tablet (5 mg total) by mouth 2 (two) times daily. 180 tablet 1   nitroGLYCERIN (NITROSTAT) 0.4 MG SL tablet Place 1 tablet (0.4 mg total) under the tongue every 5 (five) minutes x 3 doses as needed for chest pain. 25 tablet 0   ofloxacin (OCUFLOX) 0.3 % ophthalmic solution Place 1 drop into the left eye 3 (three) times daily.     OVER THE COUNTER MEDICATION Energy beet gummy     PARoxetine (PAXIL) 10 MG tablet Take 1 tablet (10 mg total) by mouth daily. 90 tablet 3   timolol (TIMOPTIC) 0.5 % ophthalmic solution INSTILL 1 DROP IN [facility] EYE  IN THE MORNING AND 1 DROP IN [facility] EYE AT NIGHT 10 mL 3   No current facility-administered medications on file prior to visit.    Review of Systems  Constitutional:  Negative for chills and fever.  Respiratory:  Negative for cough.   Cardiovascular:  Negative for chest pain and palpitations.  Gastrointestinal:  Negative for nausea and vomiting.  Neurological:  Positive for numbness (left leg).      Objective:    BP 132/76   Pulse (!) 58   Temp 98.5 F (36.9 C)   Ht 5\' 9"  (1.753 m)   Wt 191 lb (86.6 kg)   SpO2 92%   BMI 28.21 kg/m  BP Readings from Last 3 Encounters:  04/04/23 132/76  01/02/23 136/76  11/27/22 (!) 153/56   Wt Readings from Last 3 Encounters:   04/04/23 191 lb (86.6 kg)  01/02/23 195 lb 12.8 oz (88.8 kg)  11/27/22 201 lb 8 oz (91.4 kg)    Physical Exam Vitals reviewed.  Constitutional:      Appearance: She is well-developed.  Eyes:     Conjunctiva/sclera: Conjunctivae normal.  Cardiovascular:     Rate and Rhythm: Normal rate and regular rhythm.     Pulses: Normal pulses.     Heart sounds: Normal heart sounds.  Pulmonary:     Effort: Pulmonary effort is normal.     Breath sounds: Normal breath sounds. No wheezing, rhonchi or rales.  Skin:    General: Skin is warm and dry.  Neurological:     Mental Status: She is alert.  Psychiatric:        Speech: Speech normal.        Behavior: Behavior normal.        Thought Content: Thought content normal.

## 2023-04-04 NOTE — Assessment & Plan Note (Signed)
Chronic, stable.  Continue Xanax 0.25 mg daily, Paxil 10 mg qd

## 2023-04-04 NOTE — Assessment & Plan Note (Signed)
Chronic, stable. Continue gabapentin '200mg'$  BID

## 2023-04-04 NOTE — Assessment & Plan Note (Signed)
Chronic, symptomatically stable.  Pending lipid panel.  Continue Lipitor 40mg  qd

## 2023-04-05 LAB — CBC WITH DIFFERENTIAL/PLATELET
Basophils Absolute: 0.1 10*3/uL (ref 0.0–0.1)
Basophils Relative: 1.3 % (ref 0.0–3.0)
Eosinophils Absolute: 0.1 10*3/uL (ref 0.0–0.7)
Eosinophils Relative: 1.4 % (ref 0.0–5.0)
HCT: 31.5 % — ABNORMAL LOW (ref 36.0–46.0)
Hemoglobin: 9.5 g/dL — ABNORMAL LOW (ref 12.0–15.0)
Lymphocytes Relative: 25.1 % (ref 12.0–46.0)
Lymphs Abs: 2.2 10*3/uL (ref 0.7–4.0)
MCHC: 30.3 g/dL (ref 30.0–36.0)
MCV: 78.1 fL (ref 78.0–100.0)
Monocytes Absolute: 1.1 10*3/uL — ABNORMAL HIGH (ref 0.1–1.0)
Monocytes Relative: 12.8 % — ABNORMAL HIGH (ref 3.0–12.0)
Neutro Abs: 5.2 10*3/uL (ref 1.4–7.7)
Neutrophils Relative %: 59.4 % (ref 43.0–77.0)
Platelets: 255 10*3/uL (ref 150.0–400.0)
RBC: 4.04 Mil/uL (ref 3.87–5.11)
RDW: 16.5 % — ABNORMAL HIGH (ref 11.5–15.5)
WBC: 8.7 10*3/uL (ref 4.0–10.5)

## 2023-04-05 LAB — HEMOGLOBIN A1C: Hgb A1c MFr Bld: 6 % (ref 4.6–6.5)

## 2023-04-05 LAB — COMPREHENSIVE METABOLIC PANEL WITH GFR
ALT: 8 U/L (ref 0–35)
AST: 16 U/L (ref 0–37)
Albumin: 3.6 g/dL (ref 3.5–5.2)
Alkaline Phosphatase: 60 U/L (ref 39–117)
BUN: 12 mg/dL (ref 6–23)
CO2: 29 meq/L (ref 19–32)
Calcium: 8.6 mg/dL (ref 8.4–10.5)
Chloride: 105 meq/L (ref 96–112)
Creatinine, Ser: 0.83 mg/dL (ref 0.40–1.20)
GFR: 66.19 mL/min
Glucose, Bld: 91 mg/dL (ref 70–99)
Potassium: 4.4 meq/L (ref 3.5–5.1)
Sodium: 142 meq/L (ref 135–145)
Total Bilirubin: 0.3 mg/dL (ref 0.2–1.2)
Total Protein: 6.5 g/dL (ref 6.0–8.3)

## 2023-04-05 LAB — LIPID PANEL
Cholesterol: 94 mg/dL (ref 0–200)
HDL: 31.9 mg/dL — ABNORMAL LOW (ref >39.00)
LDL Cholesterol: 45 mg/dL (ref 0–99)
NonHDL: 61.6
Total CHOL/HDL Ratio: 3
Triglycerides: 84 mg/dL (ref 0.0–149.0)
VLDL: 16.8 mg/dL (ref 0.0–40.0)

## 2023-04-05 LAB — TSH: TSH: 2.78 u[IU]/mL (ref 0.35–5.50)

## 2023-04-05 LAB — VITAMIN D 25 HYDROXY (VIT D DEFICIENCY, FRACTURES): VITD: 16.77 ng/mL — ABNORMAL LOW (ref 30.00–100.00)

## 2023-04-08 ENCOUNTER — Telehealth: Payer: Self-pay

## 2023-04-08 ENCOUNTER — Ambulatory Visit: Payer: Self-pay | Admitting: Family

## 2023-04-08 ENCOUNTER — Encounter: Payer: Self-pay | Admitting: Family

## 2023-04-08 ENCOUNTER — Inpatient Hospital Stay (HOSPITAL_COMMUNITY)
Admission: EM | Admit: 2023-04-08 | Discharge: 2023-04-10 | DRG: 194 | Disposition: A | Discharge status: Home Health | Payer: Medicare HMO | Attending: Internal Medicine | Admitting: Internal Medicine

## 2023-04-08 ENCOUNTER — Emergency Department (HOSPITAL_COMMUNITY): Payer: Medicare HMO

## 2023-04-08 ENCOUNTER — Other Ambulatory Visit: Payer: Self-pay

## 2023-04-08 ENCOUNTER — Other Ambulatory Visit: Payer: Self-pay | Admitting: Family

## 2023-04-08 DIAGNOSIS — F419 Anxiety disorder, unspecified: Secondary | ICD-10-CM | POA: Diagnosis present

## 2023-04-08 DIAGNOSIS — Z860100 Personal history of colon polyps, unspecified: Secondary | ICD-10-CM | POA: Diagnosis not present

## 2023-04-08 DIAGNOSIS — D649 Anemia, unspecified: Secondary | ICD-10-CM | POA: Diagnosis not present

## 2023-04-08 DIAGNOSIS — M1990 Unspecified osteoarthritis, unspecified site: Secondary | ICD-10-CM | POA: Diagnosis present

## 2023-04-08 DIAGNOSIS — K5730 Diverticulosis of large intestine without perforation or abscess without bleeding: Secondary | ICD-10-CM | POA: Diagnosis not present

## 2023-04-08 DIAGNOSIS — I480 Paroxysmal atrial fibrillation: Secondary | ICD-10-CM | POA: Diagnosis present

## 2023-04-08 DIAGNOSIS — Z8249 Family history of ischemic heart disease and other diseases of the circulatory system: Secondary | ICD-10-CM

## 2023-04-08 DIAGNOSIS — Z66 Do not resuscitate: Secondary | ICD-10-CM | POA: Diagnosis not present

## 2023-04-08 DIAGNOSIS — Z955 Presence of coronary angioplasty implant and graft: Secondary | ICD-10-CM | POA: Diagnosis not present

## 2023-04-08 DIAGNOSIS — J189 Pneumonia, unspecified organism: Principal | ICD-10-CM | POA: Diagnosis present

## 2023-04-08 DIAGNOSIS — F17210 Nicotine dependence, cigarettes, uncomplicated: Secondary | ICD-10-CM | POA: Diagnosis present

## 2023-04-08 DIAGNOSIS — R0602 Shortness of breath: Secondary | ICD-10-CM | POA: Diagnosis not present

## 2023-04-08 DIAGNOSIS — Z79899 Other long term (current) drug therapy: Secondary | ICD-10-CM

## 2023-04-08 DIAGNOSIS — Z7901 Long term (current) use of anticoagulants: Secondary | ICD-10-CM

## 2023-04-08 DIAGNOSIS — D62 Acute posthemorrhagic anemia: Secondary | ICD-10-CM | POA: Diagnosis not present

## 2023-04-08 DIAGNOSIS — R0902 Hypoxemia: Secondary | ICD-10-CM | POA: Diagnosis not present

## 2023-04-08 DIAGNOSIS — I252 Old myocardial infarction: Secondary | ICD-10-CM

## 2023-04-08 DIAGNOSIS — R195 Other fecal abnormalities: Secondary | ICD-10-CM

## 2023-04-08 DIAGNOSIS — I10 Essential (primary) hypertension: Secondary | ICD-10-CM | POA: Diagnosis present

## 2023-04-08 DIAGNOSIS — Z823 Family history of stroke: Secondary | ICD-10-CM | POA: Diagnosis not present

## 2023-04-08 DIAGNOSIS — K921 Melena: Secondary | ICD-10-CM | POA: Diagnosis not present

## 2023-04-08 DIAGNOSIS — G8929 Other chronic pain: Secondary | ICD-10-CM | POA: Diagnosis present

## 2023-04-08 DIAGNOSIS — K922 Gastrointestinal hemorrhage, unspecified: Secondary | ICD-10-CM | POA: Diagnosis present

## 2023-04-08 DIAGNOSIS — Z9049 Acquired absence of other specified parts of digestive tract: Secondary | ICD-10-CM

## 2023-04-08 DIAGNOSIS — I2510 Atherosclerotic heart disease of native coronary artery without angina pectoris: Secondary | ICD-10-CM | POA: Diagnosis not present

## 2023-04-08 DIAGNOSIS — J440 Chronic obstructive pulmonary disease with acute lower respiratory infection: Secondary | ICD-10-CM | POA: Diagnosis present

## 2023-04-08 DIAGNOSIS — E7800 Pure hypercholesterolemia, unspecified: Secondary | ICD-10-CM | POA: Diagnosis present

## 2023-04-08 DIAGNOSIS — M549 Dorsalgia, unspecified: Secondary | ICD-10-CM | POA: Diagnosis present

## 2023-04-08 DIAGNOSIS — K648 Other hemorrhoids: Secondary | ICD-10-CM | POA: Diagnosis not present

## 2023-04-08 DIAGNOSIS — Z1152 Encounter for screening for COVID-19: Secondary | ICD-10-CM | POA: Diagnosis not present

## 2023-04-08 DIAGNOSIS — Z8572 Personal history of non-Hodgkin lymphomas: Secondary | ICD-10-CM

## 2023-04-08 DIAGNOSIS — H409 Unspecified glaucoma: Secondary | ICD-10-CM | POA: Diagnosis present

## 2023-04-08 DIAGNOSIS — R059 Cough, unspecified: Secondary | ICD-10-CM | POA: Diagnosis not present

## 2023-04-08 DIAGNOSIS — J449 Chronic obstructive pulmonary disease, unspecified: Secondary | ICD-10-CM | POA: Diagnosis present

## 2023-04-08 DIAGNOSIS — Z8639 Personal history of other endocrine, nutritional and metabolic disease: Secondary | ICD-10-CM

## 2023-04-08 DIAGNOSIS — R918 Other nonspecific abnormal finding of lung field: Secondary | ICD-10-CM | POA: Diagnosis not present

## 2023-04-08 DIAGNOSIS — F32A Depression, unspecified: Secondary | ICD-10-CM | POA: Diagnosis present

## 2023-04-08 DIAGNOSIS — M545 Low back pain, unspecified: Secondary | ICD-10-CM | POA: Diagnosis present

## 2023-04-08 DIAGNOSIS — E785 Hyperlipidemia, unspecified: Secondary | ICD-10-CM | POA: Diagnosis present

## 2023-04-08 DIAGNOSIS — Z885 Allergy status to narcotic agent status: Secondary | ICD-10-CM

## 2023-04-08 LAB — CBC WITH DIFFERENTIAL/PLATELET
Abs Immature Granulocytes: 0.01 10*3/uL (ref 0.00–0.07)
Basophils Absolute: 0 10*3/uL (ref 0.0–0.1)
Basophils Relative: 0 %
Eosinophils Absolute: 0.1 10*3/uL (ref 0.0–0.5)
Eosinophils Relative: 1 %
HCT: 29.9 % — ABNORMAL LOW (ref 36.0–46.0)
Hemoglobin: 8.8 g/dL — ABNORMAL LOW (ref 12.0–15.0)
Immature Granulocytes: 0 %
Lymphocytes Relative: 30 %
Lymphs Abs: 2.2 10*3/uL (ref 0.7–4.0)
MCH: 23.9 pg — ABNORMAL LOW (ref 26.0–34.0)
MCHC: 29.4 g/dL — ABNORMAL LOW (ref 30.0–36.0)
MCV: 81.3 fL (ref 80.0–100.0)
Monocytes Absolute: 0.9 10*3/uL (ref 0.1–1.0)
Monocytes Relative: 12 %
Neutro Abs: 4.2 10*3/uL (ref 1.7–7.7)
Neutrophils Relative %: 57 %
Platelets: 214 10*3/uL (ref 150–400)
RBC: 3.68 MIL/uL — ABNORMAL LOW (ref 3.87–5.11)
RDW: 15.8 % — ABNORMAL HIGH (ref 11.5–15.5)
WBC: 7.4 10*3/uL (ref 4.0–10.5)
nRBC: 0 % (ref 0.0–0.2)

## 2023-04-08 LAB — COMPREHENSIVE METABOLIC PANEL
ALT: 11 U/L (ref 0–44)
AST: 18 U/L (ref 15–41)
Albumin: 2.9 g/dL — ABNORMAL LOW (ref 3.5–5.0)
Alkaline Phosphatase: 54 U/L (ref 38–126)
Anion gap: 6 (ref 5–15)
BUN: 10 mg/dL (ref 8–23)
CO2: 28 mmol/L (ref 22–32)
Calcium: 7.9 mg/dL — ABNORMAL LOW (ref 8.9–10.3)
Chloride: 104 mmol/L (ref 98–111)
Creatinine, Ser: 0.72 mg/dL (ref 0.44–1.00)
GFR, Estimated: >60 mL/min (ref >60)
Glucose, Bld: 93 mg/dL (ref 70–99)
Potassium: 3.5 mmol/L (ref 3.5–5.1)
Sodium: 138 mmol/L (ref 135–145)
Total Bilirubin: 0.4 mg/dL (ref 0.0–1.2)
Total Protein: 6.2 g/dL — ABNORMAL LOW (ref 6.5–8.1)

## 2023-04-08 LAB — RESP PANEL BY RT-PCR (RSV, FLU A&B, COVID)  RVPGX2
Influenza A by PCR: NEGATIVE
Influenza B by PCR: NEGATIVE
Resp Syncytial Virus by PCR: NEGATIVE
SARS Coronavirus 2 by RT PCR: NEGATIVE

## 2023-04-08 LAB — TSH: TSH: 2.764 u[IU]/mL (ref 0.350–4.500)

## 2023-04-08 LAB — POC OCCULT BLOOD, ED: Fecal Occult Bld: POSITIVE — AB

## 2023-04-08 LAB — CBG MONITORING, ED: Glucose-Capillary: 84 mg/dL (ref 70–99)

## 2023-04-08 MED ORDER — CHOLECALCIFEROL 1.25 MG (50000 UT) PO TABS: ORAL_TABLET | ORAL | 0 refills | Status: DC

## 2023-04-08 MED ORDER — SODIUM CHLORIDE 0.9 % IV SOLN: 250.0000 mL | INTRAVENOUS | Status: AC | PRN

## 2023-04-08 MED ORDER — SODIUM CHLORIDE 0.9% FLUSH: 3.0000 mL | INTRAVENOUS | Status: DC | PRN

## 2023-04-08 MED ORDER — ACETAMINOPHEN 650 MG RE SUPP: 650.0000 mg | Freq: Four times a day (QID) | RECTAL | Status: DC | PRN

## 2023-04-08 MED ORDER — ALPRAZOLAM 0.25 MG PO TABS: 0.2500 mg | ORAL_TABLET | Freq: Every evening | ORAL | Status: DC | PRN

## 2023-04-08 MED ORDER — NITROGLYCERIN 0.4 MG SL SUBL: 0.4000 mg | SUBLINGUAL_TABLET | SUBLINGUAL | Status: DC | PRN

## 2023-04-08 MED ORDER — AMLODIPINE BESYLATE 5 MG PO TABS
2.5000 mg | ORAL_TABLET | Freq: Every day | ORAL | Status: DC
  Administered 2023-04-09 – 2023-04-10 (×2): 2.5 mg via ORAL
  Filled 2023-04-08 (×2): qty 1

## 2023-04-08 MED ORDER — SODIUM CHLORIDE 0.9 % IV SOLN
1.0000 g | INTRAVENOUS | Status: DC
  Administered 2023-04-09 – 2023-04-10 (×2): 1 g via INTRAVENOUS
  Filled 2023-04-08 (×2): qty 10

## 2023-04-08 MED ORDER — ONDANSETRON HCL 4 MG/2ML IJ SOLN: 4.0000 mg | Freq: Four times a day (QID) | INTRAMUSCULAR | Status: DC | PRN

## 2023-04-08 MED ORDER — METOPROLOL SUCCINATE ER 50 MG PO TB24
50.0000 mg | ORAL_TABLET | Freq: Every day | ORAL | Status: DC
  Administered 2023-04-09 – 2023-04-10 (×2): 50 mg via ORAL
  Filled 2023-04-08 (×2): qty 1

## 2023-04-08 MED ORDER — AMIODARONE HCL 200 MG PO TABS
100.0000 mg | ORAL_TABLET | Freq: Every day | ORAL | Status: DC
  Administered 2023-04-09 – 2023-04-10 (×2): 100 mg via ORAL
  Filled 2023-04-08 (×2): qty 1

## 2023-04-08 MED ORDER — ACETAMINOPHEN 325 MG PO TABS: 650.0000 mg | ORAL_TABLET | Freq: Four times a day (QID) | ORAL | Status: DC | PRN

## 2023-04-08 MED ORDER — SODIUM CHLORIDE 0.9 % IV SOLN: INTRAVENOUS | Status: AC

## 2023-04-08 MED ORDER — PAROXETINE HCL 10 MG PO TABS
10.0000 mg | ORAL_TABLET | Freq: Every day | ORAL | Status: DC
  Administered 2023-04-09 – 2023-04-10 (×2): 10 mg via ORAL
  Filled 2023-04-08 (×2): qty 1

## 2023-04-08 MED ORDER — IPRATROPIUM-ALBUTEROL 0.5-2.5 (3) MG/3ML IN SOLN: 3.0000 mL | RESPIRATORY_TRACT | Status: DC | PRN

## 2023-04-08 MED ORDER — ENOXAPARIN SODIUM 40 MG/0.4ML IJ SOSY: 40.0000 mg | PREFILLED_SYRINGE | INTRAMUSCULAR | Status: DC

## 2023-04-08 MED ORDER — SODIUM CHLORIDE 0.9 % IV SOLN
500.0000 mg | INTRAVENOUS | Status: DC
  Administered 2023-04-09: 500 mg via INTRAVENOUS
  Filled 2023-04-08 (×2): qty 5

## 2023-04-08 MED ORDER — ATORVASTATIN CALCIUM 40 MG PO TABS
40.0000 mg | ORAL_TABLET | Freq: Every day | ORAL | Status: DC
  Administered 2023-04-09 – 2023-04-10 (×2): 40 mg via ORAL
  Filled 2023-04-08 (×2): qty 1

## 2023-04-08 MED ORDER — CEFTRIAXONE SODIUM 1 G IJ SOLR
1.0000 g | Freq: Once | INTRAMUSCULAR | Status: AC
  Administered 2023-04-08: 1 g via INTRAVENOUS
  Filled 2023-04-08: qty 10

## 2023-04-08 MED ORDER — SODIUM CHLORIDE 0.9% FLUSH
3.0000 mL | Freq: Two times a day (BID) | INTRAVENOUS | Status: DC
  Administered 2023-04-08 – 2023-04-10 (×4): 3 mL via INTRAVENOUS

## 2023-04-08 MED ORDER — SODIUM CHLORIDE 0.9 % IV SOLN
500.0000 mg | Freq: Once | INTRAVENOUS | Status: AC
  Administered 2023-04-08: 500 mg via INTRAVENOUS
  Filled 2023-04-08: qty 5

## 2023-04-08 MED ORDER — HYDRALAZINE HCL 20 MG/ML IJ SOLN: 5.0000 mg | INTRAMUSCULAR | Status: DC | PRN

## 2023-04-08 MED ORDER — PANTOPRAZOLE SODIUM 40 MG IV SOLR
40.0000 mg | Freq: Once | INTRAVENOUS | Status: AC
  Administered 2023-04-08: 40 mg via INTRAVENOUS
  Filled 2023-04-08: qty 10

## 2023-04-08 MED ORDER — GABAPENTIN 100 MG PO CAPS
200.0000 mg | ORAL_CAPSULE | Freq: Two times a day (BID) | ORAL | Status: DC
  Administered 2023-04-08 – 2023-04-10 (×4): 200 mg via ORAL
  Filled 2023-04-08 (×4): qty 2

## 2023-04-08 MED ORDER — ONDANSETRON HCL 4 MG PO TABS: 4.0000 mg | ORAL_TABLET | Freq: Four times a day (QID) | ORAL | Status: DC | PRN

## 2023-04-08 NOTE — ED Provider Notes (Addendum)
[place] EMERGENCY DEPARTMENT AT [facility] Provider Note   CSN: [id number] Arrival date & time: 04/08/23  1131     History  Chief Complaint  Patient presents with   Dizziness   Rectal Bleeding    Samantha [name] is a 83 y.o. female with history of anemia, hypertension, CAD, non-Hodgkin's lymphoma, A-fib on Eliquis, COPD, chronic back pain, who presents the emergency department complaining of chest tightness, cough, fever, loose stools, and blood per rectum.  Patient states that she has been having dark and loose stools for quite some time, is been following up with her PCP about it.  This morning she had an episode of bright red blood per rectum.  States that she normally wears depends and felt some leakage, and when she went to the bathroom and there was bright red blood with clot, she did have subsequent small bowel movement.  No pain in her abdomen or rectum during that.  She also describes this chest tightness and productive cough for the past 4 to 5 days.  She is not on chronic oxygen.  Patient is a DNR.    Dizziness Associated symptoms: blood in stool, diarrhea, shortness of breath and weakness   Associated symptoms: no nausea and no vomiting   Rectal Bleeding Associated symptoms: dizziness, fever and light-headedness   Associated symptoms: no abdominal pain and no vomiting        Home Medications Prior to Admission medications   Medication Sig Start Date End Date Taking? Authorizing Provider  ALPRAZolam Prudy Feeler) 0.25 MG tablet TAKE 1 TABLET EVERY DAY AS NEEDED FOR ANXIETY 08/14/22   Allegra Grana, FNP  amiodarone (PACERONE) 200 MG tablet TAKE 1/2 TABLET EVERY MORNING 12/10/22   Antonieta Iba, MD  amLODipine (NORVASC) 2.5 MG tablet Take 1 tablet (2.5 mg total) by mouth daily. 07/06/22   Allegra Grana, FNP  apixaban (ELIQUIS) 5 MG TABS tablet Take 1 tablet (5 mg total) by mouth 2 (two) times daily. 12/10/22   Antonieta Iba, MD  atorvastatin  (LIPITOR) 40 MG tablet Take 1 tablet (40 mg total) by mouth daily. *NEED OFFICE VISIT* TAKE 1 TABLET EVERY DAY 04/04/23   Allegra Grana, FNP  Cholecalciferol 1.25 MG (50000 UT) TABS 50,000 units PO qwk for 8 weeks. Patient not taking: Reported on 04/08/2023 04/08/23   Allegra Grana, FNP  gabapentin (NEURONTIN) 100 MG capsule Take 2 capsules (200 mg total) by mouth 2 (two) times daily. 04/04/23   Allegra Grana, FNP  metoprolol succinate (TOPROL-XL) 50 MG 24 hr tablet Take 1 tablet (50 mg total) by mouth daily. 04/04/23   Allegra Grana, FNP  nitroGLYCERIN (NITROSTAT) 0.4 MG SL tablet Place 1 tablet (0.4 mg total) under the tongue every 5 (five) minutes x 3 doses as needed for chest pain. 10/12/21   Antonieta Iba, MD  OVER THE COUNTER MEDICATION Energy beet gummy    [provider]  PARoxetine (PAXIL) 10 MG tablet Take 1 tablet (10 mg total) by mouth daily. 02/13/23   Allegra Grana, FNP      Allergies    Hydrocodone and Oxycodone    Review of Systems   Review of Systems  Constitutional:  Positive for fever.  Respiratory:  Positive for cough, chest tightness, shortness of breath and wheezing.   Gastrointestinal:  Positive for blood in stool, diarrhea and hematochezia. Negative for abdominal pain, nausea and vomiting.  Neurological:  Positive for dizziness, weakness and light-headedness.  All  other systems reviewed and are negative.   Physical Exam Updated Vital Signs BP (!) 140/79 (BP Location: Right Arm)   Pulse (!) 51   Temp 99.2 F (37.3 C) (Oral)   Resp 20   Ht 5\' 9"  (1.753 m)   Wt 87.5 kg   SpO2 100%   BMI 28.50 kg/m  Physical Exam Vitals and nursing note reviewed. Exam conducted with a chaperone present.  Constitutional:      Appearance: Normal appearance.  HENT:     Head: Normocephalic and atraumatic.  Eyes:     Conjunctiva/sclera: Conjunctivae normal.  Cardiovascular:     Rate and Rhythm: Normal rate and regular rhythm.  Pulmonary:      Effort: Pulmonary effort is normal. No respiratory distress.     Breath sounds: Normal breath sounds.     Comments: On 2 L Intercourse Abdominal:     General: There is no distension.     Palpations: Abdomen is soft.     Tenderness: There is no abdominal tenderness.  Genitourinary:    Comments: No melena on rectal exam Skin:    General: Skin is warm and dry.     Coloration: Skin is pale.  Neurological:     General: No focal deficit present.     Mental Status: She is alert.     ED Results / Procedures / Treatments   Labs (all labs ordered are listed, but only abnormal results are displayed) Labs Reviewed  COMPREHENSIVE METABOLIC PANEL - Abnormal; Notable for the following components:      Result Value   Calcium 7.9 (*)    Total Protein 6.2 (*)    Albumin 2.9 (*)    All other components within normal limits  CBC WITH DIFFERENTIAL/PLATELET - Abnormal; Notable for the following components:   RBC 3.68 (*)    Hemoglobin 8.8 (*)    HCT 29.9 (*)    MCH 23.9 (*)    MCHC 29.4 (*)    RDW 15.8 (*)    All other components within normal limits  POC OCCULT BLOOD, ED - Abnormal; Notable for the following components:   Fecal Occult Bld POSITIVE (*)    All other components within normal limits  RESP PANEL BY RT-PCR (RSV, FLU A&B, COVID)  RVPGX2  CBG MONITORING, ED    EKG None  Radiology DG Chest 2 View Result Date: 04/08/2023 CLINICAL DATA:  Shortness of breath and cough EXAM: CHEST - 2 VIEW COMPARISON:  CT chest dated 10/30/2022, chest radiograph dated 09/19/2015 FINDINGS: Normal lung volumes. Multifocal bilateral lower lung predominant patchy opacities, left-greater-than-right. No pleural effusion or pneumothorax. The heart size and mediastinal contours are within normal limits. No acute osseous abnormality. Partially imaged intrathecal lead projects over the midthoracic spine. IMPRESSION: Multifocal bilateral lower lung predominant patchy opacities, left-greater-than-right, suspicious for  multifocal pneumonia. Electronically Signed   By: Agustin Cree M.D.   On: 04/08/2023 14:58    Procedures .Critical Care  Performed by: Su Monks, PA-C Authorized by: Su Monks, PA-C   Critical care provider statement:    Critical care time (minutes):  30   Critical care time was exclusive of:  Separately billable procedures and treating other patients   Critical care was necessary to treat or prevent imminent or life-threatening deterioration of the following conditions:  Respiratory failure   Critical care was time spent personally by me on the following activities:  Development of treatment plan with patient or surrogate, discussions with consultants, evaluation of patient's response  to treatment, examination of patient, ordering and review of laboratory studies, ordering and review of radiographic studies, ordering and performing treatments and interventions, pulse oximetry, re-evaluation of patient's condition and review of old charts   I assumed direction of critical care for this patient from another provider in my specialty: no     Care discussed with: admitting provider       Medications Ordered in ED Medications  cefTRIAXone (ROCEPHIN) 1 g in sodium chloride 0.9 % 100 mL IVPB (1 g Intravenous New Bag/Given 04/08/23 1523)  azithromycin (ZITHROMAX) 500 mg in sodium chloride 0.9 % 250 mL IVPB (has no administration in time range)  pantoprazole (PROTONIX) injection 40 mg (40 mg Intravenous Given 04/08/23 1523)    ED Course/ Medical Decision Making/ A&P Clinical Course as of 04/08/23 1536  Mon Apr 08, 2023  1315 SpO2 88% on room air [LR]  1320 SpO2 96% on 2 L  [LR]    Clinical Course User Index [LR] Jozette Castrellon, Lora Paula, PA-C                                 Medical Decision Making Amount and/or Complexity of Data Reviewed Labs: ordered. Radiology: ordered.  Risk Prescription drug management. Decision regarding hospitalization.  This patient is a 83 y.o.  female  who presents to the ED for concern of cough, shortness of breath, blood per rectum.   Differential diagnoses prior to evaluation: The emergent differential diagnosis includes, but is not limited to,  CHF, pericardial effusion/tamponade, arrhythmias, ACS, COPD, asthma, bronchitis, pneumonia, pneumothorax, PE, anemia, Diverticulitis, IBD, colitis, mesenteric ischemia, colorectal cancer / polyps, hemorrhoids, rectal foreign body, anal fissure. This is not an exhaustive differential.   Past Medical History / Co-morbidities / Social History: anemia, hypertension, CAD, non-Hodgkin's lymphoma, A-fib on Eliquis, COPD, chronic back pain  Additional history: Chart reviewed. Pertinent results include: Reviewed PCP note from 1/16 and follow-up telephone conversation regarding patient's lab results.  They demonstrated worsening anemia, with hemoglobin 9.5.  Physical Exam: Physical exam performed. The pertinent findings include: Oxygen saturation 88% on room air, placed on 2 L nasal cannula, improved to 96.  Otherwise vital signs normal, patient in no acute distress.  Heart regular rate and rhythm, lung sounds clear. Rectal exam performed with chaperone, no melena.   Lab Tests/Imaging studies: I personally interpreted labs/imaging and the pertinent results include: No leukocytosis, hemoglobin 8.8, compared to 9.5 four days ago. CMP with calcium 7.9, albumin 2.9. Respiratory panel negative.   Chest x-ray with multifocal bilateral lower lung predominant patchy opacities, left greater than right, suspicious for multifocal pneumonia. I agree with the radiologist interpretation.  Cardiac monitoring: EKG obtained and interpreted by myself and attending physician which shows: sinus rhythm, nonspecific abnormal T waves   Medications: I ordered medication including antibiotics for CAP, protonix.  I have reviewed the patients home medicines and have made adjustments as needed.  Consultations obtained: I  consulted with gastroenterologist Dr Levora Angel who will consult while admitted.   I consulted with hospitalist Dr Tyson Babinski who will admit.    Disposition: After consideration of the diagnostic results and the patients response to treatment, I feel that patient is requiring admission for further evaluation and treatment of new hypoxia with multifocal pneumonia, worsening chronic anemia with positive hemoccult.  Final Clinical Impression(s) / ED Diagnoses Final diagnoses:  Hypoxia  Stool guaiac positive  Multifocal pneumonia    Rx / DC Orders ED Discharge  Orders     None      Portions of this report may have been transcribed using voice recognition software. Every effort was made to ensure accuracy; however, inadvertent computerized transcription errors may be present.    Jeanella Flattery 04/08/23 1536    Ernie Avena, MD 04/08/23 1643  ADDENDUM 04/15/2023 -- Critical care time added.    Jeanella Flattery 04/15/23 [id number]    Ernie Avena, MD 04/15/23 [phone]

## 2023-04-08 NOTE — Telephone Encounter (Signed)
Chief Complaint: black stools with blood Symptoms: Dark stools, bright blood at times - also cold with chest congestion Frequency: 5 days Pertinent Negatives: Patient denies usage vomiting Disposition: [x] ED /[] Urgent Care (no appt availability in office) / [] Appointment(In office/virtual)/ []  [facility]/ [] Home Care/ [] Refused Recommended Disposition /[] [facility]/ []  Follow-up with PCP  Additional Notes: Patient called in stating she has been having black stools with blood for roughly 4-5 days. Patient states her most recent bowel movement had bright red blood. Patient denies use of pepto bismol and any iron supplements. Patient states she has been taking imodium for diarrhea. Patient states she has been having diarrhea for a few months and provider is mostly aware. Patient confirms she is on Eliquis at this time. Patient states she has intermittent abdominal pain and has been feeling lightheaded over the last few days. Patient also states she has a cold she has been fighting that she feels is in her chest and head. Advised patient to go to the Emergency Department for evaluation asap. Patient verbalized understanding and agreement.     Copied from CRM [id number]. Topic: Clinical - Red Word Triage >> Apr 08, 2023 10:14 AM Thomes Dinning wrote: Red Word that prompted transfer to Nurse Triage: Patient has been passing blood in her stools and they are black. Reason for Disposition  Taking Coumadin (warfarin) or other strong blood thinner, or known bleeding disorder (e.g., thrombocytopenia)  Answer Assessment - Initial Assessment Questions 1. APPEARANCE of BLOOD: "What color is it?" "Is it passed separately, on the surface of the stool, or mixed in with the stool?"      Red, with a little black in that could've been a blood clot, bright red blood 3. FREQUENCY: "How many times has blood been passed with the stools?"      4 or 5 days 4. ONSET: "When was the blood first seen in the  stools?" (Days or weeks)      4 or 5 days ago 5. DIARRHEA: "Is there also some diarrhea?" If Yes, ask: "How many diarrhea stools in the past 24 hours?"      Been having diarrhea every day for months 7. RECURRENT SYMPTOMS: "Have you had blood in your stools before?" If Yes, ask: "When was the last time?" and "What happened that time?"      No 8. BLOOD THINNERS: "Do you take any blood thinners?" (e.g., Coumadin/warfarin, Pradaxa/dabigatran, aspirin)     Eliquis 9. OTHER SYMPTOMS: "Do you have any other symptoms?"  (e.g., abdomen pain, vomiting, dizziness, fever)     Intermittent, lightheaded for a few days, possible fever  Protocols used: Rectal Bleeding-A-AH

## 2023-04-08 NOTE — ED Triage Notes (Addendum)
Patient in ED by POV with c/o cold symptoms since Thursday and noticed that she has been passing bright red blood in stool since this morning. She c/o chest tightness, cough, fever, and diarrhea.

## 2023-04-08 NOTE — Telephone Encounter (Signed)
 She is in the ed

## 2023-04-08 NOTE — Telephone Encounter (Signed)
Left message to call the office back regarding the lab results below. Okay to give lab results.

## 2023-04-08 NOTE — H&P (Addendum)
[facility] History and Physical  Samantha [name]:[id number] DOB: Oct 08, 1940 DOA: 04/08/2023  Referring physician: ED  PCP: Allegra Grana, FNP   Patient is coming from: Home  Chief Complaint: Coug,  congestion, GI bleed  HPI:  Patient is 83 years old female with past medical history of non-Hodgkin's lymphoma, atrial fibrillation on Eliquis, COPD, chronic back pain, anemia, hypertension presented to the hospital with cough,congestion with productive sputum whitish in color with subjective fever and chest tightness for  3 to 4 days.  Patient also reported having having some dark loose stools for some time now.  Her PCP is aware of dark stools and they were closely watching it.  Again this morning, patient had an episode of bright red blood per rectum, minimal in amount though.  Denied any abdominal pain, nausea, vomiting.  Denies any urinary urgency, frequency or dysuria.  Denies any syncope.  Patient however complains of fatigue weakness and is not feeling well.  Has been eating okay.  Has been having diarrhea which is not new for her.  Patient states that she is able to ambulate on her own at home and lives by herself.  In the ED, patient was noted to be pale.  CBC showed WBC at 7.4 with hemoglobin of 8.8.  COVID influenza and RSV was negative.  Chemistry was notable for albumin low at 2.9.  Fecal occult blood was positive.  Chest x-ray done in the ED showed multifocal bilateral lower lung predominant patchy opacities more on the left than the right suspicious for multifocal pneumonia.  Patient received Rocephin, Zithromax Protonix x 1 and was considered for admission to hospital for further evaluation and treatment.   Assessment and Plan Principal Problem:   Multifocal pneumonia Active Problems:   HTN (hypertension)   Anxiety and depression   Chronic low back pain   HLD (hyperlipidemia)   SOB (shortness of breath)   GI bleed  Acute hypoxic respiratory failure secondary to  multi focal pneumonia.   Continue Rocephin and Zithromax, oxygen supplementation.  Monitor pulse oximetry.  Wean oxygen as able.  Consider nebulizers, Follow blood cultures. Check urinary Legionella and streptococcal urinary antigen.  GI bleed.  [facility] was notified from the ED.  Might need endoscopic workup.  Had colonoscopy many years back.  On Eliquis at home, will hold..  Will follow recommendation.  Transfuse for hemoglobin less than 7 or ongoing bleed.  Keep n.p.o. after midnight.  Will put on full liquid today.  Paroxysmal atrial fibrillation   Will closely monitor in telemetry.  Continue amiodarone, metoprolol.  Hold Eliquis due to concern for GI bleed.  Essential hypertension.  Continue amlodipine, metoprolol.  Blood pressure seems to be stable at this time.  COPD.  Continue oxygen nebulizers with DuoNebs.  Add incentive spirometry.  Closely monitor.  No overt wheezing.  History of non-Hodgkin's lymphoma.  In remission at this time as per the patient..  Anxiety/ depression.  Continue with Xanax and paroxetine.  Generalized weakness debility.  Will get PT evaluation in AM.  Add TSH.   DVT Prophylaxis: SCD  Review of Systems:  All systems were reviewed and were negative unless otherwise mentioned in the HPI   Past Medical History:  Diagnosis Date   A-fib [facility])    Arthritis    Chronic back pain    COPD (chronic obstructive pulmonary disease) (HCC)    Coronary artery disease    COVID-19    08/22/20 likely contracted late 07/2020   Glaucoma  Hearing aid worn    High cholesterol    Hypertension    Lymphadenopathy 01/25/2016   Myocardial infarction (HCC)    2000   Non Hodgkin's lymphoma (HCC)    Wears dentures    " Top plate"   Wears glasses    Past Surgical History:  Procedure Laterality Date   CARDIAC CATHETERIZATION     CATARACT EXTRACTION W/ INTRAOCULAR LENS IMPLANT     CHOLECYSTECTOMY     COLONOSCOPY W/ BIOPSIES AND POLYPECTOMY     CORONARY STENT PLACEMENT      INGUINAL LYMPH NODE BIOPSY Right 01/25/2016   Procedure: EXCISION DEEP RIGHT INGUINAL LYMPH NODE BIOPSY WITH ULTRA SOUND;  Surgeon: Claud Kelp, MD;  Location: [facility] OR;  Service: General;  Laterality: Right;   SPINE SURGERY      Social History:  reports that she has been smoking cigarettes. She has a 30 pack-year smoking history. She has never used smokeless tobacco. She reports current alcohol use. She reports that she does not use drugs.  Allergies  Allergen Reactions   Hydrocodone Rash    Itching, rash   Oxycodone Rash    Family History  Problem Relation Age of Onset   Hypertension Mother    Stroke Mother    Heart disease Father    Heart disease Other      Prior to Admission medications   Medication Sig Start Date End Date Taking? Authorizing Provider  ALPRAZolam Prudy Feeler) 0.25 MG tablet TAKE 1 TABLET EVERY DAY AS NEEDED FOR ANXIETY 08/14/22  Yes Allegra Grana, FNP  amiodarone (PACERONE) 200 MG tablet TAKE 1/2 TABLET EVERY MORNING 12/10/22  Yes Gollan, Tollie Pizza, MD  amLODipine (NORVASC) 2.5 MG tablet Take 1 tablet (2.5 mg total) by mouth daily. 07/06/22  Yes Arnett, Lyn Records, FNP  apixaban (ELIQUIS) 5 MG TABS tablet Take 1 tablet (5 mg total) by mouth 2 (two) times daily. 12/10/22  Yes Antonieta Iba, MD  atorvastatin (LIPITOR) 40 MG tablet Take 1 tablet (40 mg total) by mouth daily. *NEED OFFICE VISIT* TAKE 1 TABLET EVERY DAY 04/04/23  Yes Arnett, Lyn Records, FNP  gabapentin (NEURONTIN) 100 MG capsule Take 2 capsules (200 mg total) by mouth 2 (two) times daily. 04/04/23  Yes Allegra Grana, FNP  metoprolol succinate (TOPROL-XL) 50 MG 24 hr tablet Take 1 tablet (50 mg total) by mouth daily. 04/04/23  Yes Arnett, Lyn Records, FNP  nitroGLYCERIN (NITROSTAT) 0.4 MG SL tablet Place 1 tablet (0.4 mg total) under the tongue every 5 (five) minutes x 3 doses as needed for chest pain. 10/12/21  Yes Gollan, Tollie Pizza, MD  PARoxetine (PAXIL) 10 MG tablet Take 1 tablet (10 mg total) by  mouth daily. 02/13/23  Yes Allegra Grana, FNP  Cholecalciferol 1.25 MG (50000 UT) TABS 50,000 units PO qwk for 8 weeks. Patient not taking: Reported on 04/08/2023 04/08/23   Allegra Grana, FNP  OVER THE COUNTER MEDICATION Energy beet gummy Patient not taking: Reported on 04/08/2023    [provider]    Physical Exam:  Vitals:   04/08/23 1319 04/08/23 1445 04/08/23 1459 04/08/23 1628  BP:  (!) 140/79 (!) 140/79 (!) 135/59  Pulse:  (!) 51 (!) 51 (!) 51  Resp:  18 20 18   Temp:   99.2 F (37.3 C) 98.3 F (36.8 C)  TempSrc:   Oral   SpO2: 96% 97% 100% 100%  Weight:      Height:       Wt Readings  from Last 3 Encounters:  04/08/23 87.5 kg  04/04/23 86.6 kg  01/02/23 88.8 kg   Body mass index is 28.5 kg/m.  General:  Average built,, elderly female, not in obvious distress, on nasal cannula oxygen HENT: Normocephalic, mild pallor noted.  Oral mucosa is moist.  Chest:  Clear breath sounds.  . No crackles or wheezes.  CVS: S1 &S2 heard. No murmur.  Regular rate and rhythm. Abdomen: Soft, nontender, nondistended.  Bowel sounds are heard. No abdominal mass palpated Extremities: No cyanosis, clubbing or edema.  Peripheral pulses are palpable. Psych: Alert, awake and oriented, normal mood CNS:  No cranial nerve deficits.  Power equal in all extremities.   Skin: Warm and dry.  No rashes noted.  Labs on Admission:   CBC: Recent Labs  Lab 04/04/23 1356 04/08/23 1219  WBC 8.7 7.4  NEUTROABS 5.2 4.2  HGB 9.5* 8.8*  HCT 31.5* 29.9*  MCV 78.1 81.3  PLT 255.0 214    Basic Metabolic Panel: Recent Labs  Lab 04/04/23 1356 04/08/23 1219  NA 142 138  K 4.4 3.5  CL 105 104  CO2 29 28  GLUCOSE 91 93  BUN 12 10  CREATININE 0.83 0.72  CALCIUM 8.6 7.9*    Liver Function Tests: Recent Labs  Lab 04/04/23 1356 04/08/23 1219  AST 16 18  ALT 8 11  ALKPHOS 60 54  BILITOT 0.3 0.4  PROT 6.5 6.2*  ALBUMIN 3.6 2.9*   No results for input(s): "LIPASE", "AMYLASE"  in the last 168 hours. No results for input(s): "AMMONIA" in the last 168 hours.  Cardiac Enzymes: No results for input(s): "CKTOTAL", "CKMB", "CKMBINDEX", "TROPONINI" in the last 168 hours.  BNP (last 3 results) No results for input(s): "BNP" in the last 8760 hours.  ProBNP (last 3 results) No results for input(s): "PROBNP" in the last 8760 hours.  CBG: Recent Labs  Lab 04/08/23 1136  GLUCAP 84    Lipase     Component Value Date/Time   LIPASE 32 09/19/2015 1404     Urinalysis    Component Value Date/Time   COLORURINE YELLOW 04/04/2023 1356   APPEARANCEUR Sl Cloud[place] (A) 04/04/2023 1356   LABSPEC 1.025 04/04/2023 1356   PHURINE 6.0 04/04/2023 1356   GLUCOSEU NEGATIVE 04/04/2023 1356   HGBUR NEGATIVE 04/04/2023 1356   BILIRUBINUR SMALL (A) 04/04/2023 1356   KETONESUR TRACE (A) 04/04/2023 1356   UROBILINOGEN 0.2 04/04/2023 1356   NITRITE NEGATIVE 04/04/2023 1356   LEUKOCYTESUR TRACE (A) 04/04/2023 1356     Drugs of Abuse  No results found for: "LABOPIA", "COCAINSCRNUR", "LABBENZ", "AMPHETMU", "THCU", "LABBARB"    Radiological Exams on Admission: DG Chest 2 View Result Date: 04/08/2023 CLINICAL DATA:  Shortness of breath and cough EXAM: CHEST - 2 VIEW COMPARISON:  CT chest dated 10/30/2022, chest radiograph dated 09/19/2015 FINDINGS: Normal lung volumes. Multifocal bilateral lower lung predominant patchy opacities, left-greater-than-right. No pleural effusion or pneumothorax. The heart size and mediastinal contours are within normal limits. No acute osseous abnormality. Partially imaged intrathecal lead projects over the midthoracic spine. IMPRESSION: Multifocal bilateral lower lung predominant patchy opacities, left-greater-than-right, suspicious for multifocal pneumonia. Electronically Signed   By: Agustin Cree M.D.   On: 04/08/2023 14:58    EKG: Personally reviewed by me which shows normal sinus rhythm   Consultant: [facility] notified from ED  Code Status:  DNR  Microbiology none  Antibiotics: Rocephin and Zithromax IV  Family Communication:  Patients' condition and plan of care including tests being ordered  have been discussed with the patient and the patient's son at bedside who indicate understanding and agree with the plan.   Status is: Inpatient  Severity of Illness: The appropriate patient status for this patient is INPATIENT. Inpatient status is judged to be reasonable and necessary in order to provide the required intensity of service to ensure the patient's safety. The patient's presenting symptoms, physical exam findings, and initial radiographic and laboratory data in the context of their chronic comorbidities is felt to place them at high risk for further clinical deterioration. Furthermore, it is not anticipated that the patient will be medically stable for discharge from the hospital within 2 midnights of admission.   * I certify that at the point of admission it is my clinical judgment that the patient will require inpatient hospital care spanning beyond 2 midnights from the point of admission due to high intensity of service, high risk for further deterioration and high frequency of surveillance required.*  Signed, Joycelyn Das, MD [facility] 04/08/2023

## 2023-04-08 NOTE — Telephone Encounter (Signed)
Samantha [name], CMA9 minutes ago (10:52 AM)    Copied from CRM [id number]. Topic: Clinical - Medical Advice >> Apr 08, 2023 10:47 AM Samantha [name] C wrote: Reason for CRM: patient is going to [facility] for her emergency, wanted clinical team to know

## 2023-04-08 NOTE — Telephone Encounter (Signed)
Copied from CRM [id number]. Topic: Clinical - Medical Advice >> Apr 08, 2023 10:47 AM Efraim Kaufmann C wrote: Reason for CRM: patient is going to [facility] for her emergency, wanted clinical team to know

## 2023-04-08 NOTE — Telephone Encounter (Signed)
-----   Message from Samantha [name] sent at 04/08/2023  7:51 AM EST ----- Call patient Review result note regarding anemia Schedule urine, serum in the next couple of weeks

## 2023-04-09 ENCOUNTER — Encounter (HOSPITAL_COMMUNITY): Payer: Self-pay | Admitting: Internal Medicine

## 2023-04-09 DIAGNOSIS — J189 Pneumonia, unspecified organism: Secondary | ICD-10-CM | POA: Diagnosis not present

## 2023-04-09 LAB — CBC
HCT: 28.6 % — ABNORMAL LOW (ref 36.0–46.0)
Hemoglobin: 8.3 g/dL — ABNORMAL LOW (ref 12.0–15.0)
MCH: 23.9 pg — ABNORMAL LOW (ref 26.0–34.0)
MCHC: 29 g/dL — ABNORMAL LOW (ref 30.0–36.0)
MCV: 82.4 fL (ref 80.0–100.0)
Platelets: 196 10*3/uL (ref 150–400)
RBC: 3.47 MIL/uL — ABNORMAL LOW (ref 3.87–5.11)
RDW: 15.7 % — ABNORMAL HIGH (ref 11.5–15.5)
WBC: 5.8 10*3/uL (ref 4.0–10.5)
nRBC: 0 % (ref 0.0–0.2)

## 2023-04-09 LAB — STREP PNEUMONIAE URINARY ANTIGEN: Strep Pneumo Urinary Antigen: NEGATIVE

## 2023-04-09 LAB — BASIC METABOLIC PANEL
Anion gap: 5 (ref 5–15)
BUN: 9 mg/dL (ref 8–23)
CO2: 25 mmol/L (ref 22–32)
Calcium: 7.5 mg/dL — ABNORMAL LOW (ref 8.9–10.3)
Chloride: 109 mmol/L (ref 98–111)
Creatinine, Ser: 0.37 mg/dL — ABNORMAL LOW (ref 0.44–1.00)
GFR, Estimated: >60 mL/min (ref >60)
Glucose, Bld: 85 mg/dL (ref 70–99)
Potassium: 3.2 mmol/L — ABNORMAL LOW (ref 3.5–5.1)
Sodium: 139 mmol/L (ref 135–145)

## 2023-04-09 LAB — PROTIME-INR
INR: 1.3 — ABNORMAL HIGH (ref 0.8–1.2)
Prothrombin Time: 16 s — ABNORMAL HIGH (ref 11.4–15.2)

## 2023-04-09 LAB — MAGNESIUM: Magnesium: 1.9 mg/dL (ref 1.7–2.4)

## 2023-04-09 LAB — PHOSPHORUS: Phosphorus: 2.9 mg/dL (ref 2.5–4.6)

## 2023-04-09 MED ORDER — POTASSIUM CHLORIDE CRYS ER 20 MEQ PO TBCR
40.0000 meq | EXTENDED_RELEASE_TABLET | Freq: Once | ORAL | Status: AC
  Administered 2023-04-09: 40 meq via ORAL
  Filled 2023-04-09: qty 2

## 2023-04-09 NOTE — TOC CM/SW Note (Signed)
 [facility] Quality rating ???? Patient survey rating ????   [facility] Quality rating ????? Patient survey rating???   [facility] [phone] Quality rating???? Patient survey rating????   [facility] [phone] Quality rating???? Patient survey rating????   [facility] [phone] Quality rating ???? Patient survey rating???   [facility] Quality rating??? Patient survey rating???   [facility] [phone] Quality rating??? Patient survey rating ????   [facility] [phone] Quality rating ????   [facility] Quality rating ??? Patient survey rating ????   [facility] Quality rating ??? Patient survey rating???   [facility] Quality rating ????? Patient survey rating ???   [facility] [phone] Quality rating ????? Patient survey rating ????

## 2023-04-09 NOTE — Progress Notes (Signed)
Progress Note   Patient: Samantha [name]:[id number] DOB: November 23, 1940 DOA: 04/08/2023     1 DOS: the patient was seen and examined on 04/09/2023   Brief hospital course: 83yo with h/o NHL, afib on Eliquis, COPD, chronic back pain, and HTN who presented on 1/20 with fever and chest congestion.  Also with chronically dark stools and one episode of a small amount of BRBPR.  Hgb 8.8, heme positive.  CXR with multifocal PNA.  Treated with Ceftriaxone and azithromycin and she was also given Protonix.83yo with h/o NHL, afib on Eliquis, COPD, chronic back pain, and HTN who presented on 1/20 with fever and chest congestion.  Also with chronically dark stools and one episode of a small amount of BRBPR.  Hgb 8.8, heme positive.  CXR with multifocal PNA.  Treated with Ceftriaxone and azithromycin and she was also given Protonix.  Assessment and Plan:  Multifocal pneumonia  Patient is a smoker with COPD Worsening and continued respiratory symptoms of late but no frank SOB, just cough and chest congestion CXR with multifocal PNA Continue Rocephin and Zithromax Reported to have acute respiratory failure but no documented low O2 saturations Will attempt to wean [place] O2 Ambulatory pulse ox in AM   Lower GI bleed She reports 1 episode of frank blood with clot in her diaper No further episodes [facility] was notified from the ED On Eliquis at home, will hold Stable Hgb Transfuse for hemoglobin <7   Paroxysmal atrial fibrillation  Telemetry monitoring Rate controlled with amiodarone, metoprolol Hold Eliquis due to concern for GI bleed   Essential hypertension Continue amlodipine, metoprolol  HLD Continue atorvastatin   COPD Not on home medications or O2 Continue to monitor but currently without apparent exacerbation   History of non-Hodgkin's lymphoma Scans in 10/2022 with concern for progression although clinically doing ok so not started on treatment Due for f/u next motnh   Anxiety/  depression Continue with Xanax and paroxetine.   Generalized weakness debility PT/OT evaluations Continue gabapentin Patient is eager to get home  Tobacco dependence Encourage cessation - she reports "only" smoking about 5 cigs/day and does not think this is a problem This was discussed with the patient and should be reviewed on an ongoing basis.   Patch declined   Consultants: GI PT OT  Procedures: None  Antibiotics: Ceftriaxone 1/20- Azithromycin 1/20-  30 Day Unplanned Readmission Risk Score    Flowsheet Row ED to Hosp-Admission (Current) from 04/08/2023 in [facility]  30 Day Unplanned Readmission Risk Score (%) 18.45 Filed at 04/09/2023 0801       This score is the patient's risk of an unplanned readmission within 30 days of being discharged (0 -100%). The score is based on dignosis, age, lab data, medications, orders, and past utilization.   Low:  0-14.9   Medium: 15-21.9   High: 22-29.9   Extreme: 30 and above           Subjective: Wants to go home.  Feeling better.  Still on O2 and not on home O2   Objective: Vitals:   04/09/23 0603 04/09/23 1404  BP: (!) 135/50 (!) 123/59  Pulse: (!) 50 (!) 48  Resp: 18 20  Temp: 98.2 F (36.8 C) 98.6 F (37 C)  SpO2: 100% 100%    Intake/Output Summary (Last 24 hours) at 04/09/2023 1558 Last data filed at 04/09/2023 1511 Gross per 24 hour  Intake 1063.84 ml  Output --  Net 1063.84 ml  Filed Weights   04/08/23 1136 04/09/23 0500  Weight: 87.5 kg 87.5 kg    Exam:  General:  Appears calm and comfortable and is in NAD, on [place] O2 Eyes:  EOMI, normal lids, iris ENT:  grossly normal hearing, lips & tongue, mmm Neck:  no LAD, masses or thyromegaly Cardiovascular:  RRR, no m/r/g. No LE edema.  Respiratory:   Scattered rhonchi.  Normal respiratory effort. Abdomen:  soft, NT, ND Skin:  no rash or induration seen on limited exam Musculoskeletal:  grossly normal tone  BUE/BLE, good ROM, no bony abnormality Psychiatric:  grossly normal mood and affect, speech fluent and appropriate, AOx3 Neurologic:  CN 2-12 grossly intact, moves all extremities in coordinated fashion  Data Reviewed: I have reviewed the patient's lab results since admission.  Pertinent labs for today include:   K+ 3.2 Hgb 8.3, down from 9.5 on 1/16 INR 1.3 Normal TSH COVID/flu/RSV negative    Family Communication: Son was present throughout evaluation  Disposition: Status is: Inpatient Remains inpatient appropriate because: ongoing management     Time spent: 50 minutes  Unresulted Labs (From admission, onward)     Start     Ordered   04/10/23 0500  CBC with Differential/Platelet  Tomorrow morning,   R        04/09/23 1558   04/10/23 0500  Basic metabolic panel  Tomorrow morning,   R        04/09/23 1558   04/08/23 1652  Legionella Pneumophila Serogp 1 Ur Ag  Once,   R        04/08/23 1651             Author: Jonah Blue, MD 04/09/2023 3:58 PM  For on call review [email or web address].

## 2023-04-09 NOTE — Evaluation (Signed)
Physical Therapy Evaluation Patient Details Name: Samantha [name] MRN: [id number] DOB: 07-26-1940 Today's Date: 04/09/2023   SATURATION QUALIFICATIONS: (This note is used to comply with regulatory documentation for home oxygen)  Patient Saturations on Room Air at Rest = 88%  Patient Saturations on Room Air while Ambulating = n/a  Patient Saturations on 2 Liters of oxygen while Ambulating = 91%    History of Present Illness  83 yo female admitted with Pna, GI bleed. Hx of PLIF L4-L5 2020, COPD, CAd, MI, coronary stent placement, NHL, anemia  Clinical Impression  On eval, pt was CGA for mobility. She walked ~250 feet with her cane. Dyspnea 2/4 and fatigue after ~125 feet-standing rest break taken/needed. Pt reports she has been ambulating to/from bathroom, in her room, unassisted. Will continue to follow and progress activity as tolerated.       If plan is discharge home, recommend the following:     Can travel by private vehicle        Equipment Recommendations None recommended by PT  Recommendations for Other Services       Functional Status Assessment Patient has had a recent decline in their functional status and demonstrates the ability to make significant improvements in function in a reasonable and predictable amount of time.     Precautions / Restrictions Precautions Precautions: Fall Precaution Comments: monitor O2 Restrictions Weight Bearing Restrictions Per Provider Order: No      Mobility  Bed Mobility Overal bed mobility: Modified Independent                  Transfers Overall transfer level: Modified independent                      Ambulation/Gait Ambulation/Gait assistance: Contact guard assist Gait Distance (Feet): 250 Feet Assistive device: Straight cane Gait Pattern/deviations: Step-through pattern, Decreased stride length       General Gait Details: Intermittent mild unsteadiness but no LOB. Standing rest break after ~125  feet. O2 88% on 1L, 91% on 2L.  Stairs            Wheelchair Mobility     Tilt Bed    Modified Rankin (Stroke Patients Only)       Balance Overall balance assessment: Mild deficits observed, not formally tested                                           Pertinent Vitals/Pain Pain Assessment Pain Assessment: No/denies pain    Home Living Family/patient expects to be discharged to:: Private residence Living Arrangements: Alone Available Help at Discharge: Friend(s) Type of Home: Mobile home Home Access: Stairs to enter Entrance Stairs-Rails: Doctor, general practice of Steps: 3   Home Layout: One level Home Equipment: Cane - single point;Shower seat      Prior Function Prior Level of Function : Independent/Modified Independent                     Extremity/Trunk Assessment   Upper Extremity Assessment Upper Extremity Assessment: Overall WFL for tasks assessed    Lower Extremity Assessment Lower Extremity Assessment: Generalized weakness    Cervical / Trunk Assessment Cervical / Trunk Assessment: Normal  Communication   Communication Communication: No apparent difficulties  Cognition Arousal: Alert Behavior During Therapy: WFL for tasks assessed/performed Overall Cognitive Status: Within Functional Limits for tasks assessed  General Comments      Exercises     Assessment/Plan    PT Assessment Patient needs continued PT services  PT Problem List Decreased strength;Decreased activity tolerance;Decreased mobility;Decreased balance;Decreased knowledge of use of DME       PT Treatment Interventions DME instruction;Gait training;Functional mobility training;Therapeutic exercise;Therapeutic activities;Patient/family education;Balance training    PT Goals (Current goals can be found in the Care Plan section)  Acute Rehab PT Goals Patient Stated Goal:  home PT Goal Formulation: With patient Time For Goal Achievement: 04/23/23 Potential to Achieve Goals: Good    Frequency Min 1X/week     Co-evaluation               AM-PAC PT "6 Clicks" Mobility  Outcome Measure Help needed turning from your back to your side while in a flat bed without using bedrails?: A Little Help needed moving from lying on your back to sitting on the side of a flat bed without using bedrails?: A Little Help needed moving to and from a bed to a chair (including a wheelchair)?: A Little Help needed standing up from a chair using your arms (e.g., wheelchair or bedside chair)?: A Little Help needed to walk in hospital room?: A Little Help needed climbing 3-5 steps with a railing? : A Little 6 Click Score: 18    End of Session Equipment Utilized During Treatment: Gait belt;Oxygen Activity Tolerance: Patient tolerated treatment well;Patient limited by fatigue Patient left: in chair;with call bell/phone within reach   PT Visit Diagnosis: Difficulty in walking, not elsewhere classified (R26.2)    Time: 1610-9604 PT Time Calculation (min) (ACUTE ONLY): 20 min   Charges:   PT Evaluation $PT Eval Low Complexity: 1 Low   PT General Charges $$ ACUTE PT VISIT: 1 Visit           Faye Ramsay, PT Acute Rehabilitation  Office: [phone]

## 2023-04-09 NOTE — Plan of Care (Signed)

## 2023-04-09 NOTE — TOC Initial Note (Signed)
Transition of Care [facility]) - Initial/Assessment Note    Patient Details  Name: Samantha [name] MRN: [id number] Date of Birth: 05/09/1940  Transition of Care [facility]) CM/SW Contact:    Diona Browner, LCSW Phone Number: 04/09/2023, 2:56 PM  Clinical Narrative:                 Pt recommended for HHPT. CSW met with pt to review HHPT options. Pt agreeable to HHPT and services set up with [facility]. TOC to follow for O2 needs.   Expected Discharge Plan: Home w Home Health Services Barriers to Discharge: No Barriers Identified   Patient Goals and CMS Choice Patient states their goals for this hospitalization and ongoing recovery are:: return to baseline CMS Medicare.gov Compare Post Acute Care list provided to:: Patient Choice offered to / list presented to : Patient Julian ownership interest in [facility].provided to::  (N/A)    Expected Discharge Plan and Services In-house Referral: Clinical Social Work Discharge Planning Services: Other - See comment (Home health-PT)   Living arrangements for the past 2 months: Single Family Home                           HH Arranged: PT HH Agency: [facility] Date [facility] Agency Contacted: 04/09/23 Time HH Agency Contacted: [phone] Representative spoke with at [facility] Agency: Marylene Land  Prior Living Arrangements/Services Living arrangements for the past 2 months: Single Family Home Lives with:: Self Patient language and need for interpreter reviewed:: Yes Do you feel safe going back to the place where you live?: Yes      Need for Family Participation in Patient Care: No (Comment) Care giver support system in place?: No (comment) Current home services: Other (comment) (N/A) Criminal Activity/Legal Involvement Pertinent to Current Situation/Hospitalization: No - Comment as needed  Activities of Daily Living   ADL Screening (condition at time of admission) Independently performs ADLs?: Yes (appropriate for developmental  age) Is the patient deaf or have difficulty hearing?: No Does the patient have difficulty seeing, even when wearing glasses/contacts?: No Does the patient have difficulty concentrating, remembering, or making decisions?: No  Permission Sought/Granted                  Emotional Assessment Appearance:: Appears stated age Attitude/Demeanor/Rapport: Other (comment) (Cooperative) Affect (typically observed): Accepting Orientation: : Oriented to Self, Oriented to Place, Oriented to  Time, Oriented to Situation Alcohol / Substance Use: Tobacco Use Psych Involvement: No (comment)  Admission diagnosis:  Stool guaiac positive [R19.5] Hypoxia [R09.02] Multifocal pneumonia [J18.9] Patient Active Problem List   Diagnosis Date Noted   Multifocal pneumonia 04/08/2023   GI bleed 04/08/2023   Excessive sweating 10/30/2022   Left foot pain 07/06/2022   Lumbar post-laminectomy syndrome 03/08/2022   Radicular pain of left lower extremity 03/08/2022   Prediabetes 12/25/2021   COPD GOLD 2 / still smoking  12/13/2020   Cigarette smoker 12/13/2020   B12 deficiency 10/19/2020   Tinea pedis 10/19/2020   Mucus plugging of bronchi 09/02/2020   Pulmonary artery hypertension (HCC) 09/02/2020   Coronary artery disease involving native coronary artery of native heart without angina pectoris 09/02/2020   Abnormal CT scan of lung 09/02/2020   Ground glass opacity present on imaging of lung 09/01/2020   Atherosclerosis of aorta (HCC) 08/23/2020   SOB (shortness of breath) 08/22/2020   HLD (hyperlipidemia) 06/22/2020   Hoarseness 07/25/2018   Intermittent atrial fibrillation (HCC) 05/30/2018  Lumbar foraminal stenosis 04/25/2018   HTN (hypertension) 03/26/2018   Anxiety and depression 03/26/2018   Routine physical examination 03/26/2018   Chronic low back pain 03/26/2018   Follicular lymphoma of lymph nodes of multiple regions (HCC) 01/30/2016   DDD (degenerative disc disease), lumbar 05/31/2015    PCP:  Allegra Grana, FNP Pharmacy:   [facility] [id number] [place], [place] - [address] [place] [address] Phone: [phone] Fax: [phone]     Social Drivers of Health (SDOH) Social History: SDOH Screenings   Food Insecurity: No Food Insecurity (04/08/2023)  Housing: Low Risk  (04/08/2023)  Transportation Needs: No Transportation Needs (04/09/2023)  Utilities: Not At Risk (04/09/2023)  Alcohol Screen: Low Risk  (04/02/2023)  Depression (PHQ2-9): Low Risk  (04/04/2023)  Financial Resource Strain: Low Risk  (04/02/2023)  Physical Activity: Inactive (04/02/2023)  Social Connections: Socially Isolated (04/08/2023)  Stress: No Stress Concern Present (04/02/2023)  Tobacco Use: High Risk (04/04/2023)   SDOH Interventions:     Readmission Risk Interventions     No data to display

## 2023-04-10 DIAGNOSIS — D649 Anemia, unspecified: Secondary | ICD-10-CM

## 2023-04-10 DIAGNOSIS — J449 Chronic obstructive pulmonary disease, unspecified: Secondary | ICD-10-CM

## 2023-04-10 DIAGNOSIS — F17210 Nicotine dependence, cigarettes, uncomplicated: Secondary | ICD-10-CM

## 2023-04-10 DIAGNOSIS — K921 Melena: Secondary | ICD-10-CM | POA: Diagnosis not present

## 2023-04-10 DIAGNOSIS — K5730 Diverticulosis of large intestine without perforation or abscess without bleeding: Secondary | ICD-10-CM

## 2023-04-10 DIAGNOSIS — J189 Pneumonia, unspecified organism: Secondary | ICD-10-CM | POA: Diagnosis not present

## 2023-04-10 DIAGNOSIS — I10 Essential (primary) hypertension: Secondary | ICD-10-CM

## 2023-04-10 LAB — CBC WITH DIFFERENTIAL/PLATELET
Abs Immature Granulocytes: 0.02 10*3/uL (ref 0.00–0.07)
Basophils Absolute: 0 10*3/uL (ref 0.0–0.1)
Basophils Relative: 1 %
Eosinophils Absolute: 0.2 10*3/uL (ref 0.0–0.5)
Eosinophils Relative: 4 %
HCT: 29.4 % — ABNORMAL LOW (ref 36.0–46.0)
Hemoglobin: 8.4 g/dL — ABNORMAL LOW (ref 12.0–15.0)
Immature Granulocytes: 0 %
Lymphocytes Relative: 39 %
Lymphs Abs: 2.5 10*3/uL (ref 0.7–4.0)
MCH: 23.5 pg — ABNORMAL LOW (ref 26.0–34.0)
MCHC: 28.6 g/dL — ABNORMAL LOW (ref 30.0–36.0)
MCV: 82.1 fL (ref 80.0–100.0)
Monocytes Absolute: 0.7 10*3/uL (ref 0.1–1.0)
Monocytes Relative: 11 %
Neutro Abs: 2.9 10*3/uL (ref 1.7–7.7)
Neutrophils Relative %: 45 %
Platelets: 229 10*3/uL (ref 150–400)
RBC: 3.58 MIL/uL — ABNORMAL LOW (ref 3.87–5.11)
RDW: 15.6 % — ABNORMAL HIGH (ref 11.5–15.5)
WBC: 6.3 10*3/uL (ref 4.0–10.5)
nRBC: 0 % (ref 0.0–0.2)

## 2023-04-10 LAB — BASIC METABOLIC PANEL
Anion gap: 6 (ref 5–15)
BUN: 9 mg/dL (ref 8–23)
CO2: 26 mmol/L (ref 22–32)
Calcium: 7.9 mg/dL — ABNORMAL LOW (ref 8.9–10.3)
Chloride: 110 mmol/L (ref 98–111)
Creatinine, Ser: 0.56 mg/dL (ref 0.44–1.00)
GFR, Estimated: >60 mL/min (ref >60)
Glucose, Bld: 90 mg/dL (ref 70–99)
Potassium: 4 mmol/L (ref 3.5–5.1)
Sodium: 142 mmol/L (ref 135–145)

## 2023-04-10 MED ORDER — AMLODIPINE BESYLATE 5 MG PO TABS: 5.0000 mg | ORAL_TABLET | Freq: Every day | ORAL | Status: DC

## 2023-04-10 MED ORDER — METOPROLOL SUCCINATE ER 25 MG PO TB24: 25.0000 mg | ORAL_TABLET | Freq: Every day | ORAL | Status: DC

## 2023-04-10 MED ORDER — PANTOPRAZOLE SODIUM 40 MG PO TBEC: 40.0000 mg | DELAYED_RELEASE_TABLET | Freq: Every day | ORAL | 1 refills | Status: AC

## 2023-04-10 MED ORDER — PANTOPRAZOLE SODIUM 40 MG PO TBEC
40.0000 mg | DELAYED_RELEASE_TABLET | Freq: Every day | ORAL | Status: DC
Start: 2023-04-10 — End: 2023-04-10
  Administered 2023-04-10: 40 mg via ORAL
  Filled 2023-04-10: qty 1

## 2023-04-10 MED ORDER — AMOXICILLIN-POT CLAVULANATE 875-125 MG PO TABS: 1.0000 | ORAL_TABLET | Freq: Two times a day (BID) | ORAL | 0 refills | Status: AC

## 2023-04-10 NOTE — Progress Notes (Addendum)
Attending physician's note   I have taken a history, reviewed the chart, and examined the patient. I performed a substantive portion of this encounter, including complete performance of at least one of the key components, in conjunction with the APP. I agree with the APP's note, impression, and recommendations with my edits.  83 year old female with medical history as outlined below, admitted with pneumonia and reports a single episode of BRBPR on 1/20 with admission Hgb slightly below baseline (admitted 8.8 with baseline ~9.51-week prior).  Has had no bleeding since then and H/H relatively stable at 8.4/29.4 today.  Discussed potential etiology for bleeding, to include self-limiting diverticular bleed, hemorrhoidal bleed, AVMs, polyps, CRC, etc., with increased risk of bleeding in the context of chronic anticoagulation.  Patient very clear that she does not want a colonoscopy unless absolutely critical and for emergent, therapeutic intent.  Given overall hemodynamic stability, lack of rebleeding, and relatively stable H/H, feel that inpatient colonoscopy this juncture not critical.  Otherwise, she is very hopeful for discharge home today.  - Hard to know exactly when it is appropriate to restart anticoagulation since we do not know etiology of her recent bleed, but suspect ok to restart in the next 24 hours or so and monitor for overt rebleed - If overt rebleeding, to come back to the ER - Follow-up with PCM - Repeat CBC 7-10 days after hospital discharge to ensure returning to baseline.  Can be done through [facility] office - Can follow-up with her primary Gastroenterologist at [facility] - Antimicrobial management per primary Hospitalist service  [place], [place], [place] [phone] office         Referring Provider: Dr. Kathlen Mody Primary Care Physician:  Allegra Grana, FNP Primary Gastroenterologist: Dr. Farrel Gobble, [facility] PCP  Reason for Consultation: Rectal bleeding  HPI: Samantha [name] is a 83 y.o. female with a past medical history of arthritis, hypertension, coronary artery disease s/p MI 2000, atrial fibrillation on Eliquis, non-Hodgkin's lymphoma, anemia, COPD, chronic back pain and hyperplastic colon polyps.  She presented to the ED 04/08/2023 with cough, chest tightness, fever, loose stools with bright red blood per the rectum.  Labs in the ED showed a WBC count of 7.4.  Hemoglobin 8.8 down from hemoglobin 9.5 on 04/04/2023.  Hematocrit 29.9.  MCV 81.3.  Platelet 214.  BUN 10.  Creatinine 0.72.  Albumin 2.9.  Total bili 0.4.  Alk phos 54.  AST 18.  ALT 11.  TSH 2.764.  FOBT positive.  SARS coronavirus 2 negative.  Influenza A and B negative. RSV negative.  Labs 04/09/2023: Hemoglobin 8.3.  Hematocrit 28.6.  INR 1.3.  Platelet 196.  BUN 9.  Creatinine 0.37.  Potassium 3.2.  Labs today: Hemoglobin 8.4.  Hematocrit 29.4.  Platelet 229.  She endorses having chronic loose stools since she underwent treatment for non-Hodgkin's lymphoma in 2017.  She passes 1-2 loose stools daily, sometimes notices nonbloody mucus in her stool.  No abdominal or rectal pain.  She occasionally passes a solid stool.  She also noted passing black-colored stools once every few months which last occurred 1 day prior to presenting to the ED.  No Pepto-Bismol or oral iron use.  She developed a cough with shortness of breath and noticed a small to moderate amount of bright red blood mixed in her stool on 04/08/2023 without further recurrence.  No BM or blood per the rectum since admission.  No GERD symptoms or dysphagia.  History of A-fib on Eliquis.  She last took Eliquis at 9 AM on Monday 1/20.  No ASA or other NSAID use.  She smokes 4 to 5 cigarettes daily.  No alcohol use.  Her most recent colonoscopy was 01/19/2011 which identified 2 hyperplastic polyps removed from the rectosigmoid colon and internal hemorrhoids.  She does not wish to pursue a colonoscopy unless absolutely necessary.  GI  PROCEDURES:  Colonoscopy 01/19/2011 by Dr. Lynnae Prude at [facility] 01/22/2011: 2 diminutive hyperplastic polyps at the rectosigmoid colon.  Resected and retrieved. Internal hemorrhoids. The examination was otherwise normal.  Colonoscopy 11/25/2000: Two 4 mm hyperplastic polyps removed from the rectum   Past Medical History:  Diagnosis Date   A-fib [facility])    Arthritis    Chronic back pain    COPD (chronic obstructive pulmonary disease) (HCC)    Coronary artery disease    COVID-19    08/22/20 likely contracted late 07/2020   Glaucoma    Hearing aid worn    High cholesterol    Hypertension    Lymphadenopathy 01/25/2016   Myocardial infarction (HCC)    2000   Non Hodgkin's lymphoma (HCC)    Wears dentures    " Top plate"   Wears glasses     Past Surgical History:  Procedure Laterality Date   CARDIAC CATHETERIZATION     CATARACT EXTRACTION W/ INTRAOCULAR LENS IMPLANT     CHOLECYSTECTOMY     COLONOSCOPY W/ BIOPSIES AND POLYPECTOMY     CORONARY STENT PLACEMENT     INGUINAL LYMPH NODE BIOPSY Right 01/25/2016   Procedure: EXCISION DEEP RIGHT INGUINAL LYMPH NODE BIOPSY WITH ULTRA SOUND;  Surgeon: Claud Kelp, MD;  Location: [facility] OR;  Service: General;  Laterality: Right;   SPINE SURGERY      Prior to Admission medications   Medication Sig Start Date End Date Taking? Authorizing Provider  ALPRAZolam Prudy Feeler) 0.25 MG tablet TAKE 1 TABLET EVERY DAY AS NEEDED FOR ANXIETY 08/14/22  Yes Allegra Grana, FNP  amiodarone (PACERONE) 200 MG tablet TAKE 1/2 TABLET EVERY MORNING 12/10/22  Yes Gollan, Tollie Pizza, MD  amLODipine (NORVASC) 2.5 MG tablet Take 1 tablet (2.5 mg total) by mouth daily. 07/06/22  Yes Arnett, Lyn Records, FNP  apixaban (ELIQUIS) 5 MG TABS tablet Take 1 tablet (5 mg total) by mouth 2 (two) times daily. 12/10/22  Yes Antonieta Iba, MD  atorvastatin (LIPITOR) 40 MG tablet Take 1 tablet (40 mg total) by mouth daily. *NEED OFFICE VISIT* TAKE 1 TABLET EVERY DAY 04/04/23  Yes  Arnett, Lyn Records, FNP  gabapentin (NEURONTIN) 100 MG capsule Take 2 capsules (200 mg total) by mouth 2 (two) times daily. 04/04/23  Yes Allegra Grana, FNP  metoprolol succinate (TOPROL-XL) 50 MG 24 hr tablet Take 1 tablet (50 mg total) by mouth daily. 04/04/23  Yes Arnett, Lyn Records, FNP  nitroGLYCERIN (NITROSTAT) 0.4 MG SL tablet Place 1 tablet (0.4 mg total) under the tongue every 5 (five) minutes x 3 doses as needed for chest pain. 10/12/21  Yes Gollan, Tollie Pizza, MD  PARoxetine (PAXIL) 10 MG tablet Take 1 tablet (10 mg total) by mouth daily. 02/13/23  Yes Allegra Grana, FNP  Cholecalciferol 1.25 MG (50000 UT) TABS 50,000 units PO qwk for 8 weeks. Patient not taking: Reported on 04/08/2023 04/08/23   Allegra Grana, FNP  OVER THE COUNTER MEDICATION Energy beet gummy Patient not taking: Reported on 04/08/2023    [provider]    Current Facility-Administered Medications  Medication Dose Route Frequency  Provider Last Rate Last Admin   acetaminophen (TYLENOL) tablet 650 mg  650 mg Oral Q6H PRN Pokhrel, Laxman, MD       Or   acetaminophen (TYLENOL) suppository 650 mg  650 mg Rectal Q6H PRN Pokhrel, Laxman, MD       ALPRAZolam Prudy Feeler) tablet 0.25 mg  0.25 mg Oral QHS PRN Pokhrel, Laxman, MD       amiodarone (PACERONE) tablet 100 mg  100 mg Oral Daily Pokhrel, Laxman, MD   100 mg at 04/09/23 0919   amLODipine (NORVASC) tablet 2.5 mg  2.5 mg Oral Daily Pokhrel, Laxman, MD   2.5 mg at 04/09/23 6644   atorvastatin (LIPITOR) tablet 40 mg  40 mg Oral Daily Pokhrel, Laxman, MD   40 mg at 04/09/23 0347   azithromycin (ZITHROMAX) 500 mg in sodium chloride 0.9 % 250 mL IVPB  500 mg Intravenous Q24H Pokhrel, Laxman, MD 250 mL/hr at 04/09/23 2250 500 mg at 04/09/23 2250   cefTRIAXone (ROCEPHIN) 1 g in sodium chloride 0.9 % 100 mL IVPB  1 g Intravenous Q24H Pokhrel, Laxman, MD 200 mL/hr at 04/09/23 1511 Infusion Verify at 04/09/23 1511   gabapentin (NEURONTIN) capsule 200 mg  200 mg Oral  BID Pokhrel, Laxman, MD   200 mg at 04/09/23 2247   hydrALAZINE (APRESOLINE) injection 5 mg  5 mg Intravenous Q4H PRN Pokhrel, Laxman, MD       ipratropium-albuterol (DUONEB) 0.5-2.5 (3) MG/3ML nebulizer solution 3 mL  3 mL Nebulization Q3H PRN Pokhrel, Laxman, MD       metoprolol succinate (TOPROL-XL) 24 hr tablet 50 mg  50 mg Oral Daily Pokhrel, Laxman, MD   50 mg at 04/09/23 4259   nitroGLYCERIN (NITROSTAT) SL tablet 0.4 mg  0.4 mg Sublingual Q5 Min x 3 PRN Pokhrel, Laxman, MD       ondansetron (ZOFRAN) tablet 4 mg  4 mg Oral Q6H PRN Pokhrel, Laxman, MD       Or   ondansetron (ZOFRAN) injection 4 mg  4 mg Intravenous Q6H PRN Pokhrel, Laxman, MD       PARoxetine (PAXIL) tablet 10 mg  10 mg Oral Daily Pokhrel, Laxman, MD   10 mg at 04/09/23 0919   sodium chloride flush (NS) 0.9 % injection 3 mL  3 mL Intravenous Q12H Pokhrel, Laxman, MD   3 mL at 04/09/23 2247   sodium chloride flush (NS) 0.9 % injection 3 mL  3 mL Intravenous PRN Pokhrel, Laxman, MD        Allergies as of 04/08/2023 - Review Complete 04/08/2023  Allergen Reaction Noted   Hydrocodone Rash 08/05/2013   Oxycodone Rash 09/19/2015    Family History  Problem Relation Age of Onset   Hypertension Mother    Stroke Mother    Heart disease Father    Heart disease Other     Social History   Socioeconomic History   Marital status: Single    Spouse name: Not on file   Number of children: Not on file   Years of education: Not on file   Highest education level: GED or equivalent  Occupational History   Not on file  Tobacco Use   Smoking status: Every Day    Current packs/day: 0.50    Average packs/day: 0.5 packs/day for 60.0 years (30.0 ttl pk-yrs)    Types: Cigarettes   Smokeless tobacco: Never  Vaping Use   Vaping status: Never Used  Substance and Sexual Activity   Alcohol use: Yes  Comment: social   Drug use: No   Sexual activity: Not Currently  Other Topics Concern   Not on file  Social History Narrative    Not on file   Social Drivers of Health   Financial Resource Strain: Low Risk  (04/02/2023)   Overall Financial Resource Strain (CARDIA)    Difficulty of Paying Living Expenses: Not hard at all  Food Insecurity: No Food Insecurity (04/08/2023)   Hunger Vital Sign    Worried About Running Out of Food in the Last Year: Never true    Ran Out of Food in the Last Year: Never true  Transportation Needs: No Transportation Needs (04/09/2023)   PRAPARE - Administrator, Civil Service (Medical): No    Lack of Transportation (Non-Medical): No  Physical Activity: Inactive (04/02/2023)   Exercise Vital Sign    Days of Exercise per Week: 0 days    Minutes of Exercise per Session: 0 min  Stress: No Stress Concern Present (04/02/2023)   [employer] of Occupational Health - Occupational Stress Questionnaire    Feeling of Stress : Not at all  Social Connections: Socially Isolated (04/08/2023)   Social Connection and Isolation Panel [NHANES]    Frequency of Communication with Friends and Family: More than three times a week    Frequency of Social Gatherings with Friends and Family: More than three times a week    Attends Religious Services: Never    Database administrator or Organizations: No    Attends Banker Meetings: Never    Marital Status: Widowed  Intimate Partner Violence: Not At Risk (04/09/2023)   Humiliation, Afraid, Rape, and Kick questionnaire    Fear of Current or Ex-Partner: No    Emotionally Abused: No    Physically Abused: No    Sexually Abused: No   Review of Systems: Gen: Denies fever, sweats or chills. No weight loss.  CV: Denies chest pain, palpitations or edema. Resp: Denies cough, shortness of breath of hemoptysis.  GI: See HPI.    GU : Denies urinary burning, blood in urine, increased urinary frequency or incontinence. MS: Denies joint pain, muscles aches or weakness. Derm: Denies rash, itchiness, skin lesions or unhealing ulcers. Psych:  Denies depression, anxiety, memory loss or confusion. Heme: Denies easy bruising, bleeding. Neuro:  Denies headaches, dizziness or paresthesias. Endo:  Denies any problems with DM, thyroid or adrenal function.  Physical Exam: Vital signs in last 24 hours: Temp:  [98.2 F (36.8 C)-98.6 F (37 C)] 98.2 F (36.8 C) (01/22 0519) Pulse Rate:  [48-55] 52 (01/22 0519) Resp:  [20] 20 (01/21 1404) BP: (123-146)/(55-112) 140/112 (01/22 0519) SpO2:  [99 %-100 %] 100 % (01/22 0519) Last BM Date : 04/08/23 General: Alert 83 year old female sitting up in the chair, hard of hearing in no acute distress. Head:  Normocephalic and atraumatic. Eyes:  No scleral icterus. Conjunctiva pink. Ears:  Normal auditory acuity. Nose:  No deformity, discharge or lesions. Mouth: Upper dentures intact.  No ulcers or lesions.  Neck:  Supple. No lymphadenopathy or thyromegaly.  Lungs: Breath sounds clear throughout. No wheezes, rhonchi or crackles.  Heart: Regular rhythm, soft systolic murmur. Abdomen: Soft, nondistended.  Nontender.  Positive bowel sounds all 4 quadrants. Rectal: Deferred. Musculoskeletal:  Symmetrical without gross deformities.  Pulses:  Normal pulses noted. Extremities:  Without clubbing or edema. Neurologic:  Alert and  oriented x 4. No focal deficits.  Skin:  Intact without significant lesions or rashes. Psych:  Alert and  cooperative. Normal mood and affect.  Intake/Output from previous day: 01/21 0701 - 01/22 0700 In: 1195.8 [P.O.:750; I.V.:385; IV Piggyback:60.8] Out: -  Intake/Output this shift: No intake/output data recorded.  Lab Results: Recent Labs    04/08/23 1219 04/09/23 0521 04/10/23 0525  WBC 7.4 5.8 6.3  HGB 8.8* 8.3* 8.4*  HCT 29.9* 28.6* 29.4*  PLT 214 196 229   BMET Recent Labs    04/08/23 1219 04/09/23 0521 04/10/23 0525  NA 138 139 142  K 3.5 3.2* 4.0  CL 104 109 110  CO2 28 25 26   GLUCOSE 93 85 90  BUN 10 9 9   CREATININE 0.72 0.37* 0.56  CALCIUM  7.9* 7.5* 7.9*   LFT Recent Labs    04/08/23 1219  PROT 6.2*  ALBUMIN 2.9*  AST 18  ALT 11  ALKPHOS 54  BILITOT 0.4   PT/INR Recent Labs    04/09/23 0521  LABPROT 16.0*  INR 1.3*   Hepatitis Panel No results for input(s): "HEPBSAG", "HCVAB", "HEPAIGM", "HEPBIGM" in the last 72 hours.    Studies/Results: DG Chest 2 View Result Date: 04/08/2023 CLINICAL DATA:  Shortness of breath and cough EXAM: CHEST - 2 VIEW COMPARISON:  CT chest dated 10/30/2022, chest radiograph dated 09/19/2015 FINDINGS: Normal lung volumes. Multifocal bilateral lower lung predominant patchy opacities, left-greater-than-right. No pleural effusion or pneumothorax. The heart size and mediastinal contours are within normal limits. No acute osseous abnormality. Partially imaged intrathecal lead projects over the midthoracic spine. IMPRESSION: Multifocal bilateral lower lung predominant patchy opacities, left-greater-than-right, suspicious for multifocal pneumonia. Electronically Signed   By: Agustin Cree M.D.   On: 04/08/2023 14:58    IMPRESSION/PLAN:  83 year old female with COPD, chronic tobacco use admitted 04/08/2023 with acute hypoxic respiratory failure secondary to multifocal bilateral pneumonia. On Azithromycin and Ceftriaxone.  Previously on 4 L nasal cannula, currently on room air. -Management per the hospitalist  Bright red blood per the rectum x 1 episode on 1/20.  Hemoglobin 8.8 (Hg 9.5 on 04/04/2023) -> 8.3 -> today Hg 8.4. No BM or hematochezia since admission. Last dose of Eliquis was 9 am on 1/20. -CBC in a.m. -Transfuse for Hg < 8 -Diet as tolerated -Continue to monitor patient closely for active GI bleeding -Continue to hold Eliquis -Endoscopic evaluation deferred for now as the patient is recovering from bilateral pneumonia and she has not demonstrated any active bleeding since admission.  Patient does not wish to pursue a colonoscopy unless absolutely necessary.  Intermittent black/melenic  stools which occur once every 2 to 3 months, last episode was on 04/07/2023.  No GERD symptoms or upper abdominal pain. Patient never had an EGD. -Consider EGD as an outpatient after pneumonia resolved -Pantoprazole 40 mg p.o. daily  Acute on chronic normocytic anemia  History of non-Hodgkin's lymphoma  Atrial fibrillation.  On Amiodarone and Metoprolol.  Eliquis on hold, last dose  9 am on 1/20.    Malachi Carl Kennedy-Smith  04/10/2023, 10:48AM

## 2023-04-10 NOTE — Progress Notes (Signed)
Physical Therapy Treatment Patient Details Name: Samantha [name] MRN: [id number] DOB: Aug 14, 1940 Today's Date: 04/10/2023   History of Present Illness 83 yo female admitted with Pna, GI bleed. Hx of PLIF L4-L5 2020, COPD, CAd, MI, coronary stent placement, NHL, anemia    PT Comments  Pt is progressing well with mobility, she ambulated 290' with 1 standing rest break of ~3 minutes; SpO2 95% on room air at rest, 92% on room air walking, no loss of balance.      If plan is discharge home, recommend the following: Help with stairs or ramp for entrance;Assistance with cooking/housework   Can travel by private vehicle        Equipment Recommendations  None recommended by PT    Recommendations for Other Services       Precautions / Restrictions Precautions Precautions: Fall Precaution Comments: monitor O2 Restrictions Weight Bearing Restrictions Per Provider Order: No     Mobility  Bed Mobility                    Transfers                   General transfer comment: up in recliner    Ambulation/Gait Ambulation/Gait assistance: Supervision Gait Distance (Feet): 290 Feet Assistive device: Straight cane Gait Pattern/deviations: Step-through pattern, Decreased stride length Gait velocity: decr     General Gait Details: mild unsteadiness with some reaching for handrail intermittently, no loss of balance; SpO2 92% on room air; 2/4 dyspnea; 1 standing rest break for ~3 minutes   Stairs             Wheelchair Mobility     Tilt Bed    Modified Rankin (Stroke Patients Only)       Balance                                            Cognition Arousal: Alert Behavior During Therapy: WFL for tasks assessed/performed Overall Cognitive Status: Within Functional Limits for tasks assessed                                          Exercises      General Comments        Pertinent Vitals/Pain Pain  Assessment Pain Assessment: No/denies pain    Home Living                          Prior Function            PT Goals (current goals can now be found in the care plan section) Acute Rehab PT Goals Patient Stated Goal: play with her puppy PT Goal Formulation: With patient Time For Goal Achievement: 04/23/23 Potential to Achieve Goals: Good Progress towards PT goals: Progressing toward goals    Frequency    Min 1X/week      PT Plan      Co-evaluation              AM-PAC PT "6 Clicks" Mobility   Outcome Measure  Help needed turning from your back to your side while in a flat bed without using bedrails?: None Help needed moving from lying on your back to sitting on the side of a flat  bed without using bedrails?: None Help needed moving to and from a bed to a chair (including a wheelchair)?: None Help needed standing up from a chair using your arms (e.g., wheelchair or bedside chair)?: None Help needed to walk in hospital room?: None Help needed climbing 3-5 steps with a railing? : None 6 Click Score: 24    End of Session Equipment Utilized During Treatment: Gait belt Activity Tolerance: Patient tolerated treatment well;Patient limited by fatigue Patient left: in chair;with call bell/phone within reach;with chair alarm set Nurse Communication: Mobility status PT Visit Diagnosis: Difficulty in walking, not elsewhere classified (R26.2)     Time: 5956-3875 PT Time Calculation (min) (ACUTE ONLY): 16 min  Charges:    $Gait Training: 8-22 mins PT General Charges $$ ACUTE PT VISIT: 1 Visit                     Tamala Ser PT 04/10/2023  Acute Rehabilitation Services  Office [phone]

## 2023-04-10 NOTE — Evaluation (Signed)
Occupational Therapy Evaluation and Discharge Summary Patient Details Name: Samantha [name] MRN: [id number] DOB: 10/31/1940 Today's Date: 04/10/2023   History of Present Illness 83 yo female admitted with Pna, GI bleed. Hx of PLIF L4-L5 2020, COPD, CAd, MI, coronary stent placement, NHL, anemia   Clinical Impression   Pt admitted with the above diagnosis and overall is very close to her baseline level of functioning. Pt lives alone in a mobile home with assist for house keeping only.  Other than feeling a bit fatigued, pt was able to complete all basic adls. Pt instructed on energy conservation techniques and handout provided.  O2 sats were as follows all on RA: Prior to session 90% During adls 91% After session: 93% Nursing aware.  O2 left off at this time and nursing to follow up and check O2 sats.  Pt has neighbors and brother that check on her daily.  No further skilled OT needs at this time.        If plan is discharge home, recommend the following: Assistance with cooking/housework    Functional Status Assessment  Patient has not had a recent decline in their functional status  Equipment Recommendations  None recommended by OT    Recommendations for Other Services       Precautions / Restrictions Precautions Precautions: Fall Precaution Comments: monitor O2 Restrictions Weight Bearing Restrictions Per Provider Order: No      Mobility Bed Mobility Overal bed mobility: Modified Independent             General bed mobility comments: no assist given    Transfers Overall transfer level: Modified independent Equipment used: Straight cane               General transfer comment: no assist needed      Balance Overall balance assessment: Mild deficits observed, not formally tested                                         ADL either performed or assessed with clinical judgement   ADL Overall ADL's : At baseline                                        General ADL Comments: Pt states she is at baseline with all adls. Pt completed all basic bathing, dressing, toileting and grooming/feeding without assist. Pt walks in room with cane. Appears slightly unsteady but no LOB.     Vision Baseline Vision/History: 1 Wears glasses Ability to See in Adequate Light: 0 Adequate Patient Visual Report: No change from baseline Vision Assessment?: No apparent visual deficits Additional Comments: wears glasses at all times     Perception Perception: Within Functional Limits       Praxis Praxis: [facility]       Pertinent Vitals/Pain Pain Assessment Pain Assessment: No/denies pain     Extremity/Trunk Assessment Upper Extremity Assessment Upper Extremity Assessment: Overall WFL for tasks assessed   Lower Extremity Assessment Lower Extremity Assessment: Defer to PT evaluation   Cervical / Trunk Assessment Cervical / Trunk Assessment: Normal   Communication Communication Communication: No apparent difficulties   Cognition Arousal: Alert Behavior During Therapy: WFL for tasks assessed/performed Overall Cognitive Status: Within Functional Limits for tasks assessed  General Comments  Pt appears to be at or close to baseline with all adls. O2 sats were 90% or above during session on RA. Spoke to nurse about leaving O2 off and checking back on sats.    Exercises     Shoulder Instructions      Home Living Family/patient expects to be discharged to:: Private residence Living Arrangements: Alone Available Help at Discharge: Friend(s);Family;Available PRN/intermittently Type of Home: Mobile home Home Access: Stairs to enter Entrance Stairs-Number of Steps: 3 Entrance Stairs-Rails: Right;Left Home Layout: One level     Bathroom Shower/Tub: Producer, television/film/video: Handicapped height     Home Equipment: Cane - single point;Shower seat           Prior Functioning/Environment Prior Level of Function : Independent/Modified Independent;Driving             Mobility Comments: walks with cane at all times ADLs Comments: has someone assist to clean house        OT Problem List: Decreased activity tolerance      OT Treatment/Interventions:      OT Goals(Current goals can be found in the care plan section) Acute Rehab OT Goals Patient Stated Goal: to get back home OT Goal Formulation: All assessment and education complete, DC therapy  OT Frequency:      Co-evaluation              AM-PAC OT "6 Clicks" Daily Activity     Outcome Measure Help from another person eating meals?: None Help from another person taking care of personal grooming?: None Help from another person toileting, which includes using toliet, bedpan, or urinal?: None Help from another person bathing (including washing, rinsing, drying)?: None Help from another person to put on and taking off regular upper body clothing?: None Help from another person to put on and taking off regular lower body clothing?: None 6 Click Score: 24   End of Session Equipment Utilized During Treatment: Other (comment) (cane) Nurse Communication: Mobility status;Other (comment) (O2 sats above 90% on RA)  Activity Tolerance: Patient limited by fatigue Patient left: in chair;with call bell/phone within reach;with chair alarm set  OT Visit Diagnosis: Unsteadiness on feet (R26.81)                Time: 7412-8786 OT Time Calculation (min): 17 min Charges:  OT General Charges $OT Visit: 1 Visit OT Evaluation $OT Eval Low Complexity: 1 Low  Hope Budds 04/10/2023, 8:46 AM

## 2023-04-10 NOTE — Plan of Care (Signed)

## 2023-04-11 ENCOUNTER — Telehealth: Payer: Self-pay

## 2023-04-11 LAB — LEGIONELLA PNEUMOPHILA SEROGP 1 UR AG: L. pneumophila Serogp 1 Ur Ag: NEGATIVE

## 2023-04-11 NOTE — Discharge Summary (Signed)
Physician Discharge Summary   Patient: Samantha [name] MRN: [id number] DOB: 1940-10-18  Admit date:     04/08/2023  Discharge date: 04/10/2023  Discharge Physician: Kathlen Mody   PCP: Allegra Grana, FNP   Recommendations at discharge:  Please follow up with PCP ino ne week.  Please follow up with GI as recommended.   Discharge Diagnoses: Principal Problem:   Multifocal pneumonia Active Problems:   HTN (hypertension)   Anxiety and depression   Chronic low back pain   HLD (hyperlipidemia)   COPD GOLD 2 / still smoking    Cigarette smoker   GI bleed   Diverticulosis of colon without hemorrhage   Hematochezia   Acute on chronic anemia  Resolved Problems:   * No resolved hospital problems. [facility] Course: 83yo with h/o NHL, afib on Eliquis, COPD, chronic back pain, and HTN who presented on 1/20 with fever and chest congestion.  Also with chronically dark stools and one episode of a small amount of BRBPR.  Hgb 8.8, heme positive.  CXR with multifocal PNA.  Treated with Ceftriaxone and azithromycin and she was also given Protonix.82yo with h/o NHL, afib on Eliquis, COPD, chronic back pain, and HTN who presented on 1/20 with fever and chest congestion.  Also with chronically dark stools and one episode of a small amount of BRBPR.  Hgb 8.8, heme positive.  CXR with multifocal PNA.  Treated with Ceftriaxone and azithromycin and she was also given Protonix.   Assessment and Plan:   Multifocal pneumonia  Patient is a smoker with COPD Worsening and continued respiratory symptoms of late but no frank SOB, just cough and chest congestion CXR with multifocal PNA  She was started on Continue Rocephin and Zithromax and transitioned to oral augmentin.  Weaned off oxygen at the time of discharge.   Lower GI bleed She reports 1 episode of frank blood with clot in her diaper No further episodes.  GI consulted and recommendations given. Since no obvious bleeding , recommend outpatient  follow up with GI.  On Eliquis at home which was briefly held. Since she had no bleeding. She was cleared to go back on Eliquis if no bleeding for 72 hours.  Stable Hgb Transfuse for hemoglobin <7   Paroxysmal atrial fibrillation  Telemetry monitoring Rate controlled with amiodarone, metoprolol Restart eliquis on friday if she continues to have no bleeding    Essential hypertension Continue amlodipine, metoprolol   HLD Continue atorvastatin   COPD Not on home medications or O2 Continue to monitor but currently without apparent exacerbation   History of non-Hodgkin's lymphoma Recommend outpatient follow up with oncology.    Anxiety/ depression Continue with Xanax and paroxetine.   Generalized weakness debility PT/OT evaluations Continue gabapentin    Tobacco dependence Encourage cessation - she reports "only" smoking about 5 cigs/day and does not think this is a problem This was discussed with the patient and should be reviewed on an ongoing basis.   Patch declined    Consultants: gi Procedures performed: none.   Disposition: Home Diet recommendation:  Discharge Diet Orders (From admission, onward)     Start     Ordered   04/10/23 0000  Diet - low sodium heart healthy        04/10/23 1524           Regular diet DISCHARGE MEDICATION: Allergies as of 04/10/2023       Reactions   Hydrocodone Rash   Itching, rash   Oxycodone Rash  Medication List     PAUSE taking these medications    apixaban 5 MG Tabs tablet Wait to take this until: April 12, 2023 Commonly known as: Eliquis Take 1 tablet (5 mg total) by mouth 2 (two) times daily.       STOP taking these medications    OVER THE COUNTER MEDICATION       TAKE these medications    ALPRAZolam 0.25 MG tablet Commonly known as: XANAX TAKE 1 TABLET EVERY DAY AS NEEDED FOR ANXIETY   amiodarone 200 MG tablet Commonly known as: PACERONE TAKE 1/2 TABLET EVERY MORNING   amLODipine  2.5 MG tablet Commonly known as: NORVASC Take 1 tablet (2.5 mg total) by mouth daily.   amoxicillin-clavulanate 875-125 MG tablet Commonly known as: AUGMENTIN Take 1 tablet by mouth 2 (two) times daily for 6 days.   atorvastatin 40 MG tablet Commonly known as: LIPITOR Take 1 tablet (40 mg total) by mouth daily. *NEED OFFICE VISIT* TAKE 1 TABLET EVERY DAY   Cholecalciferol 1.25 MG (50000 UT) Tabs 50,000 units PO qwk for 8 weeks.   gabapentin 100 MG capsule Commonly known as: NEURONTIN Take 2 capsules (200 mg total) by mouth 2 (two) times daily.   metoprolol succinate 50 MG 24 hr tablet Commonly known as: TOPROL-XL Take 1 tablet (50 mg total) by mouth daily.   nitroGLYCERIN 0.4 MG SL tablet Commonly known as: NITROSTAT Place 1 tablet (0.4 mg total) under the tongue every 5 (five) minutes x 3 doses as needed for chest pain.   pantoprazole 40 MG tablet Commonly known as: PROTONIX Take 1 tablet (40 mg total) by mouth daily before breakfast.   PARoxetine 10 MG tablet Commonly known as: PAXIL Take 1 tablet (10 mg total) by mouth daily.        Follow-up Information     [facility] [place], [place] Follow up.   Why: [facility] will follow up with you for home health physical therapy services. Contact information: [address]ure[address] [place] [address] [phone]                Discharge Exam: Filed Weights   04/08/23 1136 04/09/23 0500  Weight: 87.5 kg 87.5 kg   General exam: Appears calm and comfortable  Respiratory system: Clear to auscultation. Respiratory effort normal. Cardiovascular system: S1 & S2 heard, RRR.  Gastrointestinal system: Abdomen is nondistended, soft and nontender. Central nervous system: Alert and oriented.  Extremities: Symmetric 5 x 5 power. Skin: No rashes,  Psychiatry: Mood & affect appropriate.    Condition at discharge: fair  The results of significant diagnostics from this  hospitalization (including imaging, microbiology, ancillary and laboratory) are listed below for reference.   Imaging Studies: DG Chest 2 View Result Date: 04/08/2023 CLINICAL DATA:  Shortness of breath and cough EXAM: CHEST - 2 VIEW COMPARISON:  CT chest dated 10/30/2022, chest radiograph dated 09/19/2015 FINDINGS: Normal lung volumes. Multifocal bilateral lower lung predominant patchy opacities, left-greater-than-right. No pleural effusion or pneumothorax. The heart size and mediastinal contours are within normal limits. No acute osseous abnormality. Partially imaged intrathecal lead projects over the midthoracic spine. IMPRESSION: Multifocal bilateral lower lung predominant patchy opacities, left-greater-than-right, suspicious for multifocal pneumonia. Electronically Signed   By: Agustin Cree M.D.   On: 04/08/2023 14:58    Microbiology: Results for orders placed or performed during the hospital encounter of 04/08/23  Resp panel by RT-PCR (RSV, Flu A&B, Covid) Anterior Nasal Swab  Status: None   Collection Time: 04/08/23 12:06 PM   Specimen: Anterior Nasal Swab  Result Value Ref Range Status   SARS Coronavirus 2 by RT PCR NEGATIVE NEGATIVE Final    Comment: (NOTE) SARS-CoV-2 target nucleic acids are NOT DETECTED.  The SARS-CoV-2 RNA is generally detectable in upper respiratory specimens during the acute phase of infection. The lowest concentration of SARS-CoV-2 viral copies this assay can detect is 138 copies/mL. A negative result does not preclude SARS-Cov-2 infection and should not be used as the sole basis for treatment or other patient management decisions. A negative result may occur with  improper specimen collection/handling, submission of specimen other than nasopharyngeal swab, presence of viral mutation(s) within the areas targeted by this assay, and inadequate number of viral copies(<138 copies/mL). A negative result must be combined with clinical observations, patient  history, and epidemiological information. The expected result is Negative.  Fact Sheet for Patients:  BloggerCourse.com  Fact Sheet for Healthcare Providers:  SeriousBroker.it  This test is no t yet approved or cleared by the [place] FDA and  has been authorized for detection and/or diagnosis of SARS-CoV-2 by FDA under an Emergency Use Authorization (EUA). This EUA will remain  in effect (meaning this test can be used) for the duration of the COVID-19 declaration under Section 564(b)(1) of the Act, 21 U.S.C.section 360bbb-3(b)(1), unless the authorization is terminated  or revoked sooner.       Influenza A by PCR NEGATIVE NEGATIVE Final   Influenza B by PCR NEGATIVE NEGATIVE Final    Comment: (NOTE) The Xpert Xpress SARS-CoV-2/FLU/RSV plus assay is intended as an aid in the diagnosis of influenza from Nasopharyngeal swab specimens and should not be used as a sole basis for treatment. Nasal washings and aspirates are unacceptable for Xpert Xpress SARS-CoV-2/FLU/RSV testing.  Fact Sheet for Patients: BloggerCourse.com  Fact Sheet for Healthcare Providers: SeriousBroker.it  This test is not yet approved or cleared by the [place] FDA and has been authorized for detection and/or diagnosis of SARS-CoV-2 by FDA under an Emergency Use Authorization (EUA). This EUA will remain in effect (meaning this test can be used) for the duration of the COVID-19 declaration under Section 564(b)(1) of the Act, 21 U.S.C. section 360bbb-3(b)(1), unless the authorization is terminated or revoked.     Resp Syncytial Virus by PCR NEGATIVE NEGATIVE Final    Comment: (NOTE) Fact Sheet for Patients: BloggerCourse.com  Fact Sheet for Healthcare Providers: SeriousBroker.it  This test is not yet approved or cleared by the [place] FDA  and has been authorized for detection and/or diagnosis of SARS-CoV-2 by FDA under an Emergency Use Authorization (EUA). This EUA will remain in effect (meaning this test can be used) for the duration of the COVID-19 declaration under Section 564(b)(1) of the Act, 21 U.S.C. section 360bbb-3(b)(1), unless the authorization is terminated or revoked.  Performed at [facility], [address] [place], [place] [address]     Labs: CBC: Recent Labs  Lab 04/08/23 1219 04/09/23 0521 04/10/23 0525  WBC 7.4 5.8 6.3  NEUTROABS 4.2  --  2.9  HGB 8.8* 8.3* 8.4*  HCT 29.9* 28.6* 29.4*  MCV 81.3 82.4 82.1  PLT 214 196 229   Basic Metabolic Panel: Recent Labs  Lab 04/08/23 1219 04/09/23 0521 04/10/23 0525  NA 138 139 142  K 3.5 3.2* 4.0  CL 104 109 110  CO2 28 25 26   GLUCOSE 93 85 90  BUN 10 9 9   CREATININE 0.72 0.37*  0.56  CALCIUM 7.9* 7.5* 7.9*  MG  --  1.9  --   PHOS  --  2.9  --    Liver Function Tests: Recent Labs  Lab 04/08/23 1219  AST 18  ALT 11  ALKPHOS 54  BILITOT 0.4  PROT 6.2*  ALBUMIN 2.9*   CBG: Recent Labs  Lab 04/08/23 1136  GLUCAP 84    Discharge time spent: 42 minutes.   Signed: Kathlen Mody, MD [facility]

## 2023-04-11 NOTE — Transitions of Care (Post Inpatient/ED Visit) (Signed)
04/11/2023  Name: Samantha [name] MRN: [id number] DOB: 1940/06/08  Today's TOC FU Call Status: Today's TOC FU Call Status:: Successful TOC FU Call Completed TOC FU Call Complete Date: 04/11/23 Patient's Name and Date of Birth confirmed.  Transition Care Management Follow-up Telephone Call Date of Discharge: 04/10/23 Discharge Facility: [facility]) Type of Discharge: Inpatient Admission Primary Inpatient Discharge Diagnosis:: Pneumonia How have you been since you were released from the hospital?: Better Any questions or concerns?: No  Items Reviewed: Did you receive and understand the discharge instructions provided?: Yes Medications obtained,verified, and reconciled?: Yes (Medications Reviewed) Any new allergies since your discharge?: No Dietary orders reviewed?: NA Do you have support at home?: No  Medications Reviewed Today: Medications Reviewed Today     Reviewed by Redge Gainer, RN (Case Manager) on 04/11/23 at [phone]  Med List Status: <None>   Medication Order Taking? Sig Documenting Provider Last Dose Status Informant  ALPRAZolam (XANAX) 0.25 MG tablet [id number] No TAKE 1 TABLET EVERY DAY AS NEEDED FOR ANXIETY Allegra Grana, FNP Taking Active Self, Pharmacy Records  amiodarone (PACERONE) 200 MG tablet [id number] No TAKE 1/2 TABLET EVERY MORNING Antonieta Iba, MD 04/08/2023 Morning Active Self, Pharmacy Records  amLODipine (NORVASC) 2.5 MG tablet [id number] No Take 1 tablet (2.5 mg total) by mouth daily. Allegra Grana, FNP 04/08/2023 Active Self, Pharmacy Records  amoxicillin-clavulanate (AUGMENTIN) 875-125 MG tablet [id number]  Take 1 tablet by mouth 2 (two) times daily for 6 days. Kathlen Mody, MD  Active   apixaban (ELIQUIS) 5 MG TABS tablet [id number] No Take 1 tablet (5 mg total) by mouth 2 (two) times daily. Antonieta Iba, MD 04/08/2023  9:00 AM Active Self, Pharmacy Records  atorvastatin (LIPITOR) 40 MG tablet [id number] No Take 1 tablet (40 mg  total) by mouth daily. *NEED OFFICE VISIT* TAKE 1 TABLET EVERY DAY Allegra Grana, FNP 04/08/2023 Morning Active Self, Pharmacy Records  Cholecalciferol 1.25 MG (50000 UT) TABS [id number] No 50,000 units PO qwk for 8 weeks.  Patient not taking: Reported on 04/08/2023   Allegra Grana, FNP Not Taking Active Self, Pharmacy Records  gabapentin (NEURONTIN) 100 MG capsule [id number] No Take 2 capsules (200 mg total) by mouth 2 (two) times daily. Allegra Grana, FNP 04/08/2023 Morning Active Self, Pharmacy Records  metoprolol succinate (TOPROL-XL) 50 MG 24 hr tablet [id number] No Take 1 tablet (50 mg total) by mouth daily. Allegra Grana, FNP 04/08/2023 Active Self, Pharmacy Records  nitroGLYCERIN (NITROSTAT) 0.4 MG SL tablet [id number] No Place 1 tablet (0.4 mg total) under the tongue every 5 (five) minutes x 3 doses as needed for chest pain. Antonieta Iba, MD Taking Active Self, Pharmacy Records  pantoprazole (PROTONIX) 40 MG tablet [id number]  Take 1 tablet (40 mg total) by mouth daily before breakfast. Kathlen Mody, MD  Active   PARoxetine (PAXIL) 10 MG tablet [id number] No Take 1 tablet (10 mg total) by mouth daily. Allegra Grana, FNP 04/08/2023 Morning Active Self, Pharmacy Records            Home Care and Equipment/Supplies: Were Home Health Services Ordered?: Yes Name of Home Health Agency:: [facility] Has Agency set up a time to come to your home?: Yes First Home Health Visit Date: 04/12/23 Any new equipment or medical supplies ordered?: No  Functional Questionnaire: Do you need assistance with bathing/showering or dressing?: No Do you need assistance with meal preparation?: No Do you need assistance with eating?: No Do you have  difficulty maintaining continence: No Do you need assistance with getting out of bed/getting out of a chair/moving?: No Do you have difficulty managing or taking your medications?: No  Follow up appointments reviewed: PCP Follow-up  appointment confirmed?: Yes Date of PCP follow-up appointment?: 04/25/23 Follow-up Provider: Rosina Lowenstein Follow-up appointment confirmed?: NA Do you need transportation to your follow-up appointment?: No Do you understand care options if your condition(s) worsen?: Yes-patient verbalized understanding  SDOH Interventions Today    Flowsheet Row Most Recent Value  SDOH Interventions   Food Insecurity Interventions Intervention Not Indicated  Housing Interventions Intervention Not Indicated  Transportation Interventions Intervention Not Indicated  Utilities Interventions Intervention Not Indicated  Social Connections Interventions Intervention Not Indicated      Interventions Today    Flowsheet Row Most Recent Value  Chronic Disease   Chronic disease during today's visit Chronic Obstructive Pulmonary Disease (COPD)  General Interventions   General Interventions Discussed/Reviewed General Interventions Discussed, Doctor Visits  Doctor Visits Discussed/Reviewed Doctor Visits Discussed  Education Interventions   Education Provided Provided Education  Provided Verbal Education On Medication  Pharmacy Interventions   Pharmacy Dicussed/Reviewed Medications and their functions  Safety Interventions   Safety Discussed/Reviewed Fall Risk        TOC Outreach completed today. The patient was discharged home from the hospital with orders for HHPT. The patient states [facility] called to set up an appointment and the patient declined it for today and will schedule another time. She states she is feeling good, no concerns, understands her discharge instructions. She states she hasn't had a cigarette in about four days but she may go back to smoking. Declines the 30 Day Outreach program   Deidre Ala, Programmer, systems [facility] [phone]

## 2023-04-12 ENCOUNTER — Telehealth: Payer: Self-pay

## 2023-04-12 DIAGNOSIS — F32A Depression, unspecified: Secondary | ICD-10-CM | POA: Diagnosis not present

## 2023-04-12 DIAGNOSIS — I2510 Atherosclerotic heart disease of native coronary artery without angina pectoris: Secondary | ICD-10-CM | POA: Diagnosis not present

## 2023-04-12 DIAGNOSIS — Z9181 History of falling: Secondary | ICD-10-CM | POA: Diagnosis not present

## 2023-04-12 DIAGNOSIS — C8590 Non-Hodgkin lymphoma, unspecified, unspecified site: Secondary | ICD-10-CM | POA: Diagnosis not present

## 2023-04-12 DIAGNOSIS — J188 Other pneumonia, unspecified organism: Secondary | ICD-10-CM | POA: Diagnosis not present

## 2023-04-12 DIAGNOSIS — D6489 Other specified anemias: Secondary | ICD-10-CM | POA: Diagnosis not present

## 2023-04-12 DIAGNOSIS — K922 Gastrointestinal hemorrhage, unspecified: Secondary | ICD-10-CM | POA: Diagnosis not present

## 2023-04-12 DIAGNOSIS — Z8616 Personal history of COVID-19: Secondary | ICD-10-CM | POA: Diagnosis not present

## 2023-04-12 DIAGNOSIS — I10 Essential (primary) hypertension: Secondary | ICD-10-CM | POA: Diagnosis not present

## 2023-04-12 DIAGNOSIS — I480 Paroxysmal atrial fibrillation: Secondary | ICD-10-CM | POA: Diagnosis not present

## 2023-04-12 DIAGNOSIS — Z7901 Long term (current) use of anticoagulants: Secondary | ICD-10-CM | POA: Diagnosis not present

## 2023-04-12 DIAGNOSIS — F419 Anxiety disorder, unspecified: Secondary | ICD-10-CM | POA: Diagnosis not present

## 2023-04-12 DIAGNOSIS — F17210 Nicotine dependence, cigarettes, uncomplicated: Secondary | ICD-10-CM | POA: Diagnosis not present

## 2023-04-12 DIAGNOSIS — M1990 Unspecified osteoarthritis, unspecified site: Secondary | ICD-10-CM | POA: Diagnosis not present

## 2023-04-12 DIAGNOSIS — J9601 Acute respiratory failure with hypoxia: Secondary | ICD-10-CM | POA: Diagnosis not present

## 2023-04-12 DIAGNOSIS — J449 Chronic obstructive pulmonary disease, unspecified: Secondary | ICD-10-CM | POA: Diagnosis not present

## 2023-04-12 NOTE — Telephone Encounter (Signed)
Copied from CRM [id number]. Topic: Clinical - Home Health Verbal Orders >> Apr 12, 2023  1:42 PM Corin V wrote: Caller/Agency: [facility] Number: [phone] Service Requested: Physical Therapy Frequency: 1 week 1, 2 week 3, 1 week 3 Any new concerns about the patient? No

## 2023-04-15 ENCOUNTER — Telehealth: Payer: Self-pay

## 2023-04-15 DIAGNOSIS — J449 Chronic obstructive pulmonary disease, unspecified: Secondary | ICD-10-CM

## 2023-04-15 NOTE — Telephone Encounter (Signed)
Copied from CRM [id number]. Topic: Clinical - Medical Advice >> Apr 15, 2023  9:57 AM Elizebeth Brooking wrote: Reason for CRM: [facility] called in regarding patient physical exam, 01/24 85% with activity with extended recovery period

## 2023-04-15 NOTE — Telephone Encounter (Signed)
Spoke to [place]- PT with [facility] [phone]  gave verbal orders otp for pt

## 2023-04-15 NOTE — Telephone Encounter (Signed)
noted

## 2023-04-15 NOTE — Telephone Encounter (Signed)
Spoke to pt and she stated that she is breathing fine, she sounding like she normally does in my opinion, pt stated that this reading may have been from 04/12/23 when nurse came out to visit her at home, I asked pt did she have a way to check her O2 @ home, she stated she can not find O2 sensor. I Stated to her that I would send message back to provider and see what she would like to do.

## 2023-04-15 NOTE — Telephone Encounter (Signed)
Call [facility] and pt  [facility] [phone]   Concerned patient my need home oxygen ; she was weaned off at hospital discharge after pneumonia.   If Sa02 less than 90% walking, would warrant to emergency room or calling 911 and advise patient return to ED.   Please express my concern for her safety to patient  She has history of COPD   She will also need to follow up with pulmonology as an outpatient; I can arrange consult once she is stable. Referral placed today however her safety is most important right now and if 02 less than 90, she is not safe to be home

## 2023-04-16 DIAGNOSIS — C8590 Non-Hodgkin lymphoma, unspecified, unspecified site: Secondary | ICD-10-CM | POA: Diagnosis not present

## 2023-04-16 DIAGNOSIS — F419 Anxiety disorder, unspecified: Secondary | ICD-10-CM | POA: Diagnosis not present

## 2023-04-16 DIAGNOSIS — M1990 Unspecified osteoarthritis, unspecified site: Secondary | ICD-10-CM | POA: Diagnosis not present

## 2023-04-16 DIAGNOSIS — J188 Other pneumonia, unspecified organism: Secondary | ICD-10-CM | POA: Diagnosis not present

## 2023-04-16 DIAGNOSIS — F17210 Nicotine dependence, cigarettes, uncomplicated: Secondary | ICD-10-CM | POA: Diagnosis not present

## 2023-04-16 DIAGNOSIS — J449 Chronic obstructive pulmonary disease, unspecified: Secondary | ICD-10-CM | POA: Diagnosis not present

## 2023-04-16 DIAGNOSIS — I480 Paroxysmal atrial fibrillation: Secondary | ICD-10-CM | POA: Diagnosis not present

## 2023-04-16 DIAGNOSIS — Z9181 History of falling: Secondary | ICD-10-CM | POA: Diagnosis not present

## 2023-04-16 DIAGNOSIS — I10 Essential (primary) hypertension: Secondary | ICD-10-CM | POA: Diagnosis not present

## 2023-04-16 DIAGNOSIS — F32A Depression, unspecified: Secondary | ICD-10-CM | POA: Diagnosis not present

## 2023-04-16 DIAGNOSIS — I2510 Atherosclerotic heart disease of native coronary artery without angina pectoris: Secondary | ICD-10-CM | POA: Diagnosis not present

## 2023-04-16 DIAGNOSIS — J9601 Acute respiratory failure with hypoxia: Secondary | ICD-10-CM | POA: Diagnosis not present

## 2023-04-16 DIAGNOSIS — Z7901 Long term (current) use of anticoagulants: Secondary | ICD-10-CM | POA: Diagnosis not present

## 2023-04-16 DIAGNOSIS — K922 Gastrointestinal hemorrhage, unspecified: Secondary | ICD-10-CM | POA: Diagnosis not present

## 2023-04-16 DIAGNOSIS — Z8616 Personal history of COVID-19: Secondary | ICD-10-CM | POA: Diagnosis not present

## 2023-04-16 DIAGNOSIS — D6489 Other specified anemias: Secondary | ICD-10-CM | POA: Diagnosis not present

## 2023-04-16 NOTE — Telephone Encounter (Signed)
Call patient Please advise her to stay extremely vigilant in regards to shortness of breath.  Advised her to purchase a pulse oximeter and if less than 90%, this is an immediate phone call to 911 as she would then require oxygen.  I placed an urgent referral to pulmonology.  Please keep appointment with me next month

## 2023-04-16 NOTE — Telephone Encounter (Signed)
Spoke to pt about note below pt verbalized understanding pt stated that she is just fine

## 2023-04-16 NOTE — Addendum Note (Signed)
Addended by: Allegra Grana on: 04/16/2023 01:50 PM   Modules accepted: Orders

## 2023-04-17 NOTE — Telephone Encounter (Signed)
Can you let me know once pulmonology appt is made?

## 2023-04-18 DIAGNOSIS — Z7901 Long term (current) use of anticoagulants: Secondary | ICD-10-CM | POA: Diagnosis not present

## 2023-04-18 DIAGNOSIS — I10 Essential (primary) hypertension: Secondary | ICD-10-CM | POA: Diagnosis not present

## 2023-04-18 DIAGNOSIS — J188 Other pneumonia, unspecified organism: Secondary | ICD-10-CM | POA: Diagnosis not present

## 2023-04-18 DIAGNOSIS — I2510 Atherosclerotic heart disease of native coronary artery without angina pectoris: Secondary | ICD-10-CM | POA: Diagnosis not present

## 2023-04-18 DIAGNOSIS — K922 Gastrointestinal hemorrhage, unspecified: Secondary | ICD-10-CM | POA: Diagnosis not present

## 2023-04-18 DIAGNOSIS — D6489 Other specified anemias: Secondary | ICD-10-CM | POA: Diagnosis not present

## 2023-04-18 DIAGNOSIS — Z9181 History of falling: Secondary | ICD-10-CM | POA: Diagnosis not present

## 2023-04-18 DIAGNOSIS — C8590 Non-Hodgkin lymphoma, unspecified, unspecified site: Secondary | ICD-10-CM | POA: Diagnosis not present

## 2023-04-18 DIAGNOSIS — J9601 Acute respiratory failure with hypoxia: Secondary | ICD-10-CM | POA: Diagnosis not present

## 2023-04-18 DIAGNOSIS — I480 Paroxysmal atrial fibrillation: Secondary | ICD-10-CM | POA: Diagnosis not present

## 2023-04-18 DIAGNOSIS — F17210 Nicotine dependence, cigarettes, uncomplicated: Secondary | ICD-10-CM | POA: Diagnosis not present

## 2023-04-18 DIAGNOSIS — M1990 Unspecified osteoarthritis, unspecified site: Secondary | ICD-10-CM | POA: Diagnosis not present

## 2023-04-18 DIAGNOSIS — F32A Depression, unspecified: Secondary | ICD-10-CM | POA: Diagnosis not present

## 2023-04-18 DIAGNOSIS — F419 Anxiety disorder, unspecified: Secondary | ICD-10-CM | POA: Diagnosis not present

## 2023-04-18 DIAGNOSIS — Z8616 Personal history of COVID-19: Secondary | ICD-10-CM | POA: Diagnosis not present

## 2023-04-18 DIAGNOSIS — J449 Chronic obstructive pulmonary disease, unspecified: Secondary | ICD-10-CM | POA: Diagnosis not present

## 2023-04-19 NOTE — Telephone Encounter (Signed)
Sch 04/22/22 with pulmonology

## 2023-04-22 ENCOUNTER — Telehealth: Payer: Self-pay

## 2023-04-22 DIAGNOSIS — I2510 Atherosclerotic heart disease of native coronary artery without angina pectoris: Secondary | ICD-10-CM | POA: Diagnosis not present

## 2023-04-22 DIAGNOSIS — M1990 Unspecified osteoarthritis, unspecified site: Secondary | ICD-10-CM | POA: Diagnosis not present

## 2023-04-22 DIAGNOSIS — I10 Essential (primary) hypertension: Secondary | ICD-10-CM | POA: Diagnosis not present

## 2023-04-22 DIAGNOSIS — D6489 Other specified anemias: Secondary | ICD-10-CM | POA: Diagnosis not present

## 2023-04-22 DIAGNOSIS — J188 Other pneumonia, unspecified organism: Secondary | ICD-10-CM | POA: Diagnosis not present

## 2023-04-22 DIAGNOSIS — J9601 Acute respiratory failure with hypoxia: Secondary | ICD-10-CM | POA: Diagnosis not present

## 2023-04-22 DIAGNOSIS — Z7901 Long term (current) use of anticoagulants: Secondary | ICD-10-CM | POA: Diagnosis not present

## 2023-04-22 DIAGNOSIS — K922 Gastrointestinal hemorrhage, unspecified: Secondary | ICD-10-CM | POA: Diagnosis not present

## 2023-04-22 DIAGNOSIS — J449 Chronic obstructive pulmonary disease, unspecified: Secondary | ICD-10-CM | POA: Diagnosis not present

## 2023-04-22 DIAGNOSIS — C8590 Non-Hodgkin lymphoma, unspecified, unspecified site: Secondary | ICD-10-CM | POA: Diagnosis not present

## 2023-04-22 DIAGNOSIS — Z9181 History of falling: Secondary | ICD-10-CM | POA: Diagnosis not present

## 2023-04-22 DIAGNOSIS — F32A Depression, unspecified: Secondary | ICD-10-CM | POA: Diagnosis not present

## 2023-04-22 DIAGNOSIS — I480 Paroxysmal atrial fibrillation: Secondary | ICD-10-CM | POA: Diagnosis not present

## 2023-04-22 DIAGNOSIS — F17210 Nicotine dependence, cigarettes, uncomplicated: Secondary | ICD-10-CM | POA: Diagnosis not present

## 2023-04-22 DIAGNOSIS — Z8616 Personal history of COVID-19: Secondary | ICD-10-CM | POA: Diagnosis not present

## 2023-04-22 DIAGNOSIS — F419 Anxiety disorder, unspecified: Secondary | ICD-10-CM | POA: Diagnosis not present

## 2023-04-22 NOTE — Telephone Encounter (Signed)
Spoke to pt and resolved concern

## 2023-04-22 NOTE — Telephone Encounter (Signed)
Copied from CRM [id number]. Topic: Clinical - Medical Advice >> Apr 22, 2023  9:58 AM Elizebeth Brooking wrote: Reason for CRM: Have questions and would like to speak to nurse, is requesting a callback

## 2023-04-23 ENCOUNTER — Encounter: Payer: Self-pay | Admitting: Pulmonary Disease

## 2023-04-23 ENCOUNTER — Other Ambulatory Visit: Payer: Medicare HMO

## 2023-04-23 ENCOUNTER — Ambulatory Visit: Payer: Medicare HMO | Admitting: Pulmonary Disease

## 2023-04-23 VITALS — BP 136/76 | HR 60 | Temp 97.9°F | Ht 69.0 in | Wt 188.8 lb

## 2023-04-23 DIAGNOSIS — J4489 Other specified chronic obstructive pulmonary disease: Secondary | ICD-10-CM

## 2023-04-23 DIAGNOSIS — J439 Emphysema, unspecified: Secondary | ICD-10-CM | POA: Diagnosis not present

## 2023-04-23 MED ORDER — FLUTICASONE FUROATE-VILANTEROL 100-25 MCG/ACT IN AEPB: 1.0000 | INHALATION_SPRAY | Freq: Every day | RESPIRATORY_TRACT | 3 refills | Status: AC

## 2023-04-25 ENCOUNTER — Ambulatory Visit (INDEPENDENT_AMBULATORY_CARE_PROVIDER_SITE_OTHER): Payer: Medicare HMO | Admitting: Family

## 2023-04-25 ENCOUNTER — Encounter: Payer: Self-pay | Admitting: Family

## 2023-04-25 VITALS — BP 130/70 | HR 70 | Temp 97.7°F | Ht 73.0 in | Wt 190.6 lb

## 2023-04-25 DIAGNOSIS — J449 Chronic obstructive pulmonary disease, unspecified: Secondary | ICD-10-CM

## 2023-04-25 DIAGNOSIS — J189 Pneumonia, unspecified organism: Secondary | ICD-10-CM

## 2023-04-25 DIAGNOSIS — D649 Anemia, unspecified: Secondary | ICD-10-CM | POA: Diagnosis not present

## 2023-04-25 NOTE — Assessment & Plan Note (Signed)
 Reestablished with pulmonology.  Continue Breo.  Will follow.

## 2023-04-25 NOTE — Assessment & Plan Note (Addendum)
 Discussed dark-colored stools. No BRB at this time.  Known h/o hemorrhoids.  She politely declines gastroenterology consult this time.  Will monitor anemia.  Consider EGD if anemia persists, worsens. Continue Protonix  40 mg daily

## 2023-04-25 NOTE — Assessment & Plan Note (Signed)
 Shortness of breath has improved.  Counseled on return to emergency room if Sa02 were to drop less than 90%.  Reviewed pulmonology consult.  CT chest pending.  Will follow.

## 2023-04-25 NOTE — Progress Notes (Signed)
 Synopsis: Referred in by Samantha Rollene MATSU, FNP   Subjective:   PATIENT ID: Samantha [name] GENDER: female DOB: [birth year], MRN: [id number]  Chief Complaint  Patient presents with   pulmonary consult    Recently dx with PNA.  C/o SOB with exertion, prod cough with white sputum and wheezing.     HPI Samantha [name] is an 83 year old female patient with a past medical history of hypertension, anxiety, paroxysmal A.fib on eliquis  and COPD presenting today to the pulmonary clinic to re-establish care.   She saw Samantha [name] in Sept 2022 for dyspnea on exertion and was supposed to be on Symbicort . However she was lost to follow up and currently is only on albuterol .   PFTs 2022 - Obstructive defect with significant response to BDL. Normal lung volumes and moderately reduced DLCO.   She contracted a multifocal pneumonia in January 2025 c/b  COPD exacerbation which led to shortness of breath chest tightness and wheezing. This was managed appropriately and she has imbroved signigicantly.   ROS All systems were reviewed and are negative except for the above. Objective:   Vitals:   04/23/23 1520  BP: 136/76  Pulse: 60  Temp: 97.9 F (36.6 C)  TempSrc: Temporal  SpO2: 97%  Weight: 188 lb 12.8 oz (85.6 kg)  Height: 5' 9 (1.753 m)   97% on  RA BMI Readings from Last 3 Encounters:  04/25/23 25.15 kg/m  04/23/23 27.88 kg/m  04/09/23 28.50 kg/m   Wt Readings from Last 3 Encounters:  04/25/23 190 lb 9.6 oz (86.5 kg)  04/23/23 188 lb 12.8 oz (85.6 kg)  04/09/23 192 lb 15.9 oz (87.5 kg)    Physical Exam GEN: NAD, Healthy Appearing HEENT: Supple Neck, Reactive Pupils, EOMI  CVS: Normal S1, Normal S2, RRR, No murmurs or ES appreciated  Lungs: Faint expiratory wheezing heard bilaterally.  Abdomen: Soft, non tender, non distended, + BS  Extremities: Warm and well perfused, No edema  Skin: No suspicious lesions appreciated  Psych: Normal Affect  Ancillary Information   CBC     Component Value Date/Time   WBC 6.3 04/10/2023 0525   RBC 3.58 (L) 04/10/2023 0525   HGB 8.4 (L) 04/10/2023 0525   HGB 10.1 (L) 11/27/2022 1144   HGB 12.7 03/04/2017 1349   HCT 29.4 (L) 04/10/2023 0525   HCT 39.8 03/04/2017 1349   PLT 229 04/10/2023 0525   PLT 283 11/27/2022 1144   PLT 180 03/04/2017 1349   MCV 82.1 04/10/2023 0525   MCV 99.0 03/04/2017 1349   MCH 23.5 (L) 04/10/2023 0525   MCHC 28.6 (L) 04/10/2023 0525   RDW 15.6 (H) 04/10/2023 0525   RDW 12.9 03/04/2017 1349   LYMPHSABS 2.5 04/10/2023 0525   LYMPHSABS 1.9 03/04/2017 1349   MONOABS 0.7 04/10/2023 0525   MONOABS 0.6 03/04/2017 1349   EOSABS 0.2 04/10/2023 0525   EOSABS 0.2 03/04/2017 1349   BASOSABS 0.0 04/10/2023 0525   BASOSABS 0.0 03/04/2017 1349        Latest Ref Rng & Units 01/26/2021    2:39 PM  PFT Results  FVC-Pre L 2.39   FVC-Predicted Pre % 72   FVC-Post L 2.63   FVC-Predicted Post % 79   Pre FEV1/FVC % % 68   Post FEV1/FCV % % 68   FEV1-Pre L 1.62   FEV1-Predicted Pre % 65   FEV1-Post L 1.79   DLCO uncorrected ml/min/mmHg 14.10   DLCO UNC% % 63   DLVA Predicted %  74   TLC L 5.30   TLC % Predicted % 91   RV % Predicted % 102      Assessment & Plan:  Samantha [name] is an 83 year old female patient with a past medical history of hypertension, anxiety, paroxysmal A.fib on eliquis  and COPD presenting today to the pulmonary clinic to re-establish care.  #Stage II COPD GpA  #Asthma overlap #Multifocal pneumonia 03/2023  []  PFTs to assess progression of her COPD []  Start Fluticasone -Vilanterol [Breo] 100 1puff daily.  []  Albuterol  as needed.  []  Obtain a CT chest to confirm resolution of her opacities.   Return in about 3 months (around 07/21/2023) for with ct chest and pfts.  I spent 60 minutes caring for this patient today, including preparing to see the patient, obtaining a medical history , reviewing a separately obtained history, performing a medically appropriate examination and/or  evaluation, counseling and educating the patient/family/caregiver, ordering medications, tests, or procedures, documenting clinical information in the electronic health record, and independently interpreting results (not separately reported/billed) and communicating results to the patient/family/caregiver  Samantha Barn, MD [facility] 04/28/2023 4:01 PM

## 2023-04-25 NOTE — Progress Notes (Signed)
 Assessment & Plan:  Multifocal pneumonia Assessment & Plan: Shortness of breath has improved.  Counseled on return to emergency room if Sa02 were to drop less than 90%.  Reviewed pulmonology consult.  CT chest pending.  Will follow.   Acute on chronic anemia Assessment & Plan: Discussed dark-colored stools. No BRB at this time.  Known h/o hemorrhoids.  She politely declines gastroenterology consult this time.  Will monitor anemia.  Consider EGD if anemia persists, worsens. Continue Protonix  40 mg daily  Orders: -     CBC with Differential/Platelet; Future -     Iron , TIBC and Ferritin Panel; Future -     Erythropoietin ; Future -     B12 and Folate Panel; Future -     Multiple Myeloma Panel (SPEP&IFE w/QIG); Future  COPD GOLD 2 / still smoking  Assessment & Plan: Reestablished with pulmonology.  Continue Breo.  Will follow.      Return precautions given.   Risks, benefits, and alternatives of the medications and treatment plan prescribed today were discussed, and patient expressed understanding.   Education regarding symptom management and diagnosis given to patient on AVS either electronically or printed.  Return in about 3 months (around 07/23/2023).  Rollene Northern, FNP  Subjective:    Patient ID: Samantha [name], female    DOB: February 13, 1941, 83 y.o.   MRN: [id number]  CC: Samantha [name] is a 83 y.o. female who presents today for follow up.   HPI: Feels mostly.  No new complaints .  occassional productive cough, improved SOB has improved.   Denies F, cp.  Sa02 97% when PT comes to the house.   Stool is dark in color. No BRB.  She is not taking an iron  supplement.  No vomiting  Compliant with Protonix  40 mg daily  Presents emergency room 04/08/2023  Fever, congestion, dark stools and a small amount of bright red blood Chest x-ray of multifocal pneumonia.  Treat with ceftriaxone , azithromycin   Given Protonix  Heme positive.  Hemoglobin 8.8 Resumed Eliquis  at  discharge consult with pulmonology 04/23/23. Pending pulmonary function test, CT chest without contrast.  Started on Breo  Last colonoscopy 01/19/2011, Dr. Viktoria.  Known internal hemorrhoids     Allergies: Hydrocodone and Oxycodone  Current Outpatient Medications on File Prior to Visit  Medication Sig Dispense Refill   ALPRAZolam  (XANAX ) 0.25 MG tablet TAKE 1 TABLET EVERY DAY AS NEEDED FOR ANXIETY 30 tablet 2   amiodarone  (PACERONE ) 200 MG tablet TAKE 1/2 TABLET EVERY MORNING 45 tablet 3   amLODipine  (NORVASC ) 2.5 MG tablet Take 1 tablet (2.5 mg total) by mouth daily. 90 tablet 3   apixaban  (ELIQUIS ) 5 MG TABS tablet Take 1 tablet (5 mg total) by mouth 2 (two) times daily. 180 tablet 1   atorvastatin  (LIPITOR) 40 MG tablet Take 1 tablet (40 mg total) by mouth daily. *NEED OFFICE VISIT* TAKE 1 TABLET EVERY DAY 90 tablet 3   Cholecalciferol  1.25 MG (50000 UT) TABS 50,000 units PO qwk for 8 weeks. 8 tablet 0   fluticasone  furoate-vilanterol (BREO ELLIPTA ) 100-25 MCG/ACT AEPB Inhale 1 puff into the lungs daily. 90 each 3   gabapentin  (NEURONTIN ) 100 MG capsule Take 2 capsules (200 mg total) by mouth 2 (two) times daily.     metoprolol  succinate (TOPROL -XL) 50 MG 24 hr tablet Take 1 tablet (50 mg total) by mouth daily. 90 tablet 3   nitroGLYCERIN  (NITROSTAT ) 0.4 MG SL tablet Place 1 tablet (0.4 mg total) under the tongue every 5 (  five) minutes x 3 doses as needed for chest pain. 25 tablet 0   pantoprazole  (PROTONIX ) 40 MG tablet Take 1 tablet (40 mg total) by mouth daily before breakfast. 30 tablet 1   PARoxetine  (PAXIL ) 10 MG tablet Take 1 tablet (10 mg total) by mouth daily. 90 tablet 3   No current facility-administered medications on file prior to visit.    Review of Systems  Constitutional:  Negative for chills and fever.  HENT:  Positive for congestion.   Respiratory:  Positive for cough and shortness of breath (baseline, improved).   Cardiovascular:  Negative for chest pain and  palpitations.  Gastrointestinal:  Negative for abdominal pain, anal bleeding, nausea, rectal pain and vomiting.      Objective:    BP 130/70   Pulse 70   Temp 97.7 F (36.5 C) (Oral)   Ht 6' 1 (1.854 m)   Wt 190 lb 9.6 oz (86.5 kg)   SpO2 94%   BMI 25.15 kg/m  BP Readings from Last 3 Encounters:  04/25/23 130/70  04/23/23 136/76  04/10/23 (!) 144/64   Wt Readings from Last 3 Encounters:  04/25/23 190 lb 9.6 oz (86.5 kg)  04/23/23 188 lb 12.8 oz (85.6 kg)  04/09/23 192 lb 15.9 oz (87.5 kg)    Physical Exam Vitals reviewed.  Constitutional:      Appearance: She is well-developed.  Eyes:     Conjunctiva/sclera: Conjunctivae normal.  Cardiovascular:     Rate and Rhythm: Normal rate and regular rhythm.     Pulses: Normal pulses.     Heart sounds: Normal heart sounds.  Pulmonary:     Effort: Pulmonary effort is normal.     Breath sounds: Normal breath sounds. No wheezing, rhonchi or rales.  Skin:    General: Skin is warm and dry.  Neurological:     Mental Status: She is alert.  Psychiatric:        Speech: Speech normal.        Behavior: Behavior normal.        Thought Content: Thought content normal.

## 2023-04-30 DIAGNOSIS — F17210 Nicotine dependence, cigarettes, uncomplicated: Secondary | ICD-10-CM | POA: Diagnosis not present

## 2023-04-30 DIAGNOSIS — I480 Paroxysmal atrial fibrillation: Secondary | ICD-10-CM | POA: Diagnosis not present

## 2023-04-30 DIAGNOSIS — Z9181 History of falling: Secondary | ICD-10-CM | POA: Diagnosis not present

## 2023-04-30 DIAGNOSIS — J188 Other pneumonia, unspecified organism: Secondary | ICD-10-CM | POA: Diagnosis not present

## 2023-04-30 DIAGNOSIS — J9601 Acute respiratory failure with hypoxia: Secondary | ICD-10-CM | POA: Diagnosis not present

## 2023-04-30 DIAGNOSIS — Z8616 Personal history of COVID-19: Secondary | ICD-10-CM | POA: Diagnosis not present

## 2023-04-30 DIAGNOSIS — K922 Gastrointestinal hemorrhage, unspecified: Secondary | ICD-10-CM | POA: Diagnosis not present

## 2023-04-30 DIAGNOSIS — I10 Essential (primary) hypertension: Secondary | ICD-10-CM | POA: Diagnosis not present

## 2023-04-30 DIAGNOSIS — F32A Depression, unspecified: Secondary | ICD-10-CM | POA: Diagnosis not present

## 2023-04-30 DIAGNOSIS — J449 Chronic obstructive pulmonary disease, unspecified: Secondary | ICD-10-CM | POA: Diagnosis not present

## 2023-04-30 DIAGNOSIS — D6489 Other specified anemias: Secondary | ICD-10-CM | POA: Diagnosis not present

## 2023-04-30 DIAGNOSIS — Z7901 Long term (current) use of anticoagulants: Secondary | ICD-10-CM | POA: Diagnosis not present

## 2023-04-30 DIAGNOSIS — M1990 Unspecified osteoarthritis, unspecified site: Secondary | ICD-10-CM | POA: Diagnosis not present

## 2023-04-30 DIAGNOSIS — I2510 Atherosclerotic heart disease of native coronary artery without angina pectoris: Secondary | ICD-10-CM | POA: Diagnosis not present

## 2023-04-30 DIAGNOSIS — F419 Anxiety disorder, unspecified: Secondary | ICD-10-CM | POA: Diagnosis not present

## 2023-04-30 DIAGNOSIS — C8590 Non-Hodgkin lymphoma, unspecified, unspecified site: Secondary | ICD-10-CM | POA: Diagnosis not present

## 2023-05-02 DIAGNOSIS — J188 Other pneumonia, unspecified organism: Secondary | ICD-10-CM | POA: Diagnosis not present

## 2023-05-02 DIAGNOSIS — J449 Chronic obstructive pulmonary disease, unspecified: Secondary | ICD-10-CM | POA: Diagnosis not present

## 2023-05-02 DIAGNOSIS — M1990 Unspecified osteoarthritis, unspecified site: Secondary | ICD-10-CM | POA: Diagnosis not present

## 2023-05-02 DIAGNOSIS — Z9181 History of falling: Secondary | ICD-10-CM | POA: Diagnosis not present

## 2023-05-02 DIAGNOSIS — K922 Gastrointestinal hemorrhage, unspecified: Secondary | ICD-10-CM | POA: Diagnosis not present

## 2023-05-02 DIAGNOSIS — F17210 Nicotine dependence, cigarettes, uncomplicated: Secondary | ICD-10-CM | POA: Diagnosis not present

## 2023-05-02 DIAGNOSIS — J9601 Acute respiratory failure with hypoxia: Secondary | ICD-10-CM | POA: Diagnosis not present

## 2023-05-02 DIAGNOSIS — I480 Paroxysmal atrial fibrillation: Secondary | ICD-10-CM | POA: Diagnosis not present

## 2023-05-02 DIAGNOSIS — I10 Essential (primary) hypertension: Secondary | ICD-10-CM | POA: Diagnosis not present

## 2023-05-02 DIAGNOSIS — D6489 Other specified anemias: Secondary | ICD-10-CM | POA: Diagnosis not present

## 2023-05-02 DIAGNOSIS — I2510 Atherosclerotic heart disease of native coronary artery without angina pectoris: Secondary | ICD-10-CM | POA: Diagnosis not present

## 2023-05-02 DIAGNOSIS — Z7901 Long term (current) use of anticoagulants: Secondary | ICD-10-CM | POA: Diagnosis not present

## 2023-05-02 DIAGNOSIS — Z8616 Personal history of COVID-19: Secondary | ICD-10-CM | POA: Diagnosis not present

## 2023-05-02 DIAGNOSIS — F419 Anxiety disorder, unspecified: Secondary | ICD-10-CM | POA: Diagnosis not present

## 2023-05-02 DIAGNOSIS — F32A Depression, unspecified: Secondary | ICD-10-CM | POA: Diagnosis not present

## 2023-05-02 DIAGNOSIS — C8590 Non-Hodgkin lymphoma, unspecified, unspecified site: Secondary | ICD-10-CM | POA: Diagnosis not present

## 2023-05-08 DIAGNOSIS — J188 Other pneumonia, unspecified organism: Secondary | ICD-10-CM | POA: Diagnosis not present

## 2023-05-08 DIAGNOSIS — J9601 Acute respiratory failure with hypoxia: Secondary | ICD-10-CM | POA: Diagnosis not present

## 2023-05-08 DIAGNOSIS — F32A Depression, unspecified: Secondary | ICD-10-CM | POA: Diagnosis not present

## 2023-05-08 DIAGNOSIS — K922 Gastrointestinal hemorrhage, unspecified: Secondary | ICD-10-CM | POA: Diagnosis not present

## 2023-05-08 DIAGNOSIS — M1990 Unspecified osteoarthritis, unspecified site: Secondary | ICD-10-CM | POA: Diagnosis not present

## 2023-05-08 DIAGNOSIS — J449 Chronic obstructive pulmonary disease, unspecified: Secondary | ICD-10-CM | POA: Diagnosis not present

## 2023-05-08 DIAGNOSIS — F17210 Nicotine dependence, cigarettes, uncomplicated: Secondary | ICD-10-CM | POA: Diagnosis not present

## 2023-05-08 DIAGNOSIS — D6489 Other specified anemias: Secondary | ICD-10-CM | POA: Diagnosis not present

## 2023-05-08 DIAGNOSIS — I2510 Atherosclerotic heart disease of native coronary artery without angina pectoris: Secondary | ICD-10-CM | POA: Diagnosis not present

## 2023-05-08 DIAGNOSIS — I10 Essential (primary) hypertension: Secondary | ICD-10-CM | POA: Diagnosis not present

## 2023-05-08 DIAGNOSIS — Z8616 Personal history of COVID-19: Secondary | ICD-10-CM | POA: Diagnosis not present

## 2023-05-08 DIAGNOSIS — F419 Anxiety disorder, unspecified: Secondary | ICD-10-CM | POA: Diagnosis not present

## 2023-05-08 DIAGNOSIS — I480 Paroxysmal atrial fibrillation: Secondary | ICD-10-CM | POA: Diagnosis not present

## 2023-05-08 DIAGNOSIS — C8590 Non-Hodgkin lymphoma, unspecified, unspecified site: Secondary | ICD-10-CM | POA: Diagnosis not present

## 2023-05-08 DIAGNOSIS — Z9181 History of falling: Secondary | ICD-10-CM | POA: Diagnosis not present

## 2023-05-08 DIAGNOSIS — Z7901 Long term (current) use of anticoagulants: Secondary | ICD-10-CM | POA: Diagnosis not present

## 2023-05-09 ENCOUNTER — Other Ambulatory Visit: Payer: Medicare HMO

## 2023-05-14 ENCOUNTER — Other Ambulatory Visit (INDEPENDENT_AMBULATORY_CARE_PROVIDER_SITE_OTHER): Payer: Medicare HMO

## 2023-05-14 DIAGNOSIS — D649 Anemia, unspecified: Secondary | ICD-10-CM

## 2023-05-15 LAB — CBC WITH DIFFERENTIAL/PLATELET
Basophils Absolute: 0.1 10*3/uL (ref 0.0–0.1)
Basophils Relative: 1.5 % (ref 0.0–3.0)
Eosinophils Absolute: 0.2 10*3/uL (ref 0.0–0.7)
Eosinophils Relative: 2.7 % (ref 0.0–5.0)
HCT: 29.6 % — ABNORMAL LOW (ref 36.0–46.0)
Hemoglobin: 9 g/dL — ABNORMAL LOW (ref 12.0–15.0)
Lymphocytes Relative: 29.5 % (ref 12.0–46.0)
Lymphs Abs: 2.4 10*3/uL (ref 0.7–4.0)
MCHC: 30.5 g/dL (ref 30.0–36.0)
MCV: 77.3 fl — ABNORMAL LOW (ref 78.0–100.0)
Monocytes Absolute: 0.7 10*3/uL (ref 0.1–1.0)
Monocytes Relative: 8.9 % (ref 3.0–12.0)
Neutro Abs: 4.6 10*3/uL (ref 1.4–7.7)
Neutrophils Relative %: 57.4 % (ref 43.0–77.0)
Platelets: 255 10*3/uL (ref 150.0–400.0)
RBC: 3.83 Mil/uL — ABNORMAL LOW (ref 3.87–5.11)
RDW: 16.4 % — ABNORMAL HIGH (ref 11.5–15.5)
WBC: 8.1 10*3/uL (ref 4.0–10.5)

## 2023-05-15 LAB — B12 AND FOLATE PANEL
Folate: 9.4 ng/mL (ref >5.9)
Vitamin B-12: 217 pg/mL (ref 211–911)

## 2023-05-16 DIAGNOSIS — Z8616 Personal history of COVID-19: Secondary | ICD-10-CM | POA: Diagnosis not present

## 2023-05-16 DIAGNOSIS — Z7901 Long term (current) use of anticoagulants: Secondary | ICD-10-CM | POA: Diagnosis not present

## 2023-05-16 DIAGNOSIS — J449 Chronic obstructive pulmonary disease, unspecified: Secondary | ICD-10-CM | POA: Diagnosis not present

## 2023-05-16 DIAGNOSIS — F17210 Nicotine dependence, cigarettes, uncomplicated: Secondary | ICD-10-CM | POA: Diagnosis not present

## 2023-05-16 DIAGNOSIS — M1990 Unspecified osteoarthritis, unspecified site: Secondary | ICD-10-CM | POA: Diagnosis not present

## 2023-05-16 DIAGNOSIS — J9601 Acute respiratory failure with hypoxia: Secondary | ICD-10-CM | POA: Diagnosis not present

## 2023-05-16 DIAGNOSIS — J188 Other pneumonia, unspecified organism: Secondary | ICD-10-CM | POA: Diagnosis not present

## 2023-05-16 DIAGNOSIS — D6489 Other specified anemias: Secondary | ICD-10-CM | POA: Diagnosis not present

## 2023-05-16 DIAGNOSIS — I2510 Atherosclerotic heart disease of native coronary artery without angina pectoris: Secondary | ICD-10-CM | POA: Diagnosis not present

## 2023-05-16 DIAGNOSIS — I480 Paroxysmal atrial fibrillation: Secondary | ICD-10-CM | POA: Diagnosis not present

## 2023-05-16 DIAGNOSIS — K922 Gastrointestinal hemorrhage, unspecified: Secondary | ICD-10-CM | POA: Diagnosis not present

## 2023-05-16 DIAGNOSIS — I10 Essential (primary) hypertension: Secondary | ICD-10-CM | POA: Diagnosis not present

## 2023-05-16 DIAGNOSIS — F419 Anxiety disorder, unspecified: Secondary | ICD-10-CM | POA: Diagnosis not present

## 2023-05-16 DIAGNOSIS — Z9181 History of falling: Secondary | ICD-10-CM | POA: Diagnosis not present

## 2023-05-16 DIAGNOSIS — C8590 Non-Hodgkin lymphoma, unspecified, unspecified site: Secondary | ICD-10-CM | POA: Diagnosis not present

## 2023-05-16 DIAGNOSIS — F32A Depression, unspecified: Secondary | ICD-10-CM | POA: Diagnosis not present

## 2023-05-16 LAB — MULTIPLE MYELOMA PANEL, SERUM
Albumin SerPl Elph-Mcnc: 3 g/dL (ref 2.9–4.4)
Albumin/Glob SerPl: 1.1 (ref 0.7–1.7)
Alpha 1: 0.3 g/dL (ref 0.0–0.4)
Alpha2 Glob SerPl Elph-Mcnc: 0.8 g/dL (ref 0.4–1.0)
B-Globulin SerPl Elph-Mcnc: 0.9 g/dL (ref 0.7–1.3)
Gamma Glob SerPl Elph-Mcnc: 1 g/dL (ref 0.4–1.8)
Globulin, Total: 2.9 g/dL (ref 2.2–3.9)
IgA/Immunoglobulin A, Serum: 124 mg/dL (ref 64–422)
IgG (Immunoglobin G), Serum: 861 mg/dL (ref 586–1602)
IgM (Immunoglobulin M), Srm: 381 mg/dL — ABNORMAL HIGH (ref 26–217)
Total Protein: 5.9 g/dL — ABNORMAL LOW (ref 6.0–8.5)

## 2023-05-16 LAB — IRON,TIBC AND FERRITIN PANEL
%SAT: 7 % — ABNORMAL LOW (ref 16–45)
Ferritin: 5 ng/mL — ABNORMAL LOW (ref 16–288)
Iron: 27 ug/dL — ABNORMAL LOW (ref 45–160)
TIBC: 398 ug/dL (ref 250–450)

## 2023-05-16 LAB — ERYTHROPOIETIN: Erythropoietin: 20.8 m[IU]/mL — ABNORMAL HIGH (ref 2.6–18.5)

## 2023-05-20 DIAGNOSIS — F419 Anxiety disorder, unspecified: Secondary | ICD-10-CM | POA: Diagnosis not present

## 2023-05-20 DIAGNOSIS — F32A Depression, unspecified: Secondary | ICD-10-CM | POA: Diagnosis not present

## 2023-05-20 DIAGNOSIS — I480 Paroxysmal atrial fibrillation: Secondary | ICD-10-CM | POA: Diagnosis not present

## 2023-05-20 DIAGNOSIS — J9601 Acute respiratory failure with hypoxia: Secondary | ICD-10-CM | POA: Diagnosis not present

## 2023-05-20 DIAGNOSIS — I2510 Atherosclerotic heart disease of native coronary artery without angina pectoris: Secondary | ICD-10-CM | POA: Diagnosis not present

## 2023-05-20 DIAGNOSIS — F17210 Nicotine dependence, cigarettes, uncomplicated: Secondary | ICD-10-CM | POA: Diagnosis not present

## 2023-05-20 DIAGNOSIS — C8590 Non-Hodgkin lymphoma, unspecified, unspecified site: Secondary | ICD-10-CM | POA: Diagnosis not present

## 2023-05-20 DIAGNOSIS — Z9181 History of falling: Secondary | ICD-10-CM | POA: Diagnosis not present

## 2023-05-20 DIAGNOSIS — K922 Gastrointestinal hemorrhage, unspecified: Secondary | ICD-10-CM | POA: Diagnosis not present

## 2023-05-20 DIAGNOSIS — Z8616 Personal history of COVID-19: Secondary | ICD-10-CM | POA: Diagnosis not present

## 2023-05-20 DIAGNOSIS — J188 Other pneumonia, unspecified organism: Secondary | ICD-10-CM | POA: Diagnosis not present

## 2023-05-20 DIAGNOSIS — D6489 Other specified anemias: Secondary | ICD-10-CM | POA: Diagnosis not present

## 2023-05-20 DIAGNOSIS — J449 Chronic obstructive pulmonary disease, unspecified: Secondary | ICD-10-CM | POA: Diagnosis not present

## 2023-05-20 DIAGNOSIS — M1990 Unspecified osteoarthritis, unspecified site: Secondary | ICD-10-CM | POA: Diagnosis not present

## 2023-05-20 DIAGNOSIS — I10 Essential (primary) hypertension: Secondary | ICD-10-CM | POA: Diagnosis not present

## 2023-05-20 DIAGNOSIS — Z7901 Long term (current) use of anticoagulants: Secondary | ICD-10-CM | POA: Diagnosis not present

## 2023-05-22 ENCOUNTER — Encounter: Payer: Self-pay | Admitting: Family

## 2023-05-23 ENCOUNTER — Other Ambulatory Visit: Payer: Self-pay

## 2023-05-23 DIAGNOSIS — C8298 Follicular lymphoma, unspecified, lymph nodes of multiple sites: Secondary | ICD-10-CM

## 2023-05-24 DIAGNOSIS — L821 Other seborrheic keratosis: Secondary | ICD-10-CM | POA: Diagnosis not present

## 2023-05-24 DIAGNOSIS — D225 Melanocytic nevi of trunk: Secondary | ICD-10-CM | POA: Diagnosis not present

## 2023-05-24 DIAGNOSIS — D2262 Melanocytic nevi of left upper limb, including shoulder: Secondary | ICD-10-CM | POA: Diagnosis not present

## 2023-05-24 DIAGNOSIS — Z08 Encounter for follow-up examination after completed treatment for malignant neoplasm: Secondary | ICD-10-CM | POA: Diagnosis not present

## 2023-05-24 DIAGNOSIS — D2239 Melanocytic nevi of other parts of face: Secondary | ICD-10-CM | POA: Diagnosis not present

## 2023-05-24 DIAGNOSIS — D2261 Melanocytic nevi of right upper limb, including shoulder: Secondary | ICD-10-CM | POA: Diagnosis not present

## 2023-05-24 DIAGNOSIS — Z85828 Personal history of other malignant neoplasm of skin: Secondary | ICD-10-CM | POA: Diagnosis not present

## 2023-05-27 ENCOUNTER — Telehealth: Admitting: Pharmacy Technician

## 2023-05-27 ENCOUNTER — Other Ambulatory Visit: Payer: Self-pay | Admitting: Hematology

## 2023-05-27 ENCOUNTER — Inpatient Hospital Stay (HOSPITAL_BASED_OUTPATIENT_CLINIC_OR_DEPARTMENT_OTHER): Payer: Medicare PPO | Admitting: Hematology

## 2023-05-27 ENCOUNTER — Inpatient Hospital Stay: Payer: Medicare PPO | Attending: Hematology

## 2023-05-27 VITALS — BP 137/51 | HR 50 | Temp 97.6°F | Resp 16 | Ht 69.0 in | Wt 191.2 lb

## 2023-05-27 DIAGNOSIS — I4891 Unspecified atrial fibrillation: Secondary | ICD-10-CM | POA: Insufficient documentation

## 2023-05-27 DIAGNOSIS — D509 Iron deficiency anemia, unspecified: Secondary | ICD-10-CM | POA: Insufficient documentation

## 2023-05-27 DIAGNOSIS — C8298 Follicular lymphoma, unspecified, lymph nodes of multiple sites: Secondary | ICD-10-CM | POA: Diagnosis not present

## 2023-05-27 DIAGNOSIS — M4306 Spondylolysis, lumbar region: Secondary | ICD-10-CM | POA: Diagnosis not present

## 2023-05-27 DIAGNOSIS — C8293 Follicular lymphoma, unspecified, intra-abdominal lymph nodes: Secondary | ICD-10-CM | POA: Insufficient documentation

## 2023-05-27 DIAGNOSIS — Z7901 Long term (current) use of anticoagulants: Secondary | ICD-10-CM | POA: Insufficient documentation

## 2023-05-27 DIAGNOSIS — Z8616 Personal history of COVID-19: Secondary | ICD-10-CM | POA: Diagnosis not present

## 2023-05-27 DIAGNOSIS — F17210 Nicotine dependence, cigarettes, uncomplicated: Secondary | ICD-10-CM | POA: Diagnosis not present

## 2023-05-27 DIAGNOSIS — R195 Other fecal abnormalities: Secondary | ICD-10-CM | POA: Insufficient documentation

## 2023-05-27 DIAGNOSIS — M9903 Segmental and somatic dysfunction of lumbar region: Secondary | ICD-10-CM | POA: Diagnosis not present

## 2023-05-27 DIAGNOSIS — M9901 Segmental and somatic dysfunction of cervical region: Secondary | ICD-10-CM | POA: Diagnosis not present

## 2023-05-27 DIAGNOSIS — M542 Cervicalgia: Secondary | ICD-10-CM | POA: Diagnosis not present

## 2023-05-27 DIAGNOSIS — D500 Iron deficiency anemia secondary to blood loss (chronic): Secondary | ICD-10-CM

## 2023-05-27 LAB — CBC WITH DIFFERENTIAL (CANCER CENTER ONLY)
Abs Immature Granulocytes: 0.04 10*3/uL (ref 0.00–0.07)
Basophils Absolute: 0.1 10*3/uL (ref 0.0–0.1)
Basophils Relative: 1 %
Eosinophils Absolute: 0.2 10*3/uL (ref 0.0–0.5)
Eosinophils Relative: 2 %
HCT: 31.1 % — ABNORMAL LOW (ref 36.0–46.0)
Hemoglobin: 9.2 g/dL — ABNORMAL LOW (ref 12.0–15.0)
Immature Granulocytes: 0 %
Lymphocytes Relative: 24 %
Lymphs Abs: 2.3 10*3/uL (ref 0.7–4.0)
MCH: 23.5 pg — ABNORMAL LOW (ref 26.0–34.0)
MCHC: 29.6 g/dL — ABNORMAL LOW (ref 30.0–36.0)
MCV: 79.3 fL — ABNORMAL LOW (ref 80.0–100.0)
Monocytes Absolute: 0.8 10*3/uL (ref 0.1–1.0)
Monocytes Relative: 8 %
Neutro Abs: 5.9 10*3/uL (ref 1.7–7.7)
Neutrophils Relative %: 65 %
Platelet Count: 303 10*3/uL (ref 150–400)
RBC: 3.92 MIL/uL (ref 3.87–5.11)
RDW: 15.9 % — ABNORMAL HIGH (ref 11.5–15.5)
WBC Count: 9.3 10*3/uL (ref 4.0–10.5)
nRBC: 0 % (ref 0.0–0.2)

## 2023-05-27 LAB — CMP (CANCER CENTER ONLY)
ALT: 8 U/L (ref 0–44)
AST: 16 U/L (ref 15–41)
Albumin: 3.6 g/dL (ref 3.5–5.0)
Alkaline Phosphatase: 60 U/L (ref 38–126)
Anion gap: 6 (ref 5–15)
BUN: 15 mg/dL (ref 8–23)
CO2: 30 mmol/L (ref 22–32)
Calcium: 8.5 mg/dL — ABNORMAL LOW (ref 8.9–10.3)
Chloride: 106 mmol/L (ref 98–111)
Creatinine: 0.95 mg/dL (ref 0.44–1.00)
GFR, Estimated: >60 mL/min (ref >60)
Glucose, Bld: 122 mg/dL — ABNORMAL HIGH (ref 70–99)
Potassium: 4 mmol/L (ref 3.5–5.1)
Sodium: 142 mmol/L (ref 135–145)
Total Bilirubin: 0.3 mg/dL (ref 0.0–1.2)
Total Protein: 6.4 g/dL — ABNORMAL LOW (ref 6.5–8.1)

## 2023-05-27 LAB — T4, FREE: Free T4: 0.95 ng/dL (ref 0.61–1.12)

## 2023-05-27 LAB — LACTATE DEHYDROGENASE: LDH: 171 U/L (ref 98–192)

## 2023-05-27 LAB — TSH: TSH: 4.075 u[IU]/mL (ref 0.350–4.500)

## 2023-05-27 MED ORDER — B COMPLEX VITAMINS PO CAPS: 1.0000 | ORAL_CAPSULE | Freq: Every day | ORAL | 5 refills | Status: AC

## 2023-05-27 MED ORDER — B-12 1000 MCG SL SUBL: 1000.0000 ug | SUBLINGUAL_TABLET | Freq: Every day | SUBLINGUAL | 11 refills | Status: DC

## 2023-05-27 NOTE — Telephone Encounter (Signed)
 Feraheme is non preferred and will be denied if patient has not failed preferred medication. Preferred medication is Venofer.  Would you like to use Venofer??  Auth Submission: NO AUTH NEEDED Site of care: Site of care: [facility] Payer: [employer] medicare Medication & CPT/J Code(s) submitted: Venofer (Iron Sucrose) J1756 Route of submission (phone, fax, portal):  Phone # Fax # Auth type: Buy/Bill PB Units/visits requested: 5 doses Reference number:  Approval from: 05/27/23 to 10/27/23

## 2023-05-27 NOTE — Progress Notes (Signed)
 HEMATOLOGY/ONCOLOGY CLINIC NOTE  Date of Service: 05/27/2023  Patient Care Team: Allegra Grana, FNP as PCP - General (Family Medicine) Mariah Milling Tollie Pizza, MD as PCP - Cardiology (Cardiology)  CHIEF COMPLAINTS/PURPOSE OF CONSULTATION:  Recurrent lymphoma based CT chest and Abdomen   HISTORY OF PRESENTING ILLNESS:   Samantha [name] is a wonderful 83 y.o. female who has been referred to [place] by Dr. Rennie Plowman for evaluation and management of recurrence of lymphoma. Patient was having excessive night sweats and saw her PCP. She had an CT chest and abdomen on 10/20/2022 which shows concerns for recurrence.   Patient previously used to follow-up with me for follicular lymphoma of lymph nodes of multiple regions and was last seen by me on 07/24/2019. Patient was suppose to follow-up with [place], but did not follow-up. Patient was treated with 5 cycles of Bendamustine and Rituxan, with her last treatment in June 2018. PET scan and CT scans after 2018 did not show any disease progression.   Patient is accompanied by her son during this visit. She notes she has been doing well overall for the past 6 months. She notes she has been having drenching night sweats for around 1-2 months. She denies any recent infections or any respiratory symptoms. Patient notes she is not been having night sweats for the past 2 weeks.   Patient notes she has been taking Gabapentin for a long time and has been tolerating it well without any new or severe toxicities.   She denies any recent steroid usage or any other medicine changes in the past 6 months. She is complaint with all of her medicine. She denies taking any allergy medication.   She denies any new infection issues, fever, chills, unexpected weight loss, chest pain, abdominal pain, back pain, or leg swelling. She does complain of mild SOB and mild occasional fatigue. Patient complains of more frequent bathroom usage, but denies diarrhea. She denies black stool  or blood in stool.   During the physical examination, patient complained of mild lower left abdominal discomfort. Denies pain.   Patient notes she used to have hot flashes and went to her PCP. She was prescribed Paxil 10 mg which has improved her hot flashes and nightsweats. She denies hot flashes during this visit.    Patient notes she smokes little less than half pack a day.   She reports of mild change in appetite, but denies any weight loss.   INTERVAL HISTORY:  Samantha [name] is a wonderful 83 y.o. female who is here for continued evaluation and management of recurrence of lymphoma. Patient was initially seen by me on 11/27/2022.   Patient is accompanied by her son during this visit. Patient notes she has been doing well overall since our last visit. She does complain of mild fatigue, but denies any abnormal bleeding. However, she complains of occasional black stool, couple times a month. Denies taking Pepto-bismol, iron supplement, or any other OTC medication.   Patient denies taking Protonix 40 mg. She used to take Protonix in the past.   She notes that her last colonoscopy was in 2012 at [facility] in [place].   She denies any new infection issues, fever, chills, night sweats, unexpected weight loss, back pain, SOB, chest pain, or leg swelling. She notes that her night sweats are stable with Paxil.  She currently smokes 2-3 cigarettes a day.   MEDICAL HISTORY:  Past Medical History:  Diagnosis Date   A-fib (HCC)  Arthritis    Chronic back pain    COPD (chronic obstructive pulmonary disease) (HCC)    Coronary artery disease    COVID-19    08/22/20 likely contracted late 07/2020   Glaucoma    Hearing aid worn    High cholesterol    Hypertension    Lymphadenopathy 01/25/2016   Myocardial infarction (HCC)    2000   Non Hodgkin's lymphoma (HCC)    Wears dentures    " Top plate"   Wears glasses     SURGICAL HISTORY: Past Surgical History:   Procedure Laterality Date   CARDIAC CATHETERIZATION     CATARACT EXTRACTION W/ INTRAOCULAR LENS IMPLANT     CHOLECYSTECTOMY     COLONOSCOPY W/ BIOPSIES AND POLYPECTOMY     CORONARY STENT PLACEMENT     INGUINAL LYMPH NODE BIOPSY Right 01/25/2016   Procedure: EXCISION DEEP RIGHT INGUINAL LYMPH NODE BIOPSY WITH ULTRA SOUND;  Surgeon: Claud Kelp, MD;  Location: [facility] OR;  Service: General;  Laterality: Right;   SPINE SURGERY      SOCIAL HISTORY: Social History   Socioeconomic History   Marital status: Single    Spouse name: Not on file   Number of children: Not on file   Years of education: Not on file   Highest education level: GED or equivalent  Occupational History   Not on file  Tobacco Use   Smoking status: Every Day    Current packs/day: 0.50    Average packs/day: 0.5 packs/day for 60.0 years (30.0 ttl pk-yrs)    Types: Cigarettes   Smokeless tobacco: Never   Tobacco comments:    3-4 cigs daily--04/23/2023  Vaping Use   Vaping status: Never Used  Substance and Sexual Activity   Alcohol use: Yes    Comment: social   Drug use: No   Sexual activity: Not Currently  Other Topics Concern   Not on file  Social History Narrative   Not on file   Social Drivers of Health   Financial Resource Strain: Low Risk  (04/02/2023)   Overall Financial Resource Strain (CARDIA)    Difficulty of Paying Living Expenses: Not hard at all  Food Insecurity: No Food Insecurity (04/11/2023)   Hunger Vital Sign    Worried About Running Out of Food in the Last Year: Never true    Ran Out of Food in the Last Year: Never true  Transportation Needs: No Transportation Needs (04/11/2023)   PRAPARE - Administrator, Civil Service (Medical): No    Lack of Transportation (Non-Medical): No  Physical Activity: Inactive (04/02/2023)   Exercise Vital Sign    Days of Exercise per Week: 0 days    Minutes of Exercise per Session: 0 min  Stress: No Stress Concern Present (04/02/2023)   [employer] of Occupational Health - Occupational Stress Questionnaire    Feeling of Stress : Not at all  Social Connections: Moderately Isolated (04/11/2023)   Social Connection and Isolation Panel [NHANES]    Frequency of Communication with Friends and Family: More than three times a week    Frequency of Social Gatherings with Friends and Family: Three times a week    Attends Religious Services: Never    Active Member of Clubs or Organizations: Yes    Attends Banker Meetings: More than 4 times per year    Marital Status: Widowed  Intimate Partner Violence: Not At Risk (04/11/2023)   Humiliation, Afraid, Rape, and Kick questionnaire    Fear  of Current or Ex-Partner: No    Emotionally Abused: No    Physically Abused: No    Sexually Abused: No    FAMILY HISTORY: Family History  Problem Relation Age of Onset   Hypertension Mother    Stroke Mother    Heart disease Father    Heart disease Other     ALLERGIES:  is allergic to hydrocodone and oxycodone.  MEDICATIONS:  Current Outpatient Medications  Medication Sig Dispense Refill   ALPRAZolam (XANAX) 0.25 MG tablet TAKE 1 TABLET EVERY DAY AS NEEDED FOR ANXIETY 30 tablet 2   amiodarone (PACERONE) 200 MG tablet TAKE 1/2 TABLET EVERY MORNING 45 tablet 3   amLODipine (NORVASC) 2.5 MG tablet Take 1 tablet (2.5 mg total) by mouth daily. 90 tablet 3   apixaban (ELIQUIS) 5 MG TABS tablet Take 1 tablet (5 mg total) by mouth 2 (two) times daily. 180 tablet 1   atorvastatin (LIPITOR) 40 MG tablet Take 1 tablet (40 mg total) by mouth daily. *NEED OFFICE VISIT* TAKE 1 TABLET EVERY DAY 90 tablet 3   Cholecalciferol 1.25 MG (50000 UT) TABS 50,000 units PO qwk for 8 weeks. 8 tablet 0   fluticasone furoate-vilanterol (BREO ELLIPTA) 100-25 MCG/ACT AEPB Inhale 1 puff into the lungs daily. 90 each 3   gabapentin (NEURONTIN) 100 MG capsule Take 2 capsules (200 mg total) by mouth 2 (two) times daily.     metoprolol succinate (TOPROL-XL) 50 MG  24 hr tablet Take 1 tablet (50 mg total) by mouth daily. 90 tablet 3   nitroGLYCERIN (NITROSTAT) 0.4 MG SL tablet Place 1 tablet (0.4 mg total) under the tongue every 5 (five) minutes x 3 doses as needed for chest pain. 25 tablet 0   pantoprazole (PROTONIX) 40 MG tablet Take 1 tablet (40 mg total) by mouth daily before breakfast. 30 tablet 1   PARoxetine (PAXIL) 10 MG tablet Take 1 tablet (10 mg total) by mouth daily. 90 tablet 3   No current facility-administered medications for this visit.    REVIEW OF SYSTEMS:    10 Point review of Systems was done is negative except as noted above.  PHYSICAL EXAMINATION: ECOG PERFORMANCE STATUS: 2 - Symptomatic, <50% confined to bed  . Vitals:   05/27/23 1040  BP: (!) 137/51  Pulse: (!) 50  Resp: 16  Temp: 97.6 F (36.4 C)  SpO2: 100%   Filed Weights   05/27/23 1040  Weight: 191 lb 3.2 oz (86.7 kg)   .Body mass index is 28.24 kg/m. GENERAL:alert, in no acute distress and comfortable SKIN: no acute rashes, no significant lesions EYES: conjunctiva are pink and non-injected, sclera anicteric OROPHARYNX: MMM, no exudates, no oropharyngeal erythema or ulceration NECK: supple, no JVD LYMPH:  no palpable lymphadenopathy in the cervical, axillary or inguinal regions LUNGS: clear to auscultation b/l with normal respiratory effort HEART: regular rate & rhythm ABDOMEN:  normoactive bowel sounds , non tender, not distended. Extremity: no pedal edema PSYCH: alert & oriented x 3 with fluent speech NEURO: no focal motor/sensory deficits  LABORATORY DATA:  I have reviewed the data as listed  .    Latest Ref Rng & Units 05/27/2023   10:15 AM 05/14/2023    2:34 PM 04/10/2023    5:25 AM  CBC  WBC 4.0 - 10.5 K/uL 9.3  8.1  6.3   Hemoglobin 12.0 - 15.0 g/dL 9.2  9.0  8.4   Hematocrit 36.0 - 46.0 % 31.1  29.6  29.4   Platelets 150 -  400 K/uL 303  255.0  229     .    Latest Ref Rng & Units 05/27/2023   10:15 AM 05/14/2023    2:34 PM 04/10/2023     5:25 AM  CMP  Glucose 70 - 99 mg/dL 098   90   BUN 8 - 23 mg/dL 15   9   Creatinine 1.19 - 1.00 mg/dL 1.47   8.29   Sodium 562 - 145 mmol/L 142   142   Potassium 3.5 - 5.1 mmol/L 4.0   4.0   Chloride 98 - 111 mmol/L 106   110   CO2 22 - 32 mmol/L 30   26   Calcium 8.9 - 10.3 mg/dL 8.5   7.9   Total Protein 6.5 - 8.1 g/dL 6.4  5.9    Total Bilirubin 0.0 - 1.2 mg/dL 0.3     Alkaline Phos 38 - 126 U/L 60     AST 15 - 41 U/L 16     ALT 0 - 44 U/L 8        RADIOGRAPHIC STUDIES: I have personally reviewed the radiological images as listed and agreed with the findings in the report. No results found.  ASSESSMENT & PLAN:   83 y.o. Caucasian female with multiple chronic medical comorbidities as noted above with    #1 h/o Stage IVB Low grade Follicular Lymphoma diagnosed in 2018 with extensive retroperitoneal lymphadenopathy and right inguinal lymphadenopathy as well as osseous involvement of the sacrum. LDH level within normal limits.  S/p 5 cycles of BR (Bendamustine 70mg /m2)   PET CT scan after 5 cycles of Bendamustine and Rituxan done on 09/06/2016 shows no residual metabolic active disease and no evidence of disease progression.   CT C/A/P on 05/20/17 reveals no radiographic evidence of disease progression.    02/19/18 CT C/A/P revealed Stable mild retroperitoneal adenopathy, compatible with treated lymphoma. No new or progressive adenopathy. No new sites of disease. Normal size spleen. 2. Three-vessel coronary atherosclerosis. 3. Aortic Atherosclerosis.  PLAN: -Discussed lab results from 05/14/2023 in detail with the patient. CBC shows patient is anemic with low Hgb of 9.0 g/dL with Hct of 13.0%. Cmp stable. Iron-deficiency with low iron saturation of 7%. Slightly low Vitamin B-12 level of 217. Elevated erythropoietin level of 20.8.  -Multiple myeloma panel results from 05/14/2023 did not show M-protein.  -Discussed with the patient that since she has black stool, it looks like she  is having Upper GI bleeding.  -Recommend the patient to follow-up with PCP and Cardiologist regarding her blood thinner dosage. Possibly to cut-down her current Eliquis dosage to half due to GI  bleeding.  -Recommend to follow-up with Gastroenterologist regarding black stools. She will call [facility] to schedule.  -Recommend IV Iron infusion and sublingual Vitamin B-12 and B-complex. Pt agrees with the plan.  -Answered all of patient's questions.   FOLLOW-UP: -IV feraheme 510mg  weekly x 2 doses @ [facility] -Patient to call PCP/cardiology regarding recommendation on Anticoagulation in the setting of possible GI losses -refer to [facility] (patient was previous patient there).. concerns for slow GI losses with intermittent melanotic stools. RTC with Dr Candise Che in 2 months   The total time spent in the appointment was 32 minutes* .  All of the patient's questions were answered with apparent satisfaction. The patient knows to call the clinic with any problems, questions or concerns.   Wyvonnia Lora MD MS AAHIVMS [facility] Hematology/Oncology Physician [facility]  .*  Total Encounter Time as defined by the Centers for Medicare and Medicaid Services includes, in addition to the face-to-face time of a patient visit (documented in the note above) non-face-to-face time: obtaining and reviewing outside history, ordering and reviewing medications, tests or procedures, care coordination (communications with other health care professionals or caregivers) and documentation in the medical record.   I,Param Shah,acting as a Neurosurgeon for Wyvonnia Lora, MD.,have documented all relevant documentation on the behalf of Wyvonnia Lora, MD,as directed by  Wyvonnia Lora, MD while in the presence of Wyvonnia Lora, MD.   .I have reviewed the above documentation for accuracy and completeness, and I agree with the above. Johney Maine MD

## 2023-05-28 ENCOUNTER — Telehealth: Payer: Self-pay | Admitting: Hematology

## 2023-05-28 ENCOUNTER — Other Ambulatory Visit: Payer: Self-pay | Admitting: Hematology

## 2023-05-28 ENCOUNTER — Encounter: Payer: Self-pay | Admitting: Hematology

## 2023-05-28 NOTE — Telephone Encounter (Signed)
 Per scheduling order on 05/27/2023 patient is aware of scheduled appointment times/dates

## 2023-05-29 ENCOUNTER — Other Ambulatory Visit: Payer: Self-pay | Admitting: Family

## 2023-05-29 DIAGNOSIS — D500 Iron deficiency anemia secondary to blood loss (chronic): Secondary | ICD-10-CM

## 2023-05-30 ENCOUNTER — Other Ambulatory Visit: Payer: Self-pay | Admitting: Family

## 2023-05-30 DIAGNOSIS — D500 Iron deficiency anemia secondary to blood loss (chronic): Secondary | ICD-10-CM

## 2023-06-02 ENCOUNTER — Encounter: Payer: Self-pay | Admitting: Hematology

## 2023-06-03 ENCOUNTER — Ambulatory Visit (INDEPENDENT_AMBULATORY_CARE_PROVIDER_SITE_OTHER)

## 2023-06-03 VITALS — BP 154/85 | HR 51 | Temp 98.3°F | Resp 18 | Ht 69.0 in | Wt 192.0 lb

## 2023-06-03 DIAGNOSIS — K922 Gastrointestinal hemorrhage, unspecified: Secondary | ICD-10-CM | POA: Diagnosis not present

## 2023-06-03 DIAGNOSIS — D500 Iron deficiency anemia secondary to blood loss (chronic): Secondary | ICD-10-CM | POA: Diagnosis not present

## 2023-06-03 MED ORDER — IRON SUCROSE 20 MG/ML IV SOLN
200.0000 mg | Freq: Once | INTRAVENOUS | Status: AC
Start: 2023-06-03 — End: 2023-06-03
  Administered 2023-06-03: 200 mg via INTRAVENOUS
  Filled 2023-06-03: qty 10

## 2023-06-03 NOTE — Progress Notes (Signed)
 Diagnosis: Iron Deficiency Anemia  Provider:  Chilton Greathouse MD  Procedure: IV Push  IV Type: Peripheral, IV Location: R Antecubital  Venofer (Iron Sucrose), Dose: 200 mg  Post Infusion IV Care: Observation period completed and Peripheral IV Discontinued  Discharge: Condition: Good, Destination: Home . AVS Provided  Performed by:  Loney Hering, LPN

## 2023-06-04 ENCOUNTER — Ambulatory Visit
Admission: RE | Admit: 2023-06-04 | Discharge: 2023-06-04 | Disposition: A | Discharge status: Home / Self Care | Payer: Medicare HMO | Source: Ambulatory Visit | Attending: Pulmonary Disease | Admitting: Pulmonary Disease

## 2023-06-04 DIAGNOSIS — R918 Other nonspecific abnormal finding of lung field: Secondary | ICD-10-CM | POA: Diagnosis not present

## 2023-06-04 DIAGNOSIS — J4489 Other specified chronic obstructive pulmonary disease: Secondary | ICD-10-CM | POA: Insufficient documentation

## 2023-06-04 DIAGNOSIS — J439 Emphysema, unspecified: Secondary | ICD-10-CM | POA: Insufficient documentation

## 2023-06-04 DIAGNOSIS — J449 Chronic obstructive pulmonary disease, unspecified: Secondary | ICD-10-CM | POA: Diagnosis not present

## 2023-06-05 ENCOUNTER — Other Ambulatory Visit: Payer: Self-pay | Admitting: Cardiovascular Disease

## 2023-06-05 ENCOUNTER — Ambulatory Visit

## 2023-06-05 VITALS — BP 167/72 | HR 50 | Temp 97.7°F | Resp 18 | Ht 69.0 in | Wt 190.4 lb

## 2023-06-05 DIAGNOSIS — D500 Iron deficiency anemia secondary to blood loss (chronic): Secondary | ICD-10-CM

## 2023-06-05 DIAGNOSIS — K922 Gastrointestinal hemorrhage, unspecified: Secondary | ICD-10-CM

## 2023-06-05 DIAGNOSIS — I480 Paroxysmal atrial fibrillation: Secondary | ICD-10-CM

## 2023-06-05 MED ORDER — IRON SUCROSE 20 MG/ML IV SOLN
200.0000 mg | Freq: Once | INTRAVENOUS | Status: AC
  Administered 2023-06-05: 200 mg via INTRAVENOUS
  Filled 2023-06-05: qty 10

## 2023-06-05 NOTE — Progress Notes (Signed)
 Diagnosis: Iron Deficiency Anemia  Provider:  Chilton Greathouse MD  Procedure: IV Push  IV Type: Peripheral, IV Location: L Antecubital  Venofer (Iron Sucrose), Dose: 200 mg  Post Infusion IV Care: Observation period completed and Peripheral IV Discontinued  Discharge: Condition: Good, Destination: Home . AVS Declined  Performed by:  Garnette Czech, RN

## 2023-06-05 NOTE — Telephone Encounter (Signed)
 Prescription refill request for Eliquis received. Indication: PAF Last office visit: 10/29/22  T Gollan mD Scr: 0.95 on 05/27/23  Epic Age: 83 Weight: 87.6kg  Based on above findings Eliquis 5mg  twice daily is the appropriate dose.  Refill approved.

## 2023-06-07 ENCOUNTER — Ambulatory Visit

## 2023-06-07 VITALS — BP 160/67 | HR 52 | Temp 98.0°F | Resp 18 | Ht 69.0 in | Wt 192.2 lb

## 2023-06-07 DIAGNOSIS — K922 Gastrointestinal hemorrhage, unspecified: Secondary | ICD-10-CM | POA: Diagnosis not present

## 2023-06-07 DIAGNOSIS — D500 Iron deficiency anemia secondary to blood loss (chronic): Secondary | ICD-10-CM

## 2023-06-07 MED ORDER — IRON SUCROSE 20 MG/ML IV SOLN
200.0000 mg | Freq: Once | INTRAVENOUS | Status: AC
  Administered 2023-06-07: 200 mg via INTRAVENOUS
  Filled 2023-06-07: qty 10

## 2023-06-07 NOTE — Progress Notes (Signed)
 Diagnosis: Iron Deficiency Anemia  Provider:  Chilton Greathouse MD  Procedure: IV Push  IV Type: Peripheral, IV Location: R Antecubital  Venofer (Iron Sucrose), Dose: 200 mg  Post Infusion IV Care: Observation period completed  Discharge: Condition: Good, Destination: Home . AVS Declined  Performed by:  Adriana Mccallum, RN

## 2023-06-10 ENCOUNTER — Ambulatory Visit (INDEPENDENT_AMBULATORY_CARE_PROVIDER_SITE_OTHER)

## 2023-06-10 VITALS — BP 153/64 | HR 50 | Temp 98.4°F | Resp 18 | Ht 69.0 in | Wt 190.2 lb

## 2023-06-10 DIAGNOSIS — H40153 Residual stage of open-angle glaucoma, bilateral: Secondary | ICD-10-CM | POA: Diagnosis not present

## 2023-06-10 DIAGNOSIS — K922 Gastrointestinal hemorrhage, unspecified: Secondary | ICD-10-CM | POA: Diagnosis not present

## 2023-06-10 DIAGNOSIS — D500 Iron deficiency anemia secondary to blood loss (chronic): Secondary | ICD-10-CM | POA: Diagnosis not present

## 2023-06-10 MED ORDER — IRON SUCROSE 20 MG/ML IV SOLN
200.0000 mg | Freq: Once | INTRAVENOUS | Status: AC
  Administered 2023-06-10: 200 mg via INTRAVENOUS
  Filled 2023-06-10: qty 10

## 2023-06-10 NOTE — Progress Notes (Signed)
 Diagnosis: Iron Deficiency Anemia  Provider:  Chilton Greathouse MD  Procedure: IV Push  IV Type: Peripheral, IV Location: R Antecubital  Venofer (Iron Sucrose), Dose: 200 mg  Post Infusion IV Care: Observation period completed and Peripheral IV Discontinued  Discharge: Condition: Good, Destination: Home . AVS Declined  Performed by:  Adriana Mccallum, RN

## 2023-06-12 ENCOUNTER — Ambulatory Visit

## 2023-06-12 VITALS — BP 149/71 | HR 52 | Temp 98.0°F | Resp 16 | Ht 69.0 in | Wt 189.8 lb

## 2023-06-12 DIAGNOSIS — D500 Iron deficiency anemia secondary to blood loss (chronic): Secondary | ICD-10-CM | POA: Diagnosis not present

## 2023-06-12 DIAGNOSIS — K922 Gastrointestinal hemorrhage, unspecified: Secondary | ICD-10-CM | POA: Diagnosis not present

## 2023-06-12 MED ORDER — IRON SUCROSE 20 MG/ML IV SOLN
200.0000 mg | Freq: Once | INTRAVENOUS | Status: AC
  Administered 2023-06-12: 200 mg via INTRAVENOUS
  Filled 2023-06-12: qty 10

## 2023-06-12 NOTE — Progress Notes (Signed)
 Diagnosis: Iron Deficiency Anemia  Provider:  Chilton Greathouse MD  Procedure: IV Push  IV Type: Peripheral, IV Location: L Forearm  Venofer (Iron Sucrose), Dose: 200 mg  Post Infusion IV Care: Observation period completed and Peripheral IV Discontinued  Discharge: Condition: Good, Destination: Home . AVS Declined  Performed by:  Garnette Czech, RN

## 2023-06-17 ENCOUNTER — Encounter: Payer: Self-pay | Admitting: Pulmonary Disease

## 2023-07-06 ENCOUNTER — Other Ambulatory Visit: Payer: Self-pay | Admitting: Family

## 2023-07-06 DIAGNOSIS — M79672 Pain in left foot: Secondary | ICD-10-CM

## 2023-07-17 ENCOUNTER — Other Ambulatory Visit: Payer: Self-pay

## 2023-07-17 ENCOUNTER — Encounter: Payer: Self-pay | Admitting: Family

## 2023-07-17 ENCOUNTER — Ambulatory Visit
Admission: RE | Admit: 2023-07-17 | Discharge: 2023-07-17 | Disposition: A | Discharge status: Home / Self Care | Source: Ambulatory Visit | Attending: Family | Admitting: Family

## 2023-07-17 ENCOUNTER — Ambulatory Visit (INDEPENDENT_AMBULATORY_CARE_PROVIDER_SITE_OTHER): Admitting: Family

## 2023-07-17 VITALS — BP 134/50 | HR 54 | Temp 98.4°F | Ht 73.0 in | Wt 182.6 lb

## 2023-07-17 DIAGNOSIS — R1010 Upper abdominal pain, unspecified: Secondary | ICD-10-CM | POA: Diagnosis not present

## 2023-07-17 DIAGNOSIS — I10 Essential (primary) hypertension: Secondary | ICD-10-CM

## 2023-07-17 DIAGNOSIS — I700 Atherosclerosis of aorta: Secondary | ICD-10-CM | POA: Diagnosis not present

## 2023-07-17 DIAGNOSIS — J40 Bronchitis, not specified as acute or chronic: Secondary | ICD-10-CM | POA: Diagnosis not present

## 2023-07-17 DIAGNOSIS — R109 Unspecified abdominal pain: Secondary | ICD-10-CM | POA: Insufficient documentation

## 2023-07-17 DIAGNOSIS — I480 Paroxysmal atrial fibrillation: Secondary | ICD-10-CM | POA: Diagnosis not present

## 2023-07-17 DIAGNOSIS — D500 Iron deficiency anemia secondary to blood loss (chronic): Secondary | ICD-10-CM

## 2023-07-17 DIAGNOSIS — R071 Chest pain on breathing: Secondary | ICD-10-CM | POA: Diagnosis not present

## 2023-07-17 MED ORDER — AMLODIPINE BESYLATE 2.5 MG PO TABS: 2.5000 mg | ORAL_TABLET | Freq: Every day | ORAL | 3 refills | Status: AC

## 2023-07-17 MED ORDER — METOPROLOL SUCCINATE ER 50 MG PO TB24: 25.0000 mg | ORAL_TABLET | Freq: Every day | ORAL | 3 refills | Status: DC

## 2023-07-17 NOTE — Progress Notes (Signed)
 Assessment & Plan:  Pain of upper abdomen Assessment & Plan: Reassuring exam. Patient nontoxic in appearance. Able to reproduce bilateral upper abdominal pain with deep inspiration and palpation. Non exquisite. Obtained EKG d/t epigastric pain which showed sinus bradycardia. Artifact from spinal nerve stimulator. When compared to prior,  EKG 04/08/23 , no significant changes.   Question if GERD, constipation playing a role. Start pepcid OTC. Pending CXR, Ab XR.   EKG and case reviewed with supervising, Dr Creta Dolin, and she and I jointly agreed on management plan.    Orders: -     Comprehensive metabolic panel with GFR -     EKG 12-Lead -     DG Abd 2 Views; Future -     DG Chest 2 View; Future  Primary hypertension -     amLODIPine  Besylate; Take 1 tablet (2.5 mg total) by mouth daily.  Dispense: 90 tablet; Refill: 3 -     Metoprolol  Succinate ER; Take 0.5 tablets (25 mg total) by mouth daily.  Dispense: 90 tablet; Refill: 3  Atherosclerosis of aorta (HCC) -     Metoprolol  Succinate ER; Take 0.5 tablets (25 mg total) by mouth daily.  Dispense: 90 tablet; Refill: 3  Intermittent atrial fibrillation (HCC) Assessment & Plan: Chronic, symptomatically stable. Decreased toprol  from 50mg  to 25mg  d/t bradycardia. Continue eliquis  5mg  BID, amiodarone  as managed by Dr Gollan.       Return precautions given.   Risks, benefits, and alternatives of the medications and treatment plan prescribed today were discussed, and patient expressed understanding.   Education regarding symptom management and diagnosis given to patient on AVS either electronically or printed.  Return in about 6 weeks (around 08/28/2023).  Bascom Bossier, FNP  Subjective:    Patient ID: Samantha [name], female    DOB: [birth year], 83 y.o.   MRN: [id number]  CC: Samantha [name] is a 83 y.o. female who presents today for an acute visit.    HPI: Complains epigastric pain started 4 days ago after meal , constant.    Feels like gas  Describes the area to press on the area and if takes a deep breath pain will be bilateral under both breasts. Pain will 'grab me' but not exquisite.   Pain is not worse with eating.   Pain is not present 'if not taking deep breath'.  No pressure on chest, left arm numbness , jaw numbness, cough, congestion,early satiety,  Sob, nausea, diaphoresis, vomiting, dysuria.  Last BM yesterday. Endorses constipation, episodic.  Passing gas. Feels like stomach is feel of gas.   Appetite is unchanged.   Symptoms today do not remind her of prior MI; at that time she was having left arm numbness.     Compliant with protonix  40mg  every day  Compliant with ferrous sulfate 352mg  every day, b12 1000mcg daily  Back pain is at baseline, 'tolerable' .   H/o CAD,MI,  HTN, intermittent atrial fibrillation, COPD, GIB, follicular lymphoma  She is compliant with eliquis  5mg  bid without  missed doses.   Smoker  CXR 06/04/23 COPD, stable  lymph node, minimal coronary artery calcification  H/o cholecystectomy    No NSAID. Allergies: Hydrocodone and Oxycodone  Current Outpatient Medications on File Prior to Visit  Medication Sig Dispense Refill   ALPRAZolam  (XANAX ) 0.25 MG tablet TAKE 1 TABLET EVERY DAY AS NEEDED FOR ANXIETY 30 tablet 2   amiodarone  (PACERONE ) 200 MG tablet TAKE 1/2 TABLET EVERY MORNING 45 tablet 3   atorvastatin  (LIPITOR) 40  MG tablet Take 1 tablet (40 mg total) by mouth daily. *NEED OFFICE VISIT* TAKE 1 TABLET EVERY DAY 90 tablet 3   b complex vitamins capsule Take 1 capsule by mouth daily. 30 capsule 5   Cholecalciferol  1.25 MG (50000 UT) TABS 50,000 units PO qwk for 8 weeks. 8 tablet 0   Cyanocobalamin  (B-12) 1000 MCG SUBL Place 1,000 mcg under the tongue daily. 30 tablet 11   ELIQUIS  5 MG TABS tablet TAKE 1 TABLET BY MOUTH 2 TIMES A DAY 180 tablet 1   fluticasone  furoate-vilanterol (BREO ELLIPTA ) 100-25 MCG/ACT AEPB Inhale 1 puff into the lungs daily. 90  each 3   gabapentin  (NEURONTIN ) 100 MG capsule TAKE TWO CAPSULES BY MOUTH THREE TIMES A DAY 270 capsule 1   nitroGLYCERIN  (NITROSTAT ) 0.4 MG SL tablet Place 1 tablet (0.4 mg total) under the tongue every 5 (five) minutes x 3 doses as needed for chest pain. 25 tablet 0   pantoprazole  (PROTONIX ) 40 MG tablet Take 1 tablet (40 mg total) by mouth daily before breakfast. 30 tablet 1   PARoxetine  (PAXIL ) 10 MG tablet Take 1 tablet (10 mg total) by mouth daily. 90 tablet 3   No current facility-administered medications on file prior to visit.    Review of Systems  Constitutional:  Negative for chills, diaphoresis and fever.  HENT:  Negative for congestion.   Respiratory:  Negative for cough and shortness of breath.   Cardiovascular:  Negative for chest pain, palpitations and leg swelling.  Gastrointestinal:  Positive for abdominal distention and constipation. Negative for nausea and vomiting.  Musculoskeletal:  Positive for back pain (chronic).      Objective:    BP (!) 134/50   Pulse (!) 54   Temp 98.4 F (36.9 C) (Oral)   Ht 6\' 1"  (1.854 m)   Wt 182 lb 9.6 oz (82.8 kg)   SpO2 94%   BMI 24.09 kg/m   BP Readings from Last 3 Encounters:  07/17/23 (!) 134/50  06/12/23 (!) 149/71  06/10/23 (!) 153/64   Wt Readings from Last 3 Encounters:  07/17/23 182 lb 9.6 oz (82.8 kg)  06/12/23 189 lb 12.8 oz (86.1 kg)  06/10/23 190 lb 3.2 oz (86.3 kg)    Physical Exam Vitals reviewed.  Constitutional:      Appearance: Normal appearance. She is well-developed.  Eyes:     Conjunctiva/sclera: Conjunctivae normal.  Cardiovascular:     Rate and Rhythm: Normal rate and regular rhythm.     Pulses: Normal pulses.     Heart sounds: Normal heart sounds.  Pulmonary:     Effort: Pulmonary effort is normal.     Breath sounds: Normal breath sounds. No wheezing, rhonchi or rales.  Chest:     Chest wall: No tenderness.       Comments: No reproducible tenderness across sternum. No ecchymosis, gross  deformity, bony step off.  Abdominal:     General: Bowel sounds are normal. There is no distension.     Palpations: Abdomen is soft. Abdomen is not rigid. There is no fluid wave or mass.     Tenderness: There is abdominal tenderness in the right upper quadrant and left upper quadrant. There is no right CVA tenderness, left CVA tenderness, guarding or rebound.     Comments: Discomfort with deep inspiration upper abdomen. No pain with lateral side bends.  Abdomen soft. Slightly distended. No guarding.   Musculoskeletal:     Right lower leg: No edema.     Left  lower leg: No edema.  Skin:    General: Skin is warm and dry.  Neurological:     Mental Status: She is alert.  Psychiatric:        Speech: Speech normal.        Behavior: Behavior normal.        Thought Content: Thought content normal.

## 2023-07-17 NOTE — Assessment & Plan Note (Addendum)
 Chronic, symptomatically stable. Decreased toprol  from 50mg  to 25mg  d/t bradycardia. Continue eliquis  5mg  BID, amiodarone  as managed by Dr Gollan.

## 2023-07-17 NOTE — Assessment & Plan Note (Signed)
 Reassuring exam. Patient nontoxic in appearance. Able to reproduce bilateral upper abdominal pain with deep inspiration and palpation. Non exquisite. Obtained EKG d/t epigastric pain which showed sinus bradycardia. Artifact from spinal nerve stimulator. When compared to prior,  EKG 04/08/23 , no significant changes.   Question if GERD, constipation playing a role. Start pepcid OTC. Pending CXR, Ab XR.   EKG and case reviewed with supervising, Dr Creta Dolin, and she and I jointly agreed on management plan.

## 2023-07-17 NOTE — Patient Instructions (Addendum)
 Decrease metoprolol  to 25mg  daily from 50mg  due to low heart rate. We will monitor blood pressure  Trial start pepcid ac over the counter  Consider starting MiraLAX  every other day to manage constipation   Pending xrays to evaluate for constipation and pneumonia.  Please go to [facility] to have these obtained today.  We will be in close touch

## 2023-07-18 ENCOUNTER — Telehealth: Payer: Self-pay

## 2023-07-18 ENCOUNTER — Ambulatory Visit: Admitting: Family

## 2023-07-18 NOTE — Telephone Encounter (Signed)
 LVM for pt to call back please Sch 6 week follow; she didn't sch today

## 2023-07-23 ENCOUNTER — Ambulatory Visit: Payer: Medicare HMO | Admitting: Family

## 2023-08-07 ENCOUNTER — Encounter: Payer: Self-pay | Admitting: Pulmonary Disease

## 2023-08-07 ENCOUNTER — Ambulatory Visit: Payer: Medicare HMO | Admitting: Pulmonary Disease

## 2023-08-07 VITALS — BP 145/60 | HR 54 | Ht 73.0 in | Wt 183.5 lb

## 2023-08-07 DIAGNOSIS — J189 Pneumonia, unspecified organism: Secondary | ICD-10-CM

## 2023-08-07 DIAGNOSIS — J45909 Unspecified asthma, uncomplicated: Secondary | ICD-10-CM

## 2023-08-07 DIAGNOSIS — J449 Chronic obstructive pulmonary disease, unspecified: Secondary | ICD-10-CM

## 2023-08-07 DIAGNOSIS — J4489 Other specified chronic obstructive pulmonary disease: Secondary | ICD-10-CM

## 2023-08-07 MED ORDER — ALBUTEROL SULFATE HFA 108 (90 BASE) MCG/ACT IN AERS: 2.0000 | INHALATION_SPRAY | Freq: Four times a day (QID) | RESPIRATORY_TRACT | 2 refills | Status: AC | PRN

## 2023-08-07 NOTE — Progress Notes (Signed)
 Synopsis: Referred in by Calista Catching, FNP   Subjective:   PATIENT ID: Samantha [name] GENDER: female DOB: [birth year], MRN: [id number]  Chief Complaint  Patient presents with   COPD    HPI Samantha [name] is an 83 year old female patient with a past medical history of hypertension, anxiety, paroxysmal A.fib on eliquis  and COPD presenting today to the pulmonary clinic to re-establish care.   She saw Samantha [name] in Sept 2022 for dyspnea on exertion and was supposed to be on Symbicort . However she was lost to follow up and currently is only on albuterol .   PFTs 2022 - Obstructive defect with significant response to BDL. Normal lung volumes and moderately reduced DLCO.   She contracted a multifocal pneumonia in January 2025 c/b  COPD exacerbation which led to shortness of breath chest tightness and wheezing. This was managed appropriately and she has imbroved signigicantly.  OV 08/08/2023 - Patient is feeling well overall. Has no complaints. Compliant with her breo ellipta . Rarely using her rescue inhaler. Did not repeat her PFTs nor her CT chest.   ROS All systems were reviewed and are negative except for the above. Objective:   Vitals:   08/07/23 1512  BP: (!) 145/60  Pulse: (!) 54  SpO2: 92%  Weight: 183 lb 8 oz (83.2 kg)  Height: 6\' 1"  (1.854 m)   92% on  RA BMI Readings from Last 3 Encounters:  08/07/23 24.21 kg/m  07/17/23 24.09 kg/m  06/12/23 28.03 kg/m   Wt Readings from Last 3 Encounters:  08/07/23 183 lb 8 oz (83.2 kg)  07/17/23 182 lb 9.6 oz (82.8 kg)  06/12/23 189 lb 12.8 oz (86.1 kg)    Physical Exam GEN: NAD, Healthy Appearing HEENT: Supple Neck, Reactive Pupils, EOMI  CVS: Normal S1, Normal S2, RRR, No murmurs or ES appreciated  Lungs: Faint expiratory wheezing heard bilaterally.  Abdomen: Soft, non tender, non distended, + BS  Extremities: Warm and well perfused, No edema  Skin: No suspicious lesions appreciated  Psych: Normal Affect  Ancillary  Information   CBC    Component Value Date/Time   WBC 9.3 05/27/2023 1015   WBC 8.1 05/14/2023 1434   RBC 3.92 05/27/2023 1015   HGB 9.2 (L) 05/27/2023 1015   HGB 12.7 03/04/2017 1349   HCT 31.1 (L) 05/27/2023 1015   HCT 39.8 03/04/2017 1349   PLT 303 05/27/2023 1015   PLT 180 03/04/2017 1349   MCV 79.3 (L) 05/27/2023 1015   MCV 99.0 03/04/2017 1349   MCH 23.5 (L) 05/27/2023 1015   MCHC 29.6 (L) 05/27/2023 1015   RDW 15.9 (H) 05/27/2023 1015   RDW 12.9 03/04/2017 1349   LYMPHSABS 2.3 05/27/2023 1015   LYMPHSABS 1.9 03/04/2017 1349   MONOABS 0.8 05/27/2023 1015   MONOABS 0.6 03/04/2017 1349   EOSABS 0.2 05/27/2023 1015   EOSABS 0.2 03/04/2017 1349   BASOSABS 0.1 05/27/2023 1015   BASOSABS 0.0 03/04/2017 1349        Latest Ref Rng & Units 01/26/2021    2:39 PM  PFT Results  FVC-Pre L 2.39   FVC-Predicted Pre % 72   FVC-Post L 2.63   FVC-Predicted Post % 79   Pre FEV1/FVC % % 68   Post FEV1/FCV % % 68   FEV1-Pre L 1.62   FEV1-Predicted Pre % 65   FEV1-Post L 1.79   DLCO uncorrected ml/min/mmHg 14.10   DLCO UNC% % 63   DLVA Predicted % 74  TLC L 5.30   TLC % Predicted % 91   RV % Predicted % 102      Assessment & Plan:  Samantha [name] is an 83 year old female patient with a past medical history of hypertension, anxiety, paroxysmal A.fib on eliquis  and COPD presenting today to the pulmonary clinic to re-establish care.  #Stage II COPD GpA  #Asthma overlap #Multifocal pneumonia 03/2023  []  c/w Fluticasone -Vilanterol [Breo] 100 1puff daily.  []  Albuterol  as needed.   RTC 6 months.   I spent 30 minutes caring for this patient today, including preparing to see the patient, obtaining a medical history , reviewing a separately obtained history, performing a medically appropriate examination and/or evaluation, counseling and educating the patient/family/caregiver, ordering medications, tests, or procedures, documenting clinical information in the electronic health  record, and independently interpreting results (not separately reported/billed) and communicating results to the patient/family/caregiver  Annitta Kindler, MD [facility] 08/07/2023 3:41 PM

## 2023-08-23 ENCOUNTER — Other Ambulatory Visit: Payer: Self-pay

## 2023-08-23 DIAGNOSIS — D500 Iron deficiency anemia secondary to blood loss (chronic): Secondary | ICD-10-CM

## 2023-08-23 DIAGNOSIS — C8298 Follicular lymphoma, unspecified, lymph nodes of multiple sites: Secondary | ICD-10-CM

## 2023-08-25 NOTE — Progress Notes (Signed)
 HEMATOLOGY/ONCOLOGY CLINIC NOTE  Date of Service: 08/26/2023  Patient Care Team: Calista Catching, FNP as PCP - General (Family Medicine) Jerelene Monday Deadra Everts, MD as PCP - Cardiology (Cardiology)  CHIEF COMPLAINTS/PURPOSE OF CONSULTATION:  For continued evaluation and management of low-grade follicular lymphoma  HISTORY OF PRESENTING ILLNESS:   Samantha [name] is a wonderful 83 y.o. female who has been referred to us  by Dr. Bascom Bossier for evaluation and management of recurrence of lymphoma. Patient was having excessive night sweats and saw her PCP. She had an CT chest and abdomen on 10/20/2022 which shows concerns for recurrence.   Patient previously used to follow-up with me for follicular lymphoma of lymph nodes of multiple regions and was last seen by me on 07/24/2019. Patient was suppose to follow-up with us , but did not follow-up. Patient was treated with 5 cycles of Bendamustine  and Rituxan , with her last treatment in June 2018. PET scan and CT scans after 2018 did not show any disease progression.   Patient is accompanied by her son during this visit. She notes she has been doing well overall for the past 6 months. She notes she has been having drenching night sweats for around 1-2 months. She denies any recent infections or any respiratory symptoms. Patient notes she is not been having night sweats for the past 2 weeks.   Patient notes she has been taking Gabapentin  for a long time and has been tolerating it well without any new or severe toxicities.   She denies any recent steroid usage or any other medicine changes in the past 6 months. She is complaint with all of her medicine. She denies taking any allergy medication.   She denies any new infection issues, fever, chills, unexpected weight loss, chest pain, abdominal pain, back pain, or leg swelling. She does complain of mild SOB and mild occasional fatigue. Patient complains of more frequent bathroom usage, but denies  diarrhea. She denies black stool or blood in stool.   During the physical examination, patient complained of mild lower left abdominal discomfort. Denies pain.   Patient notes she used to have hot flashes and went to her PCP. She was prescribed Paxil  10 mg which has improved her hot flashes and nightsweats. She denies hot flashes during this visit.    Patient notes she smokes little less than half pack a day.   She reports of mild change in appetite, but denies any weight loss.   INTERVAL HISTORY:  Samantha [name] is a wonderful 83 y.o. female who is here for continued evaluation and management of recurrence of lymphoma. Patient was last seen by me on 05/27/2023 and complained of mild fatigue, occasional black stool, and stable night sweats.   Patient notes no acute new symptoms since her last clinic visit.  Has not noticed any significant black stools since her last visit. Still continues to be on PPIs and Eliquis . Tolerated IV iron  well with an improvement in her hemoglobin levels from 8.4 to 11.9. She has not seen gastroenterology yet for discussed issues with her black stools and iron  deficiency with her primary care physician and developed a plan regarding her anticoagulation with her primary care physician and cardiologist.    MEDICAL HISTORY:  Past Medical History:  Diagnosis Date   A-fib [facility])    Arthritis    Chronic back pain    COPD (chronic obstructive pulmonary disease) (HCC)    Coronary artery disease    COVID-19    08/22/20 likely  contracted late 07/2020   Glaucoma    Hearing aid worn    High cholesterol    Hypertension    Lymphadenopathy 01/25/2016   Myocardial infarction (HCC)    2000   Non Hodgkin's lymphoma (HCC)    Wears dentures     Top plate   Wears glasses     SURGICAL HISTORY: Past Surgical History:  Procedure Laterality Date   CARDIAC CATHETERIZATION     CATARACT EXTRACTION W/ INTRAOCULAR LENS IMPLANT     CHOLECYSTECTOMY     COLONOSCOPY W/  BIOPSIES AND POLYPECTOMY     CORONARY STENT PLACEMENT     INGUINAL LYMPH NODE BIOPSY Right 01/25/2016   Procedure: EXCISION DEEP RIGHT INGUINAL LYMPH NODE BIOPSY WITH ULTRA SOUND;  Surgeon: Boyce Byes, MD;  Location: [facility] OR;  Service: General;  Laterality: Right;   SPINE SURGERY      SOCIAL HISTORY: Social History   Socioeconomic History   Marital status: Single    Spouse name: Not on file   Number of children: Not on file   Years of education: Not on file   Highest education level: GED or equivalent  Occupational History   Not on file  Tobacco Use   Smoking status: Every Day    Current packs/day: 0.50    Average packs/day: 0.5 packs/day for 60.0 years (30.0 ttl pk-yrs)    Types: Cigarettes   Smokeless tobacco: Never   Tobacco comments:    3-4 cigs daily--04/23/2023  Vaping Use   Vaping status: Never Used  Substance and Sexual Activity   Alcohol use: Yes    Comment: social   Drug use: No   Sexual activity: Not Currently  Other Topics Concern   Not on file  Social History Narrative   Not on file   Social Drivers of Health   Financial Resource Strain: Low Risk  (04/02/2023)   Overall Financial Resource Strain (CARDIA)    Difficulty of Paying Living Expenses: Not hard at all  Food Insecurity: No Food Insecurity (04/11/2023)   Hunger Vital Sign    Worried About Running Out of Food in the Last Year: Never true    Ran Out of Food in the Last Year: Never true  Transportation Needs: No Transportation Needs (04/11/2023)   PRAPARE - Administrator, Civil Service (Medical): No    Lack of Transportation (Non-Medical): No  Physical Activity: Inactive (04/02/2023)   Exercise Vital Sign    Days of Exercise per Week: 0 days    Minutes of Exercise per Session: 0 min  Stress: No Stress Concern Present (04/02/2023)   [employer] of Occupational Health - Occupational Stress Questionnaire    Feeling of Stress : Not at all  Social Connections: Moderately Isolated  (04/11/2023)   Social Connection and Isolation Panel    Frequency of Communication with Friends and Family: More than three times a week    Frequency of Social Gatherings with Friends and Family: Three times a week    Attends Religious Services: Never    Active Member of Clubs or Organizations: Yes    Attends Banker Meetings: More than 4 times per year    Marital Status: Widowed  Intimate Partner Violence: Not At Risk (04/11/2023)   Humiliation, Afraid, Rape, and Kick questionnaire    Fear of Current or Ex-Partner: No    Emotionally Abused: No    Physically Abused: No    Sexually Abused: No    FAMILY HISTORY: Family History  Problem  Relation Age of Onset   Hypertension Mother    Stroke Mother    Heart disease Father    Heart disease Other     ALLERGIES:  is allergic to hydrocodone and oxycodone .  MEDICATIONS:  Current Outpatient Medications  Medication Sig Dispense Refill   albuterol  (VENTOLIN  HFA) 108 (90 Base) MCG/ACT inhaler Inhale 2 puffs into the lungs every 6 (six) hours as needed for wheezing or shortness of breath. 8 g 2   ALPRAZolam  (XANAX ) 0.25 MG tablet TAKE 1 TABLET EVERY DAY AS NEEDED FOR ANXIETY 30 tablet 2   amiodarone  (PACERONE ) 200 MG tablet TAKE 1/2 TABLET EVERY MORNING 45 tablet 3   amLODipine  (NORVASC ) 2.5 MG tablet Take 1 tablet (2.5 mg total) by mouth daily. 90 tablet 3   atorvastatin  (LIPITOR) 40 MG tablet Take 1 tablet (40 mg total) by mouth daily. *NEED OFFICE VISIT* TAKE 1 TABLET EVERY DAY 90 tablet 3   b complex vitamins capsule Take 1 capsule by mouth daily. 30 capsule 5   Cholecalciferol  1.25 MG (50000 UT) TABS 50,000 units PO qwk for 8 weeks. 8 tablet 0   Cyanocobalamin  (B-12) 1000 MCG SUBL Place 1,000 mcg under the tongue daily. 30 tablet 11   ELIQUIS  5 MG TABS tablet TAKE 1 TABLET BY MOUTH 2 TIMES A DAY 180 tablet 1   fluticasone  furoate-vilanterol (BREO ELLIPTA ) 100-25 MCG/ACT AEPB Inhale 1 puff into the lungs daily. 90 each 3    gabapentin  (NEURONTIN ) 100 MG capsule Take 3 capsules (300 mg total) by mouth 2 (two) times daily.     metoprolol  succinate (TOPROL -XL) 50 MG 24 hr tablet Take 0.5 tablets (25 mg total) by mouth daily. 90 tablet 3   nitroGLYCERIN  (NITROSTAT ) 0.4 MG SL tablet Place 1 tablet (0.4 mg total) under the tongue every 5 (five) minutes x 3 doses as needed for chest pain. 25 tablet 0   pantoprazole  (PROTONIX ) 40 MG tablet Take 1 tablet (40 mg total) by mouth daily before breakfast. 30 tablet 1   PARoxetine  (PAXIL ) 10 MG tablet Take 1 tablet (10 mg total) by mouth daily. 90 tablet 3   No current facility-administered medications for this visit.    REVIEW OF SYSTEMS:    10 Point review of Systems was done is negative except as noted above.  PHYSICAL EXAMINATION: ECOG PERFORMANCE STATUS: 2 - Symptomatic, <50% confined to bed  . Vitals:   08/26/23 1211  BP: (!) 140/80  Pulse: (!) 58  Resp: 18  Temp: (!) 97.2 F (36.2 C)  SpO2: 96%    Filed Weights   08/26/23 1211  Weight: 183 lb (83 kg)    .Body mass index is 24.14 kg/m.   GENERAL:alert, in no acute distress and comfortable SKIN: no acute rashes, no significant lesions EYES: conjunctiva are pink and non-injected, sclera anicteric OROPHARYNX: MMM, no exudates, no oropharyngeal erythema or ulceration NECK: supple, no JVD LYMPH:  no palpable lymphadenopathy in the cervical, axillary or inguinal regions LUNGS: clear to auscultation b/l with normal respiratory effort HEART: regular rate & rhythm ABDOMEN:  normoactive bowel sounds , non tender, not distended. Extremity: no pedal edema PSYCH: alert & oriented x 3 with fluent speech NEURO: no focal motor/sensory deficits   LABORATORY DATA:  I have reviewed the data as listed  .    Latest Ref Rng & Units 08/26/2023   11:29 AM 05/27/2023   10:15 AM 05/14/2023    2:34 PM  CBC  WBC 4.0 - 10.5 K/uL 9.7  9.3  8.1   Hemoglobin 12.0 - 15.0 g/dL 27.2  9.2  9.0   Hematocrit 36.0 - 46.0 %  37.1  31.1  29.6   Platelets 150 - 400 K/uL 253  303  255.0     .    Latest Ref Rng & Units 08/26/2023   11:29 AM 05/27/2023   10:15 AM 05/14/2023    2:34 PM  CMP  Glucose 70 - 99 mg/dL 536  644    BUN 8 - 23 mg/dL 11  15    Creatinine 0.34 - 1.00 mg/dL 7.42  5.95    Sodium 638 - 145 mmol/L 143  142    Potassium 3.5 - 5.1 mmol/L 4.1  4.0    Chloride 98 - 111 mmol/L 105  106    CO2 22 - 32 mmol/L 33  30    Calcium  8.9 - 10.3 mg/dL 9.3  8.5    Total Protein 6.5 - 8.1 g/dL 6.8  6.4  5.9   Total Bilirubin 0.0 - 1.2 mg/dL 0.4  0.3    Alkaline Phos 38 - 126 U/L 63  60    AST 15 - 41 U/L 18  16    ALT 0 - 44 U/L 12  8     . Lab Results  Component Value Date   IRON  40 08/26/2023   TIBC 393 08/26/2023   IRONPCTSAT 10 (L) 08/26/2023   (Iron  and TIBC)  Lab Results  Component Value Date   FERRITIN 33 08/26/2023     RADIOGRAPHIC STUDIES: I have personally reviewed the radiological images as listed and agreed with the findings in the report. No results found.  ASSESSMENT & PLAN:   83 y.o. Caucasian female with multiple chronic medical comorbidities as noted above with    #1 h/o Stage IVB Low grade Follicular Lymphoma diagnosed in 2018 with extensive retroperitoneal lymphadenopathy and right inguinal lymphadenopathy as well as osseous involvement of the sacrum. LDH level within normal limits.  S/p 5 cycles of BR (Bendamustine  70mg /m2)   PET CT scan after 5 cycles of Bendamustine  and Rituxan  done on 09/06/2016 shows no residual metabolic active disease and no evidence of disease progression.   CT C/A/P on 05/20/17 reveals no radiographic evidence of disease progression.    02/19/18 CT C/A/P revealed Stable mild retroperitoneal adenopathy, compatible with treated lymphoma. No new or progressive adenopathy. No new sites of disease. Normal size spleen. 2. Three-vessel coronary atherosclerosis. 3. Aortic Atherosclerosis.  PLAN:  -Discussed lab results from today, 08/26/2023, in detail  with patient Patient's hemoglobin levels have improved from 8.4 to 11.9 She notes no new fevers chills night sweats or unexpected weight loss. Continues to have intermittent but less frequent black stools. She was recommended to follow-up with her PCP for GI referral which has been placed to [facility] in [place]. She was also recommended to connect with her cardiologist and PCP regarding continued anticoagulation and whether a consideration of reducing the Eliquis  dose will be needed. She continues to be on PPIs. - No acute new symptoms suggestive of symptomatic progression of her low-grade follicular lymphoma. - No indication for treatment of her low-grade follicular lymphoma at this time. - Ferritin level is 33 and iron  saturation is 10% which is still low and we shall give her the option for additional IV iron  to target a ferritin of close to 100 and iron  saturation of at least 20%. -  FOLLOW-UP: RTC with Dr Salomon Cree with labs in 4 months   The total  time spent in the appointment was 30 minutes* .  All of the patient's questions were answered with apparent satisfaction. The patient knows to call the clinic with any problems, questions or concerns.   Jacquelyn Matt MD MS AAHIVMS [facility] Hematology/Oncology Physician [facility]  .*Total Encounter Time as defined by the Centers for Medicare and Medicaid Services includes, in addition to the face-to-face time of a patient visit (documented in the note above) non-face-to-face time: obtaining and reviewing outside history, ordering and reviewing medications, tests or procedures, care coordination (communications with other health care professionals or caregivers) and documentation in the medical record.    I,Mitra Faeizi,acting as a Neurosurgeon for Jacquelyn Matt, MD.,have documented all relevant documentation on the behalf of Jacquelyn Matt, MD,as directed by  Jacquelyn Matt, MD while in the presence of Jacquelyn Matt, MD.  .I have reviewed  the above documentation for accuracy and completeness, and I agree with the above. .Tremeka Helbling Kishore Anis Cinelli MD

## 2023-08-26 ENCOUNTER — Other Ambulatory Visit: Payer: Self-pay | Admitting: *Deleted

## 2023-08-26 ENCOUNTER — Inpatient Hospital Stay: Attending: Hematology

## 2023-08-26 ENCOUNTER — Inpatient Hospital Stay: Admitting: Hematology

## 2023-08-26 ENCOUNTER — Other Ambulatory Visit: Payer: Self-pay

## 2023-08-26 VITALS — BP 140/80 | HR 58 | Temp 97.2°F | Resp 18 | Wt 183.0 lb

## 2023-08-26 DIAGNOSIS — C8298 Follicular lymphoma, unspecified, lymph nodes of multiple sites: Secondary | ICD-10-CM

## 2023-08-26 DIAGNOSIS — Z862 Personal history of diseases of the blood and blood-forming organs and certain disorders involving the immune mechanism: Secondary | ICD-10-CM | POA: Diagnosis not present

## 2023-08-26 DIAGNOSIS — D500 Iron deficiency anemia secondary to blood loss (chronic): Secondary | ICD-10-CM

## 2023-08-26 DIAGNOSIS — Z8572 Personal history of non-Hodgkin lymphomas: Secondary | ICD-10-CM | POA: Insufficient documentation

## 2023-08-26 DIAGNOSIS — F17210 Nicotine dependence, cigarettes, uncomplicated: Secondary | ICD-10-CM | POA: Insufficient documentation

## 2023-08-26 DIAGNOSIS — Z9221 Personal history of antineoplastic chemotherapy: Secondary | ICD-10-CM | POA: Diagnosis not present

## 2023-08-26 LAB — CBC WITH DIFFERENTIAL (CANCER CENTER ONLY)
Abs Immature Granulocytes: 0.03 10*3/uL (ref 0.00–0.07)
Basophils Absolute: 0.1 10*3/uL (ref 0.0–0.1)
Basophils Relative: 1 %
Eosinophils Absolute: 0.2 10*3/uL (ref 0.0–0.5)
Eosinophils Relative: 2 %
HCT: 37.1 % (ref 36.0–46.0)
Hemoglobin: 11.9 g/dL — ABNORMAL LOW (ref 12.0–15.0)
Immature Granulocytes: 0 %
Lymphocytes Relative: 26 %
Lymphs Abs: 2.5 10*3/uL (ref 0.7–4.0)
MCH: 28.7 pg (ref 26.0–34.0)
MCHC: 32.1 g/dL (ref 30.0–36.0)
MCV: 89.4 fL (ref 80.0–100.0)
Monocytes Absolute: 0.6 10*3/uL (ref 0.1–1.0)
Monocytes Relative: 7 %
Neutro Abs: 6.3 10*3/uL (ref 1.7–7.7)
Neutrophils Relative %: 64 %
Platelet Count: 253 10*3/uL (ref 150–400)
RBC: 4.15 MIL/uL (ref 3.87–5.11)
RDW: 16.6 % — ABNORMAL HIGH (ref 11.5–15.5)
WBC Count: 9.7 10*3/uL (ref 4.0–10.5)
nRBC: 0 % (ref 0.0–0.2)

## 2023-08-26 LAB — CMP (CANCER CENTER ONLY)
ALT: 12 U/L (ref 0–44)
AST: 18 U/L (ref 15–41)
Albumin: 3.8 g/dL (ref 3.5–5.0)
Alkaline Phosphatase: 63 U/L (ref 38–126)
Anion gap: 5 (ref 5–15)
BUN: 11 mg/dL (ref 8–23)
CO2: 33 mmol/L — ABNORMAL HIGH (ref 22–32)
Calcium: 9.3 mg/dL (ref 8.9–10.3)
Chloride: 105 mmol/L (ref 98–111)
Creatinine: 0.77 mg/dL (ref 0.44–1.00)
GFR, Estimated: >60 mL/min (ref >60)
Glucose, Bld: 104 mg/dL — ABNORMAL HIGH (ref 70–99)
Potassium: 4.1 mmol/L (ref 3.5–5.1)
Sodium: 143 mmol/L (ref 135–145)
Total Bilirubin: 0.4 mg/dL (ref 0.0–1.2)
Total Protein: 6.8 g/dL (ref 6.5–8.1)

## 2023-08-26 LAB — IRON AND IRON BINDING CAPACITY (CC-WL,HP ONLY)
Iron: 40 ug/dL (ref 28–170)
Saturation Ratios: 10 % — ABNORMAL LOW (ref 10.4–31.8)
TIBC: 393 ug/dL (ref 250–450)
UIBC: 353 ug/dL (ref 148–442)

## 2023-08-26 LAB — TSH: TSH: 2.34 u[IU]/mL (ref 0.350–4.500)

## 2023-08-26 LAB — LACTATE DEHYDROGENASE: LDH: 191 U/L (ref 98–192)

## 2023-08-27 LAB — FERRITIN: Ferritin: 33 ng/mL (ref 11–307)

## 2023-08-29 ENCOUNTER — Ambulatory Visit (INDEPENDENT_AMBULATORY_CARE_PROVIDER_SITE_OTHER): Admitting: Family

## 2023-08-29 ENCOUNTER — Encounter: Payer: Self-pay | Admitting: Family

## 2023-08-29 VITALS — BP 126/76 | HR 95 | Temp 98.1°F | Ht 73.0 in | Wt 183.0 lb

## 2023-08-29 DIAGNOSIS — F32A Depression, unspecified: Secondary | ICD-10-CM

## 2023-08-29 DIAGNOSIS — G8929 Other chronic pain: Secondary | ICD-10-CM | POA: Diagnosis not present

## 2023-08-29 DIAGNOSIS — F419 Anxiety disorder, unspecified: Secondary | ICD-10-CM

## 2023-08-29 DIAGNOSIS — M545 Low back pain, unspecified: Secondary | ICD-10-CM | POA: Diagnosis not present

## 2023-08-29 DIAGNOSIS — M79672 Pain in left foot: Secondary | ICD-10-CM | POA: Diagnosis not present

## 2023-08-29 DIAGNOSIS — R1010 Upper abdominal pain, unspecified: Secondary | ICD-10-CM

## 2023-08-29 MED ORDER — GABAPENTIN 100 MG PO CAPS: 300.0000 mg | ORAL_CAPSULE | Freq: Two times a day (BID) | ORAL | Status: DC

## 2023-08-29 MED ORDER — GABAPENTIN 100 MG PO CAPS: 200.0000 mg | ORAL_CAPSULE | Freq: Two times a day (BID) | ORAL | Status: DC

## 2023-08-29 NOTE — Progress Notes (Signed)
 Assessment & Plan:  Chronic low back pain, unspecified back pain laterality, unspecified whether sciatica present Assessment & Plan: Chronic, suboptimal control.  Increase gabapentin  to 300 mg twice daily.  Monitor for sedation.   Left foot pain -     Gabapentin ; Take 3 capsules (300 mg total) by mouth 2 (two) times daily.  Pain of upper abdomen Assessment & Plan: Completely resolved.  Declines further evaluation at this time   Anxiety and depression Assessment & Plan: Chronic, stable.  Continue  Paxil  10 mg every day.  Very rare use of Xanax  0.25 mg.       Return precautions given.   Risks, benefits, and alternatives of the medications and treatment plan prescribed today were discussed, and patient expressed understanding.   Education regarding symptom management and diagnosis given to patient on AVS either electronically or printed.  Return in about 6 months (around 02/28/2024).  Bascom Bossier, FNP  Subjective:    Patient ID: Samantha [name], female    DOB: 07-13-1940, 83 y.o.   MRN: [id number]  CC: Samantha [name] is a 83 y.o. female who presents today for follow up.   HPI: She feels well today No new complaints  Breathing is at baseline.  Compliant with Breo.  Denies shortness of breath, leg swelling, chest pain.  Abdominal pain has completely resolved  She takes miralax  prn She didn't take pepcid ac .  Protonix  40 mg daily. Continues take gabapentin  200 mg twice daily.  She has breakthrough left leg pain, numbness.  Status post spinal nerve stimulator. She does not feel overly sedated on gabapentin   Compliant with Paxil  10mg  daily.  She feels this medication has been helpful for her.  Uses Xanax  f/u dr Ardelle Kos 08/26/23 ( note is not closed) Hgb 11.9 ( 9.2)    Allergies: Hydrocodone and Oxycodone  Current Outpatient Medications on File Prior to Visit  Medication Sig Dispense Refill   albuterol  (VENTOLIN  HFA) 108 (90 Base) MCG/ACT inhaler Inhale 2 puffs into the  lungs every 6 (six) hours as needed for wheezing or shortness of breath. 8 g 2   ALPRAZolam  (XANAX ) 0.25 MG tablet TAKE 1 TABLET EVERY DAY AS NEEDED FOR ANXIETY 30 tablet 2   amiodarone  (PACERONE ) 200 MG tablet TAKE 1/2 TABLET EVERY MORNING 45 tablet 3   amLODipine  (NORVASC ) 2.5 MG tablet Take 1 tablet (2.5 mg total) by mouth daily. 90 tablet 3   atorvastatin  (LIPITOR) 40 MG tablet Take 1 tablet (40 mg total) by mouth daily. *NEED OFFICE VISIT* TAKE 1 TABLET EVERY DAY 90 tablet 3   b complex vitamins capsule Take 1 capsule by mouth daily. 30 capsule 5   Cholecalciferol  1.25 MG (50000 UT) TABS 50,000 units PO qwk for 8 weeks. 8 tablet 0   Cyanocobalamin  (B-12) 1000 MCG SUBL Place 1,000 mcg under the tongue daily. 30 tablet 11   ELIQUIS  5 MG TABS tablet TAKE 1 TABLET BY MOUTH 2 TIMES A DAY 180 tablet 1   fluticasone  furoate-vilanterol (BREO ELLIPTA ) 100-25 MCG/ACT AEPB Inhale 1 puff into the lungs daily. 90 each 3   metoprolol  succinate (TOPROL -XL) 50 MG 24 hr tablet Take 0.5 tablets (25 mg total) by mouth daily. 90 tablet 3   nitroGLYCERIN  (NITROSTAT ) 0.4 MG SL tablet Place 1 tablet (0.4 mg total) under the tongue every 5 (five) minutes x 3 doses as needed for chest pain. 25 tablet 0   pantoprazole  (PROTONIX ) 40 MG tablet Take 1 tablet (40 mg total) by mouth daily before breakfast.  30 tablet 1   PARoxetine  (PAXIL ) 10 MG tablet Take 1 tablet (10 mg total) by mouth daily. 90 tablet 3   No current facility-administered medications on file prior to visit.    Review of Systems  Constitutional:  Negative for chills and fever.  Respiratory:  Negative for cough.   Cardiovascular:  Negative for chest pain and palpitations.  Gastrointestinal:  Negative for nausea and vomiting.  Musculoskeletal:  Positive for back pain.  Neurological:  Positive for numbness (left leg).      Objective:    BP 126/76   Pulse 95   Temp 98.1 F (36.7 C) (Oral)   Ht 6' 1 (1.854 m)   Wt 183 lb (83 kg)   SpO2 95%    BMI 24.14 kg/m  BP Readings from Last 3 Encounters:  08/29/23 126/76  08/26/23 (!) 140/80  08/07/23 (!) 145/60   Wt Readings from Last 3 Encounters:  08/29/23 183 lb (83 kg)  08/26/23 183 lb (83 kg)  08/07/23 183 lb 8 oz (83.2 kg)    Physical Exam Vitals reviewed.  Constitutional:      Appearance: She is well-developed.   Eyes:     Conjunctiva/sclera: Conjunctivae normal.    Cardiovascular:     Rate and Rhythm: Normal rate and regular rhythm.     Pulses: Normal pulses.     Heart sounds: Normal heart sounds.  Pulmonary:     Effort: Pulmonary effort is normal.     Breath sounds: Normal breath sounds. No wheezing, rhonchi or rales.   Musculoskeletal:     Right lower leg: No edema.     Left lower leg: No edema.   Skin:    General: Skin is warm and dry.   Neurological:     Mental Status: She is alert.   Psychiatric:        Speech: Speech normal.        Behavior: Behavior normal.        Thought Content: Thought content normal.

## 2023-08-29 NOTE — Assessment & Plan Note (Signed)
 Chronic, stable.  Continue  Paxil  10 mg every day.  Very rare use of Xanax  0.25 mg.

## 2023-08-29 NOTE — Assessment & Plan Note (Signed)
 Chronic, suboptimal control.  Increase gabapentin  to 300 mg twice daily.  Monitor for sedation.

## 2023-08-29 NOTE — Assessment & Plan Note (Signed)
 Completely resolved.  Declines further evaluation at this time

## 2023-09-01 ENCOUNTER — Encounter: Payer: Self-pay | Admitting: Hematology

## 2023-09-02 ENCOUNTER — Telehealth: Payer: Self-pay

## 2023-09-02 NOTE — Telephone Encounter (Signed)
 Dr. Salomon Cree, patient will be scheduled as soon as possible.  Auth Submission: NO AUTH NEEDED Site of care: Site of care: [facility] Payer: [employer] medicare Medication & CPT/J Code(s) submitted: Venofer  (Iron  Sucrose) J1756 Diagnosis Code:  Route of submission (phone, fax, portal):  Phone # Fax # Auth type: Buy/Bill PB Units/visits requested: 300mg  x 3 doses Reference number:  Approval from: 09/02/23 to 01/02/24

## 2023-09-09 ENCOUNTER — Ambulatory Visit (INDEPENDENT_AMBULATORY_CARE_PROVIDER_SITE_OTHER)

## 2023-09-09 VITALS — BP 163/57 | HR 47 | Temp 98.2°F | Resp 18 | Ht 69.0 in | Wt 186.6 lb

## 2023-09-09 DIAGNOSIS — K922 Gastrointestinal hemorrhage, unspecified: Secondary | ICD-10-CM

## 2023-09-09 DIAGNOSIS — D500 Iron deficiency anemia secondary to blood loss (chronic): Secondary | ICD-10-CM | POA: Diagnosis not present

## 2023-09-09 MED ORDER — ACETAMINOPHEN 325 MG PO TABS
650.0000 mg | ORAL_TABLET | Freq: Once | ORAL | Status: AC
  Administered 2023-09-09: 650 mg via ORAL
  Filled 2023-09-09: qty 2

## 2023-09-09 MED ORDER — DIPHENHYDRAMINE HCL 25 MG PO CAPS
25.0000 mg | ORAL_CAPSULE | Freq: Once | ORAL | Status: AC
  Administered 2023-09-09: 25 mg via ORAL
  Filled 2023-09-09: qty 1

## 2023-09-09 MED ORDER — SODIUM CHLORIDE 0.9 % IV SOLN
300.0000 mg | Freq: Once | INTRAVENOUS | Status: AC
  Administered 2023-09-09: 300 mg via INTRAVENOUS
  Filled 2023-09-09: qty 15

## 2023-09-09 NOTE — Progress Notes (Signed)
 Diagnosis: Iron  Deficiency Anemia  Provider:  Praveen Mannam MD  Procedure: IV Infusion  IV Type: Peripheral, IV Location: R Antecubital  Venofer  (Iron  Sucrose), Dose: 300 mg  Infusion Start Time: 1137  Infusion Stop Time: 1325  Post Infusion IV Care: Patient declined observation and Peripheral IV Discontinued  Discharge: Condition: Good, Destination: Home . AVS Declined  Performed by:  Vernia Teem, RN

## 2023-09-16 ENCOUNTER — Ambulatory Visit

## 2023-09-16 VITALS — BP 148/72 | HR 53 | Temp 98.1°F | Resp 20 | Ht 69.0 in | Wt 188.0 lb

## 2023-09-16 DIAGNOSIS — D500 Iron deficiency anemia secondary to blood loss (chronic): Secondary | ICD-10-CM

## 2023-09-16 DIAGNOSIS — K922 Gastrointestinal hemorrhage, unspecified: Secondary | ICD-10-CM | POA: Diagnosis not present

## 2023-09-16 MED ORDER — DIPHENHYDRAMINE HCL 25 MG PO CAPS: 25.0000 mg | ORAL_CAPSULE | Freq: Once | ORAL | Status: DC

## 2023-09-16 MED ORDER — ACETAMINOPHEN 325 MG PO TABS
650.0000 mg | ORAL_TABLET | Freq: Once | ORAL | Status: DC
Start: 2023-09-16 — End: 2023-09-16

## 2023-09-16 MED ORDER — SODIUM CHLORIDE 0.9 % IV SOLN
300.0000 mg | Freq: Once | INTRAVENOUS | Status: AC
  Administered 2023-09-16: 300 mg via INTRAVENOUS
  Filled 2023-09-16: qty 15

## 2023-09-16 NOTE — Progress Notes (Signed)
 Diagnosis: Iron  Deficiency Anemia  Provider:  Praveen Mannam MD  Procedure: IV Infusion  IV Type: Peripheral, IV Location: R Forearm  Venofer  (Iron  Sucrose), Dose: 300 mg  Infusion Start Time: 1109  Infusion Stop Time: 1250  Post Infusion IV Care: Patient declined observation and Peripheral IV Discontinued  Discharge: Condition: Good, Destination: Home . AVS Declined  Performed by:  Taiana Temkin, RN

## 2023-09-23 ENCOUNTER — Ambulatory Visit: Admitting: *Deleted

## 2023-09-23 VITALS — BP 138/71 | HR 53 | Temp 98.3°F | Resp 16 | Ht 69.0 in | Wt 188.6 lb

## 2023-09-23 DIAGNOSIS — D500 Iron deficiency anemia secondary to blood loss (chronic): Secondary | ICD-10-CM | POA: Diagnosis not present

## 2023-09-23 DIAGNOSIS — K922 Gastrointestinal hemorrhage, unspecified: Secondary | ICD-10-CM | POA: Diagnosis not present

## 2023-09-23 MED ORDER — ACETAMINOPHEN 325 MG PO TABS
650.0000 mg | ORAL_TABLET | Freq: Once | ORAL | Status: DC
Start: 2023-09-23 — End: 2023-09-23

## 2023-09-23 MED ORDER — SODIUM CHLORIDE 0.9 % IV SOLN
300.0000 mg | Freq: Once | INTRAVENOUS | Status: AC
  Administered 2023-09-23: 300 mg via INTRAVENOUS
  Filled 2023-09-23: qty 15

## 2023-09-23 MED ORDER — DIPHENHYDRAMINE HCL 25 MG PO CAPS: 25.0000 mg | ORAL_CAPSULE | Freq: Once | ORAL | Status: DC

## 2023-09-23 NOTE — Progress Notes (Signed)
 Diagnosis: Iron  Deficiency Anemia  Provider:  Mannam, Praveen MD  Procedure: IV Infusion  IV Type: Peripheral, IV Location: R Forearm  Venofer  (Iron  Sucrose), Dose: 300 mg  Infusion Start Time: 1022 am  Infusion Stop Time: 1210 pm  Post Infusion IV Care: Observation period completed and Peripheral IV Discontinued  Discharge: Condition: Good, Destination: Home . AVS Declined  Performed by:  Trudy Lamarr LABOR, RN

## 2023-09-26 DIAGNOSIS — R1031 Right lower quadrant pain: Secondary | ICD-10-CM | POA: Diagnosis not present

## 2023-09-26 DIAGNOSIS — D509 Iron deficiency anemia, unspecified: Secondary | ICD-10-CM | POA: Diagnosis not present

## 2023-09-26 DIAGNOSIS — R1032 Left lower quadrant pain: Secondary | ICD-10-CM | POA: Diagnosis not present

## 2023-09-27 ENCOUNTER — Telehealth: Payer: Self-pay | Admitting: Cardiovascular Disease

## 2023-09-27 NOTE — Telephone Encounter (Signed)
   Pre-operative Risk Assessment    Patient Name: Samantha [name]  DOB: [birth year] MRN: [id number]   Date of last office visit: 10/29/22 Date of next office visit: 10/29/23   Request for Surgical Clearance    Procedure:  Colon & EGD  Date of Surgery:  Clearance 10/10/23                                Surgeon:  Dr. Unk Socks Group or Practice Name:  [facility] Phone number:  [phone] Fax number:  [phone]   Type of Clearance Requested:  Eliquis     Type of Anesthesia:  Not Indicated   Additional requests/questions:    Signed, Arsenio Celine GAILS   09/27/2023, 11:47 AM

## 2023-09-27 NOTE — Telephone Encounter (Signed)
Clinical pharmacist to review Eliquis 

## 2023-09-30 ENCOUNTER — Ambulatory Visit (INDEPENDENT_AMBULATORY_CARE_PROVIDER_SITE_OTHER): Payer: Medicare PPO | Admitting: *Deleted

## 2023-09-30 VITALS — Ht 69.0 in | Wt 185.0 lb

## 2023-09-30 DIAGNOSIS — Z0000 Encounter for general adult medical examination without abnormal findings: Secondary | ICD-10-CM | POA: Diagnosis not present

## 2023-09-30 NOTE — Progress Notes (Signed)
 Subjective:   Samantha [name] is a 83 y.o. who presents for a Medicare Wellness preventive visit.  As a reminder, Annual Wellness Visits don't include a physical exam, and some assessments may be limited, especially if this visit is performed virtually. We may recommend an in-person follow-up visit with your provider if needed.  Visit Complete: Virtual I connected with  Samantha [name] on 09/30/23 by a audio enabled telemedicine application and verified that I am speaking with the correct person using two identifiers.  Patient Location: Home  Provider Location: Home Office  I discussed the limitations of evaluation and management by telemedicine. The patient expressed understanding and agreed to proceed.  Vital Signs: Because this visit was a virtual/telehealth visit, some criteria may be missing or patient reported. Any vitals not documented were not able to be obtained and vitals that have been documented are patient reported.  VideoDeclined- This patient declined Librarian, academic. Therefore the visit was completed with audio only.  Persons Participating in Visit: Patient.  AWV Questionnaire: No: Patient Medicare AWV questionnaire was not completed prior to this visit.  Cardiac Risk Factors include: advanced age (>58men, >5 women);hypertension;dyslipidemia;smoking/ tobacco exposure;Other (see comment), Risk factor comments: CAD     Objective:    Today's Vitals   09/30/23 0931  Weight: 185 lb (83.9 kg)  Height: 5' 9 (1.753 m)   Body mass index is 27.32 kg/m.     09/30/2023    9:42 AM 06/05/2023    2:56 PM 04/08/2023   11:37 AM 03/08/2022   10:55 AM 08/07/2021    3:38 PM 08/01/2020    3:07 PM 07/24/2019   10:46 AM  Advanced Directives  Does Patient Have a Medical Advance Directive? Yes Yes No Yes Yes Yes Yes  Type of Estate agent of [place];Living will Healthcare Power of [place];Living will   Healthcare Power of  [place];Living will Healthcare Power of [place];Living will Healthcare Power of [place];Living will  Does patient want to make changes to medical advance directive?    No - Patient declined No - Patient declined No - Patient declined No - Patient declined  Copy of Healthcare Power of Attorney in Chart? No - copy requested    No - copy requested No - copy requested   Would patient like information on creating a medical advance directive?   No - Patient declined    No - Patient declined    Current Medications (verified) Outpatient Encounter Medications as of 09/30/2023  Medication Sig   albuterol  (VENTOLIN  HFA) 108 (90 Base) MCG/ACT inhaler Inhale 2 puffs into the lungs every 6 (six) hours as needed for wheezing or shortness of breath.   ALPRAZolam  (XANAX ) 0.25 MG tablet TAKE 1 TABLET EVERY DAY AS NEEDED FOR ANXIETY   amiodarone  (PACERONE ) 200 MG tablet TAKE 1/2 TABLET EVERY MORNING   amLODipine  (NORVASC ) 2.5 MG tablet Take 1 tablet (2.5 mg total) by mouth daily.   atorvastatin  (LIPITOR) 40 MG tablet Take 1 tablet (40 mg total) by mouth daily. *NEED OFFICE VISIT* TAKE 1 TABLET EVERY DAY   b complex vitamins capsule Take 1 capsule by mouth daily.   Cholecalciferol  1.25 MG (50000 UT) TABS 50,000 units PO qwk for 8 weeks.   Cyanocobalamin  (B-12) 1000 MCG SUBL Place 1,000 mcg under the tongue daily.   ELIQUIS  5 MG TABS tablet TAKE 1 TABLET BY MOUTH 2 TIMES A DAY   fluticasone  furoate-vilanterol (BREO ELLIPTA ) 100-25 MCG/ACT AEPB Inhale 1 puff into the lungs  daily.   gabapentin  (NEURONTIN ) 100 MG capsule Take 3 capsules (300 mg total) by mouth 2 (two) times daily.   metoprolol  succinate (TOPROL -XL) 50 MG 24 hr tablet Take 0.5 tablets (25 mg total) by mouth daily.   nitroGLYCERIN  (NITROSTAT ) 0.4 MG SL tablet Place 1 tablet (0.4 mg total) under the tongue every 5 (five) minutes x 3 doses as needed for chest pain.   pantoprazole  (PROTONIX ) 40 MG tablet Take 1 tablet (40 mg total) by mouth daily before  breakfast.   PARoxetine  (PAXIL ) 10 MG tablet Take 1 tablet (10 mg total) by mouth daily.   No facility-administered encounter medications on file as of 09/30/2023.    Allergies (verified) Hydrocodone and Oxycodone    History: Past Medical History:  Diagnosis Date   A-fib (HCC)    Arthritis    Chronic back pain    COPD (chronic obstructive pulmonary disease) (HCC)    Coronary artery disease    COVID-19    08/22/20 likely contracted late 07/2020   Glaucoma    Hearing aid worn    High cholesterol    Hypertension    Lymphadenopathy 01/25/2016   Myocardial infarction (HCC)    2000   Non Hodgkin's lymphoma (HCC)    Wears dentures     Top plate   Wears glasses    Past Surgical History:  Procedure Laterality Date   CARDIAC CATHETERIZATION     CATARACT EXTRACTION W/ INTRAOCULAR LENS IMPLANT     CHOLECYSTECTOMY     COLONOSCOPY W/ BIOPSIES AND POLYPECTOMY     CORONARY STENT PLACEMENT     INGUINAL LYMPH NODE BIOPSY Right 01/25/2016   Procedure: EXCISION DEEP RIGHT INGUINAL LYMPH NODE BIOPSY WITH ULTRA SOUND;  Surgeon: Elon Pacini, MD;  Location: [facility] OR;  Service: General;  Laterality: Right;   SPINE SURGERY     Family History  Problem Relation Age of Onset   Hypertension Mother    Stroke Mother    Heart disease Father    Heart disease Other    Social History   Socioeconomic History   Marital status: Single    Spouse name: Not on file   Number of children: Not on file   Years of education: Not on file   Highest education level: GED or equivalent  Occupational History   Not on file  Tobacco Use   Smoking status: Every Day    Current packs/day: 0.50    Average packs/day: 0.5 packs/day for 60.0 years (30.0 ttl pk-yrs)    Types: Cigarettes   Smokeless tobacco: Never   Tobacco comments:    3-4 cigs daily--04/23/2023  Vaping Use   Vaping status: Never Used  Substance and Sexual Activity   Alcohol use: Yes    Comment: social   Drug use: No   Sexual activity: Not  Currently  Other Topics Concern   Not on file  Social History Narrative   Not on file   Social Drivers of Health   Financial Resource Strain: Low Risk  (09/30/2023)   Overall Financial Resource Strain (CARDIA)    Difficulty of Paying Living Expenses: Not hard at all  Food Insecurity: No Food Insecurity (09/30/2023)   Hunger Vital Sign    Worried About Running Out of Food in the Last Year: Never true    Ran Out of Food in the Last Year: Never true  Transportation Needs: No Transportation Needs (09/30/2023)   PRAPARE - Transportation    Lack of Transportation (Medical): No    Lack of  Transportation (Non-Medical): No  Physical Activity: Inactive (09/30/2023)   Exercise Vital Sign    Days of Exercise per Week: 0 days    Minutes of Exercise per Session: 0 min  Stress: No Stress Concern Present (09/30/2023)   [employer] of Occupational Health - Occupational Stress Questionnaire    Feeling of Stress: Not at all  Social Connections: Socially Isolated (09/30/2023)   Social Connection and Isolation Panel    Frequency of Communication with Friends and Family: More than three times a week    Frequency of Social Gatherings with Friends and Family: More than three times a week    Attends Religious Services: Never    Database administrator or Organizations: No    Attends Banker Meetings: Never    Marital Status: Widowed    Tobacco Counseling Ready to quit: Not Answered Counseling given: Not Answered Tobacco comments: 3-4 cigs daily--04/23/2023    Clinical Intake:  Pre-visit preparation completed: Yes  Pain : No/denies pain     BMI - recorded: 27.32 Nutritional Status: BMI 25 -29 Overweight Nutritional Risks: None Diabetes: No  Lab Results  Component Value Date   HGBA1C 6.0 04/04/2023   HGBA1C 5.9 (A) 04/27/2022   HGBA1C 6.3 12/25/2021     How often do you need to have someone help you when you read instructions, pamphlets, or other written materials from  your doctor or pharmacy?: 1 - Never  Interpreter Needed?: No  Information entered by :: R. Escarlet Saathoff LPN   Activities of Daily Living     09/30/2023    9:32 AM 04/09/2023    6:45 AM  In your present state of health, do you have any difficulty performing the following activities:  Hearing? 1   Comment wears aids occassionally   Vision? 0   Comment glasses   Difficulty concentrating or making decisions? 1   Walking or climbing stairs? 1   Dressing or bathing? 0   Doing errands, shopping? 0 0  Preparing Food and eating ? N   Using the Toilet? N   In the past six months, have you accidently leaked urine? Y   Do you have problems with loss of bowel control? N   Managing your Medications? N   Managing your Finances? N   Housekeeping or managing your Housekeeping? N     Patient Care Team: Dineen Rollene MATSU, FNP as PCP - General (Family Medicine) Gollan, Timothy J, MD as PCP - Cardiology (Cardiology)  I have updated your Care Teams any recent Medical Services you may have received from other providers in the past year.     Assessment:   This is a routine wellness examination for [place].  Hearing/Vision screen Hearing Screening - Comments:: Wears aids occassionally Vision Screening - Comments:: glasses   Goals Addressed             This Visit's Progress    Patient Stated       Continue to stay active and eat well       Depression Screen     09/30/2023    9:38 AM 08/29/2023    1:34 PM 07/17/2023   11:24 AM 04/25/2023   12:10 PM 04/04/2023    1:28 PM 01/02/2023   12:11 PM 10/30/2022    2:02 PM  PHQ 2/9 Scores  PHQ - 2 Score 0 0 0 0 0 0 6  PHQ- 9 Score 0 0 0 0 0  23    Fall Risk  09/30/2023    9:35 AM 08/29/2023    1:33 PM 07/17/2023   11:24 AM 06/05/2023    2:56 PM 04/25/2023   12:10 PM  Fall Risk   Falls in the past year? 0 0 0 0 0  Number falls in past yr: 0 0 0  0  Injury with Fall? 0 0 0  0  Risk for fall due to : No Fall Risks No Fall Risks No Fall Risks No  Fall Risks No Fall Risks  Follow up Falls evaluation completed;Falls prevention discussed Falls evaluation completed Falls evaluation completed Falls evaluation completed Falls evaluation completed    MEDICARE RISK AT HOME:  Medicare Risk at Home Any stairs in or around the home?: Yes If so, are there any without handrails?: No Home free of loose throw rugs in walkways, pet beds, electrical cords, etc?: Yes Adequate lighting in your home to reduce risk of falls?: Yes Life alert?: No Use of a cane, walker or w/c?: Yes Grab bars in the bathroom?: No Shower chair or bench in shower?: Yes Elevated toilet seat or a handicapped toilet?: Yes  TIMED UP AND GO:  Was the test performed?  No  Cognitive Function: 6CIT completed        09/30/2023    9:42 AM 09/25/2022    3:41 PM 08/01/2020    3:39 PM  6CIT Screen  What Year? 0 points 0 points 0 points  What month? 0 points 0 points 0 points  What time? 0 points 0 points 0 points  Count back from 20 0 points 0 points 0 points  Months in reverse 4 points 4 points 0 points  Repeat phrase 4 points 4 points 0 points  Total Score 8 points 8 points 0 points    Immunizations Immunization History  Administered Date(s) Administered   Fluad Quad(high Dose 65+) 11/23/2020, 12/25/2021   Influenza, High Dose Seasonal PF 01/04/2017, 12/13/2017, 12/29/2018   Influenza-Unspecified 12/04/2013, 12/11/2022   Moderna Covid-19 Vaccine Bivalent Booster 63yrs & up 02/06/2021   Moderna SARS-COV2 Booster Vaccination 01/10/2020   PFIZER(Purple Top)SARS-COV-2 Vaccination 04/21/2019, 05/13/2019   Pneumococcal Conjugate-13 01/30/2016, 11/01/2018   Pneumococcal Polysaccharide-23 03/04/2017   Zoster Recombinant(Shingrix) 11/01/2018    Screening Tests Health Maintenance  Topic Date Due   COVID-19 Vaccine (4 - 2024-25 season) 11/18/2022   Medicare Annual Wellness (AWV)  09/25/2023   Zoster Vaccines- Shingrix (2 of 2) 11/29/2023 (Originally 12/27/2018)    DTaP/Tdap/Td (1 - Tdap) 08/28/2024 (Originally 12/29/1959)   INFLUENZA VACCINE  10/18/2023   Pneumococcal Vaccine: 50+ Years  Completed   Hepatitis B Vaccines  Aged Out   HPV VACCINES  Aged Out   Meningococcal B Vaccine  Aged Out   Lung Cancer Screening  Discontinued   DEXA SCAN  Discontinued   Hepatitis C Screening  Discontinued    Health Maintenance  Health Maintenance Due  Topic Date Due   COVID-19 Vaccine (4 - 2024-25 season) 11/18/2022   Medicare Annual Wellness (AWV)  09/25/2023   Health Maintenance Items Addressed: Patient declines Tetanus, and covid vaccines and Dexa. Patient stated that she will consider getting her second shingles vaccine.  Additional Screening:  Vision Screening: Recommended annual ophthalmology exams for early detection of glaucoma and other disorders of the eye. Up to date Dr. Carolee Would you like a referral to an eye doctor? No    Dental Screening: Recommended annual dental exams for proper oral hygiene  Community Resource Referral / Chronic Care Management: CRR required this visit?  No  CCM required this visit?  No   Plan:    I have personally reviewed and noted the following in the patient's chart:   Medical and social history Use of alcohol, tobacco or illicit drugs  Current medications and supplements including opioid prescriptions. Patient is not currently taking opioid prescriptions. Functional ability and status Nutritional status Physical activity Advanced directives List of other physicians Hospitalizations, surgeries, and ER visits in previous 12 months Vitals Screenings to include cognitive, depression, and falls Referrals and appointments  In addition, I have reviewed and discussed with patient certain preventive protocols, quality metrics, and best practice recommendations. A written personalized care plan for preventive services as well as general preventive health recommendations were provided to patient.   Angeline Fredericks, LPN   2/85/7974   After Visit Summary: (MyChart) Due to this being a telephonic visit, the after visit summary with patients personalized plan was offered to patient via MyChart   Notes: Nothing significant to report at this time.

## 2023-09-30 NOTE — Telephone Encounter (Signed)
 Samantha [name] at [facility] stated they do not need medical clearance, only a pharmacy clearance. Please advise. Thank you

## 2023-09-30 NOTE — Telephone Encounter (Signed)
 Patient with diagnosis of A Fib on Eliquis  for anticoagulation.    Procedure: Colon & EGD  Date of procedure: 10/10/23   CHA2DS2-VASc Score = 5  This indicates a 7.2% annual risk of stroke. The patient's score is based upon: CHF History: 0 HTN History: 1 Diabetes History: 0 Stroke History: 0 Vascular Disease History: 1 Age Score: 2 Gender Score: 1    CrCl 77 ml/min Platelet count 253K   Per office protocol, patient can hold Eliquis  for 2 days prior to procedure.    **This guidance is not considered finalized until pre-operative APP has relayed final recommendations.**

## 2023-09-30 NOTE — Telephone Encounter (Signed)
   Patient Name: Samantha [name]  DOB: 02/09/1941 MRN: [id number]  Primary Cardiologist: Evalene Lunger, MD  Clinical pharmacists have reviewed the patient's past medical history, labs, and current medications as part of preoperative protocol coverage. The following recommendations have been made:  Procedure: Colon & EGD  Date of procedure: 10/10/23   CHA2DS2-VASc Score = 5  This indicates a 7.2% annual risk of stroke. The patient's score is based upon: CHF History: 0 HTN History: 1 Diabetes History: 0 Stroke History: 0 Vascular Disease History: 1 Age Score: 2 Gender Score: 1   CrCl 77 ml/min Platelet count 253K   Per office protocol, patient can hold Eliquis  for 2 days prior to procedure.  Please resume as soon as safe to do so from a bleeding standpoint.    I will route this recommendation to the requesting party via Epic fax function and remove from pre-op pool.  Please call with questions.  Annastyn Silvey D Natasia Sanko, NP 09/30/2023, 9:36 AM

## 2023-09-30 NOTE — Patient Instructions (Signed)
 Samantha [name] , Thank you for taking time out of your busy schedule to complete your Annual Wellness Visit with me. I enjoyed our conversation and look forward to speaking with you again next year. I, as well as your care team,  appreciate your ongoing commitment to your health goals. Please review the following plan we discussed and let me know if I can assist you in the future. Your Game plan/ To Do List    Referrals: If you haven't heard from the office you've been referred to, please reach out to them at the phone provided.  Consider getting your second shingles vaccines.  Follow up Visits: Next Medicare AWV with our clinical staff: 10/05/24 @ 9:30   Have you seen your provider in the last 6 months (3 months if uncontrolled diabetes)? Yes Next Office Visit with your provider: 02/27/24  Clinician Recommendations:  Aim for 30 minutes of exercise or brisk walking, 6-8 glasses of water, and 5 servings of fruits and vegetables each day.       This is a list of the screening recommended for you and due dates:  Health Maintenance  Topic Date Due   COVID-19 Vaccine (4 - 2024-25 season) 11/18/2022   Zoster (Shingles) Vaccine (2 of 2) 11/29/2023*   DTaP/Tdap/Td vaccine (1 - Tdap) 08/28/2024*   Flu Shot  10/18/2023   Medicare Annual Wellness Visit  09/29/2024   Pneumococcal Vaccine for age over 5  Completed   Hepatitis B Vaccine  Aged Out   HPV Vaccine  Aged Out   Meningitis B Vaccine  Aged Out   Screening for Lung Cancer  Discontinued   DEXA scan (bone density measurement)  Discontinued   Hepatitis C Screening  Discontinued  *Topic was postponed. The date shown is not the original due date.    Advanced directives: (Copy Requested) Please bring a copy of your health care power of attorney and living will to the office to be added to your chart at your convenience. You can mail to [facility] [address] [place], [place] [address] or email to  [email or web address] Advance Care Planning is important because it:  [x]  Makes sure you receive the medical care that is consistent with your values, goals, and preferences  [x]  It provides guidance to your family and loved ones and reduces their decisional burden about whether or not they are making the right decisions based on your wishes.

## 2023-10-10 ENCOUNTER — Other Ambulatory Visit: Payer: Self-pay

## 2023-10-10 ENCOUNTER — Encounter: Payer: Self-pay | Admitting: Gastroenterology

## 2023-10-10 ENCOUNTER — Ambulatory Visit: Admitting: Anesthesiology

## 2023-10-10 ENCOUNTER — Ambulatory Visit
Admission: RE | Admit: 2023-10-10 | Discharge: 2023-10-10 | Disposition: A | Discharge status: Home / Self Care | Attending: Gastroenterology | Admitting: Gastroenterology

## 2023-10-10 ENCOUNTER — Encounter
Admission: RE | Disposition: A | Discharge status: Home / Self Care | Payer: Self-pay | Source: Home / Self Care | Attending: Gastroenterology

## 2023-10-10 DIAGNOSIS — D125 Benign neoplasm of sigmoid colon: Secondary | ICD-10-CM | POA: Diagnosis not present

## 2023-10-10 DIAGNOSIS — F32A Depression, unspecified: Secondary | ICD-10-CM | POA: Diagnosis not present

## 2023-10-10 DIAGNOSIS — K635 Polyp of colon: Secondary | ICD-10-CM | POA: Diagnosis not present

## 2023-10-10 DIAGNOSIS — G8929 Other chronic pain: Secondary | ICD-10-CM | POA: Insufficient documentation

## 2023-10-10 DIAGNOSIS — D124 Benign neoplasm of descending colon: Secondary | ICD-10-CM | POA: Insufficient documentation

## 2023-10-10 DIAGNOSIS — D120 Benign neoplasm of cecum: Secondary | ICD-10-CM | POA: Diagnosis not present

## 2023-10-10 DIAGNOSIS — I252 Old myocardial infarction: Secondary | ICD-10-CM | POA: Insufficient documentation

## 2023-10-10 DIAGNOSIS — F17210 Nicotine dependence, cigarettes, uncomplicated: Secondary | ICD-10-CM | POA: Insufficient documentation

## 2023-10-10 DIAGNOSIS — K5790 Diverticulosis of intestine, part unspecified, without perforation or abscess without bleeding: Secondary | ICD-10-CM | POA: Diagnosis not present

## 2023-10-10 DIAGNOSIS — K5730 Diverticulosis of large intestine without perforation or abscess without bleeding: Secondary | ICD-10-CM | POA: Insufficient documentation

## 2023-10-10 DIAGNOSIS — I4891 Unspecified atrial fibrillation: Secondary | ICD-10-CM | POA: Diagnosis not present

## 2023-10-10 DIAGNOSIS — J449 Chronic obstructive pulmonary disease, unspecified: Secondary | ICD-10-CM | POA: Insufficient documentation

## 2023-10-10 DIAGNOSIS — Z7901 Long term (current) use of anticoagulants: Secondary | ICD-10-CM | POA: Insufficient documentation

## 2023-10-10 DIAGNOSIS — I2510 Atherosclerotic heart disease of native coronary artery without angina pectoris: Secondary | ICD-10-CM | POA: Diagnosis not present

## 2023-10-10 DIAGNOSIS — C186 Malignant neoplasm of descending colon: Secondary | ICD-10-CM | POA: Diagnosis not present

## 2023-10-10 DIAGNOSIS — K3189 Other diseases of stomach and duodenum: Secondary | ICD-10-CM | POA: Insufficient documentation

## 2023-10-10 DIAGNOSIS — D500 Iron deficiency anemia secondary to blood loss (chronic): Secondary | ICD-10-CM | POA: Insufficient documentation

## 2023-10-10 DIAGNOSIS — D122 Benign neoplasm of ascending colon: Secondary | ICD-10-CM | POA: Diagnosis not present

## 2023-10-10 DIAGNOSIS — Z7951 Long term (current) use of inhaled steroids: Secondary | ICD-10-CM | POA: Insufficient documentation

## 2023-10-10 DIAGNOSIS — I10 Essential (primary) hypertension: Secondary | ICD-10-CM | POA: Insufficient documentation

## 2023-10-10 DIAGNOSIS — K5520 Angiodysplasia of colon without hemorrhage: Secondary | ICD-10-CM | POA: Diagnosis not present

## 2023-10-10 DIAGNOSIS — F419 Anxiety disorder, unspecified: Secondary | ICD-10-CM | POA: Diagnosis not present

## 2023-10-10 DIAGNOSIS — D123 Benign neoplasm of transverse colon: Secondary | ICD-10-CM | POA: Diagnosis not present

## 2023-10-10 DIAGNOSIS — Z8572 Personal history of non-Hodgkin lymphomas: Secondary | ICD-10-CM | POA: Diagnosis not present

## 2023-10-10 HISTORY — PX: ESOPHAGOGASTRODUODENOSCOPY: SHX5428

## 2023-10-10 HISTORY — PX: POLYPECTOMY: SHX149

## 2023-10-10 HISTORY — PX: COLONOSCOPY: SHX5424

## 2023-10-10 HISTORY — DX: Depression, unspecified: F32.A

## 2023-10-10 HISTORY — DX: Anxiety disorder, unspecified: F41.9

## 2023-10-10 SURGERY — EGD (ESOPHAGOGASTRODUODENOSCOPY)
Anesthesia: General

## 2023-10-10 MED ORDER — GLYCOPYRROLATE 0.2 MG/ML IJ SOLN
INTRAMUSCULAR | Status: DC | PRN
  Administered 2023-10-10 (×2): .2 mg via INTRAVENOUS

## 2023-10-10 MED ORDER — GLYCOPYRROLATE 0.2 MG/ML IJ SOLN
INTRAMUSCULAR | Status: AC
  Filled 2023-10-10: qty 1

## 2023-10-10 MED ORDER — SODIUM CHLORIDE 0.9 % IV SOLN
INTRAVENOUS | Status: DC
Start: 2023-10-10 — End: 2023-10-10

## 2023-10-10 MED ORDER — PROPOFOL 10 MG/ML IV BOLUS
INTRAVENOUS | Status: DC | PRN
  Administered 2023-10-10: 40 mg via INTRAVENOUS
  Administered 2023-10-10: 20 mg via INTRAVENOUS

## 2023-10-10 MED ORDER — LIDOCAINE HCL (PF) 2 % IJ SOLN
INTRAMUSCULAR | Status: AC
Start: 2023-10-10 — End: 2023-10-10
  Filled 2023-10-10: qty 5

## 2023-10-10 MED ORDER — PROPOFOL 1000 MG/100ML IV EMUL
INTRAVENOUS | Status: AC
  Filled 2023-10-10: qty 100

## 2023-10-10 MED ORDER — PROPOFOL 500 MG/50ML IV EMUL
INTRAVENOUS | Status: DC | PRN
  Administered 2023-10-10: 50 ug/kg/min via INTRAVENOUS

## 2023-10-10 MED ORDER — DEXMEDETOMIDINE HCL IN NACL 80 MCG/20ML IV SOLN
INTRAVENOUS | Status: AC
  Filled 2023-10-10: qty 20

## 2023-10-10 MED ORDER — SPOT INK MARKER SYRINGE KIT
PACK | SUBMUCOSAL | Status: DC | PRN
  Administered 2023-10-10: 3 mL via SUBMUCOSAL

## 2023-10-10 MED ORDER — LIDOCAINE HCL (CARDIAC) PF 100 MG/5ML IV SOSY
PREFILLED_SYRINGE | INTRAVENOUS | Status: DC | PRN
  Administered 2023-10-10: 60 mg via INTRAVENOUS

## 2023-10-10 MED ORDER — EPHEDRINE SULFATE-NACL 50-0.9 MG/10ML-% IV SOSY
PREFILLED_SYRINGE | INTRAVENOUS | Status: DC | PRN
  Administered 2023-10-10: 15 mg via INTRAVENOUS

## 2023-10-10 NOTE — Transfer of Care (Signed)
 Immediate Anesthesia Transfer of Care Note  Patient: Samantha [name]  Procedure(s) Performed: EGD (ESOPHAGOGASTRODUODENOSCOPY) COLONOSCOPY POLYPECTOMY, INTESTINE  Patient Location: PACU  Anesthesia Type:General  Level of Consciousness: sedated  Airway & Oxygen Therapy: Patient Spontanous Breathing  Post-op Assessment: Report given to RN and Post -op Vital signs reviewed and stable  Post vital signs: Reviewed and stable  Last Vitals:  Vitals Value Taken Time  BP 112/43 10/10/23 08:48  Temp    Pulse 52 10/10/23 08:49  Resp 17 10/10/23 08:49  SpO2 99 % 10/10/23 08:49  Vitals shown include unfiled device data.  Last Pain:  Vitals:   10/10/23 0845  TempSrc: Temporal  PainSc: 0-No pain         Complications: No notable events documented.

## 2023-10-10 NOTE — Anesthesia Preprocedure Evaluation (Addendum)
 Anesthesia Evaluation  Patient identified by MRN, date of birth, ID band Patient awake    Reviewed: Allergy & Precautions, NPO status , Patient's Chart, lab work & pertinent test results  History of Anesthesia Complications Negative for: history of anesthetic complications  Airway Mallampati: III   Neck ROM: Full    Dental  (+) Upper Dentures, Missing   Pulmonary COPD, Current Smoker (5 cigarettes per day)Patient did not abstain from smoking.   Pulmonary exam normal breath sounds clear to auscultation       Cardiovascular hypertension, + CAD (s/p MI and stents)  Normal cardiovascular exam+ dysrhythmias (a fib on Eliquis )  Rhythm:Regular Rate:Normal  ECG 07/17/23: Junctional rhythm; LAD; ST and T abnomality   Neuro/Psych  PSYCHIATRIC DISORDERS Anxiety Depression    Chronic pain; HOH    GI/Hepatic negative GI ROS,,,  Endo/Other  negative endocrine ROS    Renal/GU negative Renal ROS     Musculoskeletal  (+) Arthritis ,    Abdominal   Peds  Hematology Non-Hodgkin lymphoma   Anesthesia Other Findings Cardiology note 10/29/22:  CAD with stable angina Denies chest pain and arm pain concerning for angina Shortness of breath likely from deconditioning, long smoking history walking in the heat Gust anginal symptoms to watch for   Hyperlipidemia Previously at goal, prefers to have lab work done tomorrow with primary care Goal LDL less than 70 preferably less than 55 Was at goal June 2023   HTN Blood pressure is well controlled on today's visit. No changes made to the medications.   Atrial fib maintaining NSR, Continue low-dose amiodarone , metoprolol  and Eliquis    Sweating/Flushing Etiology unclear, she feels that his hormonal issues Scheduled to see primary care tomorrow Likely infection, less likely cardiac etiology   Reproductive/Obstetrics                              Anesthesia  Physical Anesthesia Plan  ASA: 3  Anesthesia Plan: General   Post-op Pain Management:    Induction: Intravenous  PONV Risk Score and Plan: 2 and Propofol  infusion, TIVA and Treatment may vary due to age or medical condition  Airway Management Planned: Natural Airway  Additional Equipment:   Intra-op Plan:   Post-operative Plan:   Informed Consent: I have reviewed the patients History and Physical, chart, labs and discussed the procedure including the risks, benefits and alternatives for the proposed anesthesia with the patient or authorized representative who has indicated his/her understanding and acceptance.   Patient has DNR.  Discussed DNR with patient and Continue DNR.     Plan Discussed with: CRNA  Anesthesia Plan Comments: (LMA/GETA backup discussed.  Patient consented for risks of anesthesia including but not limited to:  - adverse reactions to medications - damage to eyes, teeth, lips or other oral mucosa - nerve damage due to positioning  - sore throat or hoarseness - damage to heart, brain, nerves, lungs, other parts of body or loss of life  Informed patient about role of CRNA in peri- and intra-operative care.  Patient voiced understanding.)         Anesthesia Quick Evaluation

## 2023-10-10 NOTE — H&P (Signed)
 Corinn JONELLE Brooklyn, MD [facility], DHIP [address]  [place], [place] [address]  Main: [phone] Fax:  [phone] Pager: [phone]   Primary Care Physician:  Dineen Rollene MATSU, FNP Primary Gastroenterologist:  Dr. Corinn JONELLE Brooklyn  Pre-Procedure History & Physical: HPI:  Samantha [name] is a 83 y.o. female is here for an endoscopy and colonoscopy.   Past Medical History:  Diagnosis Date   A-fib [facility])    Anxiety    Arthritis    Chronic back pain    COPD (chronic obstructive pulmonary disease) (HCC)    Coronary artery disease    COVID-19    08/22/20 likely contracted late 07/2020   Depression    Glaucoma    Hearing aid worn    High cholesterol    Hypertension    Lymphadenopathy 01/25/2016   Myocardial infarction (HCC)    2000   Non Hodgkin's lymphoma (HCC)    Wears dentures     Top plate   Wears glasses     Past Surgical History:  Procedure Laterality Date   CARDIAC CATHETERIZATION     CATARACT EXTRACTION W/ INTRAOCULAR LENS IMPLANT     CHOLECYSTECTOMY     COLONOSCOPY W/ BIOPSIES AND POLYPECTOMY     CORONARY STENT PLACEMENT     INGUINAL LYMPH NODE BIOPSY Right 01/25/2016   Procedure: EXCISION DEEP RIGHT INGUINAL LYMPH NODE BIOPSY WITH ULTRA SOUND;  Surgeon: Elon Pacini, MD;  Location: [facility] OR;  Service: General;  Laterality: Right;   SPINE SURGERY      Prior to Admission medications   Medication Sig Start Date End Date Taking? Authorizing Provider  albuterol  (VENTOLIN  HFA) 108 (90 Base) MCG/ACT inhaler Inhale 2 puffs into the lungs every 6 (six) hours as needed for wheezing or shortness of breath. 08/07/23  Yes Assaker, Darrin, MD  ALPRAZolam  (XANAX ) 0.25 MG tablet TAKE 1 TABLET EVERY DAY AS NEEDED FOR ANXIETY 08/14/22  Yes Dineen Rollene MATSU, FNP  amiodarone  (PACERONE ) 200 MG tablet TAKE 1/2 TABLET EVERY MORNING 12/10/22  Yes Gollan, Timothy J, MD  atorvastatin  (LIPITOR) 40 MG tablet Take 1 tablet (40 mg total) by mouth daily.  *NEED OFFICE VISIT* TAKE 1 TABLET EVERY DAY 04/04/23  Yes Arnett, Rollene MATSU, FNP  b complex vitamins capsule Take 1 capsule by mouth daily. 05/27/23  Yes Onesimo Emaline Brink, MD  ELIQUIS  5 MG TABS tablet TAKE 1 TABLET BY MOUTH 2 TIMES A DAY 06/05/23  Yes Gollan, Timothy J, MD  gabapentin  (NEURONTIN ) 100 MG capsule Take 3 capsules (300 mg total) by mouth 2 (two) times daily. 08/29/23  Yes Dineen Rollene MATSU, FNP  metoprolol  succinate (TOPROL -XL) 50 MG 24 hr tablet Take 0.5 tablets (25 mg total) by mouth daily. 07/17/23  Yes Dineen Rollene MATSU, FNP  pantoprazole  (PROTONIX ) 40 MG tablet Take 1 tablet (40 mg total) by mouth daily before breakfast. 04/10/23  Yes Cherlyn Labella, MD  PARoxetine  (PAXIL ) 10 MG tablet Take 1 tablet (10 mg total) by mouth daily. 02/13/23  Yes Arnett, Rollene MATSU, FNP  amLODipine  (NORVASC ) 2.5 MG tablet Take 1 tablet (2.5 mg total) by mouth daily. 07/17/23   Dineen Rollene MATSU, FNP  Cholecalciferol  1.25 MG (50000 UT) TABS 50,000 units PO qwk for 8 weeks. 04/08/23   Dineen Rollene MATSU, FNP  Cyanocobalamin  (B-12) 1000 MCG SUBL Place 1,000 mcg under the tongue daily. 05/27/23   Onesimo Emaline Brink, MD  fluticasone  furoate-vilanterol (BREO ELLIPTA ) 100-25 MCG/ACT AEPB Inhale 1 puff into the lungs daily.  04/23/23   Assaker, Darrin, MD  nitroGLYCERIN  (NITROSTAT ) 0.4 MG SL tablet Place 1 tablet (0.4 mg total) under the tongue every 5 (five) minutes x 3 doses as needed for chest pain. 10/12/21   Gollan, Timothy J, MD    Allergies as of 09/26/2023 - Review Complete 08/29/2023  Allergen Reaction Noted   Hydrocodone Rash 08/05/2013   Oxycodone  Rash 09/19/2015    Family History  Problem Relation Age of Onset   Hypertension Mother    Stroke Mother    Heart disease Father    Heart disease Other     Social History   Socioeconomic History   Marital status: Single    Spouse name: Not on file   Number of children: Not on file   Years of education: Not on file   Highest education  level: GED or equivalent  Occupational History   Not on file  Tobacco Use   Smoking status: Every Day    Current packs/day: 0.50    Average packs/day: 0.5 packs/day for 60.0 years (30.0 ttl pk-yrs)    Types: Cigarettes   Smokeless tobacco: Never   Tobacco comments:    3-4 cigs daily--04/23/2023  Vaping Use   Vaping status: Never Used  Substance and Sexual Activity   Alcohol use: Yes    Comment: social   Drug use: No   Sexual activity: Not Currently  Other Topics Concern   Not on file  Social History Narrative   Not on file   Social Drivers of Health   Financial Resource Strain: Low Risk  (09/30/2023)   Overall Financial Resource Strain (CARDIA)    Difficulty of Paying Living Expenses: Not hard at all  Food Insecurity: No Food Insecurity (09/30/2023)   Hunger Vital Sign    Worried About Running Out of Food in the Last Year: Never true    Ran Out of Food in the Last Year: Never true  Transportation Needs: No Transportation Needs (09/30/2023)   PRAPARE - Administrator, Civil Service (Medical): No    Lack of Transportation (Non-Medical): No  Physical Activity: Inactive (09/30/2023)   Exercise Vital Sign    Days of Exercise per Week: 0 days    Minutes of Exercise per Session: 0 min  Stress: No Stress Concern Present (09/30/2023)   [employer] of Occupational Health - Occupational Stress Questionnaire    Feeling of Stress: Not at all  Social Connections: Socially Isolated (09/30/2023)   Social Connection and Isolation Panel    Frequency of Communication with Friends and Family: More than three times a week    Frequency of Social Gatherings with Friends and Family: More than three times a week    Attends Religious Services: Never    Database administrator or Organizations: No    Attends Banker Meetings: Never    Marital Status: Widowed  Intimate Partner Violence: Not At Risk (09/30/2023)   Humiliation, Afraid, Rape, and Kick questionnaire    Fear  of Current or Ex-Partner: No    Emotionally Abused: No    Physically Abused: No    Sexually Abused: No    Review of Systems: See HPI, otherwise negative ROS  Physical Exam: BP (!) 153/55   Pulse (!) 50   Temp 98 F (36.7 C) (Oral)   Resp 16   Ht 5' 9 (1.753 m)   Wt 83.2 kg   SpO2 98%   BMI 27.08 kg/m  General:   Alert,  pleasant and cooperative  in NAD Head:  Normocephalic and atraumatic. Neck:  Supple; no masses or thyromegaly. Lungs:  Clear throughout to auscultation.    Heart:  Regular rate and rhythm. Abdomen:  Soft, nontender and nondistended. Normal bowel sounds, without guarding, and without rebound.   Neurologic:  Alert and  oriented x4;  grossly normal neurologically.  Impression/Plan: Samantha [name] is here for an endoscopy and colonoscopy to be performed for IDA  Risks, benefits, limitations, and alternatives regarding  endoscopy and colonoscopy have been reviewed with the patient.  Questions have been answered.  All parties agreeable.   Corinn Brooklyn, MD  10/10/2023, 7:38 AM

## 2023-10-10 NOTE — Op Note (Signed)
 [facility] Patient Name: Samantha [name] Procedure Date: 10/10/2023 7:43 AM MRN: [id number] Account #: [id number] Date of Birth: Sep 12, 1940 Admit Type: Outpatient Age: 83 Room: [facility] ENDO ROOM 3 Gender: Female Note Status: Finalized Instrument Name: Colonoscope [id number] Procedure:             Colonoscopy Indications:           Last colonoscopy: November 2012, Iron  deficiency                         anemia secondary to chronic blood loss Providers:             Corinn Jess Brooklyn MD, MD Referring MD:          [name] (Referring MD) Medicines:             General Anesthesia Complications:         No immediate complications. Estimated blood loss:                         Minimal. Procedure:             Pre-Anesthesia Assessment:                        - Prior to the procedure, a History and Physical was                         performed, and patient medications and allergies were                         reviewed. The patient is competent. The risks and                         benefits of the procedure and the sedation options and                         risks were discussed with the patient. All questions                         were answered and informed consent was obtained.                         Patient identification and proposed procedure were                         verified by the physician, the nurse, the                         anesthesiologist, the anesthetist and the technician                         in the pre-procedure area in the procedure room in the                         endoscopy suite. Mental Status Examination: alert and                         oriented. Airway Examination: normal oropharyngeal  airway and neck mobility. Respiratory Examination:                         clear to auscultation. CV Examination: normal.                         Prophylactic Antibiotics: The patient does not require                          prophylactic antibiotics. Prior Anticoagulants: The                         patient has taken Eliquis  (apixaban ), last dose was 5                         days prior to procedure. ASA Grade Assessment: III - A                         patient with severe systemic disease. After reviewing                         the risks and benefits, the patient was deemed in                         satisfactory condition to undergo the procedure. The                         anesthesia plan was to use general anesthesia.                         Immediately prior to administration of medications,                         the patient was re-assessed for adequacy to receive                         sedatives. The heart rate, respiratory rate, oxygen                         saturations, blood pressure, adequacy of pulmonary                         ventilation, and response to care were monitored                         throughout the procedure. The physical status of the                         patient was re-assessed after the procedure.                        After obtaining informed consent, the colonoscope was                         passed under direct vision. Throughout the procedure,                         the patient's blood pressure, pulse, and oxygen  saturations were monitored continuously. The                         Colonoscope was introduced through the anus and                         advanced to the the cecum, identified by appendiceal                         orifice and ileocecal valve. The colonoscopy was                         performed with moderate difficulty due to significant                         looping and the patient's body habitus. Successful                         completion of the procedure was aided by applying                         abdominal pressure. The patient tolerated the                         procedure well. The quality of the  bowel preparation                         was good. The ileocecal valve, appendiceal orifice,                         and rectum were photographed. Findings:      The perianal and digital rectal examinations were normal. Pertinent       negatives include normal sphincter tone and no palpable rectal lesions.      Two medium-sized angioectasias without bleeding were found in the cecum.       Coagulation for hemostasis using argon plasma was successful. Estimated       blood loss: none.      Two sessile polyps were found in the cecum. The polyps were 3 to 4 mm in       size. These polyps were removed with a cold snare. Resection and       retrieval were complete.      Three sessile polyps were found in the ascending colon. The polyps were       3 to 5 mm in size. These polyps were removed with a cold snare.       Resection and retrieval were complete. Estimated blood loss was minimal.      Three sessile polyps were found in the transverse colon. The polyps were       5 to 6 mm in size. These polyps were removed with a cold snare.       Resection and retrieval were complete. Estimated blood loss was minimal.      A 7 mm polyp was found in the descending colon. The polyp was sessile.       The polyp was removed with a cold snare. Resection and retrieval were       complete. Estimated blood loss was minimal.      An infiltrative, submucosal and ulcerated non-obstructing large mass  was       found in the descending colon and at 60 cm proximal to the anus. The       mass was partially circumferential (involving one-half of the lumen       circumference). No bleeding was present. Biopsies were taken with a cold       forceps for histology. Area was tattooed with an injection of Spot       (carbon black).      Three sessile polyps were found in the sigmoid colon and descending       colon. The polyps were 3 to 10 mm in size. Polypectomy was not attempted.      The retroflexed view of the distal  rectum and anal verge was normal and       showed no anal or rectal abnormalities.      Multiple diverticula were found in the sigmoid colon. Impression:            - Two non-bleeding colonic angioectasias. Treated with                         argon plasma coagulation (APC).                        - Two 3 to 4 mm polyps in the cecum, removed with a                         cold snare. Resected and retrieved.                        - Three 3 to 5 mm polyps in the ascending colon,                         removed with a cold snare. Resected and retrieved.                        - Three 5 to 6 mm polyps in the transverse colon,                         removed with a cold snare. Resected and retrieved.                        - One 7 mm polyp in the descending colon, removed with                         a cold snare. Resected and retrieved.                        - Likely malignant tumor in the descending colon and                         at 60 cm proximal to the anus. Biopsied. Tattooed.                        - Three 3 to 10 mm polyps in the sigmoid colon and in                         the descending colon. Resection not attempted.                        -  The distal rectum and anal verge are normal on                         retroflexion view.                        - Diverticulosis in the sigmoid colon. Recommendation:        - Discharge patient to home (with escort).                        - Resume previous diet today.                        - Continue present medications.                        - Await pathology results.                        - Resume Eliquis  (apixaban ) at prior dose in 3 days.                         Refer to managing physician for further adjustment of                         therapy.                        - Refer to an oncologist at appointment to be                         scheduled. Procedure Code(s):     --- Professional ---                        [phone], 59,  Colonoscopy, flexible; with control of                         bleeding, any method                        45385, Colonoscopy, flexible; with removal of                         tumor(s), polyp(s), or other lesion(s) by snare                         technique                        45380, 59, Colonoscopy, flexible; with biopsy, single                         or multiple                        45381, 59, Colonoscopy, flexible; with directed                         submucosal injection(s), any substance Diagnosis Code(s):     --- Professional ---  K55.20, Angiodysplasia of colon without hemorrhage                        D12.0, Benign neoplasm of cecum                        D12.2, Benign neoplasm of ascending colon                        D12.3, Benign neoplasm of transverse colon (hepatic                         flexure or splenic flexure)                        D12.5, Benign neoplasm of sigmoid colon                        D49.0, Neoplasm of unspecified behavior of digestive                         system                        D12.4, Benign neoplasm of descending colon                        D50.0, Iron  deficiency anemia secondary to blood loss                         (chronic)                        K57.30, Diverticulosis of large intestine without                         perforation or abscess without bleeding CPT copyright 2022 American Medical Association. All rights reserved. The codes documented in this report are preliminary and upon coder review may  be revised to meet current compliance requirements. Dr. Corinn Brooklyn Corinn Jess Brooklyn MD, MD 10/10/2023 8:47:46 AM This report has been signed electronically. Number of Addenda: 0 Note Initiated On: 10/10/2023 7:43 AM Scope Withdrawal Time: 0 hours 29 minutes 40 seconds  Total Procedure Duration: 0 hours 37 minutes 6 seconds  Estimated Blood Loss:  Estimated blood loss was minimal.      [facility]

## 2023-10-10 NOTE — Anesthesia Postprocedure Evaluation (Signed)
 Anesthesia Post Note  Patient: Samantha [name]  Procedure(s) Performed: EGD (ESOPHAGOGASTRODUODENOSCOPY) COLONOSCOPY POLYPECTOMY, INTESTINE  Patient location during evaluation: PACU Anesthesia Type: General Level of consciousness: awake and alert, oriented and patient cooperative Pain management: pain level controlled Vital Signs Assessment: post-procedure vital signs reviewed and stable Respiratory status: spontaneous breathing, nonlabored ventilation and respiratory function stable Cardiovascular status: blood pressure returned to baseline and stable Postop Assessment: adequate PO intake Anesthetic complications: no   No notable events documented.   Last Vitals:  Vitals:   10/10/23 0855 10/10/23 0905  BP: (!) 126/41 (!) 171/49  Pulse: (!) 55   Resp: 17 13  Temp:    SpO2: 100% 98%    Last Pain:  Vitals:   10/10/23 0855  TempSrc:   PainSc: 0-No pain                 Alfonso Ruths

## 2023-10-10 NOTE — Op Note (Signed)
 [facility] Patient Name: Samantha [name] Procedure Date: 10/10/2023 7:44 AM MRN: [id number] Account #: [id number] Date of Birth: Jun 29, 1940 Admit Type: Outpatient Age: 83 Room: [facility] ENDO ROOM 3 Gender: Female Note Status: Finalized Instrument Name: Barnie Endoscope [id number] Procedure:             Upper GI endoscopy Indications:           Unexplained iron  deficiency anemia Providers:             Corinn Jess Brooklyn MD, MD Referring MD:          [name] (Referring MD) Medicines:             General Anesthesia Complications:         No immediate complications. Estimated blood loss: None. Procedure:             Pre-Anesthesia Assessment:                        - Prior to the procedure, a History and Physical was                         performed, and patient medications and allergies were                         reviewed. The patient is competent. The risks and                         benefits of the procedure and the sedation options and                         risks were discussed with the patient. All questions                         were answered and informed consent was obtained.                         Patient identification and proposed procedure were                         verified by the physician, the nurse, the                         anesthesiologist, the anesthetist and the technician                         in the pre-procedure area in the procedure room in the                         endoscopy suite. Mental Status Examination: alert and                         oriented. Airway Examination: normal oropharyngeal                         airway and neck mobility. Respiratory Examination:                         clear to auscultation. CV Examination: normal.  Prophylactic Antibiotics: The patient does not require                         prophylactic antibiotics. Prior Anticoagulants: The                          patient has taken Eliquis  (apixaban ), last dose was 3                         days prior to procedure. ASA Grade Assessment: III - A                         patient with severe systemic disease. After reviewing                         the risks and benefits, the patient was deemed in                         satisfactory condition to undergo the procedure. The                         anesthesia plan was to use general anesthesia.                         Immediately prior to administration of medications,                         the patient was re-assessed for adequacy to receive                         sedatives. The heart rate, respiratory rate, oxygen                         saturations, blood pressure, adequacy of pulmonary                         ventilation, and response to care were monitored                         throughout the procedure. The physical status of the                         patient was re-assessed after the procedure.                        After obtaining informed consent, the endoscope was                         passed under direct vision. Throughout the procedure,                         the patient's blood pressure, pulse, and oxygen                         saturations were monitored continuously. The Endoscope                         was introduced through the mouth, and advanced to  the                         second part of duodenum. The upper GI endoscopy was                         accomplished without difficulty. The patient tolerated                         the procedure well. Findings:      The duodenal bulb and second portion of the duodenum were normal.      Striped mildly erythematous mucosa without bleeding was found in the       gastric antrum. Biopsies were taken with a cold forceps for histology.      The gastric body and incisura were normal. Biopsies were taken with a       cold forceps for histology.      The cardia and gastric fundus were  normal on retroflexion.      The gastroesophageal junction and examined esophagus were normal.      Esophagogastric landmarks were identified: the gastroesophageal junction       was found at 40 cm from the incisors. Impression:            - Normal duodenal bulb and second portion of the                         duodenum.                        - Erythematous mucosa in the antrum. Biopsied.                        - Normal gastric body and incisura. Biopsied.                        - Normal gastroesophageal junction and esophagus.                        - Esophagogastric landmarks identified. Recommendation:        - Await pathology results.                        - Proceed with colonoscopy as scheduled                        See colonoscopy report Procedure Code(s):     --- Professional ---                        [phone], Esophagogastroduodenoscopy, flexible,                         transoral; with biopsy, single or multiple Diagnosis Code(s):     --- Professional ---                        K31.89, Other diseases of stomach and duodenum                        D50.9, Iron  deficiency anemia, unspecified CPT copyright 2022 American Medical Association. All rights reserved. The codes documented in this report are preliminary and  upon coder review may  be revised to meet current compliance requirements. Dr. Corinn Brooklyn Corinn Jess Brooklyn MD, MD 10/10/2023 8:02:09 AM This report has been signed electronically. Number of Addenda: 0 Note Initiated On: 10/10/2023 7:44 AM Estimated Blood Loss:  Estimated blood loss: none. Estimated blood loss: none.      [facility]

## 2023-10-11 ENCOUNTER — Encounter: Payer: Self-pay | Admitting: Gastroenterology

## 2023-10-11 LAB — SURGICAL PATHOLOGY

## 2023-10-13 ENCOUNTER — Other Ambulatory Visit: Payer: Self-pay | Admitting: Family

## 2023-10-13 DIAGNOSIS — M79672 Pain in left foot: Secondary | ICD-10-CM

## 2023-10-18 DIAGNOSIS — C189 Malignant neoplasm of colon, unspecified: Secondary | ICD-10-CM

## 2023-10-18 HISTORY — DX: Malignant neoplasm of colon, unspecified: C18.9

## 2023-10-28 NOTE — Progress Notes (Signed)
 Cardiology Office Note  Date:  10/29/2023   ID:  LURENE ROBLEY, DOB [birth year], MRN [id number]  PCP:  Dineen Rollene MATSU, FNP   Chief Complaint  Patient presents with   12 month follow up     Doing well.  Patient c/o difficulties with a decrease in her iron ; had 7 iron  infusions.     HPI:  Ms Natalye Kott is 83 yo woman with PMH of  Follicular lymphoma, chemo Paroxysmal atrial fibrillation Smokes 1/2 ppd CAD prior stent, 2001, mid RCA, left chest and arm pain Back surgery, lower Previously seen in [place] Presents for f/u of her coronary disease, stable angina, paroxysmal atrial fibrillation  LOV with myself 8/24  Reports having recent history of anemia Numbers reviewed hemoglobin down in the 8 range, most recently running 11.9 after iron  infusion  She reports diagnosis of cancer in colon, has oncology follow-up  Tries to stay active, denies chest pain or shortness of breath on exertion No tachycardia or palpitations concerning for atrial fibrillation  She does have some shortness of breath if she walks long distances at fast pace, recovers quickly with resting Continues to smoke 1/2 pack/day,   Works with a cane, no falls Lives alone, does her ADLs  Covid 07/2020  EKG personally reviewed by myself on todays visit EKG Interpretation Date/Time:  Tuesday October 29 2023 10:46:10 EDT Ventricular Rate:  53 PR Interval:    QRS Duration:  94 QT Interval:  468 QTC Calculation: 439 R Axis:   -31  Text Interpretation: Sinus bradycardia Left axis deviation Nonspecific ST and T wave abnormality When compared with ECG of 08-Apr-2023 14:57, No significant change was found Confirmed by Perla Lye [phone]) on 10/29/2023 11:03:39 AM   Other past medical history reviewed Cardiac cath: 2001,  Had left arm pain, chest pain Stent to RCA Has had cath since then, details unclear (couple times)  Echo 2017:normal EF  CT chest 2019 for lymphoma 1. Stable mild retroperitoneal  adenopathy, compatible with treated lymphoma. No new or progressive adenopathy. No new sites of disease. Normal size spleen. 2. Three-vessel coronary atherosclerosis. 3.  Aortic Atherosclerosis   In 12/25 Total chol 94, LDL 45 A1C 6.0  EKG personally reviewed by myself on todays visit EKG Interpretation Date/Time:  Tuesday October 29 2023 10:46:10 EDT Ventricular Rate:  53 PR Interval:    QRS Duration:  94 QT Interval:  468 QTC Calculation: 439 R Axis:   -31  Text Interpretation: Sinus bradycardia Left axis deviation Nonspecific ST and T wave abnormality When compared with ECG of 08-Apr-2023 14:57, No significant change was found Confirmed by Perla Lye [phone]) on 10/29/2023 11:03:39 AM    PMH:   has a past medical history of A-fib (HCC), Anxiety, Arthritis, Chronic back pain, COPD (chronic obstructive pulmonary disease) (HCC), Coronary artery disease, COVID-19, Depression, Glaucoma, Hearing aid worn, High cholesterol, Hypertension, Lymphadenopathy (01/25/2016), Myocardial infarction (HCC), Non Hodgkin's lymphoma (HCC), Wears dentures, and Wears glasses.  PSH:    Past Surgical History:  Procedure Laterality Date   CARDIAC CATHETERIZATION     CATARACT EXTRACTION W/ INTRAOCULAR LENS IMPLANT     CHOLECYSTECTOMY     COLONOSCOPY N/A 10/10/2023   Procedure: COLONOSCOPY;  Surgeon: Unk Corinn Skiff, MD;  Location: [facility] ENDOSCOPY;  Service: Gastroenterology;  Laterality: N/A;   COLONOSCOPY W/ BIOPSIES AND POLYPECTOMY     CORONARY STENT PLACEMENT     ESOPHAGOGASTRODUODENOSCOPY N/A 10/10/2023   Procedure: EGD (ESOPHAGOGASTRODUODENOSCOPY);  Surgeon: Unk Corinn Skiff, MD;  Location: [facility] ENDOSCOPY;  Service: Gastroenterology;  Laterality: N/A;   INGUINAL LYMPH NODE BIOPSY Right 01/25/2016   Procedure: EXCISION DEEP RIGHT INGUINAL LYMPH NODE BIOPSY WITH ULTRA SOUND;  Surgeon: Elon Pacini, MD;  Location: [facility] OR;  Service: General;  Laterality: Right;   POLYPECTOMY  10/10/2023    Procedure: POLYPECTOMY, INTESTINE;  Surgeon: Unk Corinn Skiff, MD;  Location: [facility] ENDOSCOPY;  Service: Gastroenterology;;   SPINE SURGERY      Current Outpatient Medications  Medication Sig Dispense Refill   albuterol  (VENTOLIN  HFA) 108 (90 Base) MCG/ACT inhaler Inhale 2 puffs into the lungs every 6 (six) hours as needed for wheezing or shortness of breath. 8 g 2   ALPRAZolam  (XANAX ) 0.25 MG tablet TAKE 1 TABLET EVERY DAY AS NEEDED FOR ANXIETY 30 tablet 2   amiodarone  (PACERONE ) 200 MG tablet TAKE 1/2 TABLET EVERY MORNING 45 tablet 3   amLODipine  (NORVASC ) 2.5 MG tablet Take 1 tablet (2.5 mg total) by mouth daily. 90 tablet 3   atorvastatin  (LIPITOR) 40 MG tablet Take 1 tablet (40 mg total) by mouth daily. *NEED OFFICE VISIT* TAKE 1 TABLET EVERY DAY 90 tablet 3   b complex vitamins capsule Take 1 capsule by mouth daily. 30 capsule 5   Cholecalciferol  1.25 MG (50000 UT) TABS 50,000 units PO qwk for 8 weeks. 8 tablet 0   Cyanocobalamin  (B-12) 1000 MCG SUBL Place 1,000 mcg under the tongue daily. 30 tablet 11   ELIQUIS  5 MG TABS tablet TAKE 1 TABLET BY MOUTH 2 TIMES A DAY 180 tablet 1   fluticasone  furoate-vilanterol (BREO ELLIPTA ) 100-25 MCG/ACT AEPB Inhale 1 puff into the lungs daily. 90 each 3   gabapentin  (NEURONTIN ) 100 MG capsule TAKE 2 CAPSULES BY MOUTH 3 TIMES A DAY 270 capsule 1   metoprolol  succinate (TOPROL -XL) 50 MG 24 hr tablet Take 0.5 tablets (25 mg total) by mouth daily. 90 tablet 3   nitroGLYCERIN  (NITROSTAT ) 0.4 MG SL tablet Place 1 tablet (0.4 mg total) under the tongue every 5 (five) minutes x 3 doses as needed for chest pain. 25 tablet 0   pantoprazole  (PROTONIX ) 40 MG tablet Take 1 tablet (40 mg total) by mouth daily before breakfast. 30 tablet 1   PARoxetine  (PAXIL ) 10 MG tablet Take 1 tablet (10 mg total) by mouth daily. 90 tablet 3   No current facility-administered medications for this visit.    Allergies:   Hydrocodone and Oxycodone    Social History:  The  patient  reports that she has been smoking cigarettes. She has a 30 pack-year smoking history. She has never used smokeless tobacco. She reports current alcohol use. She reports that she does not use drugs.   Family History:   family history includes Heart disease in her father and another family member; Hypertension in her mother; Stroke in her mother.   Review of Systems: Review of Systems  Constitutional: Negative.   HENT: Negative.    Respiratory: Negative.    Cardiovascular: Negative.   Gastrointestinal: Negative.   Musculoskeletal:  Positive for back pain and joint pain.  Neurological: Negative.   Psychiatric/Behavioral: Negative.    All other systems reviewed and are negative.  PHYSICAL EXAM: VS:  BP (!) 120/42 (BP Location: Left Arm, Patient Position: Sitting, Cuff Size: Normal)   Pulse (!) 53   Ht 5' 9 (1.753 m)   Wt 181 lb 6 oz (82.3 kg)   SpO2 97%   BMI 26.78 kg/m  , BMI Body mass index is 26.78 kg/m. Constitutional:  oriented to person, place,  and time. No distress.  HENT:  Head: Grossly normal Eyes:  no discharge. No scleral icterus.  Neck: No JVD, no carotid bruits  Cardiovascular: Regular rate and rhythm, no murmurs appreciated Pulmonary/Chest: Clear to auscultation bilaterally, no wheezes or rales Abdominal: Soft.  no distension.  no tenderness.  Musculoskeletal: Normal range of motion Neurological:  normal muscle tone. Coordination normal. No atrophy Skin: Skin warm and dry Psychiatric: normal affect, pleasant  Recent Labs: 04/09/2023: Magnesium 1.9 08/26/2023: ALT 12; BUN 11; Creatinine 0.77; Hemoglobin 11.9; Platelet Count 253; Potassium 4.1; Sodium 143; TSH 2.340    Lipid Panel Lab Results  Component Value Date   CHOL 94 04/04/2023   HDL 31.90 (L) 04/04/2023   LDLCALC 45 04/04/2023   TRIG 84.0 04/04/2023    Wt Readings from Last 3 Encounters:  10/29/23 181 lb 6 oz (82.3 kg)  10/10/23 183 lb 6.4 oz (83.2 kg)  09/30/23 185 lb (83.9 kg)      ASSESSMENT AND PLAN:  Problem List Items Addressed This Visit       Cardiology Problems   Atherosclerosis of aorta (HCC)   Relevant Orders   EKG 12-Lead (Completed)   HLD (hyperlipidemia)   Pulmonary artery hypertension (HCC)   Other Visit Diagnoses       Paroxysmal atrial fibrillation (HCC)       Relevant Orders   EKG 12-Lead (Completed)     Coronary artery disease of native artery of native heart with stable angina pectoris (HCC)       Relevant Orders   EKG 12-Lead (Completed)     Smoker         Essential hypertension       Relevant Orders   EKG 12-Lead (Completed)      CAD with stable angina Currently with no symptoms of angina. No further workup at this time. Continue current medication regimen.  Hyperlipidemia Cholesterol is at goal on the current lipid regimen. No changes to the medications were made.  HTN Blood pressure is well controlled on today's visit. No changes made to the medications.  Atrial fib maintaining NSR, Continue low-dose amiodarone , metoprolol  and Eliquis   Sweating/Flushing Reports symptoms improved     Signed, Velinda Lunger, M.D., Ph.D. [facility] [place] [employer], [place] [phone]

## 2023-10-29 ENCOUNTER — Encounter: Payer: Self-pay | Admitting: Cardiovascular Disease

## 2023-10-29 ENCOUNTER — Other Ambulatory Visit: Payer: Self-pay

## 2023-10-29 ENCOUNTER — Ambulatory Visit: Attending: Cardiovascular Disease | Admitting: Cardiovascular Disease

## 2023-10-29 DIAGNOSIS — E782 Mixed hyperlipidemia: Secondary | ICD-10-CM | POA: Diagnosis not present

## 2023-10-29 DIAGNOSIS — I700 Atherosclerosis of aorta: Secondary | ICD-10-CM

## 2023-10-29 DIAGNOSIS — I480 Paroxysmal atrial fibrillation: Secondary | ICD-10-CM

## 2023-10-29 DIAGNOSIS — I10 Essential (primary) hypertension: Secondary | ICD-10-CM | POA: Diagnosis not present

## 2023-10-29 DIAGNOSIS — I2721 Secondary pulmonary arterial hypertension: Secondary | ICD-10-CM

## 2023-10-29 DIAGNOSIS — I25118 Atherosclerotic heart disease of native coronary artery with other forms of angina pectoris: Secondary | ICD-10-CM

## 2023-10-29 DIAGNOSIS — D500 Iron deficiency anemia secondary to blood loss (chronic): Secondary | ICD-10-CM

## 2023-10-29 DIAGNOSIS — F17200 Nicotine dependence, unspecified, uncomplicated: Secondary | ICD-10-CM | POA: Diagnosis not present

## 2023-10-29 MED ORDER — APIXABAN 5 MG PO TABS: 5.0000 mg | ORAL_TABLET | Freq: Two times a day (BID) | ORAL | 1 refills | Status: AC

## 2023-10-29 MED ORDER — AMIODARONE HCL 200 MG PO TABS: 100.0000 mg | ORAL_TABLET | Freq: Every day | ORAL | 3 refills | Status: AC

## 2023-10-29 NOTE — Patient Instructions (Signed)

## 2023-10-30 ENCOUNTER — Inpatient Hospital Stay (HOSPITAL_BASED_OUTPATIENT_CLINIC_OR_DEPARTMENT_OTHER)

## 2023-10-30 ENCOUNTER — Ambulatory Visit: Payer: Self-pay | Admitting: Family

## 2023-10-30 ENCOUNTER — Inpatient Hospital Stay: Attending: Hematology | Admitting: Hematology

## 2023-10-30 VITALS — BP 131/64 | HR 54 | Temp 97.0°F | Resp 20 | Wt 183.1 lb

## 2023-10-30 DIAGNOSIS — C189 Malignant neoplasm of colon, unspecified: Secondary | ICD-10-CM

## 2023-10-30 DIAGNOSIS — Z8572 Personal history of non-Hodgkin lymphomas: Secondary | ICD-10-CM | POA: Diagnosis not present

## 2023-10-30 DIAGNOSIS — Z8616 Personal history of COVID-19: Secondary | ICD-10-CM | POA: Insufficient documentation

## 2023-10-30 DIAGNOSIS — C186 Malignant neoplasm of descending colon: Secondary | ICD-10-CM | POA: Diagnosis not present

## 2023-10-30 DIAGNOSIS — D500 Iron deficiency anemia secondary to blood loss (chronic): Secondary | ICD-10-CM | POA: Diagnosis not present

## 2023-10-30 DIAGNOSIS — C8298 Follicular lymphoma, unspecified, lymph nodes of multiple sites: Secondary | ICD-10-CM | POA: Diagnosis not present

## 2023-10-30 DIAGNOSIS — E7800 Pure hypercholesterolemia, unspecified: Secondary | ICD-10-CM | POA: Insufficient documentation

## 2023-10-30 DIAGNOSIS — F17210 Nicotine dependence, cigarettes, uncomplicated: Secondary | ICD-10-CM | POA: Diagnosis not present

## 2023-10-30 LAB — CBC WITH DIFFERENTIAL (CANCER CENTER ONLY)
Abs Immature Granulocytes: 0.02 K/uL (ref 0.00–0.07)
Basophils Absolute: 0.1 K/uL (ref 0.0–0.1)
Basophils Relative: 1 %
Eosinophils Absolute: 0.2 K/uL (ref 0.0–0.5)
Eosinophils Relative: 2 %
HCT: 39 % (ref 36.0–46.0)
Hemoglobin: 12.7 g/dL (ref 12.0–15.0)
Immature Granulocytes: 0 %
Lymphocytes Relative: 27 %
Lymphs Abs: 2.8 K/uL (ref 0.7–4.0)
MCH: 29.9 pg (ref 26.0–34.0)
MCHC: 32.6 g/dL (ref 30.0–36.0)
MCV: 91.8 fL (ref 80.0–100.0)
Monocytes Absolute: 0.9 K/uL (ref 0.1–1.0)
Monocytes Relative: 9 %
Neutro Abs: 6.2 K/uL (ref 1.7–7.7)
Neutrophils Relative %: 61 %
Platelet Count: 263 K/uL (ref 150–400)
RBC: 4.25 MIL/uL (ref 3.87–5.11)
RDW: 14.6 % (ref 11.5–15.5)
WBC Count: 10.2 K/uL (ref 4.0–10.5)
nRBC: 0 % (ref 0.0–0.2)

## 2023-10-30 LAB — CMP (CANCER CENTER ONLY)
ALT: 13 U/L (ref 0–44)
AST: 18 U/L (ref 15–41)
Albumin: 3.8 g/dL (ref 3.5–5.0)
Alkaline Phosphatase: 67 U/L (ref 38–126)
Anion gap: 5 (ref 5–15)
BUN: 13 mg/dL (ref 8–23)
CO2: 32 mmol/L (ref 22–32)
Calcium: 8.7 mg/dL — ABNORMAL LOW (ref 8.9–10.3)
Chloride: 105 mmol/L (ref 98–111)
Creatinine: 0.77 mg/dL (ref 0.44–1.00)
GFR, Estimated: >60 mL/min (ref >60)
Glucose, Bld: 82 mg/dL (ref 70–99)
Potassium: 4.2 mmol/L (ref 3.5–5.1)
Sodium: 142 mmol/L (ref 135–145)
Total Bilirubin: 0.4 mg/dL (ref 0.0–1.2)
Total Protein: 6.8 g/dL (ref 6.5–8.1)

## 2023-10-30 LAB — IRON AND IRON BINDING CAPACITY (CC-WL,HP ONLY)
Iron: 55 ug/dL (ref 28–170)
Saturation Ratios: 18 % (ref 10.4–31.8)
TIBC: 300 ug/dL (ref 250–450)
UIBC: 245 ug/dL (ref 148–442)

## 2023-10-30 LAB — FERRITIN: Ferritin: 166 ng/mL (ref 11–307)

## 2023-10-30 LAB — TSH: TSH: 2.05 u[IU]/mL (ref 0.350–4.500)

## 2023-10-30 LAB — LACTATE DEHYDROGENASE: LDH: 155 U/L (ref 98–192)

## 2023-10-31 ENCOUNTER — Ambulatory Visit (HOSPITAL_COMMUNITY)

## 2023-11-06 ENCOUNTER — Other Ambulatory Visit: Payer: Self-pay

## 2023-11-06 DIAGNOSIS — C189 Malignant neoplasm of colon, unspecified: Secondary | ICD-10-CM

## 2023-11-06 DIAGNOSIS — C8298 Follicular lymphoma, unspecified, lymph nodes of multiple sites: Secondary | ICD-10-CM

## 2023-11-06 NOTE — Progress Notes (Signed)
 HEMATOLOGY/ONCOLOGY CLINIC NOTE  Date of Service:.10/30/2023  Patient Care Team: Dineen Rollene MATSU, FNP as PCP - General (Family Medicine) Perla Evalene PARAS, MD as PCP - Cardiology (Cardiology)  CHIEF COMPLAINTS/PURPOSE OF CONSULTATION:  Newly diagnosed colon cancer  HISTORY OF PRESENTING ILLNESS:  See previous notes for details on initial presentation   INTERVAL HISTORY:  Samantha [name] is here with her son for evaluation and management of newly diagnosed colon cancer. Patient was having iron  deficiency anemia and was recommended to get a GI workup. She had a colonoscopy and endoscopy in 09/20/2023.  With Dr. Unk. Colonoscopy showed Two medium- sized angioectasias without bleeding were found in the cecum. Coagulation for hemostasis using argon plasma was successful. An infiltrative, submucosal and ulcerated non- obstructing large mass was found in the descending colon and at 60 cm proximal to the anus. The mass was partially circumferential ( involving one- half of the lumen circumference) . No bleeding was present. Biopsies were taken with a cold forceps for histology. Biopsy of the colonic mass showed well to moderately differentiated adenocarcinoma of the colon in the descending colon. Patient currently denies any overt GI bleeding.  No abdominal pain or distention. She continues to smoke at least a pack a day. Labs on today were reviewed and detailed with her.   MEDICAL HISTORY:  Past Medical History:  Diagnosis Date   A-fib [facility])    Anxiety    Arthritis    Chronic back pain    COPD (chronic obstructive pulmonary disease) (HCC)    Coronary artery disease    COVID-19    08/22/20 likely contracted late 07/2020   Depression    Glaucoma    Hearing aid worn    High cholesterol    Hypertension    Lymphadenopathy 01/25/2016   Myocardial infarction (HCC)    2000   Non Hodgkin's lymphoma (HCC)    Wears dentures     Top plate   Wears glasses     SURGICAL  HISTORY: Past Surgical History:  Procedure Laterality Date   CARDIAC CATHETERIZATION     CATARACT EXTRACTION W/ INTRAOCULAR LENS IMPLANT     CHOLECYSTECTOMY     COLONOSCOPY N/A 10/10/2023   Procedure: COLONOSCOPY;  Surgeon: Unk Corinn Skiff, MD;  Location: [facility] ENDOSCOPY;  Service: Gastroenterology;  Laterality: N/A;   COLONOSCOPY W/ BIOPSIES AND POLYPECTOMY     CORONARY STENT PLACEMENT     ESOPHAGOGASTRODUODENOSCOPY N/A 10/10/2023   Procedure: EGD (ESOPHAGOGASTRODUODENOSCOPY);  Surgeon: Unk Corinn Skiff, MD;  Location: [facility] ENDOSCOPY;  Service: Gastroenterology;  Laterality: N/A;   INGUINAL LYMPH NODE BIOPSY Right 01/25/2016   Procedure: EXCISION DEEP RIGHT INGUINAL LYMPH NODE BIOPSY WITH ULTRA SOUND;  Surgeon: Elon Pacini, MD;  Location: [facility] OR;  Service: General;  Laterality: Right;   POLYPECTOMY  10/10/2023   Procedure: POLYPECTOMY, INTESTINE;  Surgeon: Unk Corinn Skiff, MD;  Location: [facility] ENDOSCOPY;  Service: Gastroenterology;;   SPINE SURGERY      SOCIAL HISTORY: Social History   Socioeconomic History   Marital status: Single    Spouse name: Not on file   Number of children: Not on file   Years of education: Not on file   Highest education level: GED or equivalent  Occupational History   Not on file  Tobacco Use   Smoking status: Every Day    Current packs/day: 0.50    Average packs/day: 0.5 packs/day for 60.0 years (30.0 ttl pk-yrs)    Types: Cigarettes   Smokeless tobacco: Never  Tobacco comments:    3-4 cigs daily--04/23/2023  Vaping Use   Vaping status: Never Used  Substance and Sexual Activity   Alcohol use: Yes    Comment: social   Drug use: No   Sexual activity: Not Currently  Other Topics Concern   Not on file  Social History Narrative   Not on file   Social Drivers of Health   Financial Resource Strain: Low Risk  (09/30/2023)   Overall Financial Resource Strain (CARDIA)    Difficulty of Paying Living Expenses: Not hard at all  Food  Insecurity: No Food Insecurity (09/30/2023)   Hunger Vital Sign    Worried About Running Out of Food in the Last Year: Never true    Ran Out of Food in the Last Year: Never true  Transportation Needs: No Transportation Needs (09/30/2023)   PRAPARE - Administrator, Civil Service (Medical): No    Lack of Transportation (Non-Medical): No  Physical Activity: Inactive (09/30/2023)   Exercise Vital Sign    Days of Exercise per Week: 0 days    Minutes of Exercise per Session: 0 min  Stress: No Stress Concern Present (09/30/2023)   [employer] of Occupational Health - Occupational Stress Questionnaire    Feeling of Stress: Not at all  Social Connections: Socially Isolated (09/30/2023)   Social Connection and Isolation Panel    Frequency of Communication with Friends and Family: More than three times a week    Frequency of Social Gatherings with Friends and Family: More than three times a week    Attends Religious Services: Never    Database administrator or Organizations: No    Attends Banker Meetings: Never    Marital Status: Widowed  Intimate Partner Violence: Not At Risk (09/30/2023)   Humiliation, Afraid, Rape, and Kick questionnaire    Fear of Current or Ex-Partner: No    Emotionally Abused: No    Physically Abused: No    Sexually Abused: No    FAMILY HISTORY: Family History  Problem Relation Age of Onset   Hypertension Mother    Stroke Mother    Heart disease Father    Heart disease Other     ALLERGIES:  is allergic to hydrocodone and oxycodone .  MEDICATIONS:  Current Outpatient Medications  Medication Sig Dispense Refill   albuterol  (VENTOLIN  HFA) 108 (90 Base) MCG/ACT inhaler Inhale 2 puffs into the lungs every 6 (six) hours as needed for wheezing or shortness of breath. 8 g 2   ALPRAZolam  (XANAX ) 0.25 MG tablet TAKE 1 TABLET EVERY DAY AS NEEDED FOR ANXIETY 30 tablet 2   amiodarone  (PACERONE ) 200 MG tablet Take 0.5 tablets (100 mg total) by  mouth daily. TAKE 1/2 TABLET EVERY MORNING 45 tablet 3   amLODipine  (NORVASC ) 2.5 MG tablet Take 1 tablet (2.5 mg total) by mouth daily. 90 tablet 3   apixaban  (ELIQUIS ) 5 MG TABS tablet Take 1 tablet (5 mg total) by mouth 2 (two) times daily. 180 tablet 1   atorvastatin  (LIPITOR) 40 MG tablet Take 1 tablet (40 mg total) by mouth daily. *NEED OFFICE VISIT* TAKE 1 TABLET EVERY DAY 90 tablet 3   b complex vitamins capsule Take 1 capsule by mouth daily. 30 capsule 5   Cholecalciferol  1.25 MG (50000 UT) TABS 50,000 units PO qwk for 8 weeks. 8 tablet 0   Cyanocobalamin  (B-12) 1000 MCG SUBL Place 1,000 mcg under the tongue daily. 30 tablet 11   fluticasone  furoate-vilanterol (BREO ELLIPTA ) 100-25  MCG/ACT AEPB Inhale 1 puff into the lungs daily. 90 each 3   gabapentin  (NEURONTIN ) 100 MG capsule TAKE 2 CAPSULES BY MOUTH 3 TIMES A DAY 270 capsule 1   metoprolol  succinate (TOPROL -XL) 50 MG 24 hr tablet Take 0.5 tablets (25 mg total) by mouth daily. 90 tablet 3   nitroGLYCERIN  (NITROSTAT ) 0.4 MG SL tablet Place 1 tablet (0.4 mg total) under the tongue every 5 (five) minutes x 3 doses as needed for chest pain. 25 tablet 0   pantoprazole  (PROTONIX ) 40 MG tablet Take 1 tablet (40 mg total) by mouth daily before breakfast. 30 tablet 1   PARoxetine  (PAXIL ) 10 MG tablet Take 1 tablet (10 mg total) by mouth daily. 90 tablet 3   No current facility-administered medications for this visit.    REVIEW OF SYSTEMS:    .10 Point review of Systems was done is negative except as noted above.  PHYSICAL EXAMINATION: ECOG PERFORMANCE STATUS: 2 - Symptomatic, <50% confined to bed  . Vitals:   10/30/23 1434  BP: 131/64  Pulse: (!) 54  Resp: 20  Temp: (!) 97 F (36.1 C)  SpO2: 94%    Filed Weights   10/30/23 1434  Weight: 183 lb 1.6 oz (83.1 kg)    .Body mass index is 27.04 kg/m.  GENERAL:alert, in no acute distress and comfortable SKIN: no acute rashes, no significant lesions EYES: conjunctiva are pink  and non-injected, sclera anicteric OROPHARYNX: MMM, no exudates, no oropharyngeal erythema or ulceration NECK: supple, no JVD LYMPH:  no palpable lymphadenopathy in the cervical, axillary or inguinal regions LUNGS: clear to auscultation b/l with normal respiratory effort HEART: regular rate & rhythm ABDOMEN:  normoactive bowel sounds , non tender, not distended. Extremity: no pedal edema PSYCH: alert & oriented x 3 with fluent speech NEURO: no focal motor/sensory deficits   LABORATORY DATA:  I have reviewed the data as listed  .    Latest Ref Rng & Units 10/30/2023    2:05 PM 08/26/2023   11:29 AM 05/27/2023   10:15 AM  CBC  WBC 4.0 - 10.5 K/uL 10.2  9.7  9.3   Hemoglobin 12.0 - 15.0 g/dL 87.2  88.0  9.2   Hematocrit 36.0 - 46.0 % 39.0  37.1  31.1   Platelets 150 - 400 K/uL 263  253  303     .    Latest Ref Rng & Units 10/30/2023    2:05 PM 08/26/2023   11:29 AM 05/27/2023   10:15 AM  CMP  Glucose 70 - 99 mg/dL 82  895  877   BUN 8 - 23 mg/dL 13  11  15    Creatinine 0.44 - 1.00 mg/dL 9.22  9.22  9.04   Sodium 135 - 145 mmol/L 142  143  142   Potassium 3.5 - 5.1 mmol/L 4.2  4.1  4.0   Chloride 98 - 111 mmol/L 105  105  106   CO2 22 - 32 mmol/L 32  33  30   Calcium  8.9 - 10.3 mg/dL 8.7  9.3  8.5   Total Protein 6.5 - 8.1 g/dL 6.8  6.8  6.4   Total Bilirubin 0.0 - 1.2 mg/dL 0.4  0.4  0.3   Alkaline Phos 38 - 126 U/L 67  63  60   AST 15 - 41 U/L 18  18  16    ALT 0 - 44 U/L 13  12  8     . Lab Results  Component Value Date  IRON  55 10/30/2023   TIBC 300 10/30/2023   IRONPCTSAT 18 10/30/2023   (Iron  and TIBC)  Lab Results  Component Value Date   FERRITIN 166 10/30/2023     RADIOGRAPHIC STUDIES: I have personally reviewed the radiological images as listed and agreed with the findings in the report. No results found.  ASSESSMENT & PLAN:   83 y.o. Caucasian female with multiple chronic medical comorbidities as noted above with    #1 h/o Stage IVB Low grade  Follicular Lymphoma diagnosed in 2018 with extensive retroperitoneal lymphadenopathy and right inguinal lymphadenopathy as well as osseous involvement of the sacrum. LDH level within normal limits.  S/p 5 cycles of BR (Bendamustine  70mg /m2)   PET CT scan after 5 cycles of Bendamustine  and Rituxan  done on 09/06/2016 shows no residual metabolic active disease and no evidence of disease progression.   CT C/A/P on 05/20/17 reveals no radiographic evidence of disease progression.    02/19/18 CT C/A/P revealed Stable mild retroperitoneal adenopathy, compatible with treated lymphoma. No new or progressive adenopathy. No new sites of disease. Normal size spleen. 2. Three-vessel coronary atherosclerosis. 3. Aortic Atherosclerosis. CT chest abdomen pelvis 10/30/2022 showed some retroperitoneal and pelvic lymphadenopathy suggesting a symptomatic recurrence of her low-grade lymphoma.  #2 newly diagnosed descending colon adenocarcinoma well to moderately differentiated.  #3 iron  deficiency related to GI bleeding from her newly diagnosed colon cancer and likely angioectasia which has been lasered.  PLAN: - Her lab results from today 10/30/2023 were discussed with her in detail CBC shows resolution of anemia with a normal hemoglobin of 12.7. CMP is within normal limits Ferritin is adequate at 166 with an iron  saturation of 18% Patient does not have any indication for additional IV iron  at this time. Her EGD and colonoscopy results were discussed with her in detail. We discussed her new diagnosis of well to moderately differentiated colonic adenocarcinoma, staging, typical treatment recommendations and need for additional workup. - We will plan to get a CT chest abdomen pelvis to stage her tumor - We shall refer her to [facility] surgery for surgical consideration by colorectal surgery. - She will need a baseline CEA tumor marker level with next labs - Further treatment based on her staging - Patient and  her son had several questions which were answered in details. - We had a goals of care discussion and patient feels certainly open to having surgery. - We discussed and recommended smoking cessation to optimize outcomes from surgery and to reduce the risk of blood clots and other morbidities related to smoking. - We discussed that she will likely need a clearance from her PCP for surgery   FOLLOW-UP: -CT chest abdomen pelvis as soon as possible - Referral to [facility] surgery Dr. Bernarda Ned for surgical consideration =phone visit with dr Onesimo after CT CAP  The total time spent in the appointment was 40 minutes*.  All of the patient's questions were answered with apparent satisfaction. The patient knows to call the clinic with any problems, questions or concerns.   Emaline Onesimo MD MS AAHIVMS [facility] Hematology/Oncology Physician [facility]  .*Total Encounter Time as defined by the Centers for Medicare and Medicaid Services includes, in addition to the face-to-face time of a patient visit (documented in the note above) non-face-to-face time: obtaining and reviewing outside history, ordering and reviewing medications, tests or procedures, care coordination (communications with other health care professionals or caregivers) and documentation in the medical record.

## 2023-11-07 ENCOUNTER — Inpatient Hospital Stay

## 2023-11-07 ENCOUNTER — Ambulatory Visit (HOSPITAL_COMMUNITY)
Admission: RE | Admit: 2023-11-07 | Discharge: 2023-11-07 | Disposition: A | Discharge status: Home / Self Care | Source: Ambulatory Visit | Attending: Hematology | Admitting: Hematology

## 2023-11-07 DIAGNOSIS — C189 Malignant neoplasm of colon, unspecified: Secondary | ICD-10-CM | POA: Diagnosis not present

## 2023-11-07 DIAGNOSIS — Z8616 Personal history of COVID-19: Secondary | ICD-10-CM | POA: Diagnosis not present

## 2023-11-07 DIAGNOSIS — C8590 Non-Hodgkin lymphoma, unspecified, unspecified site: Secondary | ICD-10-CM | POA: Diagnosis not present

## 2023-11-07 DIAGNOSIS — D500 Iron deficiency anemia secondary to blood loss (chronic): Secondary | ICD-10-CM | POA: Diagnosis not present

## 2023-11-07 DIAGNOSIS — C186 Malignant neoplasm of descending colon: Secondary | ICD-10-CM | POA: Diagnosis not present

## 2023-11-07 DIAGNOSIS — C8298 Follicular lymphoma, unspecified, lymph nodes of multiple sites: Secondary | ICD-10-CM

## 2023-11-07 DIAGNOSIS — K5730 Diverticulosis of large intestine without perforation or abscess without bleeding: Secondary | ICD-10-CM | POA: Diagnosis not present

## 2023-11-07 DIAGNOSIS — E7800 Pure hypercholesterolemia, unspecified: Secondary | ICD-10-CM | POA: Diagnosis not present

## 2023-11-07 DIAGNOSIS — Z8572 Personal history of non-Hodgkin lymphomas: Secondary | ICD-10-CM | POA: Diagnosis not present

## 2023-11-07 DIAGNOSIS — F17210 Nicotine dependence, cigarettes, uncomplicated: Secondary | ICD-10-CM | POA: Diagnosis not present

## 2023-11-07 DIAGNOSIS — R590 Localized enlarged lymph nodes: Secondary | ICD-10-CM | POA: Diagnosis not present

## 2023-11-07 LAB — CEA (ACCESS): CEA (CHCC): 13.53 ng/mL — ABNORMAL HIGH (ref 0.00–5.00)

## 2023-11-07 MED ORDER — IOHEXOL 300 MG/ML  SOLN
30.0000 mL | Freq: Once | INTRAMUSCULAR | Status: AC | PRN
  Administered 2023-11-07: 30 mL via ORAL

## 2023-11-07 MED ORDER — IOHEXOL 300 MG/ML  SOLN
100.0000 mL | Freq: Once | INTRAMUSCULAR | Status: AC | PRN
  Administered 2023-11-07: 100 mL via INTRAVENOUS

## 2023-11-11 ENCOUNTER — Ambulatory Visit: Payer: Self-pay | Admitting: General Surgery

## 2023-11-11 ENCOUNTER — Telehealth: Payer: Self-pay | Admitting: Cardiovascular Disease

## 2023-11-11 DIAGNOSIS — C189 Malignant neoplasm of colon, unspecified: Secondary | ICD-10-CM | POA: Diagnosis not present

## 2023-11-11 NOTE — H&P (Signed)
 REFERRING PHYSICIAN:  Onesimo Emaline Brink, MD  PROVIDER:  BERNARDA WANDA NED, MD  MRN: [id number] DOB: [birth year] DATE OF ENCOUNTER: 11/11/2023  Subjective   Chief Complaint: New Cancer (colon adencarcinoma)     History of Present Illness: Samantha [name] is a 83 y.o. female who is seen today as an office consultation at the request of Dr. Onesimo for evaluation of New Cancer (colon adencarcinoma) .  83 year old female who underwent colonoscopy due to iron  deficiency anemia.  Multiple polyps were removed from the cecum, ascending colon, transverse colon and descending colon.  And infiltrated cysts submucosal ulcerated mass was found in the descending colon approximately 60 cm proximal to the anus.  Biopsies were taken and the area was tattooed.    Biopsy showed adenocarcinoma, well to moderately differentiated. Patient was referred to oncology and subsequently here for surgical evaluation.  Patient has a smoker and smokes at least 1 pack a day.  She has a history of stage IVb low-grade follicular lymphoma which was diagnosed in 2018 she is status post treatment and has had no evidence for disease progression over the last few years.  Medical history significant for COPD, atrial fibrillation history of MI and non-Hodgkin's lymphoma.  Surgical history significant for lap cholecystectomy.   Review of Systems: A complete review of systems was obtained from the patient.  I have reviewed this information and discussed as appropriate with the patient.  See HPI as well for other ROS.    Medical History: Past Medical History:  Diagnosis Date   Allergic state    Cataract cortical, senile    Bilateral eyes   Chronic obstructive asthma, unspecified (CMS/HHS-HCC)    COPD (chronic obstructive pulmonary disease) (CMS/HHS-HCC)    Degenerative arthritis    Glaucoma (increased eye pressure)    History of shingles    History of TIA (transient ischemic attack)    Hyperlipidemia    Hypertension     Myocardial infarction (CMS/HHS-HCC) 2001   Neuralgia, neuritis, and radiculitis, unspecified    Unspecified hypothyroidism     Patient Active Problem List  Diagnosis   Lumbar radiculitis   DDD (degenerative disc disease), lumbar   Lumbar stenosis with neurogenic claudication   Chronic low back pain   Follicular lymphoma of lymph nodes of multiple regions (CMS/HHS-HCC)   HLD (hyperlipidemia)   HTN (hypertension)   Intermittent atrial fibrillation (CMS/HHS-HCC)    Past Surgical History:  Procedure Laterality Date   CHOLECYSTECTOMY  03/19/2000   Cardiac cath  03/19/2008   COLON@[facility]   10/10/2023   TA/RptPRN/RRV   EGD@[facility]   10/10/2023   NML/RptPRN/RRV   COLONOSCOPY  11/25/00; 01/22/11   Hyperplastic polyp   EXTRACTION TEETH     Wisdom teeth     Allergies  Allergen Reactions   Oxycodone  Rash   Hydrocodone Rash    Current Outpatient Medications on File Prior to Visit  Medication Sig Dispense Refill   ALPRAZolam  (XANAX ) 0.25 MG tablet Take 0.25 mg by mouth 2 (two) times daily as needed.     AMIOdarone  (PACERONE ) 200 MG tablet Take 0.5 tablets (100 mg total) by mouth once daily. 45 tablet 3   atorvastatin  (LIPITOR) 40 MG tablet Take 1 tablet (40 mg total) by mouth once daily. 90 tablet 3   bimatoprost (LUMIGAN) 0.01 % ophthalmic solution 1 drop nightly.     ELIQUIS  5 mg tablet Take 5 mg by mouth every 12 (twelve) hours.       gabapentin  (NEURONTIN ) 300 MG capsule Take 1 capsule (300 mg  total) by mouth 3 (three) times daily. 270 capsule 3   melatonin 10 mg Tab Take 1 tablet by mouth 2 (two) times daily.     metoprolol  succinate (TOPROL -XL) 50 MG XL tablet Take 1 tablet (50 mg total) by mouth once daily. 90 tablet 3   nitroGLYcerin  (NITROSTAT ) 0.4 MG SL tablet Place 0.4 mg under the tongue every 5 (five) minutes as needed for Chest pain. May take up to 3 doses.     predniSONE  (DELTASONE ) 5 MG tablet Take as directed - 6 day taper 21 tablet 0   sodium, potassium, and magnesium  (SUPREP) oral solution Take 1 Bottle by mouth as directed One kit contains 2 bottles.  Take both bottles at the times instructed by your provider. 354 mL 0   traMADoL  (ULTRAM ) 50 mg tablet 1/2-1 po bid prn 25 tablet 1   No current facility-administered medications on file prior to visit.    Family History  Problem Relation Age of Onset   Stroke Mother    High blood pressure (Hypertension) Mother    Myocardial Infarction (Heart attack) Father    High blood pressure (Hypertension) Father    No Known Problems Sister    Heart disease Brother    Myocardial Infarction (Heart attack) Brother      Social History   Tobacco Use  Smoking Status Every Day   Current packs/day: 0.50   Average packs/day: 0.5 packs/day for 50.0 years (25.0 ttl pk-yrs)   Types: Cigarettes  Smokeless Tobacco Never     Social History   Socioeconomic History   Marital status: Married  Tobacco Use   Smoking status: Every Day    Current packs/day: 0.50    Average packs/day: 0.5 packs/day for 50.0 years (25.0 ttl pk-yrs)    Types: Cigarettes   Smokeless tobacco: Never  Substance and Sexual Activity   Alcohol use: Never   Drug use: Never   Sexual activity: Defer   Social Drivers of Health   Financial Resource Strain: Low Risk  (09/30/2023)   Received from [facility]   Overall Financial Resource Strain (CARDIA)    How hard is it for you to pay for the very basics like food, housing, medical care, and heating?: Not hard at all  Food Insecurity: No Food Insecurity (09/30/2023)   Received from [facility]   Hunger Vital Sign    Within the past 12 months, you worried that your food would run out before you got the money to buy more.: Never true    Within the past 12 months, the food you bought just didn't last and you didn't have money to get more.: Never true  Transportation Needs: No Transportation Needs (09/30/2023)   Received from [facility] - Transportation    In the past 12 months, has lack  of transportation kept you from medical appointments or from getting medications?: No    In the past 12 months, has lack of transportation kept you from meetings, work, or from getting things needed for daily living?: No  Physical Activity: Inactive (09/30/2023)   Received from [facility]   Exercise Vital Sign    On average, how many days per week do you engage in moderate to strenuous exercise (like a brisk walk)?: 0 days    On average, how many minutes do you engage in exercise at this level?: 0 min  Stress: No Stress Concern Present (09/30/2023)   Received from [facility] -  Occupational Stress Questionnaire    Do you feel stress - tense, restless, nervous, or anxious, or unable to sleep at night because your mind is troubled all the time - these days?: Not at all  Social Connections: Socially Isolated (09/30/2023)   Received from [facility]   Social Connection and Isolation Panel    In a typical week, how many times do you talk on the phone with family, friends, or neighbors?: More than three times a week    How often do you get together with friends or relatives?: More than three times a week    How often do you attend church or religious services?: Never    Do you belong to any clubs or organizations such as church groups, unions, fraternal or athletic groups, or school groups?: No    How often do you attend meetings of the clubs or organizations you belong to?: Never    Are you married, widowed, divorced, separated, never married, or living with a partner?: Widowed  Housing Stability: Unknown (09/26/2023)   Housing Stability Vital Sign    Homeless in the Last Year: No    Objective:    Vitals:   11/11/23 1414  BP: (!) 168/69  Pulse: 67  Temp: 36.3 C (97.3 F)  TempSrc: Temporal  SpO2: 95%  Weight: 83.7 kg (184 lb 9.6 oz)  Height: 175.3 cm (5' 9)  PainSc: 0-No pain     Exam Gen: NAD Abd: soft   Labs, Imaging and Diagnostic  Testing: CEA 13.5  Hgb 12.7  CT scan of the chest abdomen and pelvis shows colonic wall thickening in the mid transverse colon which would be consistent with her malignancy.  She also has some stable lymphadenopathy from her lymphoma and no findings of disease in the liver or lungs.   Assessment and Plan:  Malignant neoplasm of colon, unspecified part of colon (CMS/HHS-HCC)  (primary encounter diagnosis) Plan: polyethylene glycol (MIRALAX ) powder, bisacodyL        (DULCOLAX) 5 mg EC tablet, metroNIDAZOLE        (FLAGYL) 500 MG tablet, neomycin 500 mg tablet,       ondansetron  (ZOFRAN ) 4 MG tablet  83 year old female with a newly diagnosed colon cancer.  This was thought to be in the descending colon on colonoscopy but was felt to be in the transverse colon on CT scan.  We will evaluate intraoperatively and perform the appropriate surgical resection.  We discussed this in detail today.  We discussed the importance of minimizing her smoking is much as possible in the perioperative period to avoid complications.  All questions were answered.  We will discuss risk stratification with her cardiologist as well as coming off of her Eliquis  prior to surgery.  The surgery and anatomy were described to the patient as well as the risks of surgery and the possible complications.  These include: Bleeding, deep abdominal infections and possible wound complications such as hernia and infection, damage to adjacent structures, leak of surgical connections, which can lead to other surgeries and possibly an ostomy, possible need for other procedures, such as abscess drains in radiology, possible prolonged hospital stay, possible diarrhea from removal of part of the colon, possible constipation from narcotics, possible bowel, bladder or sexual dysfunction if having rectal surgery, prolonged fatigue/weakness or appetite loss, possible early recurrence of of disease, possible complications of their medical problems such  as heart disease or arrhythmias or lung problems, death (less than 1%). I believe the patient understands and wishes to  proceed with the surgery.   Bernarda JAYSON Ned, MD Colon and Rectal Surgery [facility]

## 2023-11-11 NOTE — Telephone Encounter (Signed)
   Pre-operative Risk Assessment    Patient Name: Samantha [name]  DOB: 02-Apr-1940 MRN: [id number]   Date of last office visit: 10/29/23 Date of next office visit: n/a   Request for Surgical Clearance    Procedure:  robotic partial colectomy   Date of Surgery:  Clearance TBD                                Surgeon:  Bernarda Ned, MD Surgeon's Group or Practice Name:  [facility] Phone number:  [phone] Fax number:  [phone]   Type of Clearance Requested:   - Pharmacy:  Hold Apixaban  (Eliquis ) instructions   Type of Anesthesia:  Not Indicated   Additional requests/questions:    [name]   11/11/2023, 4:18 PM

## 2023-11-14 ENCOUNTER — Other Ambulatory Visit: Payer: Self-pay

## 2023-11-14 ENCOUNTER — Telehealth: Payer: Self-pay

## 2023-11-14 DIAGNOSIS — W19XXXA Unspecified fall, initial encounter: Secondary | ICD-10-CM

## 2023-11-14 NOTE — Telephone Encounter (Signed)
 Patient with diagnosis of atrial fibrillation on Eliquis  for anticoagulation.    Procedure:  robotic partial colectomy    Date of Surgery:  Clearance TBD    CHA2DS2-VASc Score = 5   This indicates a 7.2% annual risk of stroke. The patient's score is based upon: CHF History: 0 HTN History: 1 Diabetes History: 0 Stroke History: 0 Vascular Disease History: 1 Age Score: 2 Gender Score: 1   CrCl 75 Platelet count 263  Patient has not had an Afib/aflutter ablation within the last 3 months or DCCV within the last 30 days  Per office protocol, patient can hold Eliquis  for 2 days prior to procedure.   Patient will not need bridging with Lovenox  (enoxaparin ) around procedure.  **This guidance is not considered finalized until pre-operative APP has relayed final recommendations.**

## 2023-11-14 NOTE — Telephone Encounter (Signed)
 Spoke to pt Son gave him number to [facility] in [place] he is going to call them referral has been placed as well to [employer] pt preferred

## 2023-11-14 NOTE — Telephone Encounter (Signed)
   Patient Name: Samantha [name]  DOB: 11-27-1940 MRN: [id number]  Primary Cardiologist: Evalene Lunger, MD  Clinical pharmacists have reviewed the patient's past medical history, labs, and current medications as part of preoperative protocol coverage. The following recommendations have been made:  Per office protocol, patient can hold Eliquis  for 2 days prior to procedure.   Patient will not need bridging with Lovenox  (enoxaparin ) around procedure.   I will route this recommendation to the requesting party via Epic fax function and remove from pre-op pool.  Please call with questions.  Wyn Raddle, Jackee Shove, NP 11/14/2023, 8:36 AM

## 2023-11-14 NOTE — Telephone Encounter (Signed)
 noted

## 2023-11-14 NOTE — Telephone Encounter (Signed)
 Copied from CRM [id number]. Topic: Clinical - Medical Advice >> Nov 14, 2023  9:15 AM Samantha [name] wrote: Reason for CRM:  Patients Son: Samantha [name] called in on behalf of the patient to state that she had a Fall yesterday and broke her ankle in 2 places, the hospital transferred the patient to [facility] to have surgery in [place]  - Patients representative stated they do not go to [facility] - nor do they want to go to the [facility] and is requesting if the PCP/PCP(Nurse) team can provide a different location within the [facility] in which the patient can go to have treatment instead.    Please Advise -  Callback Number: [phone]

## 2023-11-15 ENCOUNTER — Other Ambulatory Visit: Payer: Self-pay

## 2023-11-15 ENCOUNTER — Encounter: Payer: Self-pay | Admitting: Physician Assistant

## 2023-11-15 ENCOUNTER — Ambulatory Visit: Admitting: Physician Assistant

## 2023-11-15 DIAGNOSIS — M25571 Pain in right ankle and joints of right foot: Secondary | ICD-10-CM

## 2023-11-15 DIAGNOSIS — S82841A Displaced bimalleolar fracture of right lower leg, initial encounter for closed fracture: Secondary | ICD-10-CM | POA: Diagnosis not present

## 2023-11-15 MED ORDER — TRAMADOL HCL 50 MG PO TABS: 50.0000 mg | ORAL_TABLET | Freq: Four times a day (QID) | ORAL | 0 refills | Status: DC | PRN

## 2023-11-15 NOTE — Progress Notes (Signed)
 Office Visit Note   Patient: Samantha [name]           Date of Birth: 01-Aug-1940           MRN: [id number] Visit Date: 11/15/2023              Requested by: Dineen Rollene MATSU, FNP [address] [place],  [place] [address] PCP: Dineen Rollene MATSU, FNP  Chief Complaint  Patient presents with   Right Ankle - Fracture      HPI: 83 y/o female was pulling in her trash cans at the end of the drive way and feel and twisted her right ankle.  She was seen in the [place] area at an urgent care.  They found that she had a bimalleolar fracture.  She is here for follow up and care plan.  She has pian and edema with ecchymosis.  Past medical history:COPD, atrial fibrillation history of MI and non-Hodgkin's lymphoma.   Recent diagnosis of Colon Cancer with future plans for resection.  She is very independent and is active on a daily basis.     Assessment & Plan: Visit Diagnoses:  1. Pain in right ankle and joints of right foot   2. Closed bimalleolar fracture of right ankle, initial encounter     Plan: Cam boot TDWB right LE.  She has a walker for mobility.  She will hold her Eliquis  starting tomorrow until Surgery.  ORIF right ankle Wednesday 11/20/2023.  We discussed risks and benefits of proceeding with surgery and she agrees to proceed.    Follow-Up Instructions: Return in about 5 days (around 11/20/2023) for Surgery.   Ortho Exam  Patient is alert, oriented, no adenopathy, well-dressed, normal affect, normal respiratory effort. Heart irregular irregular HR known Afib Doppler biphasic PT/peroneal, DP intact.  Positive tenderness to palpation medial and lateral.  Positive ecchymosis and edema mid lower leg to the toes.  Sensation intact.      Imaging: oblique fracture of the fibula at the level of the syndesmosis, along with a fracture of the medial malleolus  Labs: Lab Results  Component Value Date   HGBA1C 6.0 04/04/2023   HGBA1C 5.9 (A) 04/27/2022   HGBA1C 6.3  12/25/2021     Lab Results  Component Value Date   ALBUMIN 3.8 10/30/2023   ALBUMIN 3.8 08/26/2023   ALBUMIN 3.6 05/27/2023    Lab Results  Component Value Date   MG 1.9 04/09/2023   Lab Results  Component Value Date   VD25OH 16.77 (L) 04/04/2023   VD25OH 30.08 12/25/2021   VD25OH 25.10 (L) 08/23/2021    No results found for: PREALBUMIN    Latest Ref Rng & Units 10/30/2023    2:05 PM 08/26/2023   11:29 AM 05/27/2023   10:15 AM  CBC EXTENDED  WBC 4.0 - 10.5 K/uL 10.2  9.7  9.3   RBC 3.87 - 5.11 MIL/uL 4.25  4.15  3.92   Hemoglobin 12.0 - 15.0 g/dL 87.2  88.0  9.2   HCT 63.9 - 46.0 % 39.0  37.1  31.1   Platelets 150 - 400 K/uL 263  253  303   NEUT# 1.7 - 7.7 K/uL 6.2  6.3  5.9   Lymph# 0.7 - 4.0 K/uL 2.8  2.5  2.3      There is no height or weight on file to calculate BMI.  Orders:  Orders Placed This Encounter  Procedures   XR Ankle Complete Right   Meds ordered this  encounter  Medications   traMADol  (ULTRAM ) 50 MG tablet    Sig: Take 1 tablet (50 mg total) by mouth every 6 (six) hours as needed.    Dispense:  30 tablet    Refill:  0    Supervising Provider:   DUDA, MARCUS V [[id number]]     Procedures: No procedures performed  Clinical Data: No additional findings.  ROS:  All other systems negative, except as noted in the HPI. Review of Systems  Objective: Vital Signs: There were no vitals taken for this visit.  Specialty Comments:  No specialty comments available.  PMFS History: Patient Active Problem List   Diagnosis Date Noted   Abdominal pain 07/17/2023   IDA (iron  deficiency anemia) 05/27/2023   Diverticulosis of colon without hemorrhage 04/10/2023   Hematochezia 04/10/2023   Acute on chronic anemia 04/10/2023   Multifocal pneumonia 04/08/2023   GI bleed 04/08/2023   Excessive sweating 10/30/2022   Status post insertion of spinal cord stimulator 08/07/2022   Left foot pain 07/06/2022   Lumbar post-laminectomy syndrome 03/08/2022    Radicular pain of left lower extremity 03/08/2022   Prediabetes 12/25/2021   COPD GOLD 2 / still smoking  12/13/2020   Cigarette smoker 12/13/2020   B12 deficiency 10/19/2020   Tinea pedis 10/19/2020   Mucus plugging of bronchi 09/02/2020   Pulmonary artery hypertension (HCC) 09/02/2020   Coronary artery disease involving native coronary artery of native heart without angina pectoris 09/02/2020   Abnormal CT scan of lung 09/02/2020   Ground glass opacity present on imaging of lung 09/01/2020   Atherosclerosis of aorta (HCC) 08/23/2020   SOB (shortness of breath) 08/22/2020   HLD (hyperlipidemia) 06/22/2020   Hoarseness 07/25/2018   Intermittent atrial fibrillation (HCC) 05/30/2018   Lumbar foraminal stenosis 04/25/2018   HTN (hypertension) 03/26/2018   Anxiety and depression 03/26/2018   Routine physical examination 03/26/2018   Chronic low back pain 03/26/2018   Follicular lymphoma of lymph nodes of multiple regions (HCC) 01/30/2016   DDD (degenerative disc disease), lumbar 05/31/2015   Past Medical History:  Diagnosis Date   A-fib (HCC)    Anxiety    Arthritis    Chronic back pain    COPD (chronic obstructive pulmonary disease) (HCC)    Coronary artery disease    COVID-19    08/22/20 likely contracted late 07/2020   Depression    Glaucoma    Hearing aid worn    High cholesterol    Hypertension    Lymphadenopathy 01/25/2016   Myocardial infarction (HCC)    2000   Non Hodgkin's lymphoma (HCC)    Wears dentures     Top plate   Wears glasses     Family History  Problem Relation Age of Onset   Hypertension Mother    Stroke Mother    Heart disease Father    Heart disease Other     Past Surgical History:  Procedure Laterality Date   CARDIAC CATHETERIZATION     CATARACT EXTRACTION W/ INTRAOCULAR LENS IMPLANT     CHOLECYSTECTOMY     COLONOSCOPY N/A 10/10/2023   Procedure: COLONOSCOPY;  Surgeon: Unk Corinn Skiff, MD;  Location: [facility] ENDOSCOPY;  Service:  Gastroenterology;  Laterality: N/A;   COLONOSCOPY W/ BIOPSIES AND POLYPECTOMY     CORONARY STENT PLACEMENT     ESOPHAGOGASTRODUODENOSCOPY N/A 10/10/2023   Procedure: EGD (ESOPHAGOGASTRODUODENOSCOPY);  Surgeon: Unk Corinn Skiff, MD;  Location: [facility] ENDOSCOPY;  Service: Gastroenterology;  Laterality: N/A;   INGUINAL LYMPH NODE BIOPSY Right  01/25/2016   Procedure: EXCISION DEEP RIGHT INGUINAL LYMPH NODE BIOPSY WITH ULTRA SOUND;  Surgeon: Elon Pacini, MD;  Location: [facility] OR;  Service: General;  Laterality: Right;   POLYPECTOMY  10/10/2023   Procedure: POLYPECTOMY, INTESTINE;  Surgeon: Unk Corinn Skiff, MD;  Location: [facility] ENDOSCOPY;  Service: Gastroenterology;;   SPINE SURGERY     Social History   Occupational History   Not on file  Tobacco Use   Smoking status: Every Day    Current packs/day: 0.50    Average packs/day: 0.5 packs/day for 60.0 years (30.0 ttl pk-yrs)    Types: Cigarettes   Smokeless tobacco: Never   Tobacco comments:    3-4 cigs daily--04/23/2023  Vaping Use   Vaping status: Never Used  Substance and Sexual Activity   Alcohol use: Yes    Comment: social   Drug use: No   Sexual activity: Not Currently

## 2023-11-19 ENCOUNTER — Inpatient Hospital Stay: Attending: Hematology | Admitting: Hematology

## 2023-11-19 ENCOUNTER — Encounter (HOSPITAL_COMMUNITY): Payer: Self-pay | Admitting: Orthopedic Surgery

## 2023-11-19 ENCOUNTER — Other Ambulatory Visit: Payer: Self-pay

## 2023-11-19 DIAGNOSIS — C189 Malignant neoplasm of colon, unspecified: Secondary | ICD-10-CM | POA: Diagnosis not present

## 2023-11-19 DIAGNOSIS — K922 Gastrointestinal hemorrhage, unspecified: Secondary | ICD-10-CM | POA: Diagnosis not present

## 2023-11-19 DIAGNOSIS — D500 Iron deficiency anemia secondary to blood loss (chronic): Secondary | ICD-10-CM

## 2023-11-19 DIAGNOSIS — Z8572 Personal history of non-Hodgkin lymphomas: Secondary | ICD-10-CM | POA: Insufficient documentation

## 2023-11-19 DIAGNOSIS — C8298 Follicular lymphoma, unspecified, lymph nodes of multiple sites: Secondary | ICD-10-CM

## 2023-11-19 DIAGNOSIS — E611 Iron deficiency: Secondary | ICD-10-CM | POA: Insufficient documentation

## 2023-11-19 DIAGNOSIS — F17210 Nicotine dependence, cigarettes, uncomplicated: Secondary | ICD-10-CM | POA: Insufficient documentation

## 2023-11-19 DIAGNOSIS — C186 Malignant neoplasm of descending colon: Secondary | ICD-10-CM | POA: Insufficient documentation

## 2023-11-19 NOTE — Progress Notes (Signed)
 PCP - Rollene Northern, FNP Cardiologist - Dr Evalene Lunger  Chest x-ray - 07/17/23 EKG - 10/29/23 Stress Test - 08/05/13 ECHO - 05/12/15 Cardiac Cath - 01/21/00  ICD Pacemaker/Loop - n/a  Sleep Study -  n/a  Diabetes - n/a  Blood Thinner Instructions:  Follow your surgeon's instructions on when to stop Eliquis  prior to surgery.  Last dose was on 11/12/23.  Aspirin Instructions: n/a  ERAS - clear liquids til 9 AM DOS.  Anesthesia review: Yes  STOP now taking any Aspirin (unless otherwise instructed by your surgeon), Aleve, Naproxen, Ibuprofen , Motrin , Advil , Goody's, BC's, all herbal medications, fish oil, and all vitamins.   Coronavirus Screening Do you have any of the following symptoms:  Cough yes/no: No Fever (>100.25F)  yes/no: No Runny nose yes/no: No Sore throat yes/no: No Difficulty breathing/shortness of breath  yes/no: No  Have you traveled in the last 14 days and where? yes/no: No  Patient verbalized understanding of instructions that were given via phone.

## 2023-11-19 NOTE — H&P (Signed)
 Samantha [name] is an 83 y.o. female.   Chief Complaint: right ankle pain HPI:  83 y/o female was pulling in her trash cans at the end of the drive way and feel and twisted her right ankle.  She was seen in the [place] area at an urgent care.  They found that she had a bimalleolar fracture.  She is here for follow up and care plan.  She has pian and edema with ecchymosis.             Past medical history:COPD, atrial fibrillation history of MI and non-Hodgkin's lymphoma.   Recent diagnosis of Colon Cancer with future plans for resection.  She is very independent and is active on a daily basis.      Past Medical History:  Diagnosis Date   A-fib [facility])    Anxiety    Arthritis    Chronic back pain    COPD (chronic obstructive pulmonary disease) (HCC)    Coronary artery disease    COVID-19    08/22/20 likely contracted late 07/2020   Depression    Glaucoma    Hearing aid worn    High cholesterol    Hypertension    Lymphadenopathy 01/25/2016   Myocardial infarction (HCC)    2000   Non Hodgkin's lymphoma (HCC)    Wears dentures     Top plate   Wears glasses     Past Surgical History:  Procedure Laterality Date   CARDIAC CATHETERIZATION     CATARACT EXTRACTION W/ INTRAOCULAR LENS IMPLANT     CHOLECYSTECTOMY     COLONOSCOPY N/A 10/10/2023   Procedure: COLONOSCOPY;  Surgeon: Unk Corinn Skiff, MD;  Location: [facility] ENDOSCOPY;  Service: Gastroenterology;  Laterality: N/A;   COLONOSCOPY W/ BIOPSIES AND POLYPECTOMY     CORONARY STENT PLACEMENT     ESOPHAGOGASTRODUODENOSCOPY N/A 10/10/2023   Procedure: EGD (ESOPHAGOGASTRODUODENOSCOPY);  Surgeon: Unk Corinn Skiff, MD;  Location: [facility] ENDOSCOPY;  Service: Gastroenterology;  Laterality: N/A;   INGUINAL LYMPH NODE BIOPSY Right 01/25/2016   Procedure: EXCISION DEEP RIGHT INGUINAL LYMPH NODE BIOPSY WITH ULTRA SOUND;  Surgeon: Elon Pacini, MD;  Location: [facility] OR;  Service: General;  Laterality: Right;   POLYPECTOMY  10/10/2023   Procedure:  POLYPECTOMY, INTESTINE;  Surgeon: Unk Corinn Skiff, MD;  Location: [facility] ENDOSCOPY;  Service: Gastroenterology;;   SPINE SURGERY      Family History  Problem Relation Age of Onset   Hypertension Mother    Stroke Mother    Heart disease Father    Heart disease Other    Social History:  reports that she has been smoking cigarettes. She has a 30 pack-year smoking history. She has never used smokeless tobacco. She reports current alcohol use. She reports that she does not use drugs.  Allergies:  Allergies  Allergen Reactions   Hydrocodone Rash    Itching, rash   Oxycodone  Rash    No medications prior to admission.    No results found for this or any previous visit (from the past 48 hours). No results found.  Review of Systems  All other systems reviewed and are negative.  Imaging: oblique fracture of the fibula at the level of the syndesmosis, along with a fracture of the medial malleolus  There were no vitals taken for this visit. Physical Exam   Patient is alert, oriented, no adenopathy, well-dressed, normal affect normal respiratory effort. Heart irregular irregular HR known Afib Doppler biphasic PT/peroneal, DP intact.   Positive tenderness to palpation medial and  lateral.   Positive ecchymosis and edema mid lower leg to the toes.  Sensation intact.      Assessment/Plan Visit Diagnoses:  1. Pain in right ankle and joints of right foot   2. Closed bimalleolar fracture of right ankle, initial encounter       Plan: Cam boot TDWB right LE.  She has a walker for mobility.  She will hold her Eliquis  starting tomorrow until Surgery.  ORIF right ankle Wednesday 11/20/2023.  We discussed risks and benefits of proceeding with surgery and she agrees to proceed.    Maurilio Deland Collet, PA-C 11/19/2023, 12:29 PM

## 2023-11-19 NOTE — Anesthesia Preprocedure Evaluation (Signed)
 Anesthesia Evaluation  Patient identified by MRN, date of birth, ID band Patient awake    Reviewed: Allergy & Precautions, H&P , NPO status , Patient's Chart, lab work & pertinent test results  Airway Mallampati: II  TM Distance: >3 FB Neck ROM: Full    Dental no notable dental hx. (+) Partial Upper   Pulmonary COPD, Current Smoker and Patient abstained from smoking.   Pulmonary exam normal breath sounds clear to auscultation       Cardiovascular hypertension, + CAD and + Past MI  Normal cardiovascular exam+ dysrhythmias Atrial Fibrillation  Rhythm:Regular Rate:Normal     Neuro/Psych  PSYCHIATRIC DISORDERS Anxiety Depression    negative neurological ROS     GI/Hepatic Neg liver ROS,GERD  ,,  Endo/Other  negative endocrine ROS    Renal/GU negative Renal ROS  negative genitourinary   Musculoskeletal  (+) Arthritis ,    Abdominal   Peds negative pediatric ROS (+)  Hematology  (+) Blood dyscrasia, anemia Non hodgkin lymphoma  eliquis    Anesthesia Other Findings   Reproductive/Obstetrics negative OB ROS                              Anesthesia Physical Anesthesia Plan  ASA: 3  Anesthesia Plan: MAC and Regional   Post-op Pain Management: Regional block*   Induction: Intravenous  PONV Risk Score and Plan: 1 and Propofol  infusion and Treatment may vary due to age or medical condition  Airway Management Planned: Natural Airway  Additional Equipment:   Intra-op Plan:   Post-operative Plan:   Informed Consent: I have reviewed the patients History and Physical, chart, labs and discussed the procedure including the risks, benefits and alternatives for the proposed anesthesia with the patient or authorized representative who has indicated his/her understanding and acceptance.     Dental advisory given  Plan Discussed with: CRNA  Anesthesia Plan Comments: (PAT note written 11/19/2023  by Curren Mohrmann, PA-C.  Patient is an 83 year old female scheduled for above procedure. She fell while pulling her trash cans in from the driveway and sustained a bimalleolar right ankle fracture. She is also being considered for robotic partial colectomy by Dr. Bernarda Ned in the future for new colon cancer diagnosis.    History includes smoking, COPD, HTN, CAD (inferior STEMI, s/p mid RCA Bx Velocity stent 01/21/2000), PAF, hypercholesterolemia, GERD, non-Hodgkin's lymphoma (2018), GERD, anemia, colon cancer (descending colon mass biopsy 10/10/2023 + adenocarcinoma), glaucoma, cholecystectomy, spinal surgery (L4-5 PLIF 04/25/2018, [employer] spinal cord stimulator 04/20/2022, thoracic by operative note), hearing aids, dentures.    Last oncology visit was on 11/19/2023 with Dr. Onesimo for newly diagnosed colon cancer and for known stage IVB low grade follicular lymphoma (diagnosed 2018, s/p 5 cycles Bendamustine  & Rituxan ). CT scan on 10/30/2022 showed some retroperitoneal and pelvis lymphadenopathy suggesting asymptomatic recurrence of low grade lymphoma. Follow-up CT in August 2025 showed transverse colon thickening and stable retroperitoneal lymphadenopathy. Labs felt stable. He noted recent evaluation by Dr. Ned for consideration of partial colon resection--although appears surgery is not yet scheduled and with interim fall with ankle fracture and pending ORIF. He plans to see her ~ 2 weeks after colon surgery.    Last cardiology visit with Dr. Gollan was on 10/29/2023. Noted recent colon cancer diagnosis. + smoking 1/2 PPD. Trying to stay active. Some known DOE with longer distances and a fast pace, but recovers quickly with rest. No chest pain. Maintaining SR. He did not  recommend any additional cardiac testing at that time. Continue current medication regimen. 12 month follow-up recommended. Dr. Debby' office did reach out to cardiology for perioperative Eliquis  instructions and were advised she  can hold for 2 days prior to surgery and would NOT need Lovenox  bridging.    She had pulmonology follow-up with Dr. Malka on 08/07/2023 for COPD/asthma overlap and multi-focal PNA in January 2025. She was overall doing well at that visit. Compliant with Breo Ellipta . Rare use of rescue inhaler. Routine follow-up in 6 months recommended.  )         Anesthesia Quick Evaluation

## 2023-11-19 NOTE — Progress Notes (Signed)
 HEMATOLOGY/ONCOLOGY PHONE VISIT NOTE  Date of Service:.11/19/2023  Patient Care Team: Dineen Rollene MATSU, FNP as PCP - General (Family Medicine) Perla Evalene PARAS, MD as PCP - Cardiology (Cardiology)  CHIEF COMPLAINTS/PURPOSE OF CONSULTATION:  Newly diagnosed colon cancer  HISTORY OF PRESENTING ILLNESS:  See previous notes for details on initial presentation   INTERVAL HISTORY:  .SABRAI connected with Samantha [name] on 11/19/2023 at  8:40 AM EDT by telephone visit and verified that I am speaking with the correct person using two identifiers.   I discussed the limitations, risks, security and privacy concerns of performing an evaluation and management service by telemedicine and the availability of in-person appointments. I also discussed with the patient that there may be a patient responsible charge related to this service. The patient expressed understanding and agreed to proceed.   Other persons participating in the visit and their role in the encounter: Patient's son  Patient's location: Home Provider's location: [facility]   Chief Complaint: Follow-up for newly diagnosed colon cancer to discuss CT chest abdomen pelvis.  Patient notes no acute new symptoms since her last clinic visit.  She did unfortunately have a mechanical fall on a gravel driveway and injured her ankle.  She notes she has an ankle fracture and is scheduled for an ORIF tomorrow. She did see Dr. Debby for consideration of colorectal surgery for her newly diagnosed colon cancer. CT chest abdomen pelvis were discussed with her in details. Labs were discussed in detail.  MEDICAL HISTORY:  Past Medical History:  Diagnosis Date   A-fib [facility])    Anxiety    Arthritis    Chronic back pain    COPD (chronic obstructive pulmonary disease) (HCC)    Coronary artery disease    COVID-19    08/22/20 likely contracted late 07/2020   Depression    Glaucoma    Hearing aid worn    High cholesterol    Hypertension     Lymphadenopathy 01/25/2016   Myocardial infarction (HCC)    2000   Non Hodgkin's lymphoma (HCC)    Wears dentures     Top plate   Wears glasses     SURGICAL HISTORY: Past Surgical History:  Procedure Laterality Date   CARDIAC CATHETERIZATION     CATARACT EXTRACTION W/ INTRAOCULAR LENS IMPLANT     CHOLECYSTECTOMY     COLONOSCOPY N/A 10/10/2023   Procedure: COLONOSCOPY;  Surgeon: Unk Corinn Skiff, MD;  Location: [facility] ENDOSCOPY;  Service: Gastroenterology;  Laterality: N/A;   COLONOSCOPY W/ BIOPSIES AND POLYPECTOMY     CORONARY STENT PLACEMENT     ESOPHAGOGASTRODUODENOSCOPY N/A 10/10/2023   Procedure: EGD (ESOPHAGOGASTRODUODENOSCOPY);  Surgeon: Unk Corinn Skiff, MD;  Location: [facility] ENDOSCOPY;  Service: Gastroenterology;  Laterality: N/A;   INGUINAL LYMPH NODE BIOPSY Right 01/25/2016   Procedure: EXCISION DEEP RIGHT INGUINAL LYMPH NODE BIOPSY WITH ULTRA SOUND;  Surgeon: Elon Pacini, MD;  Location: [facility] OR;  Service: General;  Laterality: Right;   POLYPECTOMY  10/10/2023   Procedure: POLYPECTOMY, INTESTINE;  Surgeon: Unk Corinn Skiff, MD;  Location: [facility] ENDOSCOPY;  Service: Gastroenterology;;   SPINE SURGERY      SOCIAL HISTORY: Social History   Socioeconomic History   Marital status: Single    Spouse name: Not on file   Number of children: Not on file   Years of education: Not on file   Highest education level: GED or equivalent  Occupational History   Not on file  Tobacco Use   Smoking status: Every Day  Current packs/day: 0.50    Average packs/day: 0.5 packs/day for 60.0 years (30.0 ttl pk-yrs)    Types: Cigarettes   Smokeless tobacco: Never   Tobacco comments:    3-4 cigs daily--04/23/2023  Vaping Use   Vaping status: Never Used  Substance and Sexual Activity   Alcohol use: Yes    Comment: social   Drug use: No   Sexual activity: Not Currently  Other Topics Concern   Not on file  Social History Narrative   Not on file   Social Drivers of Health    Financial Resource Strain: Low Risk  (09/30/2023)   Overall Financial Resource Strain (CARDIA)    Difficulty of Paying Living Expenses: Not hard at all  Food Insecurity: No Food Insecurity (09/30/2023)   Hunger Vital Sign    Worried About Running Out of Food in the Last Year: Never true    Ran Out of Food in the Last Year: Never true  Transportation Needs: No Transportation Needs (09/30/2023)   PRAPARE - Administrator, Civil Service (Medical): No    Lack of Transportation (Non-Medical): No  Physical Activity: Inactive (09/30/2023)   Exercise Vital Sign    Days of Exercise per Week: 0 days    Minutes of Exercise per Session: 0 min  Stress: No Stress Concern Present (09/30/2023)   [employer] of Occupational Health - Occupational Stress Questionnaire    Feeling of Stress: Not at all  Social Connections: Socially Isolated (09/30/2023)   Social Connection and Isolation Panel    Frequency of Communication with Friends and Family: More than three times a week    Frequency of Social Gatherings with Friends and Family: More than three times a week    Attends Religious Services: Never    Database administrator or Organizations: No    Attends Banker Meetings: Never    Marital Status: Widowed  Intimate Partner Violence: Not At Risk (09/30/2023)   Humiliation, Afraid, Rape, and Kick questionnaire    Fear of Current or Ex-Partner: No    Emotionally Abused: No    Physically Abused: No    Sexually Abused: No    FAMILY HISTORY: Family History  Problem Relation Age of Onset   Hypertension Mother    Stroke Mother    Heart disease Father    Heart disease Other     ALLERGIES:  is allergic to hydrocodone and oxycodone .  MEDICATIONS:  Current Outpatient Medications  Medication Sig Dispense Refill   albuterol  (VENTOLIN  HFA) 108 (90 Base) MCG/ACT inhaler Inhale 2 puffs into the lungs every 6 (six) hours as needed for wheezing or shortness of breath. 8 g 2    ALPRAZolam  (XANAX ) 0.25 MG tablet TAKE 1 TABLET EVERY DAY AS NEEDED FOR ANXIETY 30 tablet 2   amiodarone  (PACERONE ) 200 MG tablet Take 0.5 tablets (100 mg total) by mouth daily. TAKE 1/2 TABLET EVERY MORNING 45 tablet 3   amLODipine  (NORVASC ) 2.5 MG tablet Take 1 tablet (2.5 mg total) by mouth daily. 90 tablet 3   apixaban  (ELIQUIS ) 5 MG TABS tablet Take 1 tablet (5 mg total) by mouth 2 (two) times daily. 180 tablet 1   atorvastatin  (LIPITOR) 40 MG tablet Take 1 tablet (40 mg total) by mouth daily. *NEED OFFICE VISIT* TAKE 1 TABLET EVERY DAY 90 tablet 3   b complex vitamins capsule Take 1 capsule by mouth daily. 30 capsule 5   Cholecalciferol  1.25 MG (50000 UT) TABS 50,000 units PO qwk for 8  weeks. 8 tablet 0   Cyanocobalamin  (B-12) 1000 MCG SUBL Place 1,000 mcg under the tongue daily. 30 tablet 11   fluticasone  furoate-vilanterol (BREO ELLIPTA ) 100-25 MCG/ACT AEPB Inhale 1 puff into the lungs daily. 90 each 3   gabapentin  (NEURONTIN ) 100 MG capsule TAKE 2 CAPSULES BY MOUTH 3 TIMES A DAY 270 capsule 1   metoprolol  succinate (TOPROL -XL) 50 MG 24 hr tablet Take 0.5 tablets (25 mg total) by mouth daily. 90 tablet 3   nitroGLYCERIN  (NITROSTAT ) 0.4 MG SL tablet Place 1 tablet (0.4 mg total) under the tongue every 5 (five) minutes x 3 doses as needed for chest pain. 25 tablet 0   pantoprazole  (PROTONIX ) 40 MG tablet Take 1 tablet (40 mg total) by mouth daily before breakfast. 30 tablet 1   PARoxetine  (PAXIL ) 10 MG tablet Take 1 tablet (10 mg total) by mouth daily. 90 tablet 3   traMADol  (ULTRAM ) 50 MG tablet Take 1 tablet (50 mg total) by mouth every 6 (six) hours as needed. 30 tablet 0   No current facility-administered medications for this visit.    REVIEW OF SYSTEMS:    .10 Point review of Systems was done is negative except as noted above.   PHYSICAL EXAMINATION: Telemedicine visit  LABORATORY DATA:  I have reviewed the data as listed  .    Latest Ref Rng & Units 10/30/2023    2:05 PM  08/26/2023   11:29 AM 05/27/2023   10:15 AM  CBC  WBC 4.0 - 10.5 K/uL 10.2  9.7  9.3   Hemoglobin 12.0 - 15.0 g/dL 87.2  88.0  9.2   Hematocrit 36.0 - 46.0 % 39.0  37.1  31.1   Platelets 150 - 400 K/uL 263  253  303     .    Latest Ref Rng & Units 10/30/2023    2:05 PM 08/26/2023   11:29 AM 05/27/2023   10:15 AM  CMP  Glucose 70 - 99 mg/dL 82  895  877   BUN 8 - 23 mg/dL 13  11  15    Creatinine 0.44 - 1.00 mg/dL 9.22  9.22  9.04   Sodium 135 - 145 mmol/L 142  143  142   Potassium 3.5 - 5.1 mmol/L 4.2  4.1  4.0   Chloride 98 - 111 mmol/L 105  105  106   CO2 22 - 32 mmol/L 32  33  30   Calcium  8.9 - 10.3 mg/dL 8.7  9.3  8.5   Total Protein 6.5 - 8.1 g/dL 6.8  6.8  6.4   Total Bilirubin 0.0 - 1.2 mg/dL 0.4  0.4  0.3   Alkaline Phos 38 - 126 U/L 67  63  60   AST 15 - 41 U/L 18  18  16    ALT 0 - 44 U/L 13  12  8     . Lab Results  Component Value Date   IRON  55 10/30/2023   TIBC 300 10/30/2023   IRONPCTSAT 18 10/30/2023   (Iron  and TIBC)  Lab Results  Component Value Date   FERRITIN 166 10/30/2023   CEA 13.5  RADIOGRAPHIC STUDIES: I have personally reviewed the radiological images as listed and agreed with the findings in the report. CT CHEST ABDOMEN PELVIS W CONTRAST Result Date: 11/07/2023 CLINICAL DATA:  Colon cancer staging. Also history of non-Hodgkin's lymphoma. EXAM: CT CHEST, ABDOMEN, AND PELVIS WITH CONTRAST TECHNIQUE: Multidetector CT imaging of the chest, abdomen and pelvis was performed following the standard protocol  during bolus administration of intravenous contrast. RADIATION DOSE REDUCTION: This exam was performed according to the departmental dose-optimization program which includes automated exposure control, adjustment of the mA and/or kV according to patient size and/or use of iterative reconstruction technique. CONTRAST:  OMNIPAQUE  IOHEXOL  300 MG/ML  SOLN COMPARISON:  Chest CT dated 06/04/2023 and CT chest abdomen pelvis dated 10/30/2022. FINDINGS: CT  CHEST FINDINGS Cardiovascular: There is no cardiomegaly or pericardial effusion. There is coronary vascular calcification. Mild atherosclerotic calcification of the thoracic aorta. No aneurysmal dilatation or dissection. The origins of the great vessels of the aortic arch and the central pulmonary arteries are patent. Mediastinum/Nodes: No hilar or mediastinal adenopathy. The esophagus and the thyroid  gland are grossly unremarkable. No mediastinal fluid collection. Lungs/Pleura: Mild bibasilar subpleural interstitial coarsening likely fibrosis. No focal consolidation, pleural effusion, pneumothorax. The central airways are patent. Musculoskeletal: Osteopenia with degenerative changes of the spine. No acute osseous pathology. Thoracic spine stimulator. CT ABDOMEN PELVIS FINDINGS No intra-abdominal free air or free fluid. Hepatobiliary: Faint enhancing focus in the dome of the liver (54/2) is not characterized but may represent a small flash filling hemangioma. The liver is otherwise unremarkable. Mild biliary dilatation, post cholecystectomy. No retained calcified stone noted in the central CBD. Pancreas: Unremarkable. No pancreatic ductal dilatation or surrounding inflammatory changes. Spleen: Normal in size without focal abnormality. Adrenals/Urinary Tract: The left adrenal gland is unremarkable. There is an 11 mm indeterminate right adrenal nodule, stable since 2024, likely a benign adenoma. There is mild fullness of the renal collecting systems bilaterally similar to prior CT of 2024. There is symmetric enhancement and excretion of contrast by both kidneys. There is infiltration of the fat plane adjacent to the midportion of the ureters secondary to retroperitoneal adenopathy likely causing a degree of stricture or extrinsic compression and the ureters resulting in fullness of the renal collecting systems. The urinary bladder is unremarkable. Stomach/Bowel: Mild sigmoid diverticulosis. There is a 5 cm long  segment wall thickening of the mid transverse colon consistent with malignancy. There is no bowel obstruction the appendix is normal. Vascular/Lymphatic: Advanced aortoiliac atherosclerotic disease. The IVC is unremarkable. No portal venous gas. Retroperitoneal adenopathy with infiltrative encasement of the infrarenal abdominal aorta as seen on the CT of 2024. The left para-aortic tissue measures approximately 2.3 cm in thickness similar to prior CT. Right iliac chain lymph node measures 9 mm short axis. Reproductive: The uterus is grossly unremarkable. No suspicious adnexal masses. Other: None Musculoskeletal: Osteopenia with degenerative changes of the spine. L4-L5 posterior fusion and disc spacer. No acute osseous pathology. IMPRESSION: 1. No acute intrathoracic pathology. 2. A 5 cm long segment of wall thickening of the mid transverse colon consistent with malignancy. No bowel obstruction. 3. Retroperitoneal adenopathy with infiltrative encasement of the infrarenal abdominal aorta as seen on the CT of 2024. 4. Mild fullness of the renal collecting systems bilaterally similar to prior CT of 2024. 5.  Aortic Atherosclerosis (ICD10-I70.0). Electronically Signed   By: Vanetta Chou M.D.   On: 11/07/2023 15:14    ASSESSMENT & PLAN:   83 y.o. Caucasian female with multiple chronic medical comorbidities as noted above with    #1 h/o Stage IVB Low grade Follicular Lymphoma diagnosed in 2018 with extensive retroperitoneal lymphadenopathy and right inguinal lymphadenopathy as well as osseous involvement of the sacrum. LDH level within normal limits.  S/p 5 cycles of BR (Bendamustine  70mg /m2)   PET CT scan after 5 cycles of Bendamustine  and Rituxan  done on 09/06/2016 shows no residual  metabolic active disease and no evidence of disease progression.   CT C/A/P on 05/20/17 reveals no radiographic evidence of disease progression.    02/19/18 CT C/A/P revealed Stable mild retroperitoneal adenopathy, compatible  with treated lymphoma. No new or progressive adenopathy. No new sites of disease. Normal size spleen. 2. Three-vessel coronary atherosclerosis. 3. Aortic Atherosclerosis. CT chest abdomen pelvis 10/30/2022 showed some retroperitoneal and pelvic lymphadenopathy suggesting a symptomatic recurrence of her low-grade lymphoma.  #2 Newly diagnosed descending colon ?  Transverse colon on CT adenocarcinoma well to moderately differentiated.  #3 iron  deficiency related to GI bleeding from her newly diagnosed colon cancer and likely angioectasia which has been lasered.  PLAN: - Patient's labs from 10/30/2023 and 11/07/2023 were discussed with her in details. Her CBC shows normal hemoglobin levels CMP stable Ferritin of 166 with iron  saturation of 19% is optimized for surgery. CEA level is elevated at 13.53.  Will need postoperative level 2 to 4 weeks after surgery. We discussed her CT chest abdomen pelvis does not show any overt distant metastases.  5 cm segment of transverse colon that appears thickened.  No acute intrathoracic pathology.  Stable retroperitoneal lymphadenopathy that was also similar on the CT scan in 2024 suggesting it is likely related to her low-grade follicular lymphoma but will need sampling. - Patient has seen Samantha [name] for consideration of colorectal surgery.  The colonoscopy suggested descending colon cancer  FOLLOW-UP: -CT chest abdomen pelvis as soon as possible - Referral to [facility] surgery Dr. Bernarda [name] for surgical consideration of her transverse colon versus descending colon cancer.  Patient notes that surgical considerations were discussed with her in detail by Dr. Ned and that she is being scheduled for surgery. Per Dr. Lus note the extent of colon cancer resection will be based on the exact site of the primary. Patient will need lymph node sampling with her surgery for accurate pathologic staging. Blood counts and iron  levels have been optimized  for surgery with no indication for additional IV iron  at this time. Patient does not note any overt GI bleeding at this time. She will need clearance from her cardiologist and a plan for her anticoagulation from a cardiology standpoint for perioperative management. She recently had a fall with ankle fracture which is being addressed tomorrow by orthopedics. We shall see her back about 2 weeks after her colon cancer surgery to determine need for further adjuvant therapies. The patient and her son's questions were answered in details. We again reiterated the importance of smoking cessation to improve outcomes of surgery and reducing the risk of cancer progression and blood clots as well as cardiac complications and any complications.  Follow-up Return to clinic with Dr. Onesimo with labs in 2 months  The total time spent in the appointment was 23 minutes*.  All of the patient's questions were answered with apparent satisfaction. The patient knows to call the clinic with any problems, questions or concerns.   Samantha Onesimo MD MS AAHIVMS [facility] Hematology/Oncology Physician [facility]  .*Total Encounter Time as defined by the Centers for Medicare and Medicaid Services includes, in addition to the face-to-face time of a patient visit (documented in the note above) non-face-to-face time: obtaining and reviewing outside history, ordering and reviewing medications, tests or procedures, care coordination (communications with other health care professionals or caregivers) and documentation in the medical record.

## 2023-11-19 NOTE — Progress Notes (Signed)
 Anesthesia Chart Review: SAME DAY WORK-UP  Case: [id number] Date/Time: 11/20/23 1145   Procedure: OPEN REDUCTION INTERNAL FIXATION (ORIF) ANKLE FRACTURE (Right: Ankle)   Anesthesia type: Choice   Diagnosis: Bimalleolar ankle fracture, right, closed, initial encounter [D17.158J]   Pre-op diagnosis: Right Bimalleolar Ankle Fracture   [facility] ROOM 04 / [facility]   Surgeons: Harden Jerona GAILS, MD       DISCUSSION: Patient is an 83 year old female scheduled for above procedure. She fell while pulling her trash cans in from the driveway and sustained a bimalleolar right ankle fracture. She is also being considered for robotic partial colectomy by Dr. Bernarda Ned in the future for new colon cancer diagnosis.   History includes smoking, COPD, HTN, CAD (inferior STEMI, s/p mid RCA Bx Velocity stent 01/21/2000), PAF, hypercholesterolemia, GERD, non-Hodgkin's lymphoma (2018), GERD, anemia, colon cancer (descending colon mass biopsy 10/10/2023 + adenocarcinoma), glaucoma, cholecystectomy, spinal surgery (L4-5 PLIF 04/25/2018, [employer] spinal cord stimulator 04/20/2022, thoracic by operative note), hearing aids, dentures.   Last oncology visit was on 11/19/2023 with Dr. Onesimo for newly diagnosed colon cancer and for known stage IVB low grade follicular lymphoma (diagnosed 2018, s/p 5 cycles Bendamustine  & Rituxan ). CT scan on 10/30/2022 showed some retroperitoneal and pelvis lymphadenopathy suggesting asymptomatic recurrence of low grade lymphoma. Follow-up CT in August 2025 showed transverse colon thickening and stable retroperitoneal lymphadenopathy. Labs felt stable. He noted recent evaluation by Dr. Ned for consideration of partial colon resection--although appears surgery is not yet scheduled and with interim fall with ankle fracture and pending ORIF. He plans to see her ~ 2 weeks after colon surgery.   Last cardiology visit with Dr. Gollan was on 10/29/2023. Noted recent colon cancer diagnosis. + smoking  1/2 PPD. Trying to stay active. Some known DOE with longer distances and a fast pace, but recovers quickly with rest. No chest pain. Maintaining SR. He did not recommend any additional cardiac testing at that time. Continue current medication regimen. 12 month follow-up recommended. Dr. Ned' office did reach out to cardiology for perioperative Eliquis  instructions and were advised she can hold for 2 days prior to surgery and would NOT need Lovenox  bridging.   She had pulmonology follow-up with Dr. Malka on 08/07/2023 for COPD/asthma overlap and multi-focal PNA in January 2025. She was overall doing well at that visit. Compliant with Breo Ellipta . Rare use of rescue inhaler. Routine follow-up in 6 months recommended.   Anesthesia team to evaluate on the day of surgery. Last Eliquis  reported as 11/12/2023.    VS: Ht 5' 9 (1.753 m)   Wt 83.9 kg   BMI 27.32 kg/m   BP Readings from Last 3 Encounters:  10/30/23 131/64  10/29/23 (!) 120/42  10/10/23 (!) 171/49   Pulse Readings from Last 3 Encounters:  10/30/23 (!) 54  10/29/23 (!) 53  10/10/23 (!) 55    PROVIDERS: Dineen Rollene MATSU, FNP is PCP Onesimo Karst, MD is HEM-ONC Perla Lye, MD is cardiologist Ned Bernarda, MD is general surgeon Unk Cotton, MD is GI Assaker, Darrin, MD is pulmonologist   LABS: Most recent lab results in [facility] include: Lab Results  Component Value Date   WBC 10.2 10/30/2023   HGB 12.7 10/30/2023   HCT 39.0 10/30/2023   PLT 263 10/30/2023   GLUCOSE 82 10/30/2023   CHOL 94 04/04/2023   TRIG 84.0 04/04/2023   HDL 31.90 (L) 04/04/2023   LDLCALC 45 04/04/2023   ALT 13 10/30/2023   AST 18 10/30/2023  NA 142 10/30/2023   K 4.2 10/30/2023   CL 105 10/30/2023   CREATININE 0.77 10/30/2023   BUN 13 10/30/2023   CO2 32 10/30/2023   TSH 2.050 10/30/2023   INR 1.3 (H) 04/09/2023   HGBA1C 6.0 04/04/2023    PFTs 01/26/2021: FVC 2.39 (72%), post 2.63 (79%). FEV1 1.62 (65%), post 1.79 (72%).  DLCO unc 14.10 (63%). Obstructive defect with significant response to BDL. Normal lung volumes and moderately reduced DLCO.   IMAGES: CT Chest/abd/pelvis 11/07/2023: IMPRESSION: 1. No acute intrathoracic pathology. 2. A 5 cm long segment of wall thickening of the mid transverse colon consistent with malignancy. No bowel obstruction. 3. Retroperitoneal adenopathy with infiltrative encasement of the infrarenal abdominal aorta as seen on the CT of 2024. 4. Mild fullness of the renal collecting systems bilaterally similar to prior CT of 2024. 5.  Aortic Atherosclerosis (ICD10-I70.0).   EKG: 10/29/2023: Sinus bradycardia at 53 bpm Left axis deviation Nonspecific ST and T wave abnormality When compared with ECG of 08-Apr-2023 14:57, No significant change was found Confirmed by Perla Lye [id number]) on 10/29/2023 11:03:39 AM   CV: Echo 05/12/2015 (Dr. Levern, [facility]): Summary: The left ventricle shows normal contractility in size.  Estimated EF 55 to 60%. Mild aortic regurgitation. Trace tricuspid regurgitation seen. No intracardiac shunting noted. No intracardiac mass or thrombi seen.  No pericardial effusion seen.  Nuclear stress test 08/05/2013: IMPRESSION: 1. Small relatively matched area of attenuation involving the inferior wall of the left ventricle without associated regional wall motion abnormality. No definite scintigraphic evidence of prior infarction or pharmacologically induced ischemia. 2. Normal wall motion. Ejection fraction - 54%. (Previous ejection fraction - 68%).   Cardiac cath 07/27/2008: FINDINGS:  LV showed mild LVH, good LV systolic function, EF of 55-60%. Left main was patent.  LAD has 10-15% ostial stenosis, which is more apparent in LAO cranial view and 15-20% mid stenosis, distally the vessel is diffusely diseased.  Diagonal 1 and 2 were very small, diagonal III was small which was patent.  Left circumflex is patent.  OM-1 and OM-2 were  very very small, OM-3 was very small, which was patent, OM-4 was small which was patent.  RCA has 15-20% mid in-stent restenosis.  PDA is very very small. PLV branches are small, which are patent.   US  Carotid 10/13/2004: IMPRESSION:  1)Slight calcific plaque formation is seen bilaterally.  2)No stenosis is identified on either side.  3)Antegrade flow is noted in both vertebrals.     Past Medical History:  Diagnosis Date   A-fib [facility])    Anxiety    Arthritis    Chronic back pain    Colon cancer (HCC) 10/2023   COPD (chronic obstructive pulmonary disease) (HCC)    Coronary artery disease    COVID-19    08/22/20 likely contracted late 07/2020   Depression    Glaucoma    Hearing aid worn    High cholesterol    Hypertension    Lymphadenopathy 01/25/2016   Myocardial infarction (HCC)    2000   Non Hodgkin's lymphoma (HCC)    Wears dentures     Top plate   Wears glasses     Past Surgical History:  Procedure Laterality Date   CARDIAC CATHETERIZATION     CATARACT EXTRACTION W/ INTRAOCULAR LENS IMPLANT     CHOLECYSTECTOMY     COLONOSCOPY N/A 10/10/2023   Procedure: COLONOSCOPY;  Surgeon: Unk Corinn Skiff, MD;  Location: [facility] ENDOSCOPY;  Service: Gastroenterology;  Laterality: N/A;  COLONOSCOPY W/ BIOPSIES AND POLYPECTOMY     CORONARY STENT PLACEMENT     ESOPHAGOGASTRODUODENOSCOPY N/A 10/10/2023   Procedure: EGD (ESOPHAGOGASTRODUODENOSCOPY);  Surgeon: Unk Corinn Skiff, MD;  Location: [facility] ENDOSCOPY;  Service: Gastroenterology;  Laterality: N/A;   INGUINAL LYMPH NODE BIOPSY Right 01/25/2016   Procedure: EXCISION DEEP RIGHT INGUINAL LYMPH NODE BIOPSY WITH ULTRA SOUND;  Surgeon: Elon Pacini, MD;  Location: [facility] OR;  Service: General;  Laterality: Right;   POLYPECTOMY  10/10/2023   Procedure: POLYPECTOMY, INTESTINE;  Surgeon: Unk Corinn Skiff, MD;  Location: [facility] ENDOSCOPY;  Service: Gastroenterology;;   SPINE SURGERY      MEDICATIONS: No current facility-administered  medications for this encounter.    acetaminophen  (TYLENOL ) 500 MG tablet   albuterol  (VENTOLIN  HFA) 108 (90 Base) MCG/ACT inhaler   ALPRAZolam  (XANAX ) 0.25 MG tablet   amiodarone  (PACERONE ) 200 MG tablet   amLODipine  (NORVASC ) 2.5 MG tablet   apixaban  (ELIQUIS ) 5 MG TABS tablet   atorvastatin  (LIPITOR) 40 MG tablet   b complex vitamins capsule   fluticasone  furoate-vilanterol (BREO ELLIPTA ) 100-25 MCG/ACT AEPB   gabapentin  (NEURONTIN ) 100 MG capsule   metoprolol  succinate (TOPROL -XL) 50 MG 24 hr tablet   nitroGLYCERIN  (NITROSTAT ) 0.4 MG SL tablet   pantoprazole  (PROTONIX ) 40 MG tablet   PARoxetine  (PAXIL ) 10 MG tablet   traMADol  (ULTRAM ) 50 MG tablet    Isaiah Ruder, PA-C Surgical Short Stay/Anesthesiology [facility] Phone [phone] [facility] Phone [phone] 11/19/2023 3:04 PM

## 2023-11-20 ENCOUNTER — Ambulatory Visit (HOSPITAL_COMMUNITY): Payer: Self-pay | Admitting: Vascular Surgery

## 2023-11-20 ENCOUNTER — Other Ambulatory Visit (HOSPITAL_COMMUNITY): Payer: Self-pay

## 2023-11-20 ENCOUNTER — Encounter (HOSPITAL_COMMUNITY)
Admission: RE | Disposition: A | Discharge status: Home / Self Care | Payer: Self-pay | Source: Home / Self Care | Attending: Orthopedic Surgery

## 2023-11-20 ENCOUNTER — Other Ambulatory Visit: Payer: Self-pay

## 2023-11-20 ENCOUNTER — Ambulatory Visit (HOSPITAL_COMMUNITY)
Admission: RE | Admit: 2023-11-20 | Discharge: 2023-11-20 | Disposition: A | Discharge status: Home / Self Care | Attending: Orthopedic Surgery | Admitting: Orthopedic Surgery

## 2023-11-20 ENCOUNTER — Ambulatory Visit (HOSPITAL_COMMUNITY)

## 2023-11-20 ENCOUNTER — Encounter (HOSPITAL_COMMUNITY): Payer: Self-pay | Admitting: Orthopedic Surgery

## 2023-11-20 DIAGNOSIS — Z8572 Personal history of non-Hodgkin lymphomas: Secondary | ICD-10-CM | POA: Insufficient documentation

## 2023-11-20 DIAGNOSIS — K219 Gastro-esophageal reflux disease without esophagitis: Secondary | ICD-10-CM | POA: Diagnosis not present

## 2023-11-20 DIAGNOSIS — J449 Chronic obstructive pulmonary disease, unspecified: Secondary | ICD-10-CM | POA: Insufficient documentation

## 2023-11-20 DIAGNOSIS — S82841A Displaced bimalleolar fracture of right lower leg, initial encounter for closed fracture: Secondary | ICD-10-CM | POA: Insufficient documentation

## 2023-11-20 DIAGNOSIS — I700 Atherosclerosis of aorta: Secondary | ICD-10-CM | POA: Diagnosis not present

## 2023-11-20 DIAGNOSIS — I2510 Atherosclerotic heart disease of native coronary artery without angina pectoris: Secondary | ICD-10-CM | POA: Insufficient documentation

## 2023-11-20 DIAGNOSIS — I4891 Unspecified atrial fibrillation: Secondary | ICD-10-CM | POA: Diagnosis not present

## 2023-11-20 DIAGNOSIS — I252 Old myocardial infarction: Secondary | ICD-10-CM | POA: Diagnosis not present

## 2023-11-20 DIAGNOSIS — S82891A Other fracture of right lower leg, initial encounter for closed fracture: Secondary | ICD-10-CM

## 2023-11-20 DIAGNOSIS — F17210 Nicotine dependence, cigarettes, uncomplicated: Secondary | ICD-10-CM

## 2023-11-20 DIAGNOSIS — F419 Anxiety disorder, unspecified: Secondary | ICD-10-CM | POA: Insufficient documentation

## 2023-11-20 DIAGNOSIS — Z7951 Long term (current) use of inhaled steroids: Secondary | ICD-10-CM | POA: Diagnosis not present

## 2023-11-20 DIAGNOSIS — C189 Malignant neoplasm of colon, unspecified: Secondary | ICD-10-CM | POA: Insufficient documentation

## 2023-11-20 DIAGNOSIS — I10 Essential (primary) hypertension: Secondary | ICD-10-CM | POA: Diagnosis not present

## 2023-11-20 DIAGNOSIS — F32A Depression, unspecified: Secondary | ICD-10-CM | POA: Insufficient documentation

## 2023-11-20 DIAGNOSIS — I351 Nonrheumatic aortic (valve) insufficiency: Secondary | ICD-10-CM | POA: Diagnosis not present

## 2023-11-20 DIAGNOSIS — W19XXXA Unspecified fall, initial encounter: Secondary | ICD-10-CM | POA: Insufficient documentation

## 2023-11-20 DIAGNOSIS — G8918 Other acute postprocedural pain: Secondary | ICD-10-CM | POA: Diagnosis not present

## 2023-11-20 HISTORY — PX: ORIF ANKLE FRACTURE: SHX5408

## 2023-11-20 HISTORY — DX: Gastro-esophageal reflux disease without esophagitis: K21.9

## 2023-11-20 HISTORY — DX: Anemia, unspecified: D64.9

## 2023-11-20 LAB — COMPREHENSIVE METABOLIC PANEL WITH GFR
ALT: 11 U/L (ref 0–44)
AST: 16 U/L (ref 15–41)
Albumin: 2.9 g/dL — ABNORMAL LOW (ref 3.5–5.0)
Alkaline Phosphatase: 50 U/L (ref 38–126)
Anion gap: 10 (ref 5–15)
BUN: 8 mg/dL (ref 8–23)
CO2: 29 mmol/L (ref 22–32)
Calcium: 8.4 mg/dL — ABNORMAL LOW (ref 8.9–10.3)
Chloride: 102 mmol/L (ref 98–111)
Creatinine, Ser: 0.69 mg/dL (ref 0.44–1.00)
GFR, Estimated: >60 mL/min (ref >60)
Glucose, Bld: 105 mg/dL — ABNORMAL HIGH (ref 70–99)
Potassium: 3.2 mmol/L — ABNORMAL LOW (ref 3.5–5.1)
Sodium: 141 mmol/L (ref 135–145)
Total Bilirubin: 0.8 mg/dL (ref 0.0–1.2)
Total Protein: 6.2 g/dL — ABNORMAL LOW (ref 6.5–8.1)

## 2023-11-20 LAB — CBC WITH DIFFERENTIAL/PLATELET
Abs Immature Granulocytes: 0.06 K/uL (ref 0.00–0.07)
Basophils Absolute: 0.1 K/uL (ref 0.0–0.1)
Basophils Relative: 1 %
Eosinophils Absolute: 0.1 K/uL (ref 0.0–0.5)
Eosinophils Relative: 1 %
HCT: 37 % (ref 36.0–46.0)
Hemoglobin: 11.6 g/dL — ABNORMAL LOW (ref 12.0–15.0)
Immature Granulocytes: 1 %
Lymphocytes Relative: 19 %
Lymphs Abs: 2 K/uL (ref 0.7–4.0)
MCH: 29.7 pg (ref 26.0–34.0)
MCHC: 31.4 g/dL (ref 30.0–36.0)
MCV: 94.6 fL (ref 80.0–100.0)
Monocytes Absolute: 0.9 K/uL (ref 0.1–1.0)
Monocytes Relative: 8 %
Neutro Abs: 7.6 K/uL (ref 1.7–7.7)
Neutrophils Relative %: 70 %
Platelets: 275 K/uL (ref 150–400)
RBC: 3.91 MIL/uL (ref 3.87–5.11)
RDW: 14.6 % (ref 11.5–15.5)
WBC: 10.7 K/uL — ABNORMAL HIGH (ref 4.0–10.5)
nRBC: 0 % (ref 0.0–0.2)

## 2023-11-20 LAB — SURGICAL PCR SCREEN
MRSA, PCR: NEGATIVE
Staphylococcus aureus: NEGATIVE

## 2023-11-20 SURGERY — OPEN REDUCTION INTERNAL FIXATION (ORIF) ANKLE FRACTURE
Anesthesia: Monitor Anesthesia Care | Site: Ankle | Laterality: Right

## 2023-11-20 MED ORDER — DROPERIDOL 2.5 MG/ML IJ SOLN: 0.6250 mg | Freq: Once | INTRAMUSCULAR | Status: DC | PRN

## 2023-11-20 MED ORDER — ACETAMINOPHEN 500 MG PO TABS: 1000.0000 mg | ORAL_TABLET | ORAL | Status: DC

## 2023-11-20 MED ORDER — LACTATED RINGERS IV SOLN: INTRAVENOUS | Status: DC

## 2023-11-20 MED ORDER — GABAPENTIN 300 MG PO CAPS: 300.0000 mg | ORAL_CAPSULE | ORAL | Status: DC

## 2023-11-20 MED ORDER — CHLORHEXIDINE GLUCONATE 0.12 % MT SOLN
15.0000 mL | Freq: Once | OROMUCOSAL | Status: AC
  Administered 2023-11-20: 15 mL via OROMUCOSAL
  Filled 2023-11-20: qty 15

## 2023-11-20 MED ORDER — BISACODYL 5 MG PO TBEC: 20.0000 mg | DELAYED_RELEASE_TABLET | Freq: Once | ORAL | Status: DC

## 2023-11-20 MED ORDER — ORAL CARE MOUTH RINSE: 15.0000 mL | Freq: Once | OROMUCOSAL | Status: AC

## 2023-11-20 MED ORDER — ALVIMOPAN 12 MG PO CAPS: 12.0000 mg | ORAL_CAPSULE | ORAL | Status: DC

## 2023-11-20 MED ORDER — FENTANYL CITRATE (PF) 100 MCG/2ML IJ SOLN
25.0000 ug | INTRAMUSCULAR | Status: DC | PRN
  Administered 2023-11-20: 25 ug via INTRAVENOUS

## 2023-11-20 MED ORDER — TRAMADOL HCL 50 MG PO TABS
50.0000 mg | ORAL_TABLET | Freq: Four times a day (QID) | ORAL | 0 refills | Status: DC | PRN
  Filled 2023-11-20: qty 30, 8d supply, fill #0

## 2023-11-20 MED ORDER — FENTANYL CITRATE (PF) 100 MCG/2ML IJ SOLN: 50.0000 ug | Freq: Once | INTRAMUSCULAR | Status: AC

## 2023-11-20 MED ORDER — CEFAZOLIN SODIUM-DEXTROSE 2-4 GM/100ML-% IV SOLN
2.0000 g | INTRAVENOUS | Status: AC
  Administered 2023-11-20: 2 g via INTRAVENOUS
  Filled 2023-11-20: qty 100

## 2023-11-20 MED ORDER — ACETAMINOPHEN 10 MG/ML IV SOLN
1000.0000 mg | Freq: Once | INTRAVENOUS | Status: DC | PRN
Start: 2023-11-20 — End: 2023-11-20

## 2023-11-20 MED ORDER — OXYCODONE HCL 5 MG PO TABS
ORAL_TABLET | ORAL | Status: AC
  Filled 2023-11-20: qty 1

## 2023-11-20 MED ORDER — MIDAZOLAM HCL 2 MG/2ML IJ SOLN
INTRAMUSCULAR | Status: AC
  Administered 2023-11-20: 1 mg via INTRAVENOUS
  Filled 2023-11-20: qty 2

## 2023-11-20 MED ORDER — ENSURE PRE-SURGERY PO LIQD: 592.0000 mL | Freq: Once | ORAL | Status: DC

## 2023-11-20 MED ORDER — ONDANSETRON HCL 4 MG/2ML IJ SOLN
INTRAMUSCULAR | Status: DC | PRN
  Administered 2023-11-20: 4 mg via INTRAVENOUS

## 2023-11-20 MED ORDER — MIDAZOLAM HCL 2 MG/2ML IJ SOLN: 1.0000 mg | Freq: Once | INTRAMUSCULAR | Status: AC

## 2023-11-20 MED ORDER — ENSURE PRE-SURGERY PO LIQD: 296.0000 mL | Freq: Once | ORAL | Status: DC

## 2023-11-20 MED ORDER — FENTANYL CITRATE (PF) 100 MCG/2ML IJ SOLN
INTRAMUSCULAR | Status: AC
  Administered 2023-11-20: 50 ug via INTRAVENOUS
  Filled 2023-11-20: qty 2

## 2023-11-20 MED ORDER — FENTANYL CITRATE (PF) 100 MCG/2ML IJ SOLN
INTRAMUSCULAR | Status: AC
  Filled 2023-11-20: qty 2

## 2023-11-20 MED ORDER — POLYETHYLENE GLYCOL 3350 17 GM/SCOOP PO POWD: 238.0000 g | Freq: Once | ORAL | Status: DC

## 2023-11-20 MED ORDER — PROPOFOL 10 MG/ML IV BOLUS
INTRAVENOUS | Status: AC
  Filled 2023-11-20: qty 20

## 2023-11-20 MED ORDER — VASHE WOUND IRRIGATION OPTIME
TOPICAL | Status: DC | PRN
  Administered 2023-11-20: 34 [oz_av]

## 2023-11-20 MED ORDER — FENTANYL CITRATE (PF) 250 MCG/5ML IJ SOLN
INTRAMUSCULAR | Status: AC
  Filled 2023-11-20: qty 5

## 2023-11-20 MED ORDER — HEPARIN SODIUM (PORCINE) 5000 UNIT/ML IJ SOLN: 5000.0000 [IU] | Freq: Once | INTRAMUSCULAR | Status: DC

## 2023-11-20 MED ORDER — SODIUM CHLORIDE 0.9 % IV SOLN: 2.0000 g | INTRAVENOUS | Status: DC

## 2023-11-20 MED ORDER — PROPOFOL 10 MG/ML IV BOLUS
INTRAVENOUS | Status: DC | PRN
  Administered 2023-11-20: 100 mg via INTRAVENOUS

## 2023-11-20 MED ORDER — OXYCODONE HCL 5 MG/5ML PO SOLN: 5.0000 mg | Freq: Once | ORAL | Status: AC | PRN

## 2023-11-20 MED ORDER — BUPIVACAINE HCL (PF) 0.25 % IJ SOLN
INTRAMUSCULAR | Status: DC | PRN
Start: 2023-11-20 — End: 2023-11-20
  Administered 2023-11-20: 25 mL via EPIDURAL
  Administered 2023-11-20: 20 mL via EPIDURAL

## 2023-11-20 MED ORDER — DEXAMETHASONE SODIUM PHOSPHATE 10 MG/ML IJ SOLN
INTRAMUSCULAR | Status: DC | PRN
  Administered 2023-11-20: 10 mg via INTRAVENOUS

## 2023-11-20 MED ORDER — OXYCODONE HCL 5 MG PO TABS
5.0000 mg | ORAL_TABLET | Freq: Once | ORAL | Status: AC | PRN
  Administered 2023-11-20: 5 mg via ORAL

## 2023-11-20 SURGICAL SUPPLY — 38 items
BAG COUNTER SPONGE SURGICOUNT (BAG) ×1 IMPLANT
BANDAGE ESMARK 6X9 LF (GAUZE/BANDAGES/DRESSINGS) IMPLANT
BIT DRILL 110X2.5XQCK CNCT (BIT) IMPLANT
BIT DRILL 2.7XCANN QCK CNCT (BIT) IMPLANT
BNDG COHESIVE 4X5 TAN STRL LF (GAUZE/BANDAGES/DRESSINGS) ×1 IMPLANT
BNDG GAUZE DERMACEA FLUFF 4 (GAUZE/BANDAGES/DRESSINGS) ×1 IMPLANT
COVER SURGICAL LIGHT HANDLE (MISCELLANEOUS) ×1 IMPLANT
DRAPE OEC MINIVIEW 54X84 (DRAPES) IMPLANT
DRAPE U-SHAPE 47X51 STRL (DRAPES) ×1 IMPLANT
DRSG ADAPTIC 3X8 NADH LF (GAUZE/BANDAGES/DRESSINGS) ×1 IMPLANT
DURAPREP 26ML APPLICATOR (WOUND CARE) ×1 IMPLANT
ELECTRODE REM PT RTRN 9FT ADLT (ELECTROSURGICAL) ×1 IMPLANT
GAUZE PAD ABD 8X10 STRL (GAUZE/BANDAGES/DRESSINGS) ×1 IMPLANT
GAUZE SPONGE 4X4 12PLY STRL (GAUZE/BANDAGES/DRESSINGS) ×1 IMPLANT
GLOVE BIOGEL PI IND STRL 9 (GLOVE) ×1 IMPLANT
GLOVE SURG ORTHO 9.0 STRL STRW (GLOVE) ×1 IMPLANT
GOWN STRL REUS W/ TWL XL LVL3 (GOWN DISPOSABLE) ×3 IMPLANT
KIT BASIN OR (CUSTOM PROCEDURE TRAY) ×1 IMPLANT
KIT TURNOVER KIT B (KITS) ×1 IMPLANT
KWIRE 1.25 PACK OF 10 (WIRE) IMPLANT
MANIFOLD NEPTUNE II (INSTRUMENTS) ×1 IMPLANT
NS IRRIG 1000ML POUR BTL (IV SOLUTION) ×1 IMPLANT
PACK ORTHO EXTREMITY (CUSTOM PROCEDURE TRAY) ×1 IMPLANT
PAD ARMBOARD POSITIONER FOAM (MISCELLANEOUS) ×2 IMPLANT
PLATE 7HOLE 1/3 TUBULAR (Plate) IMPLANT
SCREW CANN 1/2THD 40X4.0 (Screw) IMPLANT
SCREW CANN 1/3 THRD RVRS CT (Screw) IMPLANT
SCREW CORTICAL 3.5 16MM (Screw) IMPLANT
SCREW CORTICAL 3.5 18MM (Screw) IMPLANT
SCREW CORTICAL 3.5X10 (Screw) IMPLANT
SCREW CORTICAL 3.5X12 (Screw) IMPLANT
STAPLER SKIN PROX 35W (STAPLE) IMPLANT
SUCTION TUBE FRAZIER 10FR DISP (SUCTIONS) ×1 IMPLANT
SUT ETHILON 2 0 PSLX (SUTURE) IMPLANT
SUT VIC AB 2-0 CT1 TAPERPNT 27 (SUTURE) ×1 IMPLANT
TOWEL GREEN STERILE (TOWEL DISPOSABLE) ×1 IMPLANT
TOWEL GREEN STERILE FF (TOWEL DISPOSABLE) ×1 IMPLANT
TUBE CONNECTING 12X1/4 (SUCTIONS) ×1 IMPLANT

## 2023-11-20 NOTE — Transfer of Care (Signed)
 Immediate Anesthesia Transfer of Care Note  Patient: Samantha [name]  Procedure(s) Performed: OPEN REDUCTION INTERNAL FIXATION (ORIF) ANKLE FRACTURE (Right: Ankle)  Patient Location: PACU  Anesthesia Type:General and GA combined with regional for post-op pain  Level of Consciousness: drowsy  Airway & Oxygen Therapy: Patient Spontanous Breathing  Post-op Assessment: Report given to RN, Post -op Vital signs reviewed and stable, and Patient moving all extremities X 4  Post vital signs: Reviewed and stable  Last Vitals:  Vitals Value Taken Time  BP 131/102 11/20/23 13:05  Temp    Pulse 54 11/20/23 13:09  Resp 16 11/20/23 13:09  SpO2 90 % 11/20/23 13:09  Vitals shown include unfiled device data.  Last Pain:  Vitals:   11/20/23 1032  TempSrc:   PainSc: 0-No pain         Complications: No notable events documented.

## 2023-11-20 NOTE — Anesthesia Postprocedure Evaluation (Signed)
 Anesthesia Post Note  Patient: Samantha [name]  Procedure(s) Performed: OPEN REDUCTION INTERNAL FIXATION (ORIF) ANKLE FRACTURE (Right: Ankle)     Patient location during evaluation: PACU Anesthesia Type: Regional and MAC Level of consciousness: awake and alert Pain management: pain level controlled Vital Signs Assessment: post-procedure vital signs reviewed and stable Respiratory status: spontaneous breathing, nonlabored ventilation, respiratory function stable and patient connected to nasal cannula oxygen Cardiovascular status: blood pressure returned to baseline and stable Postop Assessment: no apparent nausea or vomiting Anesthetic complications: no   No notable events documented.  Last Vitals:  Vitals:   11/20/23 1340 11/20/23 1345  BP:  (!) 156/46  Pulse: (!) 51 (!) 55  Resp: 12 17  Temp:  36.8 C  SpO2: 100% 98%    Last Pain:  Vitals:   11/20/23 1340  TempSrc:   PainSc: 4                  Thom JONELLE Peoples

## 2023-11-20 NOTE — Op Note (Signed)
 11/20/2023  1:11 PM  PATIENT:  Samantha [name]    PRE-OPERATIVE DIAGNOSIS: Closed right Bimalleolar Ankle Fracture  POST-OPERATIVE DIAGNOSIS:  Same  PROCEDURE:  OPEN REDUCTION INTERNAL FIXATION (ORIF) ANKLE FRACTURE with internal fixation of the lateral and medial malleolus.  C arm fluoroscopy to verify reduction.  SURGEON:  Jerona LULLA Sage, MD  PHYSICIAN ASSISTANT:None ANESTHESIA:   General  PREOPERATIVE INDICATIONS:  [name] is a  83 y.o. female with a diagnosis of Right Bimalleolar Ankle Fracture who failed conservative measures and elected for surgical management.    The risks benefits and alternatives were discussed with the patient preoperatively including but not limited to the risks of infection, bleeding, nerve injury, cardiopulmonary complications, the need for revision surgery, among others, and the patient was willing to proceed.  OPERATIVE IMPLANTS:   Implant Name Type Inv. Item Serial No. Manufacturer Lot No. LRB No. Used Action  PLATE 7HOLE 1/3 TUBULAR - [id number] Plate PLATE 7HOLE 1/3 TUBULAR  [employer](ORTH,TRAU,BIO,SG)  Right 1 Implanted  SCREW CORTICAL 3.5 - [id number] Screw SCREW CORTICAL 3.5  [employer](ORTH,TRAU,BIO,SG)  Right 1 Implanted  SCREW CORTICAL 3.5X12 - [id number] Screw SCREW CORTICAL 3.5X12  [employer](ORTH,TRAU,BIO,SG)  Right 1 Implanted  SCREW CORTICAL 3.5 - [id number] Screw SCREW CORTICAL 3.5  [employer](ORTH,TRAU,BIO,SG)  Right 2 Implanted  SCREW CORTICAL 3.5X10 - [id number] Screw SCREW CORTICAL 3.5X10  [employer](ORTH,TRAU,BIO,SG)  Right 1 Implanted  SCREW CANN 1/2THD 40X4.0 - [id number] Screw SCREW CANN 1/2THD 40X4.0  [employer](ORTH,TRAU,BIO,SG)  Right 1 Implanted  SCREW CANN 1/3 THRD RVRS CT - [id number] Screw SCREW CANN 1/3 THRD RVRS CT  [employer](ORTH,TRAU,BIO,SG)  Right 1 Implanted    @ENCIMAGES @  OPERATIVE FINDINGS: C-arm fluoroscopy verified the fibula out to length and a congruent  mortise.  OPERATIVE PROCEDURE: Patient was brought the operating room and underwent a general anesthetic.  After adequate levels anesthesia were obtained patient's right lower extremity was prepped using DuraPrep draped into a sterile field a timeout was called.  Incision was made over the fibula and this was carried sharply down to bone.  Subperiosteal dissection was used to freshen the fracture site.  The fibula was brought out to length and a interfrag screw was used to stabilize the length.  A neutralization plate was then placed laterally and 2 compression screws were placed proximally and distally.  C-arm Prostabrit verified alignment.  The wound was irrigated with Vashe and the incision closed using 2-0 nylon.  Attention was then focused medially.  A medial incision was made this was carried down to the fracture site the soft tissue was removed from the fracture site and the joint reduced.  2 guidewires were placed from the medial malleolus into the tibia.  C-arm Shrosbree verified alignment and 2 partially-threaded 40 mm screws were used to stabilize the medial malleolus.  C-arm Foscavir verified alignment.  The medial incision was then irrigated with Vashe and closed with 2-0 nylon and a sterile compressive dressing was applied from the metatarsal heads to the tibial tubercle.  Patient was extubated taken the PACU in stable condition.   DISCHARGE PLANNING:  Antibiotic duration: Preoperative antibiotics  Weightbearing: Touchdown weightbearing on the right  Pain medication: Prescription for tramadol   Dressing care/ Wound VAC: Dry dressing  Ambulatory devices: Walker and fracture boot  Discharge to: Patient wishes for discharge to home she does have family support.  Follow-up: In the office 1 week post operative.

## 2023-11-20 NOTE — Interval H&P Note (Signed)
 History and Physical Interval Note:  11/20/2023 12:05 PM  Samantha [name]  has presented today for surgery, with the diagnosis of Right Bimalleolar Ankle Fracture.  The various methods of treatment have been discussed with the patient and family. After consideration of risks, benefits and other options for treatment, the patient has consented to  Procedure(s): OPEN REDUCTION INTERNAL FIXATION (ORIF) ANKLE FRACTURE (Right) as a surgical intervention.  The patient's history has been reviewed, patient examined, no change in status, stable for surgery.  I have reviewed the patient's chart and labs.  Questions were answered to the patient's satisfaction.     Tao Satz V Maxine Huynh

## 2023-11-20 NOTE — Discharge Instructions (Signed)
 No weight bearing in black boot.  Elevate your leg above your heart to reduce pain and swelling.  OK to wiggle your toes.

## 2023-11-20 NOTE — Anesthesia Procedure Notes (Signed)
 Anesthesia Regional Block: Adductor canal block   Pre-Anesthetic Checklist: , timeout performed,  Correct Patient, Correct Site, Correct Laterality,  Correct Procedure, Correct Position, site marked,  Risks and benefits discussed,  Surgical consent,  Pre-op evaluation,  At surgeon's request and post-op pain management  Laterality: Right  Prep: chloraprep       Needles:  Injection technique: Single-shot  Needle Type: Echogenic Stimulator Needle     Needle Length: 9cm  Needle Gauge: 21     Additional Needles:   Procedures:,,,, ultrasound used (permanent image in chart),,    Narrative:  Start time: 11/20/2023 11:30 AM End time: 11/20/2023 11:35 AM Injection made incrementally with aspirations every 5 mL.  Performed by: Personally  Anesthesiologist: Erma Thom SAUNDERS, MD  Additional Notes: Discussed risks and benefits of the nerve block in detail, including but not limited vascular injury, permanent nerve damage and infection.   Patient tolerated the procedure well. Local anesthetic introduced in an incremental fashion under minimal resistance after negative aspirations. No paresthesias were elicited. After completion of the procedure, no acute issues were identified and patient continued to be monitored by RN.

## 2023-11-20 NOTE — Anesthesia Procedure Notes (Signed)
 Procedure Name: Intubation Date/Time: 11/20/2023 12:16 PM  Performed by: Lamar Lucie DASEN, CRNAPre-anesthesia Checklist: Patient identified, Emergency Drugs available, Suction available and Patient being monitored Patient Re-evaluated:Patient Re-evaluated prior to induction Oxygen Delivery Method: Circle system utilized Preoxygenation: Pre-oxygenation with 100% oxygen Induction Type: IV induction Ventilation: Mask ventilation without difficulty LMA: LMA inserted LMA Size: 4.0 Number of attempts: 1 Placement Confirmation: positive ETCO2, breath sounds checked- equal and bilateral and CO2 detector Tube secured with: Tape Dental Injury: Teeth and Oropharynx as per pre-operative assessment

## 2023-11-20 NOTE — Anesthesia Procedure Notes (Signed)
 Anesthesia Regional Block: Popliteal block   Pre-Anesthetic Checklist: , timeout performed,  Correct Patient, Correct Site, Correct Laterality,  Correct Procedure, Correct Position, site marked,  Risks and benefits discussed,  Surgical consent,  Pre-op evaluation,  At surgeon's request and post-op pain management  Laterality: Right  Prep: chloraprep       Needles:  Injection technique: Single-shot  Needle Type: Echogenic Stimulator Needle     Needle Length: 9cm  Needle Gauge: 21     Additional Needles:   Procedures:,,,, ultrasound used (permanent image in chart),,    Narrative:  Start time: 11/20/2023 11:30 AM End time: 11/20/2023 11:35 AM Injection made incrementally with aspirations every 5 mL.  Performed by: Personally  Anesthesiologist: Erma Thom SAUNDERS, MD  Additional Notes: Discussed risks and benefits of the nerve block in detail, including but not limited vascular injury, permanent nerve damage and infection.   Patient tolerated the procedure well. Local anesthetic introduced in an incremental fashion under minimal resistance after negative aspirations. No paresthesias were elicited. After completion of the procedure, no acute issues were identified and patient continued to be monitored by RN.

## 2023-11-21 ENCOUNTER — Encounter (HOSPITAL_COMMUNITY): Payer: Self-pay | Admitting: Orthopedic Surgery

## 2023-11-25 ENCOUNTER — Encounter: Payer: Self-pay | Admitting: Family

## 2023-11-25 DIAGNOSIS — F419 Anxiety disorder, unspecified: Secondary | ICD-10-CM

## 2023-11-25 NOTE — Telephone Encounter (Signed)
  Last Visit: 08/29/2023 Next Visit: 02/27/2024 Last Refill:  E-Prescribing Status: Receipt confirmed by pharmacy (08/14/2022  2:38 PM EDT)  Please Advise

## 2023-11-26 ENCOUNTER — Other Ambulatory Visit: Payer: Self-pay | Admitting: Family

## 2023-11-26 DIAGNOSIS — F419 Anxiety disorder, unspecified: Secondary | ICD-10-CM

## 2023-11-26 MED ORDER — ALPRAZOLAM 0.25 MG PO TABS: 0.2500 mg | ORAL_TABLET | Freq: Every day | ORAL | 1 refills | Status: DC | PRN

## 2023-11-27 ENCOUNTER — Ambulatory Visit: Admitting: Physician Assistant

## 2023-11-27 ENCOUNTER — Encounter: Payer: Self-pay | Admitting: Physician Assistant

## 2023-11-27 ENCOUNTER — Other Ambulatory Visit: Payer: Self-pay

## 2023-11-27 DIAGNOSIS — Z9889 Other specified postprocedural states: Secondary | ICD-10-CM

## 2023-11-27 DIAGNOSIS — Z8781 Personal history of (healed) traumatic fracture: Secondary | ICD-10-CM

## 2023-11-27 NOTE — Progress Notes (Signed)
 Office Visit Note   Patient: Samantha [name]           Date of Birth: 28-Aug-1940           MRN: [id number] Visit Date: 11/27/2023              Requested by: Dineen Rollene MATSU, FNP [address] [place],  [place] [address] PCP: Dineen Rollene MATSU, FNP  Chief Complaint  Patient presents with   Right Ankle - Routine Post Op    11/20/23 ORIF right ankle fracture      HPI: 83 y/o female who sustained a bimalleolar fracture is now s/p ORIF 1 week out.  She has done well with TDWB in the cam boot for minimal activities to include transfers with a walker.  She showers with a shower guard.  Weight bearing will start once her incisions are healed.  Assessment & Plan: Visit Diagnoses:  1. S/P ORIF (open reduction internal fixation) fracture     Plan: Dry dressing daily, elevate multiple times a day for the swelling above your heart.  You may remove the dressing for showers dry well then apply a dry dressing.  Change dressing once a day.  Follow-Up Instructions: No follow-ups on file.   Ortho Exam  Patient is alert, oriented, no adenopathy, well-dressed, normal affect, normal respiratory effort. Moderate edema and ecchymosis in the foot and ankle Sutures in tact.  SS drainage from the incisions.  No cellulitis or purulence.   Imaging:        Labs: Lab Results  Component Value Date   HGBA1C 6.0 04/04/2023   HGBA1C 5.9 (A) 04/27/2022   HGBA1C 6.3 12/25/2021     Lab Results  Component Value Date   ALBUMIN 2.9 (L) 11/20/2023   ALBUMIN 3.8 10/30/2023   ALBUMIN 3.8 08/26/2023    Lab Results  Component Value Date   MG 1.9 04/09/2023   Lab Results  Component Value Date   VD25OH 16.77 (L) 04/04/2023   VD25OH 30.08 12/25/2021   VD25OH 25.10 (L) 08/23/2021    No results found for: PREALBUMIN    Latest Ref Rng & Units 11/20/2023   10:30 AM 10/30/2023    2:05 PM 08/26/2023   11:29 AM  CBC EXTENDED  WBC 4.0 - 10.5 K/uL 10.7  10.2  9.7   RBC 3.87 - 5.11  MIL/uL 3.91  4.25  4.15   Hemoglobin 12.0 - 15.0 g/dL 88.3  87.2  88.0   HCT 36.0 - 46.0 % 37.0  39.0  37.1   Platelets 150 - 400 K/uL 275  263  253   NEUT# 1.7 - 7.7 K/uL 7.6  6.2  6.3   Lymph# 0.7 - 4.0 K/uL 2.0  2.8  2.5      There is no height or weight on file to calculate BMI.  Orders:  Orders Placed This Encounter  Procedures   XR Ankle Complete Right   No orders of the defined types were placed in this encounter.    Procedures: No procedures performed  Clinical Data: No additional findings.  ROS:  All other systems negative, except as noted in the HPI. Review of Systems  Objective: Vital Signs: There were no vitals taken for this visit.  Specialty Comments:  No specialty comments available.  PMFS History: Patient Active Problem List   Diagnosis Date Noted   Closed fracture of right ankle 11/20/2023   Abdominal pain 07/17/2023   IDA (iron  deficiency anemia) 05/27/2023   Diverticulosis  of colon without hemorrhage 04/10/2023   Hematochezia 04/10/2023   Acute on chronic anemia 04/10/2023   Multifocal pneumonia 04/08/2023   GI bleed 04/08/2023   Excessive sweating 10/30/2022   Status post insertion of spinal cord stimulator 08/07/2022   Left foot pain 07/06/2022   Lumbar post-laminectomy syndrome 03/08/2022   Radicular pain of left lower extremity 03/08/2022   Prediabetes 12/25/2021   COPD GOLD 2 / still smoking  12/13/2020   Cigarette smoker 12/13/2020   B12 deficiency 10/19/2020   Tinea pedis 10/19/2020   Mucus plugging of bronchi 09/02/2020   Pulmonary artery hypertension (HCC) 09/02/2020   Coronary artery disease involving native coronary artery of native heart without angina pectoris 09/02/2020   Abnormal CT scan of lung 09/02/2020   Ground glass opacity present on imaging of lung 09/01/2020   Atherosclerosis of aorta (HCC) 08/23/2020   SOB (shortness of breath) 08/22/2020   HLD (hyperlipidemia) 06/22/2020   Hoarseness 07/25/2018    Intermittent atrial fibrillation (HCC) 05/30/2018   Lumbar foraminal stenosis 04/25/2018   HTN (hypertension) 03/26/2018   Anxiety and depression 03/26/2018   Routine physical examination 03/26/2018   Chronic low back pain 03/26/2018   Follicular lymphoma of lymph nodes of multiple regions (HCC) 01/30/2016   DDD (degenerative disc disease), lumbar 05/31/2015   Past Medical History:  Diagnosis Date   A-fib (HCC)    Anemia    hx iron  infusions   Anxiety    Arthritis    Chronic back pain    Colon cancer (HCC) 10/2023   COPD (chronic obstructive pulmonary disease) (HCC)    Coronary artery disease    COVID-19    08/22/20 likely contracted late 07/2020   Depression    GERD (gastroesophageal reflux disease)    Glaucoma    bilateral   Hearing aid worn    High cholesterol    Hypertension    Lymphadenopathy 01/25/2016   Myocardial infarction (HCC)    2000   Non Hodgkin's lymphoma (HCC)    Pneumonia 2025   x 1   Wears dentures     Top plate   Wears glasses     Family History  Problem Relation Age of Onset   Hypertension Mother    Stroke Mother    Heart disease Father    Heart disease Other     Past Surgical History:  Procedure Laterality Date   CARDIAC CATHETERIZATION     CATARACT EXTRACTION W/ INTRAOCULAR LENS IMPLANT     CHOLECYSTECTOMY     COLONOSCOPY N/A 10/10/2023   Procedure: COLONOSCOPY;  Surgeon: Unk Corinn Skiff, MD;  Location: [facility] ENDOSCOPY;  Service: Gastroenterology;  Laterality: N/A;   COLONOSCOPY W/ BIOPSIES AND POLYPECTOMY     CORONARY STENT PLACEMENT     ESOPHAGOGASTRODUODENOSCOPY N/A 10/10/2023   Procedure: EGD (ESOPHAGOGASTRODUODENOSCOPY);  Surgeon: Unk Corinn Skiff, MD;  Location: [facility] ENDOSCOPY;  Service: Gastroenterology;  Laterality: N/A;   INGUINAL LYMPH NODE BIOPSY Right 01/25/2016   Procedure: EXCISION DEEP RIGHT INGUINAL LYMPH NODE BIOPSY WITH ULTRA SOUND;  Surgeon: Elon Pacini, MD;  Location: [facility] OR;  Service: General;  Laterality:  Right;   ORIF ANKLE FRACTURE Right 11/20/2023   Procedure: OPEN REDUCTION INTERNAL FIXATION (ORIF) ANKLE FRACTURE;  Surgeon: Harden Jerona GAILS, MD;  Location: [facility] OR;  Service: Orthopedics;  Laterality: Right;   POLYPECTOMY  10/10/2023   Procedure: POLYPECTOMY, INTESTINE;  Surgeon: Unk Corinn Skiff, MD;  Location: [facility] ENDOSCOPY;  Service: Gastroenterology;;   SPINAL CORD STIMULATOR IMPLANT  04/20/2022   SPINE  SURGERY     Social History   Occupational History   Not on file  Tobacco Use   Smoking status: Every Day    Current packs/day: 0.50    Average packs/day: 0.5 packs/day for 60.0 years (30.0 ttl pk-yrs)    Types: Cigarettes   Smokeless tobacco: Never   Tobacco comments:    1/2 ppd--04/23/2023  Vaping Use   Vaping status: Never Used  Substance and Sexual Activity   Alcohol use: Yes    Comment: social - mixed drink   Drug use: No   Sexual activity: Not Currently    Birth control/protection: Post-menopausal

## 2023-12-05 NOTE — Progress Notes (Signed)
 Sent message, via epic in basket, requesting orders in epic from Careers adviser.

## 2023-12-06 ENCOUNTER — Ambulatory Visit: Payer: Self-pay | Admitting: General Surgery

## 2023-12-06 NOTE — H&P (Signed)
 REFERRING PHYSICIAN:  Onesimo Emaline Brink, MD   PROVIDER:  BERNARDA WANDA NED, MD   MRN: [id number] DOB: [birth year]    Subjective    Chief Complaint: New Cancer (colon adencarcinoma)       History of Present Illness: Samantha [name] is a 83 y.o. female who is seen today as an office consultation at the request of Dr. Onesimo for evaluation of New Cancer (colon adencarcinoma) .  83 year old female who underwent colonoscopy due to iron  deficiency anemia.  Multiple polyps were removed from the cecum, ascending colon, transverse colon and descending colon.  And infiltrated cysts submucosal ulcerated mass was found in the descending colon approximately 60 cm proximal to the anus.  Biopsies were taken and the area was tattooed.    Biopsy showed adenocarcinoma, well to moderately differentiated. Patient was referred to oncology and subsequently here for surgical evaluation.  Patient has a smoker and smokes at least 1 pack a day.  She has a history of stage IVb low-grade follicular lymphoma which was diagnosed in 2018 she is status post treatment and has had no evidence for disease progression over the last few years.  Medical history significant for COPD, atrial fibrillation history of MI and non-Hodgkin's lymphoma.  Surgical history significant for lap cholecystectomy.     Review of Systems: A complete review of systems was obtained from the patient.  I have reviewed this information and discussed as appropriate with the patient.  See HPI as well for other ROS.       Medical History:     Past Medical History:  Diagnosis Date   Allergic state     Cataract cortical, senile      Bilateral eyes   Chronic obstructive asthma, unspecified (CMS/HHS-HCC)     COPD (chronic obstructive pulmonary disease) (CMS/HHS-HCC)     Degenerative arthritis     Glaucoma (increased eye pressure)     History of shingles     History of TIA (transient ischemic attack)     Hyperlipidemia     Hypertension      Myocardial infarction (CMS/HHS-HCC) 2001   Neuralgia, neuritis, and radiculitis, unspecified     Unspecified hypothyroidism           Patient Active Problem List  Diagnosis   Lumbar radiculitis   DDD (degenerative disc disease), lumbar   Lumbar stenosis with neurogenic claudication   Chronic low back pain   Follicular lymphoma of lymph nodes of multiple regions (CMS/HHS-HCC)   HLD (hyperlipidemia)   HTN (hypertension)   Intermittent atrial fibrillation (CMS/HHS-HCC)           Past Surgical History:  Procedure Laterality Date   CHOLECYSTECTOMY   03/19/2000   Cardiac cath   03/19/2008   COLON@[facility]    10/10/2023    TA/RptPRN/RRV   EGD@[facility]    10/10/2023    NML/RptPRN/RRV   COLONOSCOPY   11/25/00; 01/22/11    Hyperplastic polyp   EXTRACTION TEETH        Wisdom teeth          Allergies  Allergen Reactions   Oxycodone  Rash   Hydrocodone Rash            Current Outpatient Medications on File Prior to Visit  Medication Sig Dispense Refill   ALPRAZolam  (XANAX ) 0.25 MG tablet Take 0.25 mg by mouth 2 (two) times daily as needed.       AMIOdarone  (PACERONE ) 200 MG tablet Take 0.5 tablets (100 mg total) by mouth once daily. 45 tablet 3  atorvastatin  (LIPITOR) 40 MG tablet Take 1 tablet (40 mg total) by mouth once daily. 90 tablet 3   bimatoprost (LUMIGAN) 0.01 % ophthalmic solution 1 drop nightly.       ELIQUIS  5 mg tablet Take 5 mg by mouth every 12 (twelve) hours.          gabapentin  (NEURONTIN ) 300 MG capsule Take 1 capsule (300 mg total) by mouth 3 (three) times daily. 270 capsule 3   melatonin 10 mg Tab Take 1 tablet by mouth 2 (two) times daily.       metoprolol  succinate (TOPROL -XL) 50 MG XL tablet Take 1 tablet (50 mg total) by mouth once daily. 90 tablet 3   nitroGLYcerin  (NITROSTAT ) 0.4 MG SL tablet Place 0.4 mg under the tongue every 5 (five) minutes as needed for Chest pain. May take up to 3 doses.       predniSONE  (DELTASONE ) 5 MG tablet Take as directed - 6 day  taper 21 tablet 0   sodium, potassium, and magnesium (SUPREP) oral solution Take 1 Bottle by mouth as directed One kit contains 2 bottles.  Take both bottles at the times instructed by your provider. 354 mL 0   traMADoL  (ULTRAM ) 50 mg tablet 1/2-1 po bid prn 25 tablet 1    No current facility-administered medications on file prior to visit.           Family History  Problem Relation Age of Onset   Stroke Mother     High blood pressure (Hypertension) Mother     Myocardial Infarction (Heart attack) Father     High blood pressure (Hypertension) Father     No Known Problems Sister     Heart disease Brother     Myocardial Infarction (Heart attack) Brother        Social History        Tobacco Use  Smoking Status Every Day   Current packs/day: 0.50   Average packs/day: 0.5 packs/day for 50.0 years (25.0 ttl pk-yrs)   Types: Cigarettes  Smokeless Tobacco Never      Social History         Socioeconomic History   Marital status: Married  Tobacco Use   Smoking status: Every Day      Current packs/day: 0.50      Average packs/day: 0.5 packs/day for 50.0 years (25.0 ttl pk-yrs)      Types: Cigarettes   Smokeless tobacco: Never  Substance and Sexual Activity   Alcohol use: Never   Drug use: Never   Sexual activity: Defer    Social Drivers of Health        Financial Resource Strain: Low Risk  (09/30/2023)    Received from [facility]    Overall Financial Resource Strain (CARDIA)     How hard is it for you to pay for the very basics like food, housing, medical care, and heating?: Not hard at all  Food Insecurity: No Food Insecurity (09/30/2023)    Received from [facility]    Hunger Vital Sign     Within the past 12 months, you worried that your food would run out before you got the money to buy more.: Never true     Within the past 12 months, the food you bought just didn't last and you didn't have money to get more.: Never true  Transportation Needs: No Transportation Needs  (09/30/2023)    Received from [facility] - Transportation     In the  past 12 months, has lack of transportation kept you from medical appointments or from getting medications?: No     In the past 12 months, has lack of transportation kept you from meetings, work, or from getting things needed for daily living?: No  Physical Activity: Inactive (09/30/2023)    Received from [facility]    Exercise Vital Sign     On average, how many days per week do you engage in moderate to strenuous exercise (like a brisk walk)?: 0 days     On average, how many minutes do you engage in exercise at this level?: 0 min  Stress: No Stress Concern Present (09/30/2023)    Received from [facility] - Occupational Stress Questionnaire     Do you feel stress - tense, restless, nervous, or anxious, or unable to sleep at night because your mind is troubled all the time - these days?: Not at all  Social Connections: Socially Isolated (09/30/2023)    Received from [facility]    Social Connection and Isolation Panel     In a typical week, how many times do you talk on the phone with family, friends, or neighbors?: More than three times a week     How often do you get together with friends or relatives?: More than three times a week     How often do you attend church or religious services?: Never     Do you belong to any clubs or organizations such as church groups, unions, fraternal or athletic groups, or school groups?: No     How often do you attend meetings of the clubs or organizations you belong to?: Never     Are you married, widowed, divorced, separated, never married, or living with a partner?: Widowed  Housing Stability: Unknown (09/26/2023)    Housing Stability Vital Sign     Homeless in the Last Year: No      Objective:         Vitals:      BP: (!) 168/69  Pulse: 67  Temp: 36.3 C (97.3 F)  TempSrc: Temporal  SpO2: 95%  Weight: 83.7 kg (184 lb  9.6 oz)  Height: 175.3 cm (5' 9)  PainSc: 0-No pain      Exam Gen: NAD Abd: soft     Labs, Imaging and Diagnostic Testing: CEA 13.5   Hgb 12.7   CT scan of the chest abdomen and pelvis shows colonic wall thickening in the mid transverse colon which would be consistent with her malignancy.  She also has some stable lymphadenopathy from her lymphoma and no findings of disease in the liver or lungs.     Assessment and Plan:  Malignant neoplasm of colon, unspecified part of colon (CMS/HHS-HCC)  (primary encounter diagnosis) Plan: polyethylene glycol (MIRALAX ) powder, bisacodyL        (DULCOLAX) 5 mg EC tablet, metroNIDAZOLE        (FLAGYL) 500 MG tablet, neomycin 500 mg tablet,       ondansetron  (ZOFRAN ) 4 MG tablet   83 year old female with a newly diagnosed colon cancer.  This was thought to be in the descending colon on colonoscopy but was felt to be in the transverse colon on CT scan.  We will evaluate intraoperatively and perform the appropriate surgical resection.  We discussed this in detail today.  We discussed the importance of minimizing her smoking is much as possible in the perioperative period to avoid complications.  All  questions were answered.  We will discuss risk stratification with her cardiologist as well as coming off of her Eliquis  prior to surgery.   The surgery and anatomy were described to the patient as well as the risks of surgery and the possible complications.  These include: Bleeding, deep abdominal infections and possible wound complications such as hernia and infection, damage to adjacent structures, leak of surgical connections, which can lead to other surgeries and possibly an ostomy, possible need for other procedures, such as abscess drains in radiology, possible prolonged hospital stay, possible diarrhea from removal of part of the colon, possible constipation from narcotics, possible bowel, bladder or sexual dysfunction if having rectal surgery, prolonged  fatigue/weakness or appetite loss, possible early recurrence of of disease, possible complications of their medical problems such as heart disease or arrhythmias or lung problems, death (less than 1%). I believe the patient understands and wishes to proceed with the surgery.    Bernarda JAYSON Ned, MD Colon and Rectal Surgery [facility]

## 2023-12-06 NOTE — H&P (View-Only) (Signed)
 REFERRING PHYSICIAN:  Onesimo Emaline Brink, MD   PROVIDER:  BERNARDA WANDA NED, MD   MRN: [id number] DOB: [birth year]    Subjective    Chief Complaint: New Cancer (colon adencarcinoma)       History of Present Illness: Samantha [name] is a 83 y.o. female who is seen today as an office consultation at the request of Dr. Onesimo for evaluation of New Cancer (colon adencarcinoma) .  83 year old female who underwent colonoscopy due to iron  deficiency anemia.  Multiple polyps were removed from the cecum, ascending colon, transverse colon and descending colon.  And infiltrated cysts submucosal ulcerated mass was found in the descending colon approximately 60 cm proximal to the anus.  Biopsies were taken and the area was tattooed.    Biopsy showed adenocarcinoma, well to moderately differentiated. Patient was referred to oncology and subsequently here for surgical evaluation.  Patient has a smoker and smokes at least 1 pack a day.  She has a history of stage IVb low-grade follicular lymphoma which was diagnosed in 2018 she is status post treatment and has had no evidence for disease progression over the last few years.  Medical history significant for COPD, atrial fibrillation history of MI and non-Hodgkin's lymphoma.  Surgical history significant for lap cholecystectomy.     Review of Systems: A complete review of systems was obtained from the patient.  I have reviewed this information and discussed as appropriate with the patient.  See HPI as well for other ROS.       Medical History:     Past Medical History:  Diagnosis Date   Allergic state     Cataract cortical, senile      Bilateral eyes   Chronic obstructive asthma, unspecified (CMS/HHS-HCC)     COPD (chronic obstructive pulmonary disease) (CMS/HHS-HCC)     Degenerative arthritis     Glaucoma (increased eye pressure)     History of shingles     History of TIA (transient ischemic attack)     Hyperlipidemia     Hypertension      Myocardial infarction (CMS/HHS-HCC) 2001   Neuralgia, neuritis, and radiculitis, unspecified     Unspecified hypothyroidism           Patient Active Problem List  Diagnosis   Lumbar radiculitis   DDD (degenerative disc disease), lumbar   Lumbar stenosis with neurogenic claudication   Chronic low back pain   Follicular lymphoma of lymph nodes of multiple regions (CMS/HHS-HCC)   HLD (hyperlipidemia)   HTN (hypertension)   Intermittent atrial fibrillation (CMS/HHS-HCC)           Past Surgical History:  Procedure Laterality Date   CHOLECYSTECTOMY   03/19/2000   Cardiac cath   03/19/2008   COLON@[facility]    10/10/2023    TA/RptPRN/RRV   EGD@[facility]    10/10/2023    NML/RptPRN/RRV   COLONOSCOPY   11/25/00; 01/22/11    Hyperplastic polyp   EXTRACTION TEETH        Wisdom teeth          Allergies  Allergen Reactions   Oxycodone  Rash   Hydrocodone Rash            Current Outpatient Medications on File Prior to Visit  Medication Sig Dispense Refill   ALPRAZolam  (XANAX ) 0.25 MG tablet Take 0.25 mg by mouth 2 (two) times daily as needed.       AMIOdarone  (PACERONE ) 200 MG tablet Take 0.5 tablets (100 mg total) by mouth once daily. 45 tablet 3  atorvastatin  (LIPITOR) 40 MG tablet Take 1 tablet (40 mg total) by mouth once daily. 90 tablet 3   bimatoprost (LUMIGAN) 0.01 % ophthalmic solution 1 drop nightly.       ELIQUIS  5 mg tablet Take 5 mg by mouth every 12 (twelve) hours.          gabapentin  (NEURONTIN ) 300 MG capsule Take 1 capsule (300 mg total) by mouth 3 (three) times daily. 270 capsule 3   melatonin 10 mg Tab Take 1 tablet by mouth 2 (two) times daily.       metoprolol  succinate (TOPROL -XL) 50 MG XL tablet Take 1 tablet (50 mg total) by mouth once daily. 90 tablet 3   nitroGLYcerin  (NITROSTAT ) 0.4 MG SL tablet Place 0.4 mg under the tongue every 5 (five) minutes as needed for Chest pain. May take up to 3 doses.       predniSONE  (DELTASONE ) 5 MG tablet Take as directed - 6 day  taper 21 tablet 0   sodium, potassium, and magnesium (SUPREP) oral solution Take 1 Bottle by mouth as directed One kit contains 2 bottles.  Take both bottles at the times instructed by your provider. 354 mL 0   traMADoL  (ULTRAM ) 50 mg tablet 1/2-1 po bid prn 25 tablet 1    No current facility-administered medications on file prior to visit.           Family History  Problem Relation Age of Onset   Stroke Mother     High blood pressure (Hypertension) Mother     Myocardial Infarction (Heart attack) Father     High blood pressure (Hypertension) Father     No Known Problems Sister     Heart disease Brother     Myocardial Infarction (Heart attack) Brother        Social History        Tobacco Use  Smoking Status Every Day   Current packs/day: 0.50   Average packs/day: 0.5 packs/day for 50.0 years (25.0 ttl pk-yrs)   Types: Cigarettes  Smokeless Tobacco Never      Social History         Socioeconomic History   Marital status: Married  Tobacco Use   Smoking status: Every Day      Current packs/day: 0.50      Average packs/day: 0.5 packs/day for 50.0 years (25.0 ttl pk-yrs)      Types: Cigarettes   Smokeless tobacco: Never  Substance and Sexual Activity   Alcohol use: Never   Drug use: Never   Sexual activity: Defer    Social Drivers of Health        Financial Resource Strain: Low Risk  (09/30/2023)    Received from [facility]    Overall Financial Resource Strain (CARDIA)     How hard is it for you to pay for the very basics like food, housing, medical care, and heating?: Not hard at all  Food Insecurity: No Food Insecurity (09/30/2023)    Received from [facility]    Hunger Vital Sign     Within the past 12 months, you worried that your food would run out before you got the money to buy more.: Never true     Within the past 12 months, the food you bought just didn't last and you didn't have money to get more.: Never true  Transportation Needs: No Transportation Needs  (09/30/2023)    Received from [facility] - Transportation     In the  past 12 months, has lack of transportation kept you from medical appointments or from getting medications?: No     In the past 12 months, has lack of transportation kept you from meetings, work, or from getting things needed for daily living?: No  Physical Activity: Inactive (09/30/2023)    Received from [facility]    Exercise Vital Sign     On average, how many days per week do you engage in moderate to strenuous exercise (like a brisk walk)?: 0 days     On average, how many minutes do you engage in exercise at this level?: 0 min  Stress: No Stress Concern Present (09/30/2023)    Received from [facility] - Occupational Stress Questionnaire     Do you feel stress - tense, restless, nervous, or anxious, or unable to sleep at night because your mind is troubled all the time - these days?: Not at all  Social Connections: Socially Isolated (09/30/2023)    Received from [facility]    Social Connection and Isolation Panel     In a typical week, how many times do you talk on the phone with family, friends, or neighbors?: More than three times a week     How often do you get together with friends or relatives?: More than three times a week     How often do you attend church or religious services?: Never     Do you belong to any clubs or organizations such as church groups, unions, fraternal or athletic groups, or school groups?: No     How often do you attend meetings of the clubs or organizations you belong to?: Never     Are you married, widowed, divorced, separated, never married, or living with a partner?: Widowed  Housing Stability: Unknown (09/26/2023)    Housing Stability Vital Sign     Homeless in the Last Year: No      Objective:         Vitals:      BP: (!) 168/69  Pulse: 67  Temp: 36.3 C (97.3 F)  TempSrc: Temporal  SpO2: 95%  Weight: 83.7 kg (184 lb  9.6 oz)  Height: 175.3 cm (5' 9)  PainSc: 0-No pain      Exam Gen: NAD Abd: soft     Labs, Imaging and Diagnostic Testing: CEA 13.5   Hgb 12.7   CT scan of the chest abdomen and pelvis shows colonic wall thickening in the mid transverse colon which would be consistent with her malignancy.  She also has some stable lymphadenopathy from her lymphoma and no findings of disease in the liver or lungs.     Assessment and Plan:  Malignant neoplasm of colon, unspecified part of colon (CMS/HHS-HCC)  (primary encounter diagnosis) Plan: polyethylene glycol (MIRALAX ) powder, bisacodyL        (DULCOLAX) 5 mg EC tablet, metroNIDAZOLE        (FLAGYL) 500 MG tablet, neomycin 500 mg tablet,       ondansetron  (ZOFRAN ) 4 MG tablet   83 year old female with a newly diagnosed colon cancer.  This was thought to be in the descending colon on colonoscopy but was felt to be in the transverse colon on CT scan.  We will evaluate intraoperatively and perform the appropriate surgical resection.  We discussed this in detail today.  We discussed the importance of minimizing her smoking is much as possible in the perioperative period to avoid complications.  All  questions were answered.  We will discuss risk stratification with her cardiologist as well as coming off of her Eliquis  prior to surgery.   The surgery and anatomy were described to the patient as well as the risks of surgery and the possible complications.  These include: Bleeding, deep abdominal infections and possible wound complications such as hernia and infection, damage to adjacent structures, leak of surgical connections, which can lead to other surgeries and possibly an ostomy, possible need for other procedures, such as abscess drains in radiology, possible prolonged hospital stay, possible diarrhea from removal of part of the colon, possible constipation from narcotics, possible bowel, bladder or sexual dysfunction if having rectal surgery, prolonged  fatigue/weakness or appetite loss, possible early recurrence of of disease, possible complications of their medical problems such as heart disease or arrhythmias or lung problems, death (less than 1%). I believe the patient understands and wishes to proceed with the surgery.    Bernarda JAYSON Ned, MD Colon and Rectal Surgery [facility]

## 2023-12-11 ENCOUNTER — Encounter: Payer: Self-pay | Admitting: Physician Assistant

## 2023-12-11 ENCOUNTER — Ambulatory Visit (INDEPENDENT_AMBULATORY_CARE_PROVIDER_SITE_OTHER): Admitting: Physician Assistant

## 2023-12-11 DIAGNOSIS — Z9889 Other specified postprocedural states: Secondary | ICD-10-CM

## 2023-12-11 DIAGNOSIS — Z8781 Personal history of (healed) traumatic fracture: Secondary | ICD-10-CM

## 2023-12-11 NOTE — Progress Notes (Signed)
 COVID Vaccine received:  []  No [x]  Yes Date of any COVID positive Test in last 90 days:  PCP - Rollene Northern, FNP [phone] (Work) [phone] (Fax)  Cardiologist - Evalene Lunger, MD, Jackee Alberts NP  cardiac clearance in 11-14-23 note Hematology- Emaline Saran, MD  Pulmonology- Dr. Darrin Barn   Chest x-ray - 07-17-2023  2v  Epic EKG -  10-29-2023  Epic Stress Test - 2015  ECHO - 05-12-2015  Epic Cardiac Cath - 01-21-2000 LHC/ PTCA w/ BMS x1 by Dr. Levern CT Coronary Calcium  score:   Bowel Prep - []  No  [x]   Yes _CCS prep  Pacemaker / ICD device [x]  No []  Yes   Spinal Cord Stimulator:[]  No [x]  Yes  Implanted 04-2022 by Dr. Rockey Pae at [facility].  REMOTE      History of Sleep Apnea? [x]  No []  Yes   CPAP used?- [x]  No []  Yes    Patient has: []  NO Hx DM   [x]  Pre-DM   []  DM1  []   DM2 Does the patient monitor blood sugar?   []  N/A   [x]  No []  Yes  Last A1c was:  6.0 on  04-04-23     Blood Thinner / Instructions:Eliquis  Hold x 48 hours  Last dose: Sunday 12-15-23 Aspirin Instructions:  Dental hx: [x]  Dentures:  []  N/A      []  Bridge or Partial:                   []  Loose or Damaged teeth:   Comments:   Activity level: Able to walk up 2 flights of stairs without becoming significantly short of breath or having chest pain?  []  No   []    Yes  Patient can perform ADLs without assistance. []  No   []   Yes  Anesthesia review: CAD (inferior STEMI, s/p mid RCA Bx Velocity stent 01/21/2000), PAF, HTN, COPD, GERD, Non-Hodgkin's Lymphoma, anemia, Glaucoma, spinal surgery (L4-5 PLIF 04/25/2018, [employer] spinal cord stimulator 04/20/2022, thoracic by operative note), hearing aids,  Patient denies any S&S of respiratory illness or Covid - no shortness of breath, fever, cough or chest pain at PAT appointment.  Patient verbalized understanding and agreement to the Pre-Surgical Instructions that were given to them at this PAT appointment. Patient was also educated of the need  to review these PAT instructions again prior to her surgery.I reviewed the appropriate phone numbers to call if they have any and questions or concerns.

## 2023-12-11 NOTE — Patient Instructions (Addendum)
 SURGICAL WAITING ROOM VISITATION Patients having surgery or a procedure may have no more than 2 support people in the waiting area - these visitors may rotate in the visitor waiting room.   If the patient needs to stay at the hospital during part of their recovery, the visitor guidelines for inpatient rooms apply.  PRE-OP VISITATION  Pre-op nurse will coordinate an appropriate time for 1 support person to accompany the patient in pre-op.  This support person may not rotate.  This visitor will be contacted when the time is appropriate for the visitor to come back in the pre-op area.  Please refer to the [facility] website for the visitor guidelines for Inpatients (after your surgery is over and you are in a regular room).  You are not required to quarantine at this time prior to your surgery. However, you must do this: Hand Hygiene often Do NOT share personal items Notify your provider if you are in close contact with someone who has COVID or you develop fever 100.4 or greater, new onset of sneezing, cough, sore throat, shortness of breath or body aches.  If you test positive for Covid or have been in contact with anyone that has tested positive in the last 10 days please notify you surgeon.    Your procedure is scheduled on:  Wednesday December 18, 2023  Report to [facility] Main Entrance: [address] entrance where the [employer] is available.   Report to admitting at: 11:15    AM  Call this number if you have any questions or problems the morning of surgery [phone]  FOLLOW ANY ADDITIONAL PRE OP INSTRUCTIONS YOU RECEIVED FROM YOUR SURGEON'S OFFICE!!!  Dulcolax 20 mg (total) - Take 4 (four) of the 5 mg Dulcolax tablets with water at 07:00 am the day prior to surgery.  Miralax  255 g - Mix with 64 oz Gatorade/Powerade.  Starting at 10:00 am ,Drink this gradually over the next few hours (8 oz glass every 15-30 minutes) until gone the day prior to surgery You should finish in 4  hours-6 hours.    Neomycin 1000 mg - At 2 pm, 3 pm and 10 pm after Miralax   bowel prep the day prior to surgery.  Metronidazole 1000 mg - At 2 pm, 3 pm and 10 pm after Miralax  bowel prep the day prior to surgery.   Drink plenty of clear liquids all evening to avoid getting dehydrated.   DRINK two (2) bottles of Pre-Surgery G2 drink starting at 6:00 pm the evening prior to your surgery to help prevent dehydration. Increase drinking clear fluids (see list below)          Do not eat food after Midnight the night prior to your surgery/procedure.  After Midnight you may have the following liquids until   10:30 AM DAY OF SURGERY  Clear Liquid Diet Water Black Coffee (sugar ok, NO MILK/CREAM OR CREAMERS)  Tea (sugar ok, NO MILK/CREAM OR CREAMERS) regular and decaf                             Plain Jell-O  with no fruit (NO RED)                                           Fruit ices (not with fruit pulp, NO RED)  Popsicles (NO RED)                                                                  Juice: NO CITRUS JUICES: only apple, WHITE grape, WHITE cranberry Sports drinks like Gatorade or Powerade (NO RED)                   The day of surgery:  Drink ONE (1) Pre-Surgery G2 at  10:30  AM the morning of surgery. Drink in one sitting. Do not sip.  This drink was given to you during your hospital pre-op appointment visit. Nothing else to drink after completing the Pre-Surgery G2 : No candy, chewing gum or throat lozenges.     Oral Hygiene is also important to reduce your risk of infection.        Remember - BRUSH YOUR TEETH THE MORNING OF SURGERY WITH YOUR REGULAR TOOTHPASTE  Do NOT smoke after Midnight the night before surgery.  STOP TAKING all Vitamins, Herbs and supplements 1 week before your surgery.   Take ONLY these medicines the morning of surgery with A SIP OF WATER: pantoprazole , amlodipine , gabapentin , metoprolol , paroxetine , amiodarone . You  may take Alprazolam  and EITHER Tylenol  OR Tramadol  if needed.   You may use your Breo Ellipta  and Albuterol  inhalers, if needed. Please bring your Albuterol  inhaler with you on the day of surgery.  BRING YOUR SPINAL CORD STIMULATOR REMOTE CONTROLLER with you on the day of surgery.                    You may not have any metal on your body including hair pins, jewelry, and body piercing  Do not wear make-up, lotions, powders, perfumes or deodorant  Do not wear nail polish including gel and S&S, artificial / acrylic nails, or any other type of covering on natural nails including finger and toenails. If you have artificial nails, gel coating, etc., that needs to be removed by a nail salon, Please have this removed prior to surgery. Not doing so may mean that your surgery could be cancelled or delayed if the Surgeon or anesthesia staff feels like they are unable to monitor you safely.   Do not shave 48 hours prior to surgery to avoid nicks in your skin which may contribute to postoperative infections.   Contacts, Hearing Aids, dentures or bridgework may not be worn into surgery. DENTURES WILL BE REMOVED PRIOR TO SURGERY PLEASE DO NOT APPLY Poly grip OR ADHESIVES!!!  You may bring a small overnight bag with you on the day of surgery, only pack items that are not valuable. [facility] IS NOT RESPONSIBLE   FOR VALUABLES THAT ARE LOST OR STOLEN.   Do not bring your home medications to the hospital. The Pharmacy will dispense medications listed on your medication list to you during your admission in the Hospital.  Please read over the following fact sheets you were given: IF YOU HAVE QUESTIONS ABOUT YOUR PRE-OP INSTRUCTIONS, PLEASE CALL [phone]   [facility] - Preparing for Surgery Before surgery, you can play an important role.  Because skin is not sterile, your skin needs to be as free of germs as possible.  You can reduce the number of germs on your skin by washing with  CHG (chlorahexidine  gluconate) soap before surgery.  CHG is an antiseptic cleaner which kills germs and bonds with the skin to continue killing germs even after washing. Please DO NOT use if you have an allergy to CHG or antibacterial soaps.  If your skin becomes reddened/irritated stop using the CHG and inform your nurse when you arrive at Short Stay. Do not shave (including legs and underarms) for at least 48 hours prior to the first CHG shower.  You may shave your face/neck.  Please follow these instructions carefully:  1.  Shower with CHG Soap the night before surgery and the  morning of surgery.  2.  If you choose to wash your hair, wash your hair first as usual with your normal  shampoo.  3.  After you shampoo, rinse your hair and body thoroughly to remove the shampoo.                             4.  Use CHG as you would any other liquid soap.  You can apply chg directly to the skin and wash.  Gently with a scrungie or clean washcloth.  5.  Apply the CHG Soap to your body ONLY FROM THE NECK DOWN.   Do not use on face/ open                           Wound or open sores. Avoid contact with eyes, ears mouth and genitals (private parts).                       Wash face,  Genitals (private parts) with your normal soap.             6.  Wash thoroughly, paying special attention to the area where your  surgery  will be performed.  7.  Thoroughly rinse your body with warm water from the neck down.  8.  DO NOT shower/wash with your normal soap after using and rinsing off the CHG Soap.            9.  Pat yourself dry with a clean towel.            10.  Wear clean pajamas.            11.  Place clean sheets on your bed the night of your first shower and do not  sleep with pets.  ON THE DAY OF SURGERY : Do not apply any lotions/deodorants the morning of surgery.  Please wear clean clothes to the hospital/surgery center.     FAILURE TO FOLLOW THESE INSTRUCTIONS MAY RESULT IN THE CANCELLATION OF YOUR SURGERY  PATIENT  SIGNATURE_________________________________  NURSE SIGNATURE__________________________________  ________________________________________________________________________       Nasario Exon    An incentive spirometer is a tool that can help keep your lungs clear and active. This tool measures how well you are filling your lungs with each breath. Taking long deep breaths may help reverse or decrease the chance of developing breathing (pulmonary) problems (especially infection) following: A long period of time when you are unable to move or be active. BEFORE THE PROCEDURE  If the spirometer includes an indicator to show your best effort, your nurse or respiratory therapist will set it to a desired goal. If possible, sit up straight or lean slightly forward. Try not to slouch. Hold the incentive spirometer in an upright position.  INSTRUCTIONS FOR USE  Sit on the edge of your bed if possible, or sit up as far as you can in bed or on a chair. Hold the incentive spirometer in an upright position. Breathe out normally. Place the mouthpiece in your mouth and seal your lips tightly around it. Breathe in slowly and as deeply as possible, raising the piston or the ball toward the top of the column. Hold your breath for 3-5 seconds or for as long as possible. Allow the piston or ball to fall to the bottom of the column. Remove the mouthpiece from your mouth and breathe out normally. Rest for a few seconds and repeat Steps 1 through 7 at least 10 times every 1-2 hours when you are awake. Take your time and take a few normal breaths between deep breaths. The spirometer may include an indicator to show your best effort. Use the indicator as a goal to work toward during each repetition. After each set of 10 deep breaths, practice coughing to be sure your lungs are clear. If you have an incision (the cut made at the time of surgery), support your incision when coughing by placing a pillow or rolled  up towels firmly against it. Once you are able to get out of bed, walk around indoors and cough well. You may stop using the incentive spirometer when instructed by your caregiver.  RISKS AND COMPLICATIONS Take your time so you do not get dizzy or light-headed. If you are in pain, you may need to take or ask for pain medication before doing incentive spirometry. It is harder to take a deep breath if you are having pain. AFTER USE Rest and breathe slowly and easily. It can be helpful to keep track of a log of your progress. Your caregiver can provide you with a simple table to help with this. If you are using the spirometer at home, follow these instructions: SEEK MEDICAL CARE IF:  You are having difficultly using the spirometer. You have trouble using the spirometer as often as instructed. Your pain medication is not giving enough relief while using the spirometer. You develop fever of 100.5 F (38.1 C) or higher.                                                                                                    SEEK IMMEDIATE MEDICAL CARE IF:  You cough up bloody sputum that had not been present before. You develop fever of 102 F (38.9 C) or greater. You develop worsening pain at or near the incision site. MAKE SURE YOU:  Understand these instructions. Will watch your condition. Will get help right away if you are not doing well or get worse. Document Released: 07/16/2006 Document Revised: 05/28/2011 Document Reviewed: 09/16/2006 [facility] Patient Information 2014 [place], [place].       WHAT IS A BLOOD TRANSFUSION? Blood Transfusion Information  A transfusion is the replacement of blood or some of its parts. Blood is made up of multiple cells which provide different functions. Red blood cells carry oxygen and are used for blood loss replacement. White blood  cells fight against infection. Platelets control bleeding. Plasma helps clot blood. Other blood products are available for  specialized needs, such as hemophilia or other clotting disorders. BEFORE THE TRANSFUSION  Who gives blood for transfusions?  Healthy volunteers who are fully evaluated to make sure their blood is safe. This is blood bank blood. Transfusion therapy is the safest it has ever been in the practice of medicine. Before blood is taken from a donor, a complete history is taken to make sure that person has no history of diseases nor engages in risky social behavior (examples are intravenous drug use or sexual activity with multiple partners). The donor's travel history is screened to minimize risk of transmitting infections, such as malaria. The donated blood is tested for signs of infectious diseases, such as HIV and hepatitis. The blood is then tested to be sure it is compatible with you in order to minimize the chance of a transfusion reaction. If you or a relative donates blood, this is often done in anticipation of surgery and is not appropriate for emergency situations. It takes many days to process the donated blood. RISKS AND COMPLICATIONS Although transfusion therapy is very safe and saves many lives, the main dangers of transfusion include:  Getting an infectious disease. Developing a transfusion reaction. This is an allergic reaction to something in the blood you were given. Every precaution is taken to prevent this. The decision to have a blood transfusion has been considered carefully by your caregiver before blood is given. Blood is not given unless the benefits outweigh the risks. AFTER THE TRANSFUSION Right after receiving a blood transfusion, you will usually feel much better and more energetic. This is especially true if your red blood cells have gotten low (anemic). The transfusion raises the level of the red blood cells which carry oxygen, and this usually causes an energy increase. The nurse administering the transfusion will monitor you carefully for complications. HOME CARE INSTRUCTIONS   No special instructions are needed after a transfusion. You may find your energy is better. Speak with your caregiver about any limitations on activity for underlying diseases you may have. SEEK MEDICAL CARE IF:  Your condition is not improving after your transfusion. You develop redness or irritation at the intravenous (IV) site. SEEK IMMEDIATE MEDICAL CARE IF:  Any of the following symptoms occur over the next 12 hours: Shaking chills. You have a temperature by mouth above 102 F (38.9 C), not controlled by medicine. Chest, back, or muscle pain. People around you feel you are not acting correctly or are confused. Shortness of breath or difficulty breathing. Dizziness and fainting. You get a rash or develop hives. You have a decrease in urine output. Your urine turns a dark color or changes to pink, red, or brown. Any of the following symptoms occur over the next 10 days: You have a temperature by mouth above 102 F (38.9 C), not controlled by medicine. Shortness of breath. Weakness after normal activity. The white part of the eye turns yellow (jaundice). You have a decrease in the amount of urine or are urinating less often. Your urine turns a dark color or changes to pink, red, or brown. Document Released: 03/02/2000 Document Revised: 05/28/2011 Document Reviewed: 10/20/2007 [facility] Patient Information 2014 [place], [place].  _______________________________________________________________________

## 2023-12-11 NOTE — Progress Notes (Signed)
 Office Visit Note   Patient: Samantha [name]           Date of Birth: September 03, 1940           MRN: [id number] Visit Date: 12/11/2023              Requested by: Dineen Rollene MATSU, FNP [address] [place],  [place] [address] PCP: Dineen Rollene MATSU, FNP  Chief Complaint  Patient presents with   Right Ankle - Routine Post Op    11/20/23 ORIF right ankle fracture      HPI: 83 y/o female who sustained a bimalleolar fracture is now s/p ORIF 11/20/23.  She has developed a raw area over the distal lateral incision from the ace/boot rubbing the incision. She is doing TDWB for balance and using elevation.    Assessment & Plan: Visit Diagnoses: No diagnosis found.  Plan: Vashe incision cleaning daily, wet to dry Vashe dressing over Raw lateral incision area.  When she is at rest elevate the right LE and she may sit with the boot off.  If she is moving around wear the boot.  Check skin often.  Sutures removed today.    Follow-Up Instructions: Return in about 1 week (around 12/18/2023).   Ortho Exam  Patient is alert, oriented, no adenopathy, well-dressed, normal affect, normal respiratory effort. No erythema or cellulitis.  Raw area of skin over the distal incision possible rubbing of the dressing in the cam boot.  No malodor or purulent drainage.  Palpable DP pulse.  Medial incision without skin issues or tissue loss.  Incisions were cleaned with Vashe and sutures were removed.          Imaging: No results found. No images are attached to the encounter.  Labs: Lab Results  Component Value Date   HGBA1C 6.0 04/04/2023   HGBA1C 5.9 (A) 04/27/2022   HGBA1C 6.3 12/25/2021     Lab Results  Component Value Date   ALBUMIN 2.9 (L) 11/20/2023   ALBUMIN 3.8 10/30/2023   ALBUMIN 3.8 08/26/2023    Lab Results  Component Value Date   MG 1.9 04/09/2023   Lab Results  Component Value Date   VD25OH 16.77 (L) 04/04/2023   VD25OH 30.08 12/25/2021   VD25OH 25.10 (L)  08/23/2021    No results found for: PREALBUMIN    Latest Ref Rng & Units 11/20/2023   10:30 AM 10/30/2023    2:05 PM 08/26/2023   11:29 AM  CBC EXTENDED  WBC 4.0 - 10.5 K/uL 10.7  10.2  9.7   RBC 3.87 - 5.11 MIL/uL 3.91  4.25  4.15   Hemoglobin 12.0 - 15.0 g/dL 88.3  87.2  88.0   HCT 36.0 - 46.0 % 37.0  39.0  37.1   Platelets 150 - 400 K/uL 275  263  253   NEUT# 1.7 - 7.7 K/uL 7.6  6.2  6.3   Lymph# 0.7 - 4.0 K/uL 2.0  2.8  2.5      There is no height or weight on file to calculate BMI.  Orders:  No orders of the defined types were placed in this encounter.  No orders of the defined types were placed in this encounter.    Procedures: No procedures performed  Clinical Data: No additional findings.  ROS:  All other systems negative, except as noted in the HPI. Review of Systems  Objective: Vital Signs: There were no vitals taken for this visit.  Specialty Comments:  No specialty  comments available.  PMFS History: Patient Active Problem List   Diagnosis Date Noted   Closed fracture of right ankle 11/20/2023   Abdominal pain 07/17/2023   IDA (iron  deficiency anemia) 05/27/2023   Diverticulosis of colon without hemorrhage 04/10/2023   Hematochezia 04/10/2023   Acute on chronic anemia 04/10/2023   Multifocal pneumonia 04/08/2023   GI bleed 04/08/2023   Excessive sweating 10/30/2022   Status post insertion of spinal cord stimulator 08/07/2022   Left foot pain 07/06/2022   Lumbar post-laminectomy syndrome 03/08/2022   Radicular pain of left lower extremity 03/08/2022   Prediabetes 12/25/2021   COPD GOLD 2 / still smoking  12/13/2020   Cigarette smoker 12/13/2020   B12 deficiency 10/19/2020   Tinea pedis 10/19/2020   Mucus plugging of bronchi 09/02/2020   Pulmonary artery hypertension (HCC) 09/02/2020   Coronary artery disease involving native coronary artery of native heart without angina pectoris 09/02/2020   Abnormal CT scan of lung 09/02/2020   Ground  glass opacity present on imaging of lung 09/01/2020   Atherosclerosis of aorta 08/23/2020   SOB (shortness of breath) 08/22/2020   HLD (hyperlipidemia) 06/22/2020   Hoarseness 07/25/2018   Intermittent atrial fibrillation (HCC) 05/30/2018   Lumbar foraminal stenosis 04/25/2018   HTN (hypertension) 03/26/2018   Anxiety and depression 03/26/2018   Routine physical examination 03/26/2018   Chronic low back pain 03/26/2018   Follicular lymphoma of lymph nodes of multiple regions (HCC) 01/30/2016   DDD (degenerative disc disease), lumbar 05/31/2015   Past Medical History:  Diagnosis Date   A-fib (HCC)    Anemia    hx iron  infusions   Anxiety    Arthritis    Chronic back pain    Colon cancer (HCC) 10/2023   COPD (chronic obstructive pulmonary disease) (HCC)    Coronary artery disease    COVID-19    08/22/20 likely contracted late 07/2020   Depression    GERD (gastroesophageal reflux disease)    Glaucoma    bilateral   Hearing aid worn    High cholesterol    Hypertension    Lymphadenopathy 01/25/2016   Myocardial infarction (HCC)    2000   Non Hodgkin's lymphoma (HCC)    Pneumonia 2025   x 1   Wears dentures     Top plate   Wears glasses     Family History  Problem Relation Age of Onset   Hypertension Mother    Stroke Mother    Heart disease Father    Heart disease Other     Past Surgical History:  Procedure Laterality Date   CARDIAC CATHETERIZATION     CATARACT EXTRACTION W/ INTRAOCULAR LENS IMPLANT     CHOLECYSTECTOMY     COLONOSCOPY N/A 10/10/2023   Procedure: COLONOSCOPY;  Surgeon: Unk Corinn Skiff, MD;  Location: [facility] ENDOSCOPY;  Service: Gastroenterology;  Laterality: N/A;   COLONOSCOPY W/ BIOPSIES AND POLYPECTOMY     CORONARY STENT PLACEMENT     ESOPHAGOGASTRODUODENOSCOPY N/A 10/10/2023   Procedure: EGD (ESOPHAGOGASTRODUODENOSCOPY);  Surgeon: Unk Corinn Skiff, MD;  Location[facility] ENDOSCOPY;  Service: Gastroenterology;  Laterality: N/A;   INGUINAL  LYMPH NODE BIOPSY Right 01/25/2016   Procedure: EXCISION DEEP RIGHT INGUINAL LYMPH NODE BIOPSY WITH ULTRA SOUND;  Surgeon: Elon Pacini, MD;  Location: [facility] OR;  Service: General;  Laterality: Right;   ORIF ANKLE FRACTURE Right 11/20/2023   Procedure: OPEN REDUCTION INTERNAL FIXATION (ORIF) ANKLE FRACTURE;  Surgeon: Harden Jerona GAILS, MD;  Location: [facility] OR;  Service: Orthopedics;  Laterality: Right;   POLYPECTOMY  10/10/2023   Procedure: POLYPECTOMY, INTESTINE;  Surgeon: Unk Corinn Skiff, MD;  Location: [facility] ENDOSCOPY;  Service: Gastroenterology;;   SPINAL CORD STIMULATOR IMPLANT  04/20/2022   SPINE SURGERY     Social History   Occupational History   Not on file  Tobacco Use   Smoking status: Every Day    Current packs/day: 0.50    Average packs/day: 0.5 packs/day for 60.0 years (30.0 ttl pk-yrs)    Types: Cigarettes   Smokeless tobacco: Never   Tobacco comments:    1/2 ppd--04/23/2023  Vaping Use   Vaping status: Never Used  Substance and Sexual Activity   Alcohol use: Yes    Comment: social - mixed drink   Drug use: No   Sexual activity: Not Currently    Birth control/protection: Post-menopausal

## 2023-12-12 ENCOUNTER — Encounter (HOSPITAL_COMMUNITY)
Admission: RE | Admit: 2023-12-12 | Discharge: 2023-12-12 | Disposition: A | Discharge status: Home / Self Care | Source: Ambulatory Visit | Attending: General Surgery | Admitting: General Surgery

## 2023-12-12 ENCOUNTER — Encounter (HOSPITAL_COMMUNITY): Payer: Self-pay

## 2023-12-12 ENCOUNTER — Other Ambulatory Visit: Payer: Self-pay

## 2023-12-12 VITALS — BP 138/48 | HR 55 | Temp 98.7°F | Resp 12 | Ht 69.0 in | Wt 185.0 lb

## 2023-12-12 DIAGNOSIS — Z01812 Encounter for preprocedural laboratory examination: Secondary | ICD-10-CM | POA: Diagnosis not present

## 2023-12-12 DIAGNOSIS — I2510 Atherosclerotic heart disease of native coronary artery without angina pectoris: Secondary | ICD-10-CM | POA: Insufficient documentation

## 2023-12-12 DIAGNOSIS — R7303 Prediabetes: Secondary | ICD-10-CM | POA: Diagnosis not present

## 2023-12-12 DIAGNOSIS — Z01818 Encounter for other preprocedural examination: Secondary | ICD-10-CM

## 2023-12-12 DIAGNOSIS — C8298 Follicular lymphoma, unspecified, lymph nodes of multiple sites: Secondary | ICD-10-CM | POA: Insufficient documentation

## 2023-12-12 HISTORY — DX: Prediabetes: R73.03

## 2023-12-12 LAB — COMPREHENSIVE METABOLIC PANEL WITH GFR
ALT: 7 U/L (ref 0–44)
AST: 20 U/L (ref 15–41)
Albumin: 3.6 g/dL (ref 3.5–5.0)
Alkaline Phosphatase: 85 U/L (ref 38–126)
Anion gap: 10 (ref 5–15)
BUN: 12 mg/dL (ref 8–23)
CO2: 27 mmol/L (ref 22–32)
Calcium: 9.1 mg/dL (ref 8.9–10.3)
Chloride: 102 mmol/L (ref 98–111)
Creatinine, Ser: 0.75 mg/dL (ref 0.44–1.00)
GFR, Estimated: >60 mL/min (ref >60)
Glucose, Bld: 93 mg/dL (ref 70–99)
Potassium: 4.4 mmol/L (ref 3.5–5.1)
Sodium: 139 mmol/L (ref 135–145)
Total Bilirubin: 0.4 mg/dL (ref 0.0–1.2)
Total Protein: 6.5 g/dL (ref 6.5–8.1)

## 2023-12-12 LAB — CBC
HCT: 38.7 % (ref 36.0–46.0)
Hemoglobin: 11.9 g/dL — ABNORMAL LOW (ref 12.0–15.0)
MCH: 29.8 pg (ref 26.0–34.0)
MCHC: 30.7 g/dL (ref 30.0–36.0)
MCV: 97 fL (ref 80.0–100.0)
Platelets: 270 K/uL (ref 150–400)
RBC: 3.99 MIL/uL (ref 3.87–5.11)
RDW: 14.3 % (ref 11.5–15.5)
WBC: 11.8 K/uL — ABNORMAL HIGH (ref 4.0–10.5)
nRBC: 0 % (ref 0.0–0.2)

## 2023-12-17 ENCOUNTER — Encounter: Admitting: Physician Assistant

## 2023-12-18 ENCOUNTER — Other Ambulatory Visit: Payer: Self-pay

## 2023-12-18 ENCOUNTER — Encounter (HOSPITAL_COMMUNITY)
Admission: RE | Disposition: A | Discharge status: Home / Self Care | Payer: Self-pay | Source: Home / Self Care | Attending: General Surgery

## 2023-12-18 ENCOUNTER — Inpatient Hospital Stay (HOSPITAL_COMMUNITY)
Admission: RE | Admit: 2023-12-18 | Discharge: 2023-12-20 | DRG: 330 | Disposition: A | Discharge status: Home / Self Care | Attending: General Surgery | Admitting: General Surgery

## 2023-12-18 ENCOUNTER — Inpatient Hospital Stay (HOSPITAL_COMMUNITY): Payer: Self-pay

## 2023-12-18 ENCOUNTER — Inpatient Hospital Stay (HOSPITAL_COMMUNITY): Payer: Self-pay | Admitting: Physician Assistant

## 2023-12-18 ENCOUNTER — Encounter (HOSPITAL_COMMUNITY): Payer: Self-pay | Admitting: General Surgery

## 2023-12-18 DIAGNOSIS — Z885 Allergy status to narcotic agent status: Secondary | ICD-10-CM | POA: Diagnosis not present

## 2023-12-18 DIAGNOSIS — C184 Malignant neoplasm of transverse colon: Secondary | ICD-10-CM

## 2023-12-18 DIAGNOSIS — Z8673 Personal history of transient ischemic attack (TIA), and cerebral infarction without residual deficits: Secondary | ICD-10-CM

## 2023-12-18 DIAGNOSIS — I2510 Atherosclerotic heart disease of native coronary artery without angina pectoris: Secondary | ICD-10-CM

## 2023-12-18 DIAGNOSIS — R7303 Prediabetes: Secondary | ICD-10-CM | POA: Diagnosis present

## 2023-12-18 DIAGNOSIS — Z8249 Family history of ischemic heart disease and other diseases of the circulatory system: Secondary | ICD-10-CM

## 2023-12-18 DIAGNOSIS — F17210 Nicotine dependence, cigarettes, uncomplicated: Secondary | ICD-10-CM

## 2023-12-18 DIAGNOSIS — I10 Essential (primary) hypertension: Secondary | ICD-10-CM | POA: Diagnosis present

## 2023-12-18 DIAGNOSIS — C829A Follicular lymphoma, unspecified, in remission: Secondary | ICD-10-CM | POA: Diagnosis not present

## 2023-12-18 DIAGNOSIS — Z8619 Personal history of other infectious and parasitic diseases: Secondary | ICD-10-CM | POA: Diagnosis not present

## 2023-12-18 DIAGNOSIS — K219 Gastro-esophageal reflux disease without esophagitis: Secondary | ICD-10-CM | POA: Diagnosis not present

## 2023-12-18 DIAGNOSIS — Z823 Family history of stroke: Secondary | ICD-10-CM | POA: Diagnosis not present

## 2023-12-18 DIAGNOSIS — E7800 Pure hypercholesterolemia, unspecified: Secondary | ICD-10-CM | POA: Diagnosis present

## 2023-12-18 DIAGNOSIS — D509 Iron deficiency anemia, unspecified: Secondary | ICD-10-CM | POA: Diagnosis not present

## 2023-12-18 DIAGNOSIS — I252 Old myocardial infarction: Secondary | ICD-10-CM | POA: Diagnosis not present

## 2023-12-18 DIAGNOSIS — C189 Malignant neoplasm of colon, unspecified: Secondary | ICD-10-CM | POA: Diagnosis not present

## 2023-12-18 DIAGNOSIS — Z7901 Long term (current) use of anticoagulants: Secondary | ICD-10-CM

## 2023-12-18 DIAGNOSIS — Z79899 Other long term (current) drug therapy: Secondary | ICD-10-CM | POA: Diagnosis not present

## 2023-12-18 DIAGNOSIS — J4489 Other specified chronic obstructive pulmonary disease: Secondary | ICD-10-CM | POA: Diagnosis present

## 2023-12-18 DIAGNOSIS — E039 Hypothyroidism, unspecified: Secondary | ICD-10-CM | POA: Diagnosis present

## 2023-12-18 LAB — TYPE AND SCREEN
ABO/RH(D): A POS
Antibody Screen: NEGATIVE

## 2023-12-18 SURGERY — COLECTOMY, PARTIAL, ROBOT-ASSISTED, LAPAROSCOPIC
Anesthesia: General

## 2023-12-18 MED ORDER — TRAMADOL HCL 50 MG PO TABS
50.0000 mg | ORAL_TABLET | Freq: Four times a day (QID) | ORAL | Status: DC | PRN
  Administered 2023-12-18 – 2023-12-19 (×2): 50 mg via ORAL
  Filled 2023-12-18 (×3): qty 1

## 2023-12-18 MED ORDER — ATORVASTATIN CALCIUM 20 MG PO TABS
40.0000 mg | ORAL_TABLET | Freq: Every day | ORAL | Status: DC
  Administered 2023-12-19 – 2023-12-20 (×2): 40 mg via ORAL
  Filled 2023-12-18 (×2): qty 2

## 2023-12-18 MED ORDER — PROPOFOL 10 MG/ML IV BOLUS
INTRAVENOUS | Status: DC | PRN
  Administered 2023-12-18: 100 mg via INTRAVENOUS

## 2023-12-18 MED ORDER — DIPHENHYDRAMINE HCL 12.5 MG/5ML PO ELIX: 12.5000 mg | ORAL_SOLUTION | Freq: Four times a day (QID) | ORAL | Status: DC | PRN

## 2023-12-18 MED ORDER — ONDANSETRON HCL 4 MG/2ML IJ SOLN
INTRAMUSCULAR | Status: AC
  Filled 2023-12-18: qty 2

## 2023-12-18 MED ORDER — LIDOCAINE 2% (20 MG/ML) 5 ML SYRINGE
INTRAMUSCULAR | Status: DC | PRN
  Administered 2023-12-18: 60 mg via INTRAVENOUS

## 2023-12-18 MED ORDER — GABAPENTIN 100 MG PO CAPS
200.0000 mg | ORAL_CAPSULE | Freq: Three times a day (TID) | ORAL | Status: DC
  Administered 2023-12-18 – 2023-12-20 (×6): 200 mg via ORAL
  Filled 2023-12-18 (×6): qty 2

## 2023-12-18 MED ORDER — ALUM & MAG HYDROXIDE-SIMETH 200-200-20 MG/5ML PO SUSP: 30.0000 mL | Freq: Four times a day (QID) | ORAL | Status: DC | PRN

## 2023-12-18 MED ORDER — SUGAMMADEX SODIUM 200 MG/2ML IV SOLN
INTRAVENOUS | Status: AC
  Filled 2023-12-18: qty 2

## 2023-12-18 MED ORDER — SUGAMMADEX SODIUM 200 MG/2ML IV SOLN
INTRAVENOUS | Status: DC | PRN
  Administered 2023-12-18: 200 mg via INTRAVENOUS

## 2023-12-18 MED ORDER — ALVIMOPAN 12 MG PO CAPS
12.0000 mg | ORAL_CAPSULE | ORAL | Status: AC
  Administered 2023-12-18: 12 mg via ORAL
  Filled 2023-12-18: qty 1

## 2023-12-18 MED ORDER — DIPHENHYDRAMINE HCL 50 MG/ML IJ SOLN: 12.5000 mg | Freq: Four times a day (QID) | INTRAMUSCULAR | Status: DC | PRN

## 2023-12-18 MED ORDER — PANTOPRAZOLE SODIUM 40 MG PO TBEC
40.0000 mg | DELAYED_RELEASE_TABLET | Freq: Every day | ORAL | Status: DC
  Administered 2023-12-19 – 2023-12-20 (×2): 40 mg via ORAL
  Filled 2023-12-18 (×2): qty 1

## 2023-12-18 MED ORDER — ONDANSETRON HCL 4 MG/2ML IJ SOLN
INTRAMUSCULAR | Status: DC | PRN
  Administered 2023-12-18: 4 mg via INTRAVENOUS

## 2023-12-18 MED ORDER — FENTANYL CITRATE PF 50 MCG/ML IJ SOSY
25.0000 ug | PREFILLED_SYRINGE | INTRAMUSCULAR | Status: DC | PRN
  Administered 2023-12-18 (×2): 50 ug via INTRAVENOUS

## 2023-12-18 MED ORDER — SIMETHICONE 80 MG PO CHEW
40.0000 mg | CHEWABLE_TABLET | Freq: Four times a day (QID) | ORAL | Status: DC | PRN
  Administered 2023-12-19: 40 mg via ORAL
  Filled 2023-12-18: qty 1

## 2023-12-18 MED ORDER — ALPRAZOLAM 0.25 MG PO TABS: 0.2500 mg | ORAL_TABLET | Freq: Every day | ORAL | Status: DC | PRN

## 2023-12-18 MED ORDER — GABAPENTIN 300 MG PO CAPS
300.0000 mg | ORAL_CAPSULE | ORAL | Status: DC
Start: 2023-12-18 — End: 2023-12-18
  Filled 2023-12-18: qty 1

## 2023-12-18 MED ORDER — ROCURONIUM BROMIDE 10 MG/ML (PF) SYRINGE
PREFILLED_SYRINGE | INTRAVENOUS | Status: DC | PRN
  Administered 2023-12-18: 10 mg via INTRAVENOUS
  Administered 2023-12-18: 20 mg via INTRAVENOUS
  Administered 2023-12-18: 60 mg via INTRAVENOUS

## 2023-12-18 MED ORDER — ROCURONIUM BROMIDE 10 MG/ML (PF) SYRINGE
PREFILLED_SYRINGE | INTRAVENOUS | Status: AC
  Filled 2023-12-18: qty 10

## 2023-12-18 MED ORDER — ENSURE PRE-SURGERY PO LIQD
592.0000 mL | Freq: Once | ORAL | Status: DC
  Filled 2023-12-18: qty 592

## 2023-12-18 MED ORDER — LACTATED RINGERS IV SOLN
INTRAVENOUS | Status: DC
Start: 2023-12-18 — End: 2023-12-18

## 2023-12-18 MED ORDER — PROPOFOL 500 MG/50ML IV EMUL
INTRAVENOUS | Status: DC | PRN
  Administered 2023-12-18: 100 ug/kg/min via INTRAVENOUS
  Administered 2023-12-18: 50 ug/kg/min via INTRAVENOUS

## 2023-12-18 MED ORDER — INDOCYANINE GREEN 25 MG IV SOLR
INTRAVENOUS | Status: DC | PRN
  Administered 2023-12-18: 2.5 mg via INTRAVENOUS

## 2023-12-18 MED ORDER — ACETAMINOPHEN 500 MG PO TABS
1000.0000 mg | ORAL_TABLET | Freq: Four times a day (QID) | ORAL | Status: DC
  Administered 2023-12-18 – 2023-12-19 (×5): 1000 mg via ORAL
  Filled 2023-12-18 (×6): qty 2

## 2023-12-18 MED ORDER — EPHEDRINE 5 MG/ML INJ
INTRAVENOUS | Status: AC
  Filled 2023-12-18: qty 5

## 2023-12-18 MED ORDER — FLUTICASONE FUROATE-VILANTEROL 100-25 MCG/ACT IN AEPB
1.0000 | INHALATION_SPRAY | Freq: Every day | RESPIRATORY_TRACT | Status: DC
  Administered 2023-12-19 – 2023-12-20 (×3): 1 via RESPIRATORY_TRACT
  Filled 2023-12-18: qty 28

## 2023-12-18 MED ORDER — ALVIMOPAN 12 MG PO CAPS
12.0000 mg | ORAL_CAPSULE | Freq: Two times a day (BID) | ORAL | Status: DC
  Administered 2023-12-19: 12 mg via ORAL
  Filled 2023-12-18: qty 1

## 2023-12-18 MED ORDER — SODIUM CHLORIDE 0.9 % IV SOLN
2.0000 g | Freq: Two times a day (BID) | INTRAVENOUS | Status: AC
  Administered 2023-12-18: 2 g via INTRAVENOUS
  Filled 2023-12-18 (×2): qty 2

## 2023-12-18 MED ORDER — SACCHAROMYCES BOULARDII 250 MG PO CAPS
250.0000 mg | ORAL_CAPSULE | Freq: Two times a day (BID) | ORAL | Status: DC
Start: 2023-12-18 — End: 2023-12-20
  Administered 2023-12-18 – 2023-12-20 (×4): 250 mg via ORAL
  Filled 2023-12-18 (×5): qty 1

## 2023-12-18 MED ORDER — PROPOFOL 10 MG/ML IV BOLUS
INTRAVENOUS | Status: AC
  Filled 2023-12-18: qty 20

## 2023-12-18 MED ORDER — FENTANYL CITRATE PF 50 MCG/ML IJ SOSY
PREFILLED_SYRINGE | INTRAMUSCULAR | Status: AC
  Filled 2023-12-18: qty 1

## 2023-12-18 MED ORDER — BISACODYL 5 MG PO TBEC: 20.0000 mg | DELAYED_RELEASE_TABLET | Freq: Once | ORAL | Status: DC

## 2023-12-18 MED ORDER — ONDANSETRON HCL 4 MG/2ML IJ SOLN: 4.0000 mg | Freq: Four times a day (QID) | INTRAMUSCULAR | Status: DC | PRN

## 2023-12-18 MED ORDER — EPHEDRINE SULFATE-NACL 50-0.9 MG/10ML-% IV SOSY
PREFILLED_SYRINGE | INTRAVENOUS | Status: DC | PRN
Start: 2023-12-18 — End: 2023-12-18
  Administered 2023-12-18 (×4): 5 mg via INTRAVENOUS

## 2023-12-18 MED ORDER — NITROGLYCERIN 0.4 MG SL SUBL: 0.4000 mg | SUBLINGUAL_TABLET | SUBLINGUAL | Status: DC | PRN

## 2023-12-18 MED ORDER — FENTANYL CITRATE (PF) 100 MCG/2ML IJ SOLN
INTRAMUSCULAR | Status: AC
  Filled 2023-12-18: qty 2

## 2023-12-18 MED ORDER — ALBUTEROL SULFATE (2.5 MG/3ML) 0.083% IN NEBU: 3.0000 mL | INHALATION_SOLUTION | Freq: Four times a day (QID) | RESPIRATORY_TRACT | Status: DC | PRN

## 2023-12-18 MED ORDER — ENOXAPARIN SODIUM 40 MG/0.4ML IJ SOSY
40.0000 mg | PREFILLED_SYRINGE | INTRAMUSCULAR | Status: DC
  Administered 2023-12-19 – 2023-12-20 (×2): 40 mg via SUBCUTANEOUS
  Filled 2023-12-18 (×2): qty 0.4

## 2023-12-18 MED ORDER — HEPARIN SODIUM (PORCINE) 5000 UNIT/ML IJ SOLN
5000.0000 [IU] | Freq: Once | INTRAMUSCULAR | Status: AC
Start: 2023-12-18 — End: 2023-12-18
  Administered 2023-12-18: 5000 [IU] via SUBCUTANEOUS
  Filled 2023-12-18: qty 1

## 2023-12-18 MED ORDER — ENSURE SURGERY PO LIQD
237.0000 mL | Freq: Two times a day (BID) | ORAL | Status: DC
  Administered 2023-12-19 (×2): 237 mL via ORAL

## 2023-12-18 MED ORDER — KCL IN DEXTROSE-NACL 20-5-0.45 MEQ/L-%-% IV SOLN
INTRAVENOUS | Status: DC
  Filled 2023-12-18: qty 1000

## 2023-12-18 MED ORDER — LIDOCAINE HCL (PF) 2 % IJ SOLN
INTRAMUSCULAR | Status: AC
Start: 2023-12-18 — End: 2023-12-18
  Filled 2023-12-18: qty 5

## 2023-12-18 MED ORDER — METOPROLOL SUCCINATE ER 50 MG PO TB24
50.0000 mg | ORAL_TABLET | Freq: Every day | ORAL | Status: DC
  Administered 2023-12-19 – 2023-12-20 (×2): 50 mg via ORAL
  Filled 2023-12-18 (×2): qty 1

## 2023-12-18 MED ORDER — AMIODARONE HCL 100 MG PO TABS
100.0000 mg | ORAL_TABLET | Freq: Every day | ORAL | Status: DC
  Administered 2023-12-19 – 2023-12-20 (×2): 100 mg via ORAL
  Filled 2023-12-18 (×2): qty 1

## 2023-12-18 MED ORDER — ONDANSETRON HCL 4 MG PO TABS
4.0000 mg | ORAL_TABLET | Freq: Four times a day (QID) | ORAL | Status: DC | PRN
  Administered 2023-12-19: 4 mg via ORAL
  Filled 2023-12-18: qty 1

## 2023-12-18 MED ORDER — ENSURE PRE-SURGERY PO LIQD
296.0000 mL | Freq: Once | ORAL | Status: DC
  Filled 2023-12-18: qty 296

## 2023-12-18 MED ORDER — BUPIVACAINE-EPINEPHRINE 0.25% -1:200000 IJ SOLN
INTRAMUSCULAR | Status: DC | PRN
  Administered 2023-12-18: 60 mL

## 2023-12-18 MED ORDER — METOPROLOL TARTRATE 5 MG/5ML IV SOLN: 5.0000 mg | Freq: Four times a day (QID) | INTRAVENOUS | Status: DC | PRN

## 2023-12-18 MED ORDER — BUPIVACAINE-EPINEPHRINE (PF) 0.25% -1:200000 IJ SOLN
INTRAMUSCULAR | Status: AC
  Filled 2023-12-18: qty 60

## 2023-12-18 MED ORDER — ONDANSETRON HCL 4 MG/2ML IJ SOLN: 4.0000 mg | Freq: Once | INTRAMUSCULAR | Status: DC | PRN

## 2023-12-18 MED ORDER — FENTANYL CITRATE (PF) 100 MCG/2ML IJ SOLN
INTRAMUSCULAR | Status: DC | PRN
  Administered 2023-12-18: 25 ug via INTRAVENOUS
  Administered 2023-12-18: 50 ug via INTRAVENOUS

## 2023-12-18 MED ORDER — 0.9 % SODIUM CHLORIDE (POUR BTL) OPTIME
TOPICAL | Status: DC | PRN
  Administered 2023-12-18: 2000 mL

## 2023-12-18 MED ORDER — PAROXETINE HCL 10 MG PO TABS
10.0000 mg | ORAL_TABLET | Freq: Every day | ORAL | Status: DC
  Administered 2023-12-19 – 2023-12-20 (×2): 10 mg via ORAL
  Filled 2023-12-18 (×2): qty 1

## 2023-12-18 MED ORDER — CHLORHEXIDINE GLUCONATE 0.12 % MT SOLN
15.0000 mL | Freq: Once | OROMUCOSAL | Status: AC
  Administered 2023-12-18: 15 mL via OROMUCOSAL

## 2023-12-18 MED ORDER — ORAL CARE MOUTH RINSE: 15.0000 mL | Freq: Once | OROMUCOSAL | Status: AC

## 2023-12-18 MED ORDER — PHENYLEPHRINE HCL-NACL 20-0.9 MG/250ML-% IV SOLN
INTRAVENOUS | Status: AC
  Filled 2023-12-18: qty 250

## 2023-12-18 MED ORDER — POLYETHYLENE GLYCOL 3350 17 GM/SCOOP PO POWD
238.0000 g | Freq: Once | ORAL | Status: DC
Start: 2023-12-18 — End: 2023-12-18

## 2023-12-18 MED ORDER — AMLODIPINE BESYLATE 5 MG PO TABS
2.5000 mg | ORAL_TABLET | Freq: Every day | ORAL | Status: DC
  Administered 2023-12-19 – 2023-12-20 (×2): 2.5 mg via ORAL
  Filled 2023-12-18 (×2): qty 1

## 2023-12-18 MED ORDER — PHENYLEPHRINE 80 MCG/ML (10ML) SYRINGE FOR IV PUSH (FOR BLOOD PRESSURE SUPPORT)
PREFILLED_SYRINGE | INTRAVENOUS | Status: AC
  Filled 2023-12-18: qty 20

## 2023-12-18 MED ORDER — ACETAMINOPHEN 500 MG PO TABS
1000.0000 mg | ORAL_TABLET | ORAL | Status: AC
  Administered 2023-12-18: 1000 mg via ORAL
  Filled 2023-12-18: qty 2

## 2023-12-18 MED ORDER — RINGERS IRRIGATION IR SOLN
Status: DC | PRN
  Administered 2023-12-18: 1000 mL

## 2023-12-18 MED ORDER — SODIUM CHLORIDE 0.9 % IV SOLN
2.0000 g | INTRAVENOUS | Status: AC
  Administered 2023-12-18: 2 g via INTRAVENOUS
  Filled 2023-12-18: qty 2

## 2023-12-18 MED ORDER — HYDROMORPHONE HCL 1 MG/ML IJ SOLN
0.5000 mg | INTRAMUSCULAR | Status: DC | PRN
  Administered 2023-12-18: 0.5 mg via INTRAVENOUS
  Filled 2023-12-18: qty 0.5

## 2023-12-18 SURGICAL SUPPLY — 67 items
BAG COUNTER SPONGE SURGICOUNT (BAG) ×1 IMPLANT
BLADE EXTENDED COATED 6.5IN (ELECTRODE) IMPLANT
CANNULA REDUCER 12-8 DVNC XI (CANNULA) IMPLANT
CELLS DAT CNTRL 66122 CELL SVR (MISCELLANEOUS) IMPLANT
COVER SURGICAL LIGHT HANDLE (MISCELLANEOUS) ×2 IMPLANT
COVER TIP SHEARS 8 DVNC (MISCELLANEOUS) ×1 IMPLANT
DEFOGGER SCOPE WARM SEASHARP (MISCELLANEOUS) ×1 IMPLANT
DERMABOND ADVANCED .7 DNX6 (GAUZE/BANDAGES/DRESSINGS) IMPLANT
DRAIN CHANNEL 19F RND (DRAIN) IMPLANT
DRAPE ARM DVNC X/XI (DISPOSABLE) ×4 IMPLANT
DRAPE COLUMN DVNC XI (DISPOSABLE) ×1 IMPLANT
DRAPE CV SPLIT W-CLR ANES SCRN (DRAPES) ×1 IMPLANT
DRAPE PERI GROIN 82X75IN TIB (DRAPES) ×1 IMPLANT
DRAPE SURG IRRIG POUCH 19X23 (DRAPES) ×1 IMPLANT
DRIVER NDL LRG 8 DVNC XI (INSTRUMENTS) ×1 IMPLANT
DRIVER NDLE LRG 8 DVNC XI (INSTRUMENTS) ×2 IMPLANT
DRSG OPSITE POSTOP 4X6 (GAUZE/BANDAGES/DRESSINGS) IMPLANT
DRSG OPSITE POSTOP 4X8 (GAUZE/BANDAGES/DRESSINGS) IMPLANT
ELECT PENCIL ROCKER SW 15FT (MISCELLANEOUS) ×1 IMPLANT
ELECT REM PT RETURN 15FT ADLT (MISCELLANEOUS) ×1 IMPLANT
EVACUATOR SILICONE 100CC (DRAIN) IMPLANT
GLOVE BIO SURGEON STRL SZ 6.5 (GLOVE) ×3 IMPLANT
GLOVE INDICATOR 6.5 STRL GRN (GLOVE) ×3 IMPLANT
GOWN SRG XL LVL 4 BRTHBL STRL (GOWNS) ×1 IMPLANT
GOWN STRL REUS W/ TWL XL LVL3 (GOWN DISPOSABLE) ×3 IMPLANT
GRASPER SUT TROCAR 14GX15 (MISCELLANEOUS) IMPLANT
GRASPER TIP-UP FEN DVNC XI (INSTRUMENTS) ×1 IMPLANT
HOLDER FOLEY CATH W/STRAP (MISCELLANEOUS) ×1 IMPLANT
IRRIGATION SUCT STRKRFLW 2 WTP (MISCELLANEOUS) ×1 IMPLANT
KIT PROCEDURE DVNC SI (MISCELLANEOUS) IMPLANT
KIT TURNOVER KIT A (KITS) ×1 IMPLANT
NDL INSUFFLATION 14GA 120MM (NEEDLE) ×1 IMPLANT
NEEDLE INSUFFLATION 14GA 120MM (NEEDLE) ×1 IMPLANT
PACK COLON (CUSTOM PROCEDURE TRAY) ×1 IMPLANT
PAD POSITIONING PINK XL (MISCELLANEOUS) ×1 IMPLANT
RELOAD STAPLE 60 3.5 BLU DVNC (STAPLE) IMPLANT
RELOAD STAPLE 60 4.3 GRN DVNC (STAPLE) IMPLANT
RETRACTOR WND ALEXIS 18 MED (MISCELLANEOUS) IMPLANT
SCISSORS LAP 5X35 DISP (ENDOMECHANICALS) IMPLANT
SCISSORS MNPLR CVD DVNC XI (INSTRUMENTS) ×1 IMPLANT
SEAL UNIV 5-12 XI (MISCELLANEOUS) ×3 IMPLANT
SEALER VESSEL EXT DVNC XI (MISCELLANEOUS) ×1 IMPLANT
SOLUTION ELECTROSURG ANTI STCK (MISCELLANEOUS) ×1 IMPLANT
SPIKE FLUID TRANSFER (MISCELLANEOUS) IMPLANT
STAPLER 60 SUREFORM DVNC (STAPLE) IMPLANT
STAPLER ECHELON POWER CIR 29 (STAPLE) IMPLANT
STAPLER ECHELON POWER CIR 31 (STAPLE) IMPLANT
SUT ETHILON 2 0 PS N (SUTURE) IMPLANT
SUT NOVA NAB GS-21 1 T12 (SUTURE) ×2 IMPLANT
SUT PROLENE 2 0 KS (SUTURE) IMPLANT
SUT SILK 2 0 SH CR/8 (SUTURE) IMPLANT
SUT SILK 2-0 18XBRD TIE 12 (SUTURE) ×1 IMPLANT
SUT SILK 3 0 SH CR/8 (SUTURE) ×1 IMPLANT
SUT SILK 3-0 18XBRD TIE 12 (SUTURE) IMPLANT
SUT VIC AB 2-0 SH 18 (SUTURE) IMPLANT
SUT VIC AB 2-0 SH 27X BRD (SUTURE) IMPLANT
SUT VIC AB 3-0 SH 18 (SUTURE) IMPLANT
SUT VIC AB 4-0 PS2 27 (SUTURE) ×2 IMPLANT
SUT VICRYL 0 UR6 27IN ABS (SUTURE) ×1 IMPLANT
SUTURE V-LC BRB 180 2/0GR6GS22 (SUTURE) IMPLANT
SYR 20ML ECCENTRIC (SYRINGE) ×1 IMPLANT
SYSTEM WOUND ALEXIS 18CM MED (MISCELLANEOUS) IMPLANT
TOWEL OR 17X26 10 PK STRL BLUE (TOWEL DISPOSABLE) IMPLANT
TRAY FOLEY MTR SLVR 16FR STAT (SET/KITS/TRAYS/PACK) ×1 IMPLANT
TROCAR ADV FIXATION 5X100MM (TROCAR) ×1 IMPLANT
TUBING CONNECTING 10 (TUBING) ×2 IMPLANT
TUBING INSUFFLATION 10FT LAP (TUBING) ×1 IMPLANT

## 2023-12-18 NOTE — Interval H&P Note (Signed)
 History and Physical Interval Note:  12/18/2023 11:43 AM  Samantha [name]  has presented today for surgery, with the diagnosis of COLON CANCER.  The various methods of treatment have been discussed with the patient and family. After consideration of risks, benefits and other options for treatment, the patient has consented to  Procedure(s) with comments: COLECTOMY, PARTIAL, ROBOT-ASSISTED, LAPAROSCOPIC (N/A) - ROBOTIC PARTIAL COLECTOMY as a surgical intervention.  The patient's history has been reviewed, patient examined, no change in status, stable for surgery.  I have reviewed the patient's chart and labs.  Questions were answered to the patient's satisfaction.     Bernarda JAYSON Ned, MD  Colorectal and General Surgery [facility]

## 2023-12-18 NOTE — Anesthesia Procedure Notes (Signed)
 Procedure Name: Intubation Date/Time: 12/18/2023 1:26 PM  Performed by: Gladis Honey, CRNAPre-anesthesia Checklist: Patient identified, Emergency Drugs available, Suction available and Patient being monitored Patient Re-evaluated:Patient Re-evaluated prior to induction Oxygen Delivery Method: Circle System Utilized Preoxygenation: Pre-oxygenation with 100% oxygen Induction Type: IV induction Ventilation: Mask ventilation without difficulty Laryngoscope Size: Miller and 2 Grade View: Grade I Tube type: Oral Tube size: 7.0 mm Number of attempts: 1 Airway Equipment and Method: Stylet and Oral airway Placement Confirmation: ETT inserted through vocal cords under direct vision, positive ETCO2 and breath sounds checked- equal and bilateral Secured at: 21 cm Tube secured with: Tape Dental Injury: Teeth and Oropharynx as per pre-operative assessment

## 2023-12-18 NOTE — Op Note (Signed)
 12/18/2023  3:04 PM  PATIENT:  Samantha [name]  83 y.o. female  Patient Care Team: Dineen Rollene MATSU, FNP as PCP - General (Family Medicine) Perla, Evalene PARAS, MD as PCP - Cardiology (Cardiology)  PRE-OPERATIVE DIAGNOSIS:  COLON CANCER  POST-OPERATIVE DIAGNOSIS:  TRANSVERSE COLON CANCER  PROCEDURE:  Procedure(s): COLECTOMY, PARTIAL, ROBOT-ASSISTED, LAPAROSCOPIC  SURGEON:  Surgeon(s): Debby Hila, MD Sheldon Standing, MD  ASSISTANT: Dr Sheldon   ANESTHESIA:   local and general  EBL: 25ml Total I/O In: -  Out: 200 [Urine:200]  Delay start of Pharmacological VTE agent (>24hrs) due to surgical blood loss or risk of bleeding:  no  DRAINS: none   SPECIMEN:  Source of Specimen:  ascending and transverse colon  DISPOSITION OF SPECIMEN:  PATHOLOGY  COUNTS:  YES  PLAN OF CARE: Admit to inpatient   PATIENT DISPOSITION:  PACU - hemodynamically stable.  INDICATION:    83 y.o. F with newly diagnosed colon cancer.  I recommended segmental resection:  The anatomy & physiology of the digestive tract was discussed.  The pathophysiology was discussed.  Natural history risks without surgery was discussed.   I worked to give an overview of the disease and the frequent need to have multispecialty involvement.  I feel the risks of no intervention will lead to serious problems that outweigh the operative risks; therefore, I recommended a partial colectomy to remove the pathology.  Laparoscopic & open techniques were discussed.   Risks such as bleeding, infection, abscess, leak, reoperation, possible ostomy, hernia, heart attack, death, and other risks were discussed.  I noted a good likelihood this will help address the problem.   Goals of post-operative recovery were discussed as well.    The patient expressed understanding & wished to proceed with surgery.  OR FINDINGS:   Patient had mass just proximal to the mid transverse colon.  No obvious metastatic disease on visceral parietal  peritoneum or liver.  DESCRIPTION:   Informed consent was confirmed.  The patient underwent general anaesthesia without difficulty.  The patient was positioned appropriately.  VTE prevention in place.  The patient's abdomen was clipped, prepped, & draped in a sterile fashion.  Surgical timeout confirmed our plan.  The patient was positioned in reverse Trendelenburg.  Abdominal entry was gained using a Varies needle in the LUQ.  Entry was clean.  I induced carbon dioxide insufflation.  An 8mm robotic port was placed in the RLQ.  Camera inspection revealed no injury.  Extra ports were carefully placed under direct laparoscopic visualization.  I laparoscopically reflected the greater omentum and the upper abdomen the small bowel in the peilvis. I identified the tumor in the mid transverse colon just proximal to the midline.  I decided to perform an extended right colectomy. The patient was appropriately positioned and the robot was docked to the patient's left side.  Instruments were placed under direct visualization.    I began by identifying the ileocolic artery and vein within the mesentery. Dissection was bluntly carried around these structures. The duodenum was identified and free from the structures. I then separated the structures bluntly and used the robotic vessel sealer device to transect these.  I developed the retroperitoneal plane bluntly.  I then freed the appendix off its attachments to the pelvic wall. I mobilized the terminal ileum.  I took care to avoid injuring any retroperitoneal structures.  After this I began to mobilize laterally down the white line of Toldt and then took down the hepatic flexure using the vessel  sealer device. I mobilized the omentum off of the transverse colon. The entire colon was then flipped medially and mobilized off of the retroperitoneal structures until I could visualize the lateral edge of the duodenum underneath.  I gently freed the duodenal attachments. I  continued my dissection to the level of the middle colic, which was also taken down with the vessel sealer.    I identified a portion of mesentery of the transverse colon just distal to the tumor.  I divided up to the colon from the previous dissection of the mesentery using the robotic vessel sealer.  I then divided the terminal ileal mesentery in similar fashion.  At that point, the terminal ileum was divided with a blue load robotic 60 mm stapler.  The transverse colon was divided with a green load robotic stapler.  The specimen was then completely free and placed in the right upper quadrant.  Hemostasis was good.  I then oriented the remaining terminal ileum and transverse colon and a isoperistaltic fashion.  I placed an enterotomy in the small bowel and colon using the robotic scissors.  I then introduced a white load 60 mm robotic stapler into both enterotomies and created an anastomosis between the small bowel and transverse colon.  Hemostasis within the staple line was good. The common enterotomy channel was closed using 2 running 2-0 V-Loc sutures.  The abdomen was then irrigated with normal saline. The omentum was then brought down over the anastomosis.  At this point the robot was undocked.  The 12 mm suprapubic port was enlarged to a small Pfannenstiel incision and an Alexis wound protector was placed.  The specimen was removed from the abdomen and evaluated.  Once the abdomen was inspected for hemostasis, the Alexis wound protector was removed.    The peritoneum of the Pfannenstiel incision was closed using a running 0 Vicryl suture.  The fascia was then closed using 2 running #1 Novafil sutures.  The subcutaneous tissue of the extraction incision was closed using a running 2-0 Vicryl suture. The skin was then closed using running subcuticular 4-0 Vicryl sutures.  All surgical sites were closed using interrupted 4-0 Vicryl sutures and Dermabond. All counts were correct per operating room staff. The  patient was then awakened from anesthesia and sent to the post anesthesia care unit in stable condition.     Bernarda JAYSON Ned, MD  Colorectal and General Surgery [facility]

## 2023-12-18 NOTE — Anesthesia Postprocedure Evaluation (Signed)
 Anesthesia Post Note  Patient: Samantha [name]  Procedure(s) Performed: COLECTOMY, PARTIAL, ROBOT-ASSISTED, LAPAROSCOPIC. RIGHT COLON AND EXTENDING TRANSVERSE COLON RESECTION     Patient location during evaluation: PACU Anesthesia Type: General Level of consciousness: awake and alert Pain management: pain level controlled Vital Signs Assessment: post-procedure vital signs reviewed and stable Respiratory status: spontaneous breathing, nonlabored ventilation, respiratory function stable and patient connected to nasal cannula oxygen Cardiovascular status: blood pressure returned to baseline and stable Postop Assessment: no apparent nausea or vomiting Anesthetic complications: no   No notable events documented.  Last Vitals:  Vitals:   12/18/23 1545 12/18/23 1600  BP: (!) 177/55 (!) 183/58  Pulse: (!) 56 (!) 57  Resp: 17 14  Temp:    SpO2: 95% 91%    Last Pain:  Vitals:   12/18/23 1602  TempSrc:   PainSc: 5                  Debby FORBES Like

## 2023-12-18 NOTE — Plan of Care (Signed)
  Problem: Education: Goal: Understanding of discharge needs will improve Outcome: Progressing Goal: Verbalization of understanding of the causes of altered bowel function will improve Outcome: Progressing   Problem: Activity: Goal: Ability to tolerate increased activity will improve Outcome: Progressing

## 2023-12-18 NOTE — Transfer of Care (Signed)
 Immediate Anesthesia Transfer of Care Note  Patient: Samantha [name]  Procedure(s) Performed: COLECTOMY, PARTIAL, ROBOT-ASSISTED, LAPAROSCOPIC. RIGHT COLON AND EXTENDING TRANSVERSE COLON RESECTION  Patient Location: PACU  Anesthesia Type:General  Level of Consciousness: awake, oriented, and drowsy  Airway & Oxygen Therapy: Patient Spontanous Breathing and Patient connected to nasal cannula oxygen  Post-op Assessment: Report given to RN and Post -op Vital signs reviewed and stable  Post vital signs: Reviewed and stable  Last Vitals:  Vitals Value Taken Time  BP 191/54 12/18/23 15:23  Temp    Pulse 55 12/18/23 15:29  Resp 20 12/18/23 15:29  SpO2 100 % 12/18/23 15:29  Vitals shown include unfiled device data.  Last Pain:  Vitals:   12/18/23 1145  TempSrc: Oral         Complications: No notable events documented.

## 2023-12-18 NOTE — Anesthesia Preprocedure Evaluation (Addendum)
 Anesthesia Evaluation  Patient identified by MRN, date of birth, ID band Patient awake    Reviewed: Allergy & Precautions, NPO status , Patient's Chart, lab work & pertinent test results, reviewed documented beta blocker date and time   History of Anesthesia Complications Negative for: history of anesthetic complications  Airway Mallampati: II  TM Distance: >3 FB Neck ROM: Full    Dental  (+) Partial Upper, Dental Advisory Given   Pulmonary COPD,  COPD inhaler, Current Smoker and Patient abstained from smoking.   Pulmonary exam normal        Cardiovascular hypertension, Pt. on medications and Pt. on home beta blockers + CAD and + Past MI  Normal cardiovascular exam+ dysrhythmias Atrial Fibrillation      Neuro/Psych  PSYCHIATRIC DISORDERS Anxiety Depression     Spinal cord stimulator     GI/Hepatic Neg liver ROS,GERD  Medicated and Controlled,,  Endo/Other   Pre-DM   Renal/GU negative Renal ROS     Musculoskeletal  (+) Arthritis ,    Abdominal   Peds  Hematology  On eliquis  NHL    Anesthesia Other Findings Hoarseness   Reproductive/Obstetrics                              Anesthesia Physical Anesthesia Plan  ASA: 3  Anesthesia Plan: General   Post-op Pain Management: Tylenol  PO (pre-op)*   Induction: Intravenous  PONV Risk Score and Plan: 2 and Treatment may vary due to age or medical condition, Ondansetron  and Propofol  infusion  Airway Management Planned: Oral ETT  Additional Equipment: None  Intra-op Plan:   Post-operative Plan: Extubation in OR  Informed Consent: I have reviewed the patients History and Physical, chart, labs and discussed the procedure including the risks, benefits and alternatives for the proposed anesthesia with the patient or authorized representative who has indicated his/her understanding and acceptance.     Dental advisory given  Plan  Discussed with: CRNA and Anesthesiologist  Anesthesia Plan Comments: (See recent PAT note from ~1 mo ago)         Anesthesia Quick Evaluation

## 2023-12-19 LAB — BASIC METABOLIC PANEL WITH GFR
Anion gap: 10 (ref 5–15)
BUN: 9 mg/dL (ref 8–23)
CO2: 28 mmol/L (ref 22–32)
Calcium: 8.6 mg/dL — ABNORMAL LOW (ref 8.9–10.3)
Chloride: 102 mmol/L (ref 98–111)
Creatinine, Ser: 0.91 mg/dL (ref 0.44–1.00)
GFR, Estimated: >60 mL/min (ref >60)
Glucose, Bld: 121 mg/dL — ABNORMAL HIGH (ref 70–99)
Potassium: 4 mmol/L (ref 3.5–5.1)
Sodium: 140 mmol/L (ref 135–145)

## 2023-12-19 LAB — CBC
HCT: 40.9 % (ref 36.0–46.0)
Hemoglobin: 12.4 g/dL (ref 12.0–15.0)
MCH: 29.2 pg (ref 26.0–34.0)
MCHC: 30.3 g/dL (ref 30.0–36.0)
MCV: 96.2 fL (ref 80.0–100.0)
Platelets: 240 K/uL (ref 150–400)
RBC: 4.25 MIL/uL (ref 3.87–5.11)
RDW: 13.7 % (ref 11.5–15.5)
WBC: 15.8 K/uL — ABNORMAL HIGH (ref 4.0–10.5)
nRBC: 0 % (ref 0.0–0.2)

## 2023-12-19 NOTE — Progress Notes (Signed)
 Pt has been going to bathroom with assistance pt stumbles forward when walking even though she has a boot and walker with her, administered pain medications and gave ice pack for abdominal pain pt instructed writer that she could not do the walk in hallway she is tired and in too much pain

## 2023-12-19 NOTE — Progress Notes (Signed)
 1 Day Post-Op Robotic Extended R Colectomy Subjective: Feels a bit sore, no nausea, tolerating liquids.    Objective: Vital signs in last 24 hours: Temp:  [97.3 F (36.3 C)-98.4 F (36.9 C)] 98.3 F (36.8 C) (10/02 0651) Pulse Rate:  [52-59] 55 (10/02 0651) Resp:  [7-18] 16 (10/02 0651) BP: (143-194)/(54-72) 144/54 (10/02 0651) SpO2:  [91 %-100 %] 99 % (10/02 0651) Weight:  [81.3 kg-83.9 kg] 81.3 kg (10/02 0500)   Intake/Output from previous day: 10/01 0701 - 10/02 0700 In: 1729.1 [P.O.:330; I.V.:1299.1; IV Piggyback:100] Out: 1350 [Urine:1300; Blood:50] Intake/Output this shift: No intake/output data recorded.   General appearance: alert and cooperative GI: soft, non-distended  Incision: no significant drainage  Lab Results:  Recent Labs    12/19/23 0425  WBC 15.8*  HGB 12.4  HCT 40.9  PLT 240   BMET Recent Labs    12/19/23 0425  NA 140  K 4.0  CL 102  CO2 28  GLUCOSE 121*  BUN 9  CREATININE 0.91  CALCIUM  8.6*   PT/INR No results for input(s): LABPROT, INR in the last 72 hours. ABG No results for input(s): PHART, HCO3 in the last 72 hours.  Invalid input(s): PCO2, PO2  MEDS, Scheduled  acetaminophen   1,000 mg Oral Q6H   alvimopan   12 mg Oral BID   amiodarone   100 mg Oral Daily   amLODipine   2.5 mg Oral Daily   atorvastatin   40 mg Oral Daily   enoxaparin  (LOVENOX ) injection  40 mg Subcutaneous Q24H   feeding supplement  237 mL Oral BID BM   fluticasone  furoate-vilanterol  1 puff Inhalation Daily   gabapentin   200 mg Oral TID   metoprolol  succinate  50 mg Oral Daily   pantoprazole   40 mg Oral QAC breakfast   PARoxetine   10 mg Oral Daily   saccharomyces boulardii  250 mg Oral BID    Studies/Results: No results found.  Assessment: s/p Procedure(s): COLECTOMY, PARTIAL, ROBOT-ASSISTED, LAPAROSCOPIC. RIGHT COLON AND EXTENDING TRANSVERSE COLON RESECTION Patient Active Problem List   Diagnosis Date Noted   Cancer of transverse  colon (HCC) 12/18/2023   Closed fracture of right ankle 11/20/2023   Abdominal pain 07/17/2023   IDA (iron  deficiency anemia) 05/27/2023   Diverticulosis of colon without hemorrhage 04/10/2023   Hematochezia 04/10/2023   Acute on chronic anemia 04/10/2023   Multifocal pneumonia 04/08/2023   GI bleed 04/08/2023   Excessive sweating 10/30/2022   Status post insertion of spinal cord stimulator 08/07/2022   Left foot pain 07/06/2022   Lumbar post-laminectomy syndrome 03/08/2022   Radicular pain of left lower extremity 03/08/2022   Prediabetes 12/25/2021   COPD GOLD 2 / still smoking  12/13/2020   Cigarette smoker 12/13/2020   B12 deficiency 10/19/2020   Tinea pedis 10/19/2020   Mucus plugging of bronchi 09/02/2020   Pulmonary artery hypertension (HCC) 09/02/2020   Coronary artery disease involving native coronary artery of native heart without angina pectoris 09/02/2020   Abnormal CT scan of lung 09/02/2020   Ground glass opacity present on imaging of lung 09/01/2020   Atherosclerosis of aorta 08/23/2020   SOB (shortness of breath) 08/22/2020   HLD (hyperlipidemia) 06/22/2020   Hoarseness 07/25/2018   Intermittent atrial fibrillation (HCC) 05/30/2018   Lumbar foraminal stenosis 04/25/2018   HTN (hypertension) 03/26/2018   Anxiety and depression 03/26/2018   Routine physical examination 03/26/2018   Chronic low back pain 03/26/2018   Follicular lymphoma of lymph nodes of multiple regions (HCC) 01/30/2016   DDD (degenerative  disc disease), lumbar 05/31/2015    Expected post op course  Plan: d/c foley Advance diet SL IVFs Ambulate with assistance as pt is recovering from ankle fracture Restart anticoagulation tomorrow   LOS: 1 day     .Bernarda JAYSON Ned, MD [facility], [place]    12/19/2023 7:53 AM

## 2023-12-19 NOTE — Plan of Care (Signed)
   Problem: Clinical Measurements: Goal: Will remain free from infection Outcome: Progressing Goal: Diagnostic test results will improve Outcome: Progressing

## 2023-12-20 ENCOUNTER — Other Ambulatory Visit (HOSPITAL_COMMUNITY): Payer: Self-pay

## 2023-12-20 ENCOUNTER — Other Ambulatory Visit: Payer: Self-pay

## 2023-12-20 DIAGNOSIS — C189 Malignant neoplasm of colon, unspecified: Secondary | ICD-10-CM

## 2023-12-20 LAB — BASIC METABOLIC PANEL WITH GFR
Anion gap: 10 (ref 5–15)
BUN: 10 mg/dL (ref 8–23)
CO2: 28 mmol/L (ref 22–32)
Calcium: 8.8 mg/dL — ABNORMAL LOW (ref 8.9–10.3)
Chloride: 105 mmol/L (ref 98–111)
Creatinine, Ser: 0.7 mg/dL (ref 0.44–1.00)
GFR, Estimated: >60 mL/min (ref >60)
Glucose, Bld: 108 mg/dL — ABNORMAL HIGH (ref 70–99)
Potassium: 3.6 mmol/L (ref 3.5–5.1)
Sodium: 143 mmol/L (ref 135–145)

## 2023-12-20 LAB — CBC
HCT: 34.9 % — ABNORMAL LOW (ref 36.0–46.0)
Hemoglobin: 10.8 g/dL — ABNORMAL LOW (ref 12.0–15.0)
MCH: 30 pg (ref 26.0–34.0)
MCHC: 30.9 g/dL (ref 30.0–36.0)
MCV: 96.9 fL (ref 80.0–100.0)
Platelets: 206 K/uL (ref 150–400)
RBC: 3.6 MIL/uL — ABNORMAL LOW (ref 3.87–5.11)
RDW: 13.9 % (ref 11.5–15.5)
WBC: 13.7 K/uL — ABNORMAL HIGH (ref 4.0–10.5)
nRBC: 0 % (ref 0.0–0.2)

## 2023-12-20 MED ORDER — TRAMADOL HCL 50 MG PO TABS
50.0000 mg | ORAL_TABLET | Freq: Four times a day (QID) | ORAL | 0 refills | Status: AC | PRN
  Filled 2023-12-20: qty 30, 8d supply, fill #0

## 2023-12-20 NOTE — Discharge Instructions (Signed)

## 2023-12-20 NOTE — Discharge Summary (Addendum)
 Physician Discharge Summary  Patient ID: Samantha [name] MRN: [id number] DOB/AGE: 82-19-1942 82 y.o.  Admit date: 12/18/2023 Discharge date: 12/20/2023  Admission Diagnoses: Colon cancer Discharge Diagnoses:  Principal Problem:   Cancer of transverse colon [facility])   Discharged Condition: good  Hospital Course: Patient was admitted to the med surg floor after surgery.  Diet was advanced as tolerated.  Patient began to have bowel function on postop day 1.  By postop day 2, she was tolerating a solid diet and pain was controlled with oral medications.  She was urinating without difficulty and ambulating without assistance.  Patient was felt to be in stable condition for discharge to home.   Consults: None  Significant Diagnostic Studies: labs: cbc, bmet  Treatments: IV hydration, analgesia: acetaminophen , and surgery:   Discharge Exam: Blood pressure (!) 147/55, pulse 67, temperature 98.5 F (36.9 C), temperature source Oral, resp. rate 16, height 5' 9 (1.753 m), weight 79.8 kg, SpO2 90%. General appearance: alert and cooperative GI: soft, non-distended Wound: mild erythema lateral to incision.  No signs of infection noted Disposition: Discharge disposition: 01-Home or Self Care        Allergies as of 12/20/2023       Reactions   Hydrocodone Rash   Itching, rash   Oxycodone  Rash        Medication List     TAKE these medications    acetaminophen  500 MG tablet Commonly known as: TYLENOL  Take 1,000 mg by mouth every 8 (eight) hours as needed for moderate pain (pain score 4-6).   albuterol  108 (90 Base) MCG/ACT inhaler Commonly known as: VENTOLIN  HFA Inhale 2 puffs into the lungs every 6 (six) hours as needed for wheezing or shortness of breath.   ALPRAZolam  0.25 MG tablet Commonly known as: XANAX  Take 1 tablet (0.25 mg total) by mouth daily as needed for anxiety.   amiodarone  200 MG tablet Commonly known as: PACERONE  Take 0.5 tablets (100 mg total) by mouth  daily. TAKE 1/2 TABLET EVERY MORNING   amLODipine  2.5 MG tablet Commonly known as: NORVASC  Take 1 tablet (2.5 mg total) by mouth daily.   apixaban  5 MG Tabs tablet Commonly known as: Eliquis  Take 1 tablet (5 mg total) by mouth 2 (two) times daily.   atorvastatin  40 MG tablet Commonly known as: LIPITOR Take 1 tablet (40 mg total) by mouth daily. *NEED OFFICE VISIT* TAKE 1 TABLET EVERY DAY   b complex vitamins capsule Take 1 capsule by mouth daily.   fluticasone  furoate-vilanterol 100-25 MCG/ACT Aepb Commonly known as: Breo Ellipta  Inhale 1 puff into the lungs daily.   gabapentin  100 MG capsule Commonly known as: NEURONTIN  TAKE 2 CAPSULES BY MOUTH 3 TIMES A DAY   metoprolol  succinate 50 MG 24 hr tablet Commonly known as: TOPROL -XL Take 0.5 tablets (25 mg total) by mouth daily. What changed: how much to take   nitroGLYCERIN  0.4 MG SL tablet Commonly known as: NITROSTAT  Place 1 tablet (0.4 mg total) under the tongue every 5 (five) minutes x 3 doses as needed for chest pain.   pantoprazole  40 MG tablet Commonly known as: PROTONIX  Take 1 tablet (40 mg total) by mouth daily before breakfast.   PARoxetine  10 MG tablet Commonly known as: PAXIL  Take 1 tablet (10 mg total) by mouth daily.   traMADol  50 MG tablet Commonly known as: Ultram  Take 1 tablet (50 mg total) by mouth every 6 (six) hours as needed. What changed: Another medication with the same name was removed. Continue taking  this medication, and follow the directions you see here.        Follow-up Information     Debby Hila, MD. Schedule an appointment as soon as possible for a visit in 2 week(s).   Specialties: General Surgery, Colon and Rectal Surgery Contact information: [address] [place] [address] [place] [address] [phone]                 Signed: Hila JAYSON Debby 12/20/2023, 8:31 AM

## 2023-12-20 NOTE — Care Management Important Message (Signed)
 Important Message  Patient Details IM Letter given Name: Samantha [name] MRN: [id number] Date of Birth: 03/13/1941   Important Message Given:  Yes - Medicare IM     [name] 12/20/2023, 9:26 AM

## 2023-12-23 ENCOUNTER — Telehealth: Payer: Self-pay

## 2023-12-23 ENCOUNTER — Inpatient Hospital Stay: Admitting: Hematology

## 2023-12-23 ENCOUNTER — Inpatient Hospital Stay: Attending: Hematology

## 2023-12-23 VITALS — BP 102/44 | HR 65 | Temp 97.3°F | Resp 20 | Wt 183.6 lb

## 2023-12-23 DIAGNOSIS — D500 Iron deficiency anemia secondary to blood loss (chronic): Secondary | ICD-10-CM

## 2023-12-23 DIAGNOSIS — Z720 Tobacco use: Secondary | ICD-10-CM | POA: Insufficient documentation

## 2023-12-23 DIAGNOSIS — C189 Malignant neoplasm of colon, unspecified: Secondary | ICD-10-CM

## 2023-12-23 DIAGNOSIS — Z8572 Personal history of non-Hodgkin lymphomas: Secondary | ICD-10-CM | POA: Insufficient documentation

## 2023-12-23 DIAGNOSIS — Z9049 Acquired absence of other specified parts of digestive tract: Secondary | ICD-10-CM | POA: Insufficient documentation

## 2023-12-23 DIAGNOSIS — C8298 Follicular lymphoma, unspecified, lymph nodes of multiple sites: Secondary | ICD-10-CM

## 2023-12-23 DIAGNOSIS — C184 Malignant neoplasm of transverse colon: Secondary | ICD-10-CM | POA: Diagnosis not present

## 2023-12-23 LAB — IRON AND IRON BINDING CAPACITY (CC-WL,HP ONLY)
Iron: 17 ug/dL — ABNORMAL LOW (ref 28–170)
Saturation Ratios: 6 % — ABNORMAL LOW (ref 10.4–31.8)
TIBC: 270 ug/dL (ref 250–450)
UIBC: 253 ug/dL (ref 148–442)

## 2023-12-23 LAB — CBC WITH DIFFERENTIAL (CANCER CENTER ONLY)
Abs Immature Granulocytes: 0.17 K/uL — ABNORMAL HIGH (ref 0.00–0.07)
Basophils Absolute: 0.1 K/uL (ref 0.0–0.1)
Basophils Relative: 0 %
Eosinophils Absolute: 0.4 K/uL (ref 0.0–0.5)
Eosinophils Relative: 3 %
HCT: 40.2 % (ref 36.0–46.0)
Hemoglobin: 12.9 g/dL (ref 12.0–15.0)
Immature Granulocytes: 1 %
Lymphocytes Relative: 18 %
Lymphs Abs: 2.7 K/uL (ref 0.7–4.0)
MCH: 29.9 pg (ref 26.0–34.0)
MCHC: 32.1 g/dL (ref 30.0–36.0)
MCV: 93.1 fL (ref 80.0–100.0)
Monocytes Absolute: 1.4 K/uL — ABNORMAL HIGH (ref 0.1–1.0)
Monocytes Relative: 9 %
Neutro Abs: 10.2 K/uL — ABNORMAL HIGH (ref 1.7–7.7)
Neutrophils Relative %: 69 %
Platelet Count: 342 K/uL (ref 150–400)
RBC: 4.32 MIL/uL (ref 3.87–5.11)
RDW: 13.9 % (ref 11.5–15.5)
WBC Count: 14.9 K/uL — ABNORMAL HIGH (ref 4.0–10.5)
nRBC: 0 % (ref 0.0–0.2)

## 2023-12-23 LAB — CMP (CANCER CENTER ONLY)
ALT: 9 U/L (ref 0–44)
AST: 15 U/L (ref 15–41)
Albumin: 3.4 g/dL — ABNORMAL LOW (ref 3.5–5.0)
Alkaline Phosphatase: 67 U/L (ref 38–126)
Anion gap: 8 (ref 5–15)
BUN: 8 mg/dL (ref 8–23)
CO2: 31 mmol/L (ref 22–32)
Calcium: 9.6 mg/dL (ref 8.9–10.3)
Chloride: 104 mmol/L (ref 98–111)
Creatinine: 0.89 mg/dL (ref 0.44–1.00)
GFR, Estimated: >60 mL/min (ref >60)
Glucose, Bld: 107 mg/dL — ABNORMAL HIGH (ref 70–99)
Potassium: 4 mmol/L (ref 3.5–5.1)
Sodium: 143 mmol/L (ref 135–145)
Total Bilirubin: 0.4 mg/dL (ref 0.0–1.2)
Total Protein: 6.8 g/dL (ref 6.5–8.1)

## 2023-12-23 LAB — LACTATE DEHYDROGENASE: LDH: 165 U/L (ref 98–192)

## 2023-12-23 LAB — CEA (ACCESS): CEA (CHCC): 4.77 ng/mL (ref 0.00–5.00)

## 2023-12-23 LAB — FERRITIN: Ferritin: 210 ng/mL (ref 11–307)

## 2023-12-23 LAB — SURGICAL PATHOLOGY

## 2023-12-23 NOTE — Progress Notes (Signed)
 HEMATOLOGY ONCOLOGY PROGRESS NOTE  Date of service: 12/23/2023  Patient Care Team: Dineen Rollene MATSU, FNP as PCP - General (Family Medicine) Perla, Evalene PARAS, MD as PCP - Cardiology (Cardiology) Green, Davina E, RN as VBCI Care Management  CHIEF COMPLAINT/PURPOSE OF CONSULTATION: Follow-up on newly diagnosed Colon Cancer, recently s/p Partial Right Colectomy   HISTORY OF PRESENTING ILLNESS: Samantha [name] is a wonderful 83 y.o. female who has been referred to us  by Dr. Rollene Dineen for evaluation and management of recurrence of lymphoma. Patient was having excessive night sweats and saw her PCP. She had an CT chest and abdomen on 10/20/2022 which shows concerns for recurrence.    Patient previously used to follow-up with me for follicular lymphoma of lymph nodes of multiple regions and was last seen by me on 07/24/2019. Patient was suppose to follow-up with us , but did not follow-up. Patient was treated with 5 cycles of Bendamustine  and Rituxan , with her last treatment in June 2018. PET scan and CT scans after 2018 did not show any disease progression.    Patient is accompanied by her son during this visit. She notes she has been doing well overall for the past 6 months. She notes she has been having drenching night sweats for around 1-2 months. She denies any recent infections or any respiratory symptoms. Patient notes she is not been having night sweats for the past 2 weeks.    Patient notes she has been taking Gabapentin  for a long time and has been tolerating it well without any new or severe toxicities.    She denies any recent steroid usage or any other medicine changes in the past 6 months. She is complaint with all of her medicine. She denies taking any allergy medication.    She denies any new infection issues, fever, chills, unexpected weight loss, chest pain, abdominal pain, back pain, or leg swelling. She does complain of mild SOB and mild occasional fatigue. Patient complains  of more frequent bathroom usage, but denies diarrhea. She denies black stool or blood in stool.    During the physical examination, patient complained of mild lower left abdominal discomfort. Denies pain.    Patient notes she used to have hot flashes and went to her PCP. She was prescribed Paxil  10 mg which has improved her hot flashes and nightsweats. She denies hot flashes during this visit.     Patient notes she smokes little less than half pack a day.    She reports of mild change in appetite, but denies any weight loss.   (10/30/2023) Samantha [name] is here with her son for evaluation and management of newly diagnosed colon cancer. Patient was having iron  deficiency anemia and was recommended to get a GI workup. She had a colonoscopy and endoscopy in 09/20/2023.  With Dr. Unk. Colonoscopy showed Two medium- sized angioectasias without bleeding were found in the cecum. Coagulation for hemostasis using argon plasma was successful. An infiltrative, submucosal and ulcerated non- obstructing large mass was found in the descending colon and at 60 cm proximal to the anus. The mass was partially circumferential ( involving one- half of the lumen circumference) . No bleeding was present. Biopsies were taken with a cold forceps for histology. Biopsy of the colonic mass showed well to moderately differentiated adenocarcinoma of the colon in the descending colon. Patient currently denies any overt GI bleeding.  No abdominal pain or distention. She continues to smoke at least a pack a day. Labs on today were reviewed  and detailed with her.  SUMMARY OF ONCOLOGIC HISTORY: Oncology History   No history exists.    INTERVAL HISTORY:  Samantha [name] is a 83 y.o. female being seen here today for follow-up on newly diagnosed Colon Cancer. S/p Partial Right Colectomy & Extending Transverse Colon Resection 12/18/2023; S/p Right Ankle ORIF 11/20/2023. Today, she is accompanied by her son and cousin, and is  ambulating in a wheelchair.  she was last seen by me on 11/19/2023; at the time she mentioned experiencing a fall resulting in an ankle fracture, but otherwise did not mention any acute symptoms.  Today, she does endorse pain due to passing gas, but has been having daily bowel movements, and is eating solid foods. Says  that her incision is healing well and denies fevers/chills or night sweats. Normally lives solitarily but her cousin has recently been assisting her since her procedure. She expresses that she is minimizing her smoking.   REVIEW OF SYSTEMS:    10 Point review of systems of done and is negative except as noted above.  MEDICAL HISTORY Past Medical History:  Diagnosis Date   A-fib (HCC)    Anemia    hx iron  infusions   Anxiety    Arthritis    Chronic back pain    Colon cancer (HCC) 10/2023   COPD (chronic obstructive pulmonary disease) (HCC)    Coronary artery disease    COVID-19    08/22/20 likely contracted late 07/2020   Depression    GERD (gastroesophageal reflux disease)    Glaucoma    bilateral   Hearing aid worn    High cholesterol    Hypertension    Lymphadenopathy 01/25/2016   Myocardial infarction (HCC)    2000   Non Hodgkin's lymphoma (HCC)    Pneumonia 2025   x 1   Pre-diabetes    Wears dentures     Top plate   Wears glasses     SURGICAL HISTORY Past Surgical History:  Procedure Laterality Date   CARDIAC CATHETERIZATION     CATARACT EXTRACTION W/ INTRAOCULAR LENS IMPLANT     CHOLECYSTECTOMY     COLONOSCOPY N/A 10/10/2023   Procedure: COLONOSCOPY;  Surgeon: Unk Corinn Skiff, MD;  Location: [facility] ENDOSCOPY;  Service: Gastroenterology;  Laterality: N/A;   COLONOSCOPY W/ BIOPSIES AND POLYPECTOMY     CORONARY STENT PLACEMENT     ESOPHAGOGASTRODUODENOSCOPY N/A 10/10/2023   Procedure: EGD (ESOPHAGOGASTRODUODENOSCOPY);  Surgeon: Unk Corinn Skiff, MD;  [facility] ENDOSCOPY;  Service: Gastroenterology;  Laterality: N/A;   INGUINAL LYMPH NODE  BIOPSY Right 01/25/2016   Procedure: EXCISION DEEP RIGHT INGUINAL LYMPH NODE BIOPSY WITH ULTRA SOUND;  Surgeon: Elon Pacini, MD;  Location: [facility] OR;  Service: General;  Laterality: Right;   ORIF ANKLE FRACTURE Right 11/20/2023   Procedure: OPEN REDUCTION INTERNAL FIXATION (ORIF) ANKLE FRACTURE;  Surgeon: Harden Jerona GAILS, MD;  Location: [facility] OR;  Service: Orthopedics;  Laterality: Right;   POLYPECTOMY  10/10/2023   Procedure: POLYPECTOMY, INTESTINE;  Surgeon: Unk Corinn Skiff, MD;  Location: [facility] ENDOSCOPY;  Service: Gastroenterology;;   SPINAL CORD STIMULATOR IMPLANT  04/20/2022   SPINE SURGERY      SOCIAL HISTORY Social History   Tobacco Use   Smoking status: Every Day    Current packs/day: 0.50    Average packs/day: 0.5 packs/day for 60.0 years (30.0 ttl pk-yrs)    Types: Cigarettes   Smokeless tobacco: Never   Tobacco comments:    1/2 ppd--04/23/2023  Vaping Use   Vaping status:  Never Used  Substance Use Topics   Alcohol use: Yes    Comment: social - mixed drink   Drug use: No    Social History   Social History Narrative   Not on file    SOCIAL DRIVERS OF HEALTH SDOH Screenings   Food Insecurity: No Food Insecurity (12/23/2023)  Housing: Unknown (12/23/2023)  Transportation Needs: No Transportation Needs (12/23/2023)  Utilities: Not At Risk (12/23/2023)  Alcohol Screen: Low Risk  (09/30/2023)  Depression (PHQ2-9): Low Risk  (12/23/2023)  Financial Resource Strain: Low Risk  (09/30/2023)  Physical Activity: Inactive (09/30/2023)  Social Connections: Socially Isolated (12/18/2023)  Stress: No Stress Concern Present (09/30/2023)  Tobacco Use: High Risk (12/18/2023)  Health Literacy: Adequate Health Literacy (09/30/2023)     FAMILY HISTORY Family History  Problem Relation Age of Onset   Hypertension Mother    Stroke Mother    Heart disease Father    Heart disease Other      ALLERGIES: is allergic to hydrocodone and oxycodone .  MEDICATIONS  Current Outpatient  Medications  Medication Sig Dispense Refill   acetaminophen  (TYLENOL ) 500 MG tablet Take 1,000 mg by mouth every 8 (eight) hours as needed for moderate pain (pain score 4-6).     albuterol  (VENTOLIN  HFA) 108 (90 Base) MCG/ACT inhaler Inhale 2 puffs into the lungs every 6 (six) hours as needed for wheezing or shortness of breath. 8 g 2   ALPRAZolam  (XANAX ) 0.25 MG tablet Take 1 tablet (0.25 mg total) by mouth daily as needed for anxiety. 30 tablet 1   amiodarone  (PACERONE ) 200 MG tablet Take 0.5 tablets (100 mg total) by mouth daily. TAKE 1/2 TABLET EVERY MORNING 45 tablet 3   amLODipine  (NORVASC ) 2.5 MG tablet Take 1 tablet (2.5 mg total) by mouth daily. 90 tablet 3   apixaban  (ELIQUIS ) 5 MG TABS tablet Take 1 tablet (5 mg total) by mouth 2 (two) times daily. 180 tablet 1   atorvastatin  (LIPITOR) 40 MG tablet Take 1 tablet (40 mg total) by mouth daily. *NEED OFFICE VISIT* TAKE 1 TABLET EVERY DAY 90 tablet 3   b complex vitamins capsule Take 1 capsule by mouth daily. (Patient not taking: Reported on 12/23/2023) 30 capsule 5   fluticasone  furoate-vilanterol (BREO ELLIPTA ) 100-25 MCG/ACT AEPB Inhale 1 puff into the lungs daily. 90 each 3   gabapentin  (NEURONTIN ) 100 MG capsule TAKE 2 CAPSULES BY MOUTH 3 TIMES A DAY 270 capsule 1   metoprolol  succinate (TOPROL -XL) 50 MG 24 hr tablet Take 0.5 tablets (25 mg total) by mouth daily. 90 tablet 3   nitroGLYCERIN  (NITROSTAT ) 0.4 MG SL tablet Place 1 tablet (0.4 mg total) under the tongue every 5 (five) minutes x 3 doses as needed for chest pain. 25 tablet 0   pantoprazole  (PROTONIX ) 40 MG tablet Take 1 tablet (40 mg total) by mouth daily before breakfast. 30 tablet 1   PARoxetine  (PAXIL ) 10 MG tablet Take 1 tablet (10 mg total) by mouth daily. 90 tablet 3   traMADol  (ULTRAM ) 50 MG tablet Take 1 tablet (50 mg total) by mouth every 6 (six) hours as needed. 30 tablet 0   No current facility-administered medications for this visit.    VITALS: Vitals:    12/23/23 1150  BP: (!) 102/44  Pulse: 65  Resp: 20  Temp: (!) 97.3 F (36.3 C)  SpO2: 99%   Filed Weights   12/23/23 1150  Weight: 183 lb 9.6 oz (83.3 kg)   Body mass index is 27.11 kg/m.  PHYSICAL  EXAMINATION: ECOG PERFORMANCE STATUS: 1 - Symptomatic but completely ambulatory .BP (!) 102/44   Pulse 65   Temp (!) 97.3 F (36.3 C)   Resp 20   Wt 183 lb 9.6 oz (83.3 kg)   SpO2 99%   BMI 27.11 kg/m   GENERAL: alert, in no acute distress and comfortable SKIN: no acute rashes, no significant lesions EYES: conjunctiva are pink and non-injected, sclera anicteric OROPHARYNX: MMM, no exudates, no oropharyngeal erythema or ulceration NECK: supple, no JVD LYMPH:  no palpable lymphadenopathy in the cervical, axillary or inguinal regions LUNGS: clear to auscultation b/l with normal respiratory effort HEART: regular rate & rhythm ABDOMEN:  normoactive bowel sounds , non tender, not distended. Extremity: no pedal edema PSYCH: alert & oriented x 3 with fluent speech NEURO: no focal motor/sensory deficits  LABORATORY DATA:   I have reviewed the data as listed     Latest Ref Rng & Units 12/23/2023   11:27 AM 12/20/2023    4:28 AM 12/19/2023    4:25 AM  CBC EXTENDED  WBC 4.0 - 10.5 K/uL 14.9  13.7  15.8   RBC 3.87 - 5.11 MIL/uL 4.32  3.60  4.25   Hemoglobin 12.0 - 15.0 g/dL 87.0  89.1  87.5   HCT 36.0 - 46.0 % 40.2  34.9  40.9   Platelets 150 - 400 K/uL 342  206  240   NEUT# 1.7 - 7.7 K/uL 10.2     Lymph# 0.7 - 4.0 K/uL 2.7         Latest Ref Rng & Units 12/23/2023   11:27 AM 12/20/2023    4:28 AM 12/19/2023    4:25 AM  CMP  Glucose 70 - 99 mg/dL 892  891  878   BUN 8 - 23 mg/dL 8  10  9    Creatinine 0.44 - 1.00 mg/dL 9.10  9.29  9.08   Sodium 135 - 145 mmol/L 143  143  140   Potassium 3.5 - 5.1 mmol/L 4.0  3.6  4.0   Chloride 98 - 111 mmol/L 104  105  102   CO2 22 - 32 mmol/L 31  28  28    Calcium  8.9 - 10.3 mg/dL 9.6  8.8  8.6   Total Protein 6.5 - 8.1 g/dL 6.8      Total Bilirubin 0.0 - 1.2 mg/dL 0.4     Alkaline Phos 38 - 126 U/L 67     AST 15 - 41 U/L 15     ALT 0 - 44 U/L 9         12/18/2023 Surgical Pathology  Surgical Pathology Report  Clinical History: colon cancer  FINAL MICROSCOPIC DIAGNOSIS:  A. ASCENDING AND TRANSVERSE COLON, RESECTION: - Invasive mucinous colorectal adenocarcinoma. - Seventeen lymph nodes negative for malignancy (0/17). - See cancer summary below. - Unremarkable proximal small intestinal and distal colonic resection margins. - Tattoo ink. - Unremarkable appendix.  CASE SUMMARY: COLON AND RECTUM RESECTION Standard(s): AJCC 8  SPECIMEN Procedure: Partial colectomy  TUMOR Tumor Site: Transverse colon Histologic Type: Mucinous adenocarcinoma Histologic Grade: G3, poorly differentiated Tumor Size: Greatest dimension: 7.5 cm Tumor Extent: Invades through muscularis propria into the pericolic tissue Macroscopic Tumor Perforation: Not identified Lymphatic and/or Vascular Invasion: Not identified Perineural Invasion: Not identified Tumor Budding Score: Low (0-4) Treatment Effect: No known presurgical therapy  MARGINS Margin Status for Invasive Carcinoma: All margins negative for invasive carcinoma Margin Status for Non-Invasive Tumor: All margins negative for high-grade dysplasia/intramucosal carcinoma and low-grade  dysplasia  REGIONAL LYMPH NODES Regional Lymph Node Status: All regional lymph nodes negative for tumor      Number of Lymph Nodes Examined: 17 Tumor Deposits: 0  DISTANT METASTASES Distant Site(s) Involved, if applicable: Not applicable  PATHOLOGIC STAGE CLASSIFICATION (pTNM, AJCC 8th Edition): Modified Classification: Not applicable pT3      T suffix: Not applicable pN0 pM - Not applicable  ADDITIONAL FINDINGS: None  SPECIAL STUDIES Results of immunohistochemistry for MMR (mismatch repair proteins) will be resulted in an addendum. (v4.4.0.1)  GROSS DESCRIPTION:  Specimen:  Received fresh is ascending and transverse colon Specimen integrity: Intact Specimen length: Small intestine = 9 cm in length by 1.5 cm in diameter, cecum = 6 cm in length by 3 cm in diameter, appendix = 8.5 cm in length by 0.5 cm in diameter, large intestine = 42 cm in length by up to 3.2 cm in diameter Tumor location: Mid transverse Tumor size: The tumor measures 7.5 x 5.6 cm and consists of a tan-pink lesion with heaped up borders and a central area of ulceration. Percent of bowel circumference involved: 100% Tumor distance to margins:                      Proximal: 44 cm                      Distal: 3.5 cm                      Radial: 9.5 cm Macroscopic extent of tumor invasion: The tumor grossly extends into the serosa and may involve the pericolic fat. Total presumed lymph nodes: 22 lymph node candidates are identified ranging from 0.1 to 1.0 cm. Extramural satellite tumor nodules: None grossly identified Mucosal polyp(s): None grossly identified Additional findings: The appendix is sectioned revealing a pinpoint, patent lumen, without distinct lesions or fecaliths.  The mucosa is tan and glistening. Block summary: A1 = proximal margin A2 = distal margin A3 = radial margin closest to tumor A4-A6 = tumor to deepest extent A7 = tumor to proximal bowel A8 = tumor to distal bowel A9 = appendix A10-A13 = 16 lymph node candidates, whole (4 per block) A14 = 2 lymph node candidates, whole A15 = 2 lymph node candidates, whole A16 = 1 lymph node candidate, bisected A17 = 1 lymph node candidate, trisected (KW, 12/19/2023)    Final Diagnosis performed by Rexene Daily, MD.   Electronically signed 12/20/2023 Technical component performed at [facility], [address], [place], [place] [address].  Professional component performed at [facility]. [address], [place], [place] [address]  Immunohistochemistry Technical component  (if applicable) was performed at [facility] [employer] [facility]. [address], [place], [place] [address].  IMMUNOHISTOCHEMISTRY DISCLAIMER (if applicable): Some of these immunohistochemical stains may have been developed and the performance characteristics determine by [facility]. Some may not have been cleared or approved by the U.S. Food and Drug Administration. The FDA has determined that such clearance or approval is not necessary. This test is used for clinical purposes. It should not be regarded as investigational or for research. This laboratory is certified under the Clinical Laboratory Improvement Amendments of 1988 (CLIA-88) as qualified to perform high complexity clinical laboratory testing.  The controls stained appropriately.     11/07/2023 CT CHEST ABDOMEN PELVIS W CONTRAST  CLINICAL DATA:  Colon cancer staging. Also history of non-Hodgkin's lymphoma.  TECHNIQUE: Multidetector CT imaging of the chest, abdomen and pelvis was performed following the standard protocol during bolus administration of intravenous contrast.   RADIATION DOSE REDUCTION: This exam was performed according to the departmental dose-optimization program which includes automated exposure control, adjustment of the mA and/or kV according to patient size and/or use of iterative reconstruction technique.   CONTRAST:  OMNIPAQUE  IOHEXOL  300 MG/ML  SOLN   COMPARISON:  Chest CT dated 06/04/2023 and CT chest abdomen pelvis dated 10/30/2022.   FINDINGS: CT CHEST FINDINGS   Cardiovascular: There is no cardiomegaly or pericardial effusion. There is coronary vascular calcification. Mild atherosclerotic calcification of the thoracic aorta. No aneurysmal dilatation or dissection. The origins of the great vessels of the aortic arch and the central pulmonary arteries are patent.   Mediastinum/Nodes: No hilar or mediastinal adenopathy. The esophagus and the thyroid  gland are  grossly unremarkable. No mediastinal fluid collection.   Lungs/Pleura: Mild bibasilar subpleural interstitial coarsening likely fibrosis. No focal consolidation, pleural effusion, pneumothorax. The central airways are patent.   Musculoskeletal: Osteopenia with degenerative changes of the spine. No acute osseous pathology. Thoracic spine stimulator.   CT ABDOMEN PELVIS FINDINGS   No intra-abdominal free air or free fluid.   Hepatobiliary: Faint enhancing focus in the dome of the liver (54/2) is not characterized but may represent a small flash filling hemangioma. The liver is otherwise unremarkable. Mild biliary dilatation, post cholecystectomy. No retained calcified stone noted in the central CBD.   Pancreas: Unremarkable. No pancreatic ductal dilatation or surrounding inflammatory changes.   Spleen: Normal in size without focal abnormality.   Adrenals/Urinary Tract: The left adrenal gland is unremarkable. There is an 11 mm indeterminate right adrenal nodule, stable since 2024, likely a benign adenoma. There is mild fullness of the renal collecting systems bilaterally similar to prior CT of 2024. There is symmetric enhancement and excretion of contrast by both kidneys. There is infiltration of the fat plane adjacent to the midportion of the ureters secondary to retroperitoneal adenopathy likely causing a degree of stricture or extrinsic compression and the ureters resulting in fullness of the renal collecting systems. The urinary bladder is unremarkable.   Stomach/Bowel: Mild sigmoid diverticulosis. There is a 5 cm long segment wall thickening of the mid transverse colon consistent with malignancy. There is no bowel obstruction the appendix is normal.   Vascular/Lymphatic: Advanced aortoiliac atherosclerotic disease. The IVC is unremarkable. No portal venous gas. Retroperitoneal adenopathy with infiltrative encasement of the infrarenal abdominal aorta as seen on the CT of  2024. The left para-aortic tissue measures approximately 2.3 cm in thickness similar to prior CT. Right iliac chain lymph node measures 9 mm short axis.   Reproductive: The uterus is grossly unremarkable. No suspicious adnexal masses.   Other: None   Musculoskeletal: Osteopenia with degenerative changes of the spine. L4-L5 posterior fusion and disc spacer. No acute osseous pathology.   IMPRESSION: 1. No acute intrathoracic pathology.  2. A 5 cm long segment of wall thickening of the mid transverse colon consistent with malignancy. No bowel obstruction.  3. Retroperitoneal adenopathy with infiltrative encasement of the infrarenal abdominal aorta as seen on the CT of 2024.  4. Mild fullness of the renal collecting systems bilaterally similar to prior CT of 2024.  5.  Aortic Atherosclerosis (ICD10-I70.0).    10/10/2023 Surgical Pathology FINAL DIAGNOSIS       1. Stomach, biopsy, cbx :      - BENIGN GASTRIC MUCOSA WITH NO SPECIFIC PATHOLOGIC CHANGE.      -  NO EVIDENCE OF H. PYLORI ON H&E STAIN.       2. Cecum Polyp, cold snare :      - TUBULAR ADENOMA.       3. Ascending  Colon Polyp, cold snare :      - TUBULAR ADENOMA.       4. Transverse Colon Polyp, cold snare x3 :      - FRAGMENTS OF TUBULAR ADENOMA.       5. Descending Colon Polyp, cold snare :      - TUBULAR ADENOMA.       6. Descending Colon Biopsy, cbx mass 60 cm :      - ADENOCARCINOMA, WELL TO MODERATELY DIFFERENTIATED (SEE COMMENT).       Diagnosis Note : This specimen (part 6) was seen in peer review on 10/11/2023 by      Dr.Rubinas, who concurs with the interpretation.      This result was reported to Dr. Unk on 10/11/2023.      ELECTRONIC SIGNATURE : Coronel Md, Misti, Sports administrator, International aid/development worker  MICROSCOPIC DESCRIPTION  CASE COMMENTS STAINS USED IN DIAGNOSIS: H&E H&E H&E H&E H&E H&E H&E-2    CLINICAL HISTORY  SPECIMEN(S) OBTAINED 1. Stomach, biopsy, Cbx 2. Cecum Polyp, Cold  Snare 3. Ascending  Colon Polyp, Cold Snare 4. Transverse Colon Polyp, Cold Snare X3 5. Descending Colon Polyp, Cold Snare 6. Descending Colon Biopsy, Cbx Mass 60 Cm  SPECIMEN COMMENTS: 1. Anemia SPECIMEN CLINICAL INFORMATION: 1. IDA.EGD;  gastric erythema    Gross Description 1. Received in formalin are tan, soft tissue fragments that are submitted in toto.Number:  four ,  Size:  0.1 cm smallest to 0.2 cm largest, (1 B) 2. Received in formalin are tan, soft tissue fragments that are submitted in toto.Number:  two  Size:  each 0.2 cm,  (1 B) 3. Received in formalin are tan, soft tissue fragments that are submitted in toto.Number:  multiple ,  Size:  0.5 x 0.5 x 0.1 cm in aggregate    (1 B) 4. In formalin are multiple irregular and polypoid tan soft tissues, 0.7 x 0.6 x 0.2  cm in aggregate.Submitted in one block. 5. Received in formalin are tan, soft tissue fragments that are submitted in toto.Number:  multiple  Size:  0.7 x 0.6 x 0.2 cm  (1B) 6. Received in formalin are tan, soft tissue fragments that are submitted in toto.Number:  multiple ,  Size:  0.8 x 0.7 x 0.2 cm cm.Submitted  in toto in one block.(SSW:kh 10/10/23)     IHC EXPRESSION RESULTS   TEST           RESULT  MLH1:          LOSS OF NUCLEAR EXPRESSION  MSH2:          Preserved nuclear expression  MSH6:          Preserved nuclear expression  PMS2:          LOSS OF NUCLEAR EXPRESSION   RADIOGRAPHIC STUDIES: I have personally reviewed the radiological images as listed and agreed with the findings in the report. No results found.  ASSESSMENT & PLAN:  83 y.o. Caucasian female with multiple chronic medical comorbidities as noted above with    #1 h/o Stage IVB Low grade Follicular Lymphoma diagnosed in 2018 with extensive retroperitoneal lymphadenopathy and right inguinal lymphadenopathy as well as osseous involvement of the sacrum. LDH level within normal limits.  S/p 5 cycles of BR (Bendamustine  70mg /m2)  PET  CT scan after 5 cycles of Bendamustine  and Rituxan  done on 09/06/2016 shows no residual metabolic active disease and no evidence of disease progression.   CT C/A/P on 05/20/17 reveals no radiographic evidence of disease progression.    02/19/18 CT C/A/P revealed Stable mild retroperitoneal adenopathy, compatible with treated lymphoma. No new or progressive adenopathy. No new sites of disease. Normal size spleen. 2. Three-vessel coronary atherosclerosis. 3. Aortic Atherosclerosis. CT chest abdomen pelvis 10/30/2022 showed some retroperitoneal and pelvic lymphadenopathy suggesting a symptomatic recurrence of her low-grade lymphoma.   #2 Newly diagnosed descending colon ?  Transverse colon on CT adenocarcinoma well to moderately differentiated.  On surgical pathology Stage IIA MLH1:          LOSS OF NUCLEAR EXPRESSION  MSH2:          Preserved nuclear expression  MSH6:          Preserved nuclear expression  PMS2:          LOSS OF NUCLEAR EXPRESSION   #3 iron  deficiency related to GI bleeding from her newly diagnosed colon cancer and likely angioectasia which has been lasered.   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 -will need to get BRAF mutation testing, MSI testing and MLH1 methylation status analysis to determine the possibility of germline mutation and need for genetic testing. - Informed her chemotherapy treatment would include taking a Capecitabine twice daily for 3-6 months, with possible side effects nausea/vomiting  Patient declines chemotherapy irrespective of risk factors and MSI status, will continue to monitor condition via imaging. -  Some complexity due to patient's primary follicular lymphoma, causing some enlarged lymph nodes that would be flagged on scans.  3 - Discussed lab results on 12/23/2023 in detail with patient.  CBC overall normal. CMP normal.  - CEA is 4.77 down from 13.63 and Ferritin is 210 with iron  saturation of 6% LDH wnl at 165  FOLLOW-UP   Return to clinic with Dr. Onesimo with labs in 3 months  Schedule post-op visit with Dr. Bernarda Ned  .The total time spent in the appointment was 40 minutes* .  All of the patient's questions were answered with apparent satisfaction. The patient knows to call the clinic with any problems, questions or concerns.   Emaline Onesimo MD MS AAHIVMS [facility] Hematology/Oncology Physician [facility]  .*Total Encounter Time as defined by the Centers for Medicare and Medicaid Services includes, in addition to the face-to-face time of a patient visit (documented in the note above) non-face-to-face time: obtaining and reviewing outside history, ordering and reviewing medications, tests or procedures, care coordination (communications with other health care professionals or caregivers) and documentation in the medical record.   I,Emily Lagle,acting as a Neurosurgeon for Emaline Onesimo, MD.,have documented all relevant documentation on the behalf of Emaline Onesimo, MD,as directed by  Emaline Onesimo, MD while in the presence of Emaline Onesimo, MD.  I have reviewed the above documentation for accuracy and completeness, and I agree with the above.  Yuepheng Schaller, MD

## 2023-12-23 NOTE — Transitions of Care (Post Inpatient/ED Visit) (Signed)
 12/23/2023  Name: Samantha [name] MRN: [id number] DOB: 1940-05-22  Today's TOC FU Call Status: Today's TOC FU Call Status:: Successful TOC FU Call Completed TOC FU Call Complete Date: 12/23/23 Patient's Name and Date of Birth confirmed.  Transition Care Management Follow-up Telephone Call Date of Discharge: 12/20/23 Discharge Facility: [facility]) Type of Discharge: Inpatient Admission Primary Inpatient Discharge Diagnosis:: colon cancer/ partial colectomy robot assisted laparoscopitc How have you been since you were released from the hospital?: Better (patient states she still has gas that needs to move.) Any questions or concerns?: No  Items Reviewed: Did you receive and understand the discharge instructions provided?: Yes Medications obtained,verified, and reconciled?: Yes (Medications Reviewed) Any new allergies since your discharge?: No Dietary orders reviewed?: Yes Type of Diet Ordered:: regular diet Do you have support at home?: Yes People in Home [RPT]: child(ren), adult Name of Support/Comfort Primary Source: Salomon [employer]  Medications Reviewed Today: Medications Reviewed Today     Reviewed by Jakye Mullens E, RN (Registered Nurse) on 12/23/23 at [id number]  Med List Status: <None>   Medication Order Taking? Sig Documenting Provider Last Dose Status Informant  acetaminophen  (TYLENOL ) 500 MG tablet [id number] Yes Take 1,000 mg by mouth every 8 (eight) hours as needed for moderate pain (pain score 4-6). [provider]  Active Self  albuterol  (VENTOLIN  HFA) 108 (90 Base) MCG/ACT inhaler [id number] Yes Inhale 2 puffs into the lungs every 6 (six) hours as needed for wheezing or shortness of breath. Malka Domino, MD  Active Self  ALPRAZolam  (XANAX ) 0.25 MG tablet [id number] Yes Take 1 tablet (0.25 mg total) by mouth daily as needed for anxiety. Dineen Rollene MATSU, FNP  Active Self  amiodarone  (PACERONE ) 200 MG tablet [id number] Yes Take 0.5 tablets (100 mg total)  by mouth daily. TAKE 1/2 TABLET EVERY MORNING Gollan, Timothy J, MD  Active Self  amLODipine  (NORVASC ) 2.5 MG tablet [id number] Yes Take 1 tablet (2.5 mg total) by mouth daily. Dineen Rollene MATSU, FNP  Active Self  apixaban  (ELIQUIS ) 5 MG TABS tablet [id number] Yes Take 1 tablet (5 mg total) by mouth 2 (two) times daily. Gollan, Timothy J, MD  Active Self  atorvastatin  (LIPITOR) 40 MG tablet [id number] Yes Take 1 tablet (40 mg total) by mouth daily. *NEED OFFICE VISIT* TAKE 1 TABLET EVERY DAY Dineen Rollene MATSU, FNP  Active Self  b complex vitamins capsule [id number]  Take 1 capsule by mouth daily.  Patient not taking: Reported on 12/23/2023   Onesimo Emaline Brink, MD  Active Self  fluticasone  furoate-vilanterol (BREO ELLIPTA ) 100-25 MCG/ACT AEPB [id number] Yes Inhale 1 puff into the lungs daily. Malka Domino, MD  Active Self  gabapentin  (NEURONTIN ) 100 MG capsule [id number] Yes TAKE 2 CAPSULES BY MOUTH 3 TIMES A DAY Arnett, Rollene MATSU, FNP  Active Self  metoprolol  succinate (TOPROL -XL) 50 MG 24 hr tablet [id number] Yes Take 0.5 tablets (25 mg total) by mouth daily. Dineen Rollene MATSU, FNP  Active Self  nitroGLYCERIN  (NITROSTAT ) 0.4 MG SL tablet [id number] Yes Place 1 tablet (0.4 mg total) under the tongue every 5 (five) minutes x 3 doses as needed for chest pain. Gollan, Timothy J, MD  Active Self  pantoprazole  (PROTONIX ) 40 MG tablet [id number] Yes Take 1 tablet (40 mg total) by mouth daily before breakfast. Cherlyn Labella, MD  Active Self  PARoxetine  (PAXIL ) 10 MG tablet [id number] Yes Take 1 tablet (10 mg total) by mouth daily. Dineen Rollene MATSU, FNP  Active Self  traMADol  (ULTRAM ) 50 MG tablet [id number]  Yes Take 1 tablet (50 mg total) by mouth every 6 (six) hours as needed. Debby Hila, MD  Active             Home Care and Equipment/Supplies: Were Home Health Services Ordered?: No Any new equipment or medical supplies ordered?: No  Functional Questionnaire: Do you need assistance with  bathing/showering or dressing?: Yes Do you need assistance with meal preparation?: Yes Do you need assistance with eating?: No Do you have difficulty maintaining continence: No Do you need assistance with getting out of bed/getting out of a chair/moving?: No Do you have difficulty managing or taking your medications?: No  Follow up appointments reviewed: PCP Follow-up appointment confirmed?: No (This RN case manager offered to schedule patient for a hospital follow up visit with her primary care provider. Patient states she will call and schedule the hospital follow up visit.) Specialist Hospital Follow-up appointment confirmed?: No Reason Specialist Follow-Up Not Confirmed: Patient has Specialist Provider Number and will Call for Appointment Do you need transportation to your follow-up appointment?: No Do you understand care options if your condition(s) worsen?: Yes-patient verbalized understanding  SDOH Interventions Today    Flowsheet Row Most Recent Value  SDOH Interventions   Food Insecurity Interventions Intervention Not Indicated  Housing Interventions Intervention Not Indicated  Transportation Interventions Intervention Not Indicated  Utilities Interventions Intervention Not Indicated   Discussed and offered 30 day TOC program.  Patient  declined.  The patient has been provided with contact information for the care management team and has been advised to call with any health -related questions or concerns.  The patient verbalized understanding with current plan of care.  The patient is directed to their insurance card regarding availability of benefits coverage.    Arvin Seip RN, BSN, CCM [employer], Population Health Case Manager Phone: [phone]

## 2023-12-23 NOTE — Patient Instructions (Signed)
 Visit Information  Thank you for taking time to visit with me today. Please don't hesitate to contact me if I can be of assistance to you  Patient instructions: Follow the instructions provided by your surgeon for wound care and reportable symptoms.   schedule follow up visit with her primary care provider and surgeon   take medications as prescribed and discussed managing her pain.   notify surgeon's office for any new and/ or ongoing symptoms such as uncontrolled pain, infection symptoms.   seek emergency medical treatment for severe symptoms such has SOB, chest pain, stroke like symptoms.    Patient verbalizes understanding of instructions and care plan provided today and agrees to view in MyChart. Active MyChart status and patient understanding of how to access instructions and care plan via MyChart confirmed with patient.     The patient has been provided with contact information for the care management team and has been advised to call with any health related questions or concerns.   Please call the care guide team at [phone] if you need to cancel or reschedule your appointment.   Please call the Suicide and Crisis Lifeline: 988 call the [place]  National Suicide Prevention Lifeline: (909) 854-3112 or TTY: 847-348-1208 TTY (540)523-8304) to talk to a trained counselor call 1-800-273-TALK (toll free, 24 hour hotline) if you are experiencing a Mental Health or Behavioral Health Crisis or need someone to talk to.  Arvin Seip RN, BSN, CCM [employer], Population Health Case Manager Phone: [phone]

## 2023-12-24 ENCOUNTER — Ambulatory Visit (INDEPENDENT_AMBULATORY_CARE_PROVIDER_SITE_OTHER): Admitting: Physician Assistant

## 2023-12-24 ENCOUNTER — Other Ambulatory Visit: Payer: Self-pay

## 2023-12-24 ENCOUNTER — Encounter: Payer: Self-pay | Admitting: Physician Assistant

## 2023-12-24 ENCOUNTER — Telehealth: Payer: Self-pay | Admitting: Hematology

## 2023-12-24 DIAGNOSIS — Z9889 Other specified postprocedural states: Secondary | ICD-10-CM

## 2023-12-24 DIAGNOSIS — Z8781 Personal history of (healed) traumatic fracture: Secondary | ICD-10-CM

## 2023-12-24 NOTE — Telephone Encounter (Signed)
 Samantha [name] has been contacted and made aware of her 3 month follow up appointment.

## 2023-12-24 NOTE — Progress Notes (Signed)
 Office Visit Note   Patient: Samantha [name]           Date of Birth: 1940/07/30           MRN: [id number] Visit Date: 12/24/2023              Requested by: Dineen Rollene MATSU, FNP [address] [place],  [place] [address] PCP: Dineen Rollene MATSU, FNP  Chief Complaint  Patient presents with   Right Ankle - Routine Post Op    11/20/2023 ORIF right ankle fx       HPI: 83 y/o female with right ankle ORIF.  She is 4 weeks post op.  On her last visit she had mild superficial  incision separation.  We were using Vashe wet to dry dressing daily.  Elevation for edema and NWB/TDWB in cam boot.   Of note she had a partial colectomy surgery and is doing well.   Assessment & Plan: Visit Diagnoses:  1. S/P ORIF (open reduction internal fixation) fracture     Plan: WBAT in the cam boot.  Elevation for edema PRN.  Continue Vashe cleaning of the incisions until fully healed.  May shower daily as needed.  She may remove the boot and do active ROM of the ankle.  Follow-Up Instructions: Return in about 2 weeks (around 01/07/2024) for repeat x ray right ankle.   Ortho Exam  Patient is alert, oriented, no adenopathy, well-dressed, normal affect, normal respiratory effort. Incisions are healing well without cellulitis or drainage.  Palpable pedal pulse.  Minimal edema.      Imaging: Good bone alignment and evidence of bony callus formation across the fracture sites.  No hardware lucency.    Labs: Lab Results  Component Value Date   HGBA1C 6.0 04/04/2023   HGBA1C 5.9 (A) 04/27/2022   HGBA1C 6.3 12/25/2021     Lab Results  Component Value Date   ALBUMIN 3.4 (L) 12/23/2023   ALBUMIN 3.6 12/12/2023   ALBUMIN 2.9 (L) 11/20/2023    Lab Results  Component Value Date   MG 1.9 04/09/2023   Lab Results  Component Value Date   VD25OH 16.77 (L) 04/04/2023   VD25OH 30.08 12/25/2021   VD25OH 25.10 (L) 08/23/2021    No results found for: PREALBUMIN    Latest Ref Rng &  Units 12/23/2023   11:27 AM 12/20/2023    4:28 AM 12/19/2023    4:25 AM  CBC EXTENDED  WBC 4.0 - 10.5 K/uL 14.9  13.7  15.8   RBC 3.87 - 5.11 MIL/uL 4.32  3.60  4.25   Hemoglobin 12.0 - 15.0 g/dL 87.0  89.1  87.5   HCT 36.0 - 46.0 % 40.2  34.9  40.9   Platelets 150 - 400 K/uL 342  206  240   NEUT# 1.7 - 7.7 K/uL 10.2     Lymph# 0.7 - 4.0 K/uL 2.7        There is no height or weight on file to calculate BMI.  Orders:  Orders Placed This Encounter  Procedures   XR Ankle Complete Right   No orders of the defined types were placed in this encounter.    Procedures: No procedures performed  Clinical Data: No additional findings.  ROS:  All other systems negative, except as noted in the HPI. Review of Systems  Objective: Vital Signs: There were no vitals taken for this visit.  Specialty Comments:  No specialty comments available.  PMFS History: Patient Active Problem List  Diagnosis Date Noted   Cancer of transverse colon (HCC) 12/18/2023   Closed fracture of right ankle 11/20/2023   Abdominal pain 07/17/2023   IDA (iron  deficiency anemia) 05/27/2023   Diverticulosis of colon without hemorrhage 04/10/2023   Hematochezia 04/10/2023   Acute on chronic anemia 04/10/2023   Multifocal pneumonia 04/08/2023   GI bleed 04/08/2023   Excessive sweating 10/30/2022   Status post insertion of spinal cord stimulator 08/07/2022   Left foot pain 07/06/2022   Lumbar post-laminectomy syndrome 03/08/2022   Radicular pain of left lower extremity 03/08/2022   Prediabetes 12/25/2021   COPD GOLD 2 / still smoking  12/13/2020   Cigarette smoker 12/13/2020   B12 deficiency 10/19/2020   Tinea pedis 10/19/2020   Mucus plugging of bronchi 09/02/2020   Pulmonary artery hypertension (HCC) 09/02/2020   Coronary artery disease involving native coronary artery of native heart without angina pectoris 09/02/2020   Abnormal CT scan of lung 09/02/2020   Ground glass opacity present on imaging  of lung 09/01/2020   Atherosclerosis of aorta 08/23/2020   SOB (shortness of breath) 08/22/2020   HLD (hyperlipidemia) 06/22/2020   Hoarseness 07/25/2018   Intermittent atrial fibrillation (HCC) 05/30/2018   Lumbar foraminal stenosis 04/25/2018   HTN (hypertension) 03/26/2018   Anxiety and depression 03/26/2018   Routine physical examination 03/26/2018   Chronic low back pain 03/26/2018   Follicular lymphoma of lymph nodes of multiple regions (HCC) 01/30/2016   DDD (degenerative disc disease), lumbar 05/31/2015   Past Medical History:  Diagnosis Date   A-fib (HCC)    Anemia    hx iron  infusions   Anxiety    Arthritis    Chronic back pain    Colon cancer (HCC) 10/2023   COPD (chronic obstructive pulmonary disease) (HCC)    Coronary artery disease    COVID-19    08/22/20 likely contracted late 07/2020   Depression    GERD (gastroesophageal reflux disease)    Glaucoma    bilateral   Hearing aid worn    High cholesterol    Hypertension    Lymphadenopathy 01/25/2016   Myocardial infarction (HCC)    2000   Non Hodgkin's lymphoma (HCC)    Pneumonia 2025   x 1   Pre-diabetes    Wears dentures     Top plate   Wears glasses     Family History  Problem Relation Age of Onset   Hypertension Mother    Stroke Mother    Heart disease Father    Heart disease Other     Past Surgical History:  Procedure Laterality Date   CARDIAC CATHETERIZATION     CATARACT EXTRACTION W/ INTRAOCULAR LENS IMPLANT     CHOLECYSTECTOMY     COLONOSCOPY N/A 10/10/2023   Procedure: COLONOSCOPY;  Surgeon: Unk Corinn Skiff, MD;  Location: [facility] ENDOSCOPY;  Service: Gastroenterology;  Laterality: N/A;   COLONOSCOPY W/ BIOPSIES AND POLYPECTOMY     CORONARY STENT PLACEMENT     ESOPHAGOGASTRODUODENOSCOPY N/A 10/10/2023   Procedure: EGD (ESOPHAGOGASTRODUODENOSCOPY);  Surgeon: Unk Corinn Skiff, MD;  Location[facility] ENDOSCOPY;  Service: Gastroenterology;  Laterality: N/A;   INGUINAL LYMPH NODE BIOPSY  Right 01/25/2016   Procedure: EXCISION DEEP RIGHT INGUINAL LYMPH NODE BIOPSY WITH ULTRA SOUND;  Surgeon: Elon Pacini, MD;  Location: [facility] OR;  Service: General;  Laterality: Right;   ORIF ANKLE FRACTURE Right 11/20/2023   Procedure: OPEN REDUCTION INTERNAL FIXATION (ORIF) ANKLE FRACTURE;  Surgeon: Harden Jerona GAILS, MD;  Location: [facility] OR;  Service: Orthopedics;  Laterality: Right;   POLYPECTOMY  10/10/2023   Procedure: POLYPECTOMY, INTESTINE;  Surgeon: Unk Corinn Skiff, MD;  Location: [facility] ENDOSCOPY;  Service: Gastroenterology;;   SPINAL CORD STIMULATOR IMPLANT  04/20/2022   SPINE SURGERY     Social History   Occupational History   Not on file  Tobacco Use   Smoking status: Every Day    Current packs/day: 0.50    Average packs/day: 0.5 packs/day for 60.0 years (30.0 ttl pk-yrs)    Types: Cigarettes   Smokeless tobacco: Never   Tobacco comments:    1/2 ppd--04/23/2023  Vaping Use   Vaping status: Never Used  Substance and Sexual Activity   Alcohol use: Yes    Comment: social - mixed drink   Drug use: No   Sexual activity: Not Currently    Birth control/protection: Post-menopausal

## 2023-12-30 NOTE — Progress Notes (Signed)
 Per Dr Onesimo requesting BRAF mutation testing , MLH1 methylation testing iand MSI testing to determine if MLH1 mutation represents a sporadic tumor or germline line mutation. From pathology. Email request sent.

## 2024-01-06 ENCOUNTER — Encounter: Payer: Self-pay | Admitting: Physician Assistant

## 2024-01-06 ENCOUNTER — Other Ambulatory Visit: Payer: Self-pay

## 2024-01-06 ENCOUNTER — Ambulatory Visit (INDEPENDENT_AMBULATORY_CARE_PROVIDER_SITE_OTHER): Admitting: Physician Assistant

## 2024-01-06 DIAGNOSIS — Z8781 Personal history of (healed) traumatic fracture: Secondary | ICD-10-CM | POA: Diagnosis not present

## 2024-01-06 DIAGNOSIS — Z9889 Other specified postprocedural states: Secondary | ICD-10-CM

## 2024-01-06 NOTE — Progress Notes (Signed)
 Office Visit Note   Patient: Samantha [name]           Date of Birth: 08-07-1940           MRN: [id number] Visit Date: 01/06/2024              Requested by: Dineen Rollene MATSU, FNP [address] [place],  [place] [address] PCP: Dineen Rollene MATSU, FNP  Chief Complaint  Patient presents with   Right Ankle - Follow-up    ORIF right ankle 11/20/2023      HPI: 83 y/o female who under went ORIF of the ankle on 11/20/23.  Elevation for edema and NWB/TDWB in cam boot.   Of note she had a partial colectomy surgery and is doing well.  On her last visit she was given permission to [facility] in the cam boot.  Elevation PRN for edema.  And remove the boot for active ROM of the ankle.  She denies pain and has had very little swelling in the ankle.  Assessment & Plan: Visit Diagnoses:  1. S/P ORIF (open reduction internal fixation) fracture     Plan: WBAT in a stiff tennis shoe.  Continue with ankle ROM, elevation as needed for swelling.  Gradually increase activity.  If she has problems or concerns she will call.  We will see if she needs PT in the future she will call.  Follow-Up Instructions: Return if symptoms worsen or fail to improve.   Ortho Exam  Patient is alert, oriented, no adenopathy, well-dressed, normal affect, normal respiratory effort. Palpable DP, good ankle motion to 3-5 degrees of dorsiflexion, 30 degrees of plantar flexion.  Incision are fully healed without cellulitis.  Supination and pronation.  She chronic right LE edema from a previous injury to the right knee.  Non pitting edema in the lower right leg.    Imaging:   Labs: Lab Results  Component Value Date   HGBA1C 6.0 04/04/2023   HGBA1C 5.9 (A) 04/27/2022   HGBA1C 6.3 12/25/2021     Lab Results  Component Value Date   ALBUMIN 3.4 (L) 12/23/2023   ALBUMIN 3.6 12/12/2023   ALBUMIN 2.9 (L) 11/20/2023    Lab Results  Component Value Date   MG 1.9 04/09/2023   Lab Results  Component Value Date    VD25OH 16.77 (L) 04/04/2023   VD25OH 30.08 12/25/2021   VD25OH 25.10 (L) 08/23/2021    No results found for: PREALBUMIN    Latest Ref Rng & Units 12/23/2023   11:27 AM 12/20/2023    4:28 AM 12/19/2023    4:25 AM  CBC EXTENDED  WBC 4.0 - 10.5 K/uL 14.9  13.7  15.8   RBC 3.87 - 5.11 MIL/uL 4.32  3.60  4.25   Hemoglobin 12.0 - 15.0 g/dL 87.0  89.1  87.5   HCT 36.0 - 46.0 % 40.2  34.9  40.9   Platelets 150 - 400 K/uL 342  206  240   NEUT# 1.7 - 7.7 K/uL 10.2     Lymph# 0.7 - 4.0 K/uL 2.7        There is no height or weight on file to calculate BMI.  Orders:  Orders Placed This Encounter  Procedures   XR Ankle Complete Right   No orders of the defined types were placed in this encounter.    Procedures: No procedures performed  Clinical Data: No additional findings.  ROS:  All other systems negative, except as noted in the HPI. Review  of Systems  Objective: Vital Signs: There were no vitals taken for this visit.  Specialty Comments:  No specialty comments available.  PMFS History: Patient Active Problem List   Diagnosis Date Noted   Cancer of transverse colon (HCC) 12/18/2023   Closed fracture of right ankle 11/20/2023   Abdominal pain 07/17/2023   IDA (iron  deficiency anemia) 05/27/2023   Diverticulosis of colon without hemorrhage 04/10/2023   Hematochezia 04/10/2023   Acute on chronic anemia 04/10/2023   Multifocal pneumonia 04/08/2023   GI bleed 04/08/2023   Excessive sweating 10/30/2022   Status post insertion of spinal cord stimulator 08/07/2022   Left foot pain 07/06/2022   Lumbar post-laminectomy syndrome 03/08/2022   Radicular pain of left lower extremity 03/08/2022   Prediabetes 12/25/2021   COPD GOLD 2 / still smoking  12/13/2020   Cigarette smoker 12/13/2020   B12 deficiency 10/19/2020   Tinea pedis 10/19/2020   Mucus plugging of bronchi 09/02/2020   Pulmonary artery hypertension (HCC) 09/02/2020   Coronary artery disease involving native  coronary artery of native heart without angina pectoris 09/02/2020   Abnormal CT scan of lung 09/02/2020   Ground glass opacity present on imaging of lung 09/01/2020   Atherosclerosis of aorta 08/23/2020   SOB (shortness of breath) 08/22/2020   HLD (hyperlipidemia) 06/22/2020   Hoarseness 07/25/2018   Intermittent atrial fibrillation (HCC) 05/30/2018   Lumbar foraminal stenosis 04/25/2018   HTN (hypertension) 03/26/2018   Anxiety and depression 03/26/2018   Routine physical examination 03/26/2018   Chronic low back pain 03/26/2018   Follicular lymphoma of lymph nodes of multiple regions (HCC) 01/30/2016   DDD (degenerative disc disease), lumbar 05/31/2015   Past Medical History:  Diagnosis Date   A-fib (HCC)    Anemia    hx iron  infusions   Anxiety    Arthritis    Chronic back pain    Colon cancer (HCC) 10/2023   COPD (chronic obstructive pulmonary disease) (HCC)    Coronary artery disease    COVID-19    08/22/20 likely contracted late 07/2020   Depression    GERD (gastroesophageal reflux disease)    Glaucoma    bilateral   Hearing aid worn    High cholesterol    Hypertension    Lymphadenopathy 01/25/2016   Myocardial infarction (HCC)    2000   Non Hodgkin's lymphoma (HCC)    Pneumonia 2025   x 1   Pre-diabetes    Wears dentures     Top plate   Wears glasses     Family History  Problem Relation Age of Onset   Hypertension Mother    Stroke Mother    Heart disease Father    Heart disease Other     Past Surgical History:  Procedure Laterality Date   CARDIAC CATHETERIZATION     CATARACT EXTRACTION W/ INTRAOCULAR LENS IMPLANT     CHOLECYSTECTOMY     COLONOSCOPY N/A 10/10/2023   Procedure: COLONOSCOPY;  Surgeon: Unk Corinn Skiff, MD;  Location: [facility] ENDOSCOPY;  Service: Gastroenterology;  Laterality: N/A;   COLONOSCOPY W/ BIOPSIES AND POLYPECTOMY     CORONARY STENT PLACEMENT     ESOPHAGOGASTRODUODENOSCOPY N/A 10/10/2023   Procedure: EGD  (ESOPHAGOGASTRODUODENOSCOPY);  Surgeon: Unk Corinn Skiff, MD;  Location: [facility] ENDOSCOPY;  Service: Gastroenterology;  Laterality: N/A;   INGUINAL LYMPH NODE BIOPSY Right 01/25/2016   Procedure: EXCISION DEEP RIGHT INGUINAL LYMPH NODE BIOPSY WITH ULTRA SOUND;  Surgeon: Elon Pacini, MD;  Location: [facility] OR;  Service: General;  Laterality:  Right;   ORIF ANKLE FRACTURE Right 11/20/2023   Procedure: OPEN REDUCTION INTERNAL FIXATION (ORIF) ANKLE FRACTURE;  Surgeon: Harden Jerona GAILS, MD;  Location: [facility] OR;  Service: Orthopedics;  Laterality: Right;   POLYPECTOMY  10/10/2023   Procedure: POLYPECTOMY, INTESTINE;  Surgeon: Unk Corinn Skiff, MD;  Location: [facility] ENDOSCOPY;  Service: Gastroenterology;;   SPINAL CORD STIMULATOR IMPLANT  04/20/2022   SPINE SURGERY     Social History   Occupational History   Not on file  Tobacco Use   Smoking status: Every Day    Current packs/day: 0.50    Average packs/day: 0.5 packs/day for 60.0 years (30.0 ttl pk-yrs)    Types: Cigarettes   Smokeless tobacco: Never   Tobacco comments:    1/2 ppd--04/23/2023  Vaping Use   Vaping status: Never Used  Substance and Sexual Activity   Alcohol use: Yes    Comment: social - mixed drink   Drug use: No   Sexual activity: Not Currently    Birth control/protection: Post-menopausal

## 2024-01-10 ENCOUNTER — Encounter (HOSPITAL_COMMUNITY): Payer: Self-pay | Admitting: Hematology

## 2024-01-13 ENCOUNTER — Encounter: Payer: Self-pay | Admitting: Family

## 2024-01-13 ENCOUNTER — Ambulatory Visit: Admitting: Family

## 2024-01-13 ENCOUNTER — Ambulatory Visit: Payer: Self-pay | Admitting: Family

## 2024-01-13 VITALS — BP 136/64 | HR 60 | Temp 98.7°F | Ht 69.0 in | Wt 183.0 lb

## 2024-01-13 DIAGNOSIS — M79672 Pain in left foot: Secondary | ICD-10-CM

## 2024-01-13 DIAGNOSIS — C184 Malignant neoplasm of transverse colon: Secondary | ICD-10-CM

## 2024-01-13 DIAGNOSIS — R7309 Other abnormal glucose: Secondary | ICD-10-CM

## 2024-01-13 DIAGNOSIS — I10 Essential (primary) hypertension: Secondary | ICD-10-CM

## 2024-01-13 DIAGNOSIS — R61 Generalized hyperhidrosis: Secondary | ICD-10-CM | POA: Diagnosis not present

## 2024-01-13 DIAGNOSIS — F419 Anxiety disorder, unspecified: Secondary | ICD-10-CM | POA: Diagnosis not present

## 2024-01-13 DIAGNOSIS — Z23 Encounter for immunization: Secondary | ICD-10-CM

## 2024-01-13 DIAGNOSIS — F32A Depression, unspecified: Secondary | ICD-10-CM | POA: Diagnosis not present

## 2024-01-13 DIAGNOSIS — F325 Major depressive disorder, single episode, in full remission: Secondary | ICD-10-CM | POA: Diagnosis not present

## 2024-01-13 LAB — POCT GLYCOSYLATED HEMOGLOBIN (HGB A1C): Hemoglobin A1C: 5.5 % (ref 4.0–5.6)

## 2024-01-13 MED ORDER — ATORVASTATIN CALCIUM 40 MG PO TABS: 40.0000 mg | ORAL_TABLET | Freq: Every day | ORAL | 3 refills | Status: AC

## 2024-01-13 MED ORDER — PAROXETINE HCL 10 MG PO TABS: 10.0000 mg | ORAL_TABLET | Freq: Every day | ORAL | 3 refills | Status: AC

## 2024-01-13 MED ORDER — GABAPENTIN 100 MG PO CAPS: 200.0000 mg | ORAL_CAPSULE | Freq: Three times a day (TID) | ORAL | 1 refills | Status: DC

## 2024-01-13 MED ORDER — ALPRAZOLAM 0.25 MG PO TABS: 0.2500 mg | ORAL_TABLET | Freq: Every day | ORAL | 2 refills | Status: AC | PRN

## 2024-01-13 NOTE — Assessment & Plan Note (Signed)
 Chronic, stable.  Continue  Paxil  10 mg every day,Xanax  0.25 mg qd.She understands the risks of BZDs. She will also store in safe place.

## 2024-01-13 NOTE — Assessment & Plan Note (Signed)
 S/p colectomy. Following with oncology and surgery. Will follow.

## 2024-01-13 NOTE — Assessment & Plan Note (Signed)
Well controlled. Compliant with toprol  qd, amlodipine 2.5 mg qd.

## 2024-01-13 NOTE — Progress Notes (Signed)
 Assessment & Plan:  Need for influenza vaccination -     Flu vaccine HIGH DOSE PF(Fluzone Trivalent)  Anxiety -     ALPRAZolam ; Take 1 tablet (0.25 mg total) by mouth daily as needed for anxiety.  Dispense: 30 tablet; Refill: 2  Primary hypertension Assessment & Plan: Well controlled. Compliant with toprol  50mg  qd, amlodipine  2.5 mg qd.   Orders: -     Atorvastatin  Calcium ; Take 1 tablet (40 mg total) by mouth daily. *NEED OFFICE VISIT* TAKE 1 TABLET EVERY DAY  Dispense: 90 tablet; Refill: 3  Left foot pain -     Gabapentin ; Take 2 capsules (200 mg total) by mouth 3 (three) times daily.  Dispense: 270 capsule; Refill: 1  Depression, major, single episode, complete remission -     PARoxetine  HCl; Take 1 tablet (10 mg total) by mouth daily.  Dispense: 90 tablet; Refill: 3  Excessive sweating -     PARoxetine  HCl; Take 1 tablet (10 mg total) by mouth daily.  Dispense: 90 tablet; Refill: 3  Elevated glucose -     POCT glycosylated hemoglobin (Hb A1C)  Anxiety and depression Assessment & Plan: Chronic, stable.  Continue  Paxil  10 mg every day,Xanax  0.25 mg qd.She understands the risks of BZDs. She will also store in safe place.     Cancer of transverse colon [facility]) Assessment & Plan: S/p colectomy. Following with oncology and surgery. Will follow.       Return precautions given.   Risks, benefits, and alternatives of the medications and treatment plan prescribed today were discussed, and patient expressed understanding.   Education regarding symptom management and diagnosis given to patient on AVS either electronically or printed.  Return in about 3 months (around 04/14/2024).  Rollene Northern, FNP  Subjective:    Patient ID: Samantha [name], female    DOB: [birth year], 83 y.o.   MRN: [id number]  CC: Samantha [name] is a 83 y.o. female who presents today for follow up.   HPI: Accompanied by cousin, Samantha [name]  Discussed the use of AI scribe software for clinical note  transcription with the patient, who gave verbal consent to proceed.  History of Present Illness   Samantha [name] is an 83 year old female who presents for a medication follow-up after recent ankle fracture and colectomy  She experienced a right ankle fracture after falling after getting off a golf cart and slipping on rocks, landing on her right side. She had a follow-up earlier this month and recently had her walking boot removed. She is now able to walk in tennis shoes but continues to use a cane for mobility and is driving again.  S/p colectomy December 18, 2023, and had a follow-up last week. She is not undergoing chemotherapy or radiation and is under surveillance for recurrence with follow-up appointments scheduled in December and January.   No family history of colon cancer.  She is currently taking alprazolam  0.25 mg once daily for anxiety and paroxetine  10 mg for depression and anxiety. She uses alprazolam  daily and feels her current medication doses are adequate.  She previously had an A1c of 6.0.   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 Pathology showed stage II disease. She has elected to not do any adjuvant chemotherapy. She will follow-up with oncology for surveillance.   ORIF of the right ankle on 11/20/23   Following with Dr Onesimo 12/23/23  Allergies: Hydrocodone and Oxycodone   Current Outpatient Medications on File Prior to Visit  Medication Sig Dispense Refill   acetaminophen  (TYLENOL ) 500 MG tablet Take 1,000 mg by mouth every 8 (eight) hours as needed for moderate pain (pain score 4-6).     albuterol  (VENTOLIN  HFA) 108 (90 Base) MCG/ACT inhaler Inhale 2 puffs into the lungs every 6 (six) hours as needed for wheezing or shortness of breath. 8 g 2   amiodarone  (PACERONE ) 200 MG tablet Take 0.5 tablets (100 mg total) by mouth daily. TAKE 1/2 TABLET EVERY MORNING 45 tablet 3   amLODipine  (NORVASC ) 2.5 MG tablet Take 1  tablet (2.5 mg total) by mouth daily. 90 tablet 3   apixaban  (ELIQUIS ) 5 MG TABS tablet Take 1 tablet (5 mg total) by mouth 2 (two) times daily. 180 tablet 1   b complex vitamins capsule Take 1 capsule by mouth daily. 30 capsule 5   fluticasone  furoate-vilanterol (BREO ELLIPTA ) 100-25 MCG/ACT AEPB Inhale 1 puff into the lungs daily. 90 each 3   metoprolol  succinate (TOPROL -XL) 50 MG 24 hr tablet Take 0.5 tablets (25 mg total) by mouth daily. 90 tablet 3   nitroGLYCERIN  (NITROSTAT ) 0.4 MG SL tablet Place 1 tablet (0.4 mg total) under the tongue every 5 (five) minutes x 3 doses as needed for chest pain. 25 tablet 0   pantoprazole  (PROTONIX ) 40 MG tablet Take 1 tablet (40 mg total) by mouth daily before breakfast. 30 tablet 1   traMADol  (ULTRAM ) 50 MG tablet Take 1 tablet (50 mg total) by mouth every 6 (six) hours as needed. 30 tablet 0   No current facility-administered medications on file prior to visit.    Review of Systems  Constitutional:  Negative for chills and fever.  Respiratory:  Negative for cough.   Cardiovascular:  Negative for chest pain and palpitations.  Gastrointestinal:  Negative for nausea and vomiting.      Objective:    BP 136/64   Pulse 60   Temp 98.7 F (37.1 C) (Oral)   Ht 5' 9 (1.753 m)   Wt 183 lb (83 kg)   SpO2 93%   BMI 27.02 kg/m  BP Readings from Last 3 Encounters:  01/13/24 136/64  12/23/23 (!) 102/44  12/20/23 (!) 147/55   Wt Readings from Last 3 Encounters:  01/13/24 183 lb (83 kg)  12/23/23 183 lb 9.6 oz (83.3 kg)  12/20/23 175 lb 14.8 oz (79.8 kg)    Physical Exam Vitals reviewed.  Constitutional:      Appearance: She is well-developed.  Eyes:     Conjunctiva/sclera: Conjunctivae normal.  Cardiovascular:     Rate and Rhythm: Normal rate and regular rhythm.     Pulses: Normal pulses.     Heart sounds: Normal heart sounds.  Pulmonary:     Effort: Pulmonary effort is normal.     Breath sounds: Normal breath sounds. No wheezing,  rhonchi or rales.  Skin:    General: Skin is warm and dry.  Neurological:     Mental Status: She is alert.  Psychiatric:        Speech: Speech normal.        Behavior: Behavior normal.        Thought Content: Thought content normal.

## 2024-01-13 NOTE — Patient Instructions (Signed)
 Nice to see you!

## 2024-01-14 ENCOUNTER — Encounter (HOSPITAL_COMMUNITY): Payer: Self-pay | Admitting: Hematology

## 2024-01-16 DIAGNOSIS — H40153 Residual stage of open-angle glaucoma, bilateral: Secondary | ICD-10-CM | POA: Diagnosis not present

## 2024-01-17 ENCOUNTER — Encounter (HOSPITAL_COMMUNITY): Payer: Self-pay | Admitting: Hematology

## 2024-01-20 ENCOUNTER — Encounter: Payer: Self-pay | Admitting: Radiology

## 2024-01-20 ENCOUNTER — Ambulatory Visit: Admitting: Family

## 2024-01-27 ENCOUNTER — Ambulatory Visit: Admitting: Pulmonary Disease

## 2024-02-27 ENCOUNTER — Ambulatory Visit: Admitting: Family

## 2024-03-23 ENCOUNTER — Other Ambulatory Visit: Payer: Self-pay

## 2024-03-23 DIAGNOSIS — C189 Malignant neoplasm of colon, unspecified: Secondary | ICD-10-CM

## 2024-03-24 ENCOUNTER — Inpatient Hospital Stay: Payer: Self-pay | Attending: Hematology

## 2024-03-24 ENCOUNTER — Inpatient Hospital Stay: Admitting: Hematology

## 2024-03-24 VITALS — BP 142/68 | HR 58 | Temp 97.7°F | Resp 20 | Wt 185.7 lb

## 2024-03-24 DIAGNOSIS — Z8572 Personal history of non-Hodgkin lymphomas: Secondary | ICD-10-CM | POA: Insufficient documentation

## 2024-03-24 DIAGNOSIS — Z9049 Acquired absence of other specified parts of digestive tract: Secondary | ICD-10-CM | POA: Insufficient documentation

## 2024-03-24 DIAGNOSIS — C8298 Follicular lymphoma, unspecified, lymph nodes of multiple sites: Secondary | ICD-10-CM

## 2024-03-24 DIAGNOSIS — D500 Iron deficiency anemia secondary to blood loss (chronic): Secondary | ICD-10-CM

## 2024-03-24 DIAGNOSIS — C189 Malignant neoplasm of colon, unspecified: Secondary | ICD-10-CM

## 2024-03-24 DIAGNOSIS — C184 Malignant neoplasm of transverse colon: Secondary | ICD-10-CM | POA: Insufficient documentation

## 2024-03-24 LAB — CEA (ACCESS): CEA (CHCC): 2.85 ng/mL (ref 0.00–5.00)

## 2024-03-24 LAB — IRON AND IRON BINDING CAPACITY (CC-WL,HP ONLY)
Iron: 85 ug/dL (ref 28–170)
Saturation Ratios: 24 % (ref 10.4–31.8)
TIBC: 360 ug/dL (ref 250–450)
UIBC: 275 ug/dL

## 2024-03-24 LAB — CBC WITH DIFFERENTIAL (CANCER CENTER ONLY)
Abs Immature Granulocytes: 0.02 K/uL (ref 0.00–0.07)
Basophils Absolute: 0.1 K/uL (ref 0.0–0.1)
Basophils Relative: 1 %
Eosinophils Absolute: 0.2 K/uL (ref 0.0–0.5)
Eosinophils Relative: 2 %
HCT: 41.8 % (ref 36.0–46.0)
Hemoglobin: 13.6 g/dL (ref 12.0–15.0)
Immature Granulocytes: 0 %
Lymphocytes Relative: 29 %
Lymphs Abs: 2.6 K/uL (ref 0.7–4.0)
MCH: 29.5 pg (ref 26.0–34.0)
MCHC: 32.5 g/dL (ref 30.0–36.0)
MCV: 90.7 fL (ref 80.0–100.0)
Monocytes Absolute: 0.7 K/uL (ref 0.1–1.0)
Monocytes Relative: 7 %
Neutro Abs: 5.4 K/uL (ref 1.7–7.7)
Neutrophils Relative %: 61 %
Platelet Count: 237 K/uL (ref 150–400)
RBC: 4.61 MIL/uL (ref 3.87–5.11)
RDW: 14.4 % (ref 11.5–15.5)
WBC Count: 8.9 K/uL (ref 4.0–10.5)
nRBC: 0 % (ref 0.0–0.2)

## 2024-03-24 LAB — CMP (CANCER CENTER ONLY)
ALT: 12 U/L (ref 0–44)
AST: 22 U/L (ref 15–41)
Albumin: 4.1 g/dL (ref 3.5–5.0)
Alkaline Phosphatase: 76 U/L (ref 38–126)
Anion gap: 8 (ref 5–15)
BUN: 13 mg/dL (ref 8–23)
CO2: 32 mmol/L (ref 22–32)
Calcium: 9.3 mg/dL (ref 8.9–10.3)
Chloride: 103 mmol/L (ref 98–111)
Creatinine: 0.8 mg/dL (ref 0.44–1.00)
GFR, Estimated: >60 mL/min
Glucose, Bld: 107 mg/dL — ABNORMAL HIGH (ref 70–99)
Potassium: 3.9 mmol/L (ref 3.5–5.1)
Sodium: 143 mmol/L (ref 135–145)
Total Bilirubin: 0.5 mg/dL (ref 0.0–1.2)
Total Protein: 7.2 g/dL (ref 6.5–8.1)

## 2024-03-24 LAB — LACTATE DEHYDROGENASE: LDH: 191 U/L (ref 105–235)

## 2024-03-24 LAB — FERRITIN: Ferritin: 52 ng/mL (ref 11–307)

## 2024-03-24 NOTE — Progress Notes (Signed)
 " HEMATOLOGY ONCOLOGY PROGRESS NOTE  Date of service: 03/24/2024  Patient Care Team: Dineen Rollene MATSU, FNP as PCP - General (Family Medicine) Perla, Evalene PARAS, MD as PCP - Cardiology (Cardiology)  CHIEF COMPLAINT/PURPOSE OF CONSULTATION: Follow-up for continued evaluation and management of newly diagnosed Colon Cancer, recently s/p Partial Right Colectomy    HISTORY OF PRESENTING ILLNESS: Samantha [name] is a wonderful 84 y.o. female who has been referred to us  by Dr. Rollene Dineen for evaluation and management of recurrence of lymphoma. Patient was having excessive night sweats and saw her PCP. She had an CT chest and abdomen on 10/20/2022 which shows concerns for recurrence.    Patient previously used to follow-up with me for follicular lymphoma of lymph nodes of multiple regions and was last seen by me on 07/24/2019. Patient was suppose to follow-up with us , but did not follow-up. Patient was treated with 5 cycles of Bendamustine  and Rituxan , with her last treatment in June 2018. PET scan and CT scans after 2018 did not show any disease progression.    Patient is accompanied by her son during this visit. She notes she has been doing well overall for the past 6 months. She notes she has been having drenching night sweats for around 1-2 months. She denies any recent infections or any respiratory symptoms. Patient notes she is not been having night sweats for the past 2 weeks.    Patient notes she has been taking Gabapentin  for a long time and has been tolerating it well without any new or severe toxicities.    She denies any recent steroid usage or any other medicine changes in the past 6 months. She is complaint with all of her medicine. She denies taking any allergy medication.    She denies any new infection issues, fever, chills, unexpected weight loss, chest pain, abdominal pain, back pain, or leg swelling. She does complain of mild SOB and mild occasional fatigue. Patient complains of  more frequent bathroom usage, but denies diarrhea. She denies black stool or blood in stool.    During the physical examination, patient complained of mild lower left abdominal discomfort. Denies pain.    Patient notes she used to have hot flashes and went to her PCP. She was prescribed Paxil  10 mg which has improved her hot flashes and nightsweats. She denies hot flashes during this visit.     Patient notes she smokes little less than half pack a day.    She reports of mild change in appetite, but denies any weight loss.    (10/30/2023) Samantha [name] is here with her son for evaluation and management of newly diagnosed colon cancer. Patient was having iron  deficiency anemia and was recommended to get a GI workup. She had a colonoscopy and endoscopy in 09/20/2023.  With Dr. Unk. Colonoscopy showed Two medium- sized angioectasias without bleeding were found in the cecum. Coagulation for hemostasis using argon plasma was successful. An infiltrative, submucosal and ulcerated non- obstructing large mass was found in the descending colon and at 60 cm proximal to the anus. The mass was partially circumferential ( involving one- half of the lumen circumference) . No bleeding was present. Biopsies were taken with a cold forceps for histology. Biopsy of the colonic mass showed well to moderately differentiated adenocarcinoma of the colon in the descending colon. Patient currently denies any overt GI bleeding.  No abdominal pain or distention. She continues to smoke at least a pack a day. Labs on today were reviewed  and detailed with her.  INTERVAL HISTORY: Samantha [name] is a 84 y.o. female who is here today for continued evaluation and management of newly diagnosed Colon Cancer. S/p Partial Right Colectomy & Extending Transverse Colon Resection 12/18/2023. She is ambulating with a cane today.  she was last seen by me on 12/23/2023; at the time she mentioned experiencing pain while passing gas, but  otherwise was doing well.   Today, she recalls having broken her right ankle right before her colon resection. She says that she been well and having regular bowel movement. Denies any abdominal pain or new lumps/bumps. She reports that she is still smoking.   Endorses soreness in her arms bilaterally.   REVIEW OF SYSTEMS:   10 Point review of systems of done and is negative except as noted above.  MEDICAL HISTORY Past Medical History:  Diagnosis Date   A-fib (HCC)    Anemia    hx iron  infusions   Anxiety    Arthritis    Chronic back pain    Colon cancer (HCC) 10/2023   COPD (chronic obstructive pulmonary disease) (HCC)    Coronary artery disease    COVID-19    08/22/20 likely contracted late 07/2020   Depression    GERD (gastroesophageal reflux disease)    Glaucoma    bilateral   Hearing aid worn    High cholesterol    Hypertension    Lymphadenopathy 01/25/2016   Myocardial infarction (HCC)    2000   Non Hodgkin's lymphoma (HCC)    Pneumonia 2025   x 1   Pre-diabetes    Wears dentures     Top plate   Wears glasses     SURGICAL HISTORY Past Surgical History:  Procedure Laterality Date   CARDIAC CATHETERIZATION     CATARACT EXTRACTION W/ INTRAOCULAR LENS IMPLANT     CHOLECYSTECTOMY     COLONOSCOPY N/A 10/10/2023   Procedure: COLONOSCOPY;  Surgeon: Unk Corinn Skiff, MD;  Location: [facility] ENDOSCOPY;  Service: Gastroenterology;  Laterality: N/A;   COLONOSCOPY W/ BIOPSIES AND POLYPECTOMY     CORONARY STENT PLACEMENT     ESOPHAGOGASTRODUODENOSCOPY N/A 10/10/2023   Procedure: EGD (ESOPHAGOGASTRODUODENOSCOPY);  Surgeon: Unk Corinn Skiff, MD;  Location: [facility] ENDOSCOPY;  Service: Gastroenterology;  Laterality: N/A;   INGUINAL LYMPH NODE BIOPSY Right 01/25/2016   Procedure: EXCISION DEEP RIGHT INGUINAL LYMPH NODE BIOPSY WITH ULTRA SOUND;  Surgeon: Elon Pacini, MD;  Location: [facility] OR;  Service: General;  Laterality: Right;   ORIF ANKLE FRACTURE Right 11/20/2023    Procedure: OPEN REDUCTION INTERNAL FIXATION (ORIF) ANKLE FRACTURE;  Surgeon: Harden Jerona GAILS, MD;  Location: [facility] OR;  Service: Orthopedics;  Laterality: Right;   POLYPECTOMY  10/10/2023   Procedure: POLYPECTOMY, INTESTINE;  Surgeon: Unk Corinn Skiff, MD;  Location: [facility] ENDOSCOPY;  Service: Gastroenterology;;   SPINAL CORD STIMULATOR IMPLANT  04/20/2022   SPINE SURGERY      SOCIAL HISTORY Social History[1]  Social History   Social History Narrative   Not on file    SOCIAL DRIVERS OF HEALTH SDOH Screenings   Food Insecurity: No Food Insecurity (12/23/2023)  Housing: Unknown (12/23/2023)  Transportation Needs: No Transportation Needs (12/23/2023)  Utilities: Not At Risk (12/23/2023)  Alcohol Screen: Low Risk (09/30/2023)  Depression (PHQ2-9): Low Risk (01/13/2024)  Financial Resource Strain: Low Risk (09/30/2023)  Physical Activity: Inactive (09/30/2023)  Social Connections: Socially Isolated (12/18/2023)  Stress: No Stress Concern Present (09/30/2023)  Tobacco Use: High Risk (01/13/2024)  Health Literacy: Adequate Health Literacy (09/30/2023)  FAMILY HISTORY Family History  Problem Relation Age of Onset   Hypertension Mother    Stroke Mother    Heart disease Father    Heart disease Other      ALLERGIES: is allergic to hydrocodone and oxycodone .  MEDICATIONS  Current Outpatient Medications  Medication Sig Dispense Refill   acetaminophen  (TYLENOL ) 500 MG tablet Take 1,000 mg by mouth every 8 (eight) hours as needed for moderate pain (pain score 4-6).     albuterol  (VENTOLIN  HFA) 108 (90 Base) MCG/ACT inhaler Inhale 2 puffs into the lungs every 6 (six) hours as needed for wheezing or shortness of breath. 8 g 2   ALPRAZolam  (XANAX ) 0.25 MG tablet Take 1 tablet (0.25 mg total) by mouth daily as needed for anxiety. 30 tablet 2   amiodarone  (PACERONE ) 200 MG tablet Take 0.5 tablets (100 mg total) by mouth daily. TAKE 1/2 TABLET EVERY MORNING 45 tablet 3   amLODipine  (NORVASC )  2.5 MG tablet Take 1 tablet (2.5 mg total) by mouth daily. 90 tablet 3   apixaban  (ELIQUIS ) 5 MG TABS tablet Take 1 tablet (5 mg total) by mouth 2 (two) times daily. 180 tablet 1   atorvastatin  (LIPITOR) 40 MG tablet Take 1 tablet (40 mg total) by mouth daily. *NEED OFFICE VISIT* TAKE 1 TABLET EVERY DAY 90 tablet 3   b complex vitamins capsule Take 1 capsule by mouth daily. 30 capsule 5   fluticasone  furoate-vilanterol (BREO ELLIPTA ) 100-25 MCG/ACT AEPB Inhale 1 puff into the lungs daily. 90 each 3   gabapentin  (NEURONTIN ) 100 MG capsule Take 2 capsules (200 mg total) by mouth 3 (three) times daily. 270 capsule 1   metoprolol  succinate (TOPROL -XL) 50 MG 24 hr tablet Take 0.5 tablets (25 mg total) by mouth daily. 90 tablet 3   nitroGLYCERIN  (NITROSTAT ) 0.4 MG SL tablet Place 1 tablet (0.4 mg total) under the tongue every 5 (five) minutes x 3 doses as needed for chest pain. 25 tablet 0   pantoprazole  (PROTONIX ) 40 MG tablet Take 1 tablet (40 mg total) by mouth daily before breakfast. 30 tablet 1   PARoxetine  (PAXIL ) 10 MG tablet Take 1 tablet (10 mg total) by mouth daily. 90 tablet 3   traMADol  (ULTRAM ) 50 MG tablet Take 1 tablet (50 mg total) by mouth every 6 (six) hours as needed. 30 tablet 0   No current facility-administered medications for this visit.    PHYSICAL EXAMINATION: ECOG PERFORMANCE STATUS: 1 - Symptomatic but completely ambulatory VITALS: Vitals:   03/24/24 1226 03/24/24 1238  BP: (!) 164/51 (!) 142/68  Pulse: (!) 58   Resp: 20   Temp: 97.7 F (36.5 C)   SpO2: 96%    Filed Weights   03/24/24 1226  Weight: 185 lb 11.2 oz (84.2 kg)   Body mass index is 27.42 kg/m.  GENERAL: alert, in no acute distress and comfortable SKIN: no acute rashes, no significant lesions EYES: conjunctiva are pink and non-injected, sclera anicteric OROPHARYNX: MMM, no exudates, no oropharyngeal erythema or ulceration NECK: supple, no JVD LYMPH:  no palpable lymphadenopathy in the cervical,  axillary or inguinal regions LUNGS: clear to auscultation b/l with normal respiratory effort HEART: regular rate & rhythm ABDOMEN:  normoactive bowel sounds , non tender, not distended, no hepatosplenomegaly Extremity: no pedal edema PSYCH: alert & oriented x 3 with fluent speech NEURO: no focal motor/sensory deficits  LABORATORY DATA:   I have reviewed the data as listed     Latest Ref Rng & Units 03/24/2024  11:47 AM 12/23/2023   11:27 AM 12/20/2023    4:28 AM  CBC EXTENDED  WBC 4.0 - 10.5 K/uL 8.9  14.9  13.7   RBC 3.87 - 5.11 MIL/uL 4.61  4.32  3.60   Hemoglobin 12.0 - 15.0 g/dL 86.3  87.0  89.1   HCT 36.0 - 46.0 % 41.8  40.2  34.9   Platelets 150 - 400 K/uL 237  342  206   NEUT# 1.7 - 7.7 K/uL 5.4  10.2    Lymph# 0.7 - 4.0 K/uL 2.6  2.7     Iron  Studies:  Lab Results  Component Value Date   IRON  85 03/24/2024   UIBC 275 03/24/2024   TIBC 360 03/24/2024   IRONPCTSAT 24 03/24/2024   FERRITIN 210 12/23/2023   03/24/2024  CEA ([facility])     0.00 - 5.00 ng/mL 2.85     LDH:  Lab Results  Component Value Date   LDH 191 03/24/2024      Latest Ref Rng & Units 03/24/2024   11:47 AM 12/23/2023   11:27 AM 12/20/2023    4:28 AM  CMP  Glucose 70 - 99 mg/dL 892  892  891   BUN 8 - 23 mg/dL 13  8  10    Creatinine 0.44 - 1.00 mg/dL 9.19  9.10  9.29   Sodium 135 - 145 mmol/L 143  143  143   Potassium 3.5 - 5.1 mmol/L 3.9  4.0  3.6   Chloride 98 - 111 mmol/L 103  104  105   CO2 22 - 32 mmol/L 32  31  28   Calcium  8.9 - 10.3 mg/dL 9.3  9.6  8.8   Total Protein 6.5 - 8.1 g/dL 7.2  6.8    Total Bilirubin 0.0 - 1.2 mg/dL 0.5  0.4    Alkaline Phos 38 - 126 U/L 76  67    AST 15 - 41 U/L 22  15    ALT 0 - 44 U/L 12  26 December 2023     12/18/2023 Surgical Pathology Surgical Pathology Report  Clinical History: colon cancer  FINAL MICROSCOPIC DIAGNOSIS:  A. ASCENDING AND TRANSVERSE COLON, RESECTION: - Invasive mucinous colorectal adenocarcinoma. - Seventeen lymph nodes  negative for malignancy (0/17). - See cancer summary below. - Unremarkable proximal small intestinal and distal colonic resection margins. - Tattoo ink. - Unremarkable appendix.  CASE SUMMARY: COLON AND RECTUM RESECTION Standard(s): AJCC 8  SPECIMEN Procedure: Partial colectomy  TUMOR Tumor Site: Transverse colon Histologic Type: Mucinous adenocarcinoma Histologic Grade: G3, poorly differentiated Tumor Size: Greatest dimension: 7.5 cm Tumor Extent: Invades through muscularis propria into the pericolic tissue Macroscopic Tumor Perforation: Not identified Lymphatic and/or Vascular Invasion: Not identified Perineural Invasion: Not identified Tumor Budding Score: Low (0-4) Treatment Effect: No known presurgical therapy  MARGINS Margin Status for Invasive Carcinoma: All margins negative for invasive carcinoma Margin Status for Non-Invasive Tumor: All margins negative for high-grade dysplasia/intramucosal carcinoma and low-grade dysplasia  REGIONAL LYMPH NODES Regional Lymph Node Status: All regional lymph nodes negative for tumor      Number of Lymph Nodes Examined: 17 Tumor Deposits: 0  DISTANT METASTASES Distant Site(s) Involved, if applicable: Not applicable  PATHOLOGIC STAGE CLASSIFICATION (pTNM, AJCC 8th Edition): Modified Classification: Not applicable pT3      T suffix: Not applicable pN0 pM - Not applicable  ADDITIONAL FINDINGS: None  SPECIAL STUDIES Results of immunohistochemistry for MMR (mismatch repair proteins) will be resulted in an addendum. (  v4.4.0.1)  GROSS DESCRIPTION:  Specimen: Received fresh is ascending and transverse colon Specimen integrity: Intact Specimen length: Small intestine = 9 cm in length by 1.5 cm in diameter, cecum = 6 cm in length by 3 cm in diameter, appendix = 8.5 cm in length by 0.5 cm in diameter, large intestine = 42 cm in length by up to 3.2 cm in diameter Tumor location: Mid transverse Tumor size: The tumor  measures 7.5 x 5.6 cm and consists of a tan-pink lesion with heaped up borders and a central area of ulceration. Percent of bowel circumference involved: 100% Tumor distance to margins:                      Proximal: 44 cm                      Distal: 3.5 cm                      Radial: 9.5 cm Macroscopic extent of tumor invasion: The tumor grossly extends into the serosa and may involve the pericolic fat. Total presumed lymph nodes: 22 lymph node candidates are identified ranging from 0.1 to 1.0 cm. Extramural satellite tumor nodules: None grossly identified Mucosal polyp(s): None grossly identified Additional findings: The appendix is sectioned revealing a pinpoint, patent lumen, without distinct lesions or fecaliths.  The mucosa is tan and glistening. Block summary: A1 = proximal margin A2 = distal margin A3 = radial margin closest to tumor A4-A6 = tumor to deepest extent A7 = tumor to proximal bowel A8 = tumor to distal bowel A9 = appendix A10-A13 = 16 lymph node candidates, whole (4 per block) A14 = 2 lymph node candidates, whole A15 = 2 lymph node candidates, whole A16 = 1 lymph node candidate, bisected A17 = 1 lymph node candidate, trisected (KW, 12/19/2023)    Final Diagnosis performed by Rexene Daily, MD.   Electronically signed 12/20/2023 Technical component performed at [facility], [address] [place], [place] [address].  Professional component performed at [facility]. [address], [place], [place] [address]  Immunohistochemistry Technical component (if applicable) was performed at [facility] [employer]. [address], [address], [place], [place] [address].  IMMUNOHISTOCHEMISTRY DISCLAIMER (if applicable): Some of these immunohistochemical stains may have been developed and the performance characteristics determine by [facility]. Some may not have been cleared or approved by the U.S.  Food and Drug Administration. The FDA has determined that such clearance or approval is not necessary. This test is used for clinical purposes. It should not be regarded as investigational or for research. This laboratory is certified under the Clinical Laboratory Improvement Amendments of 1988 (CLIA-88) as qualified to perform high complexity clinical laboratory testing.  The controls stained appropriately.   11/07/2023 CT CHEST ABDOMEN PELVIS W CONTRAST  CLINICAL DATA:  Colon cancer staging. Also history of non-Hodgkin's lymphoma.   TECHNIQUE: Multidetector CT imaging of the chest, abdomen and pelvis was performed following the standard protocol during bolus administration of intravenous contrast.   RADIATION DOSE REDUCTION: This exam was performed according to the departmental dose-optimization program which includes automated exposure control, adjustment of the mA and/or kV according to patient size and/or use of iterative reconstruction technique.   CONTRAST:  OMNIPAQUE  IOHEXOL  300 MG/ML  SOLN   COMPARISON:  Chest CT dated 06/04/2023 and CT chest abdomen pelvis dated 10/30/2022.   FINDINGS: CT CHEST FINDINGS  Cardiovascular: There is no cardiomegaly or pericardial effusion. There is coronary vascular calcification. Mild atherosclerotic calcification of the thoracic aorta. No aneurysmal dilatation or dissection. The origins of the great vessels of the aortic arch and the central pulmonary arteries are patent.   Mediastinum/Nodes: No hilar or mediastinal adenopathy. The esophagus and the thyroid  gland are grossly unremarkable. No mediastinal fluid collection.   Lungs/Pleura: Mild bibasilar subpleural interstitial coarsening likely fibrosis. No focal consolidation, pleural effusion, pneumothorax. The central airways are patent.   Musculoskeletal: Osteopenia with degenerative changes of the spine. No acute osseous pathology. Thoracic spine stimulator.   CT  ABDOMEN PELVIS FINDINGS   No intra-abdominal free air or free fluid.   Hepatobiliary: Faint enhancing focus in the dome of the liver (54/2) is not characterized but may represent a small flash filling hemangioma. The liver is otherwise unremarkable. Mild biliary dilatation, post cholecystectomy. No retained calcified stone noted in the central CBD.   Pancreas: Unremarkable. No pancreatic ductal dilatation or surrounding inflammatory changes.   Spleen: Normal in size without focal abnormality.   Adrenals/Urinary Tract: The left adrenal gland is unremarkable. There is an 11 mm indeterminate right adrenal nodule, stable since 2024, likely a benign adenoma. There is mild fullness of the renal collecting systems bilaterally similar to prior CT of 2024. There is symmetric enhancement and excretion of contrast by both kidneys. There is infiltration of the fat plane adjacent to the midportion of the ureters secondary to retroperitoneal adenopathy likely causing a degree of stricture or extrinsic compression and the ureters resulting in fullness of the renal collecting systems. The urinary bladder is unremarkable.   Stomach/Bowel: Mild sigmoid diverticulosis. There is a 5 cm long segment wall thickening of the mid transverse colon consistent with malignancy. There is no bowel obstruction the appendix is normal.   Vascular/Lymphatic: Advanced aortoiliac atherosclerotic disease. The IVC is unremarkable. No portal venous gas. Retroperitoneal adenopathy with infiltrative encasement of the infrarenal abdominal aorta as seen on the CT of 2024. The left para-aortic tissue measures approximately 2.3 cm in thickness similar to prior CT. Right iliac chain lymph node measures 9 mm short axis.   Reproductive: The uterus is grossly unremarkable. No suspicious adnexal masses.   Other: None   Musculoskeletal: Osteopenia with degenerative changes of the spine. L4-L5 posterior fusion and disc  spacer. No acute osseous pathology.   IMPRESSION: 1. No acute intrathoracic pathology.   2. A 5 cm long segment of wall thickening of the mid transverse colon consistent with malignancy. No bowel obstruction.   3. Retroperitoneal adenopathy with infiltrative encasement of the infrarenal abdominal aorta as seen on the CT of 2024.   4. Mild fullness of the renal collecting systems bilaterally similar to prior CT of 2024.   5.  Aortic Atherosclerosis (ICD10-I70.0).    10/10/2023 Surgical Pathology FINAL DIAGNOSIS       1. Stomach, biopsy, cbx :      - BENIGN GASTRIC MUCOSA WITH NO SPECIFIC PATHOLOGIC CHANGE.      - NO EVIDENCE OF H. PYLORI ON H&E STAIN.       2. Cecum Polyp, cold snare :      - TUBULAR ADENOMA.       3. Ascending  Colon Polyp, cold snare :      - TUBULAR ADENOMA.       4. Transverse Colon Polyp, cold snare x3 :      - FRAGMENTS OF TUBULAR ADENOMA.       5. Descending Colon Polyp, cold  snare :      - TUBULAR ADENOMA.       6. Descending Colon Biopsy, cbx mass 60 cm :      - ADENOCARCINOMA, WELL TO MODERATELY DIFFERENTIATED (SEE COMMENT).       Diagnosis Note : This specimen (part 6) was seen in peer review on 10/11/2023 by      Dr.Rubinas, who concurs with the interpretation.      This result was reported to Dr. Unk on 10/11/2023.      ELECTRONIC SIGNATURE : Coronel Md, Misti, Sports Administrator, International Aid/development Worker  MICROSCOPIC DESCRIPTION  CASE COMMENTS STAINS USED IN DIAGNOSIS: H&E H&E H&E H&E H&E H&E H&E-2    CLINICAL HISTORY  SPECIMEN(S) OBTAINED 1. Stomach, biopsy, Cbx 2. Cecum Polyp, Cold Snare 3. Ascending  Colon Polyp, Cold Snare 4. Transverse Colon Polyp, Cold Snare X3 5. Descending Colon Polyp, Cold Snare 6. Descending Colon Biopsy, Cbx Mass 60 Cm  SPECIMEN COMMENTS: 1. Anemia SPECIMEN CLINICAL INFORMATION: 1. IDA.EGD;  gastric erythema    Gross Description 1. Received in formalin are tan, soft tissue fragments that are  submitted in toto.Number:  four ,  Size:  0.1 cm smallest to 0.2 cm largest, (1 B) 2. Received in formalin are tan, soft tissue fragments that are submitted in toto.Number:  two  Size:  each 0.2 cm,  (1 B) 3. Received in formalin are tan, soft tissue fragments that are submitted in toto.Number:  multiple ,  Size:  0.5 x 0.5 x 0.1 cm in aggregate    (1 B) 4. In formalin are multiple irregular and polypoid tan soft tissues, 0.7 x 0.6 x 0.2  cm in aggregate.Submitted in one block. 5. Received in formalin are tan, soft tissue fragments that are submitted in toto.Number:  multiple  Size:  0.7 x 0.6 x 0.2 cm  (1B) 6. Received in formalin are tan, soft tissue fragments that are submitted in toto.Number:  multiple ,  Size:  0.8 x 0.7 x 0.2 cm cm.Submitted  in toto in one block.(SSW:kh 10/10/23)    IHC EXPRESSION RESULTS   TEST           RESULT  MLH1:          LOSS OF NUCLEAR EXPRESSION  MSH2:          Preserved nuclear expression  MSH6:          Preserved nuclear expression  PMS2:          LOSS OF NUCLEAR EXPRESSION     RADIOGRAPHIC STUDIES: I have personally reviewed the radiological images as listed and agreed with the findings in the report. No results found.  ASSESSMENT & PLAN:  84 y.o. Caucasian female with multiple chronic medical comorbidities as noted above with    #1 h/o Stage IVB Low grade Follicular Lymphoma diagnosed in 2018 with extensive retroperitoneal lymphadenopathy and right inguinal lymphadenopathy as well as osseous involvement of the sacrum. LDH level within normal limits.  S/p 5 cycles of BR (Bendamustine  70mg /m2)   PET CT scan after 5 cycles of Bendamustine  and Rituxan  done on 09/06/2016 shows no residual metabolic active disease and no evidence of disease progression.   CT C/A/P on 05/20/17 reveals no radiographic evidence of disease progression.    02/19/18 CT C/A/P revealed Stable mild retroperitoneal adenopathy, compatible with treated lymphoma. No new or  progressive adenopathy. No new sites of disease. Normal size spleen. 2. Three-vessel coronary atherosclerosis. 3. Aortic Atherosclerosis. CT chest abdomen pelvis 10/30/2022 showed some retroperitoneal and  pelvic lymphadenopathy suggesting a symptomatic recurrence of her low-grade lymphoma.   #2 Newly diagnosed descending colon ?  Transverse colon on CT adenocarcinoma well to moderately differentiated.  On surgical pathology Stage IIA MLH1:          LOSS OF NUCLEAR EXPRESSION  MSH2:          Preserved nuclear expression  MSH6:          Preserved nuclear expression  PMS2:          LOSS OF NUCLEAR EXPRESSION    #3 iron  deficiency related to GI bleeding from her newly diagnosed colon cancer and likely angioectasia which has been lasered.   PLAN: - Discussed lab results on 03/24/2024 in detail with patient:  CBC showed WBC of 8.9K decreased from 14.9K, Hemoglobin of 13.6, and PLTs of 237K. CMP stable overall.  Iron  Panel and Ferritin: Iron % 24 and Ferritin 210 LDH 191  CEA: 2.85  - Explained the results of her molecular pathology results in October 2025:  High MSI  FOLLOW-UP in *** for labs and follow-up with Dr. Onesimo.  The total time spent in the appointment was *** minutes* .  All of the patient's questions were answered and the patient knows to call the clinic with any problems, questions, or concerns.  Emaline Onesimo MD MS AAHIVMS [facility] Hematology/Oncology Physician [facility]  *Total Encounter Time as defined by the Centers for Medicare and Medicaid Services includes, in addition to the face-to-face time of a patient visit (documented in the note above) non-face-to-face time: obtaining and reviewing outside history, ordering and reviewing medications, tests or procedures, care coordination (communications with other health care professionals or caregivers) and documentation in the medical record.  I,Emily Lagle,acting as a neurosurgeon for Emaline Onesimo, MD.,have documented all  relevant documentation on the behalf of Emaline Onesimo, MD,as directed by  Emaline Onesimo, MD while in the presence of Emaline Onesimo, MD.  I have reviewed the above documentation for accuracy and completeness, and I agree with the above.  Emaline Onesimo, MD    [1]  Social History Tobacco Use   Smoking status: Every Day    Current packs/day: 0.50    Average packs/day: 0.5 packs/day for 60.0 years (30.0 ttl pk-yrs)    Types: Cigarettes   Smokeless tobacco: Never   Tobacco comments:    1/2 ppd--04/23/2023  Vaping Use   Vaping status: Never Used  Substance Use Topics   Alcohol use: Yes    Comment: social - mixed drink   Drug use: No   "

## 2024-04-02 ENCOUNTER — Other Ambulatory Visit: Payer: Self-pay

## 2024-04-02 DIAGNOSIS — C189 Malignant neoplasm of colon, unspecified: Secondary | ICD-10-CM

## 2024-04-03 ENCOUNTER — Other Ambulatory Visit: Payer: Self-pay | Admitting: Family

## 2024-04-03 DIAGNOSIS — I10 Essential (primary) hypertension: Secondary | ICD-10-CM

## 2024-04-03 DIAGNOSIS — I700 Atherosclerosis of aorta: Secondary | ICD-10-CM

## 2024-04-12 ENCOUNTER — Other Ambulatory Visit: Payer: Self-pay | Admitting: Family

## 2024-04-12 DIAGNOSIS — M79672 Pain in left foot: Secondary | ICD-10-CM

## 2024-04-14 NOTE — Telephone Encounter (Signed)
 Spoke to pt she states that she is taking the 100 mg Gabapentin  2 times daily, suppose to be taking it 3 times daily but she states it doesn't help but she would like you to increase the dosage when you send in refill

## 2024-04-15 ENCOUNTER — Other Ambulatory Visit: Payer: Self-pay

## 2024-04-16 ENCOUNTER — Ambulatory Visit: Admitting: Family

## 2024-05-04 ENCOUNTER — Ambulatory Visit: Payer: Self-pay | Admitting: Family

## 2024-05-11 ENCOUNTER — Inpatient Hospital Stay: Payer: Self-pay

## 2024-05-11 ENCOUNTER — Inpatient Hospital Stay: Payer: Self-pay | Attending: Hematology | Admitting: Genetic Counselor

## 2024-06-30 ENCOUNTER — Inpatient Hospital Stay: Payer: Self-pay | Attending: Hematology

## 2024-06-30 ENCOUNTER — Inpatient Hospital Stay: Payer: Self-pay | Admitting: Hematology

## 2024-10-05 ENCOUNTER — Ambulatory Visit
